# Patient Record
Sex: Male | Born: 1935 | Race: White | Hispanic: No | Marital: Married | Smoking: Former smoker
Health system: Southern US, Community
[De-identification: ages and names within clinical notes are randomized; demographics above are authoritative.]

## PROBLEM LIST (undated history)

## (undated) DIAGNOSIS — E039 Hypothyroidism, unspecified: Secondary | ICD-10-CM

## (undated) DIAGNOSIS — I509 Heart failure, unspecified: Secondary | ICD-10-CM

## (undated) DIAGNOSIS — I498 Other specified cardiac arrhythmias: Secondary | ICD-10-CM

## (undated) DIAGNOSIS — I951 Orthostatic hypotension: Secondary | ICD-10-CM

## (undated) DIAGNOSIS — J189 Pneumonia, unspecified organism: Secondary | ICD-10-CM

## (undated) DIAGNOSIS — I10 Essential (primary) hypertension: Secondary | ICD-10-CM

## (undated) DIAGNOSIS — E0590 Thyrotoxicosis, unspecified without thyrotoxic crisis or storm: Secondary | ICD-10-CM

## (undated) DIAGNOSIS — N471 Phimosis: Secondary | ICD-10-CM

## (undated) DIAGNOSIS — Z7901 Long term (current) use of anticoagulants: Secondary | ICD-10-CM

## (undated) DIAGNOSIS — H409 Unspecified glaucoma: Secondary | ICD-10-CM

## (undated) DIAGNOSIS — I4891 Unspecified atrial fibrillation: Secondary | ICD-10-CM

## (undated) DIAGNOSIS — M48061 Spinal stenosis, lumbar region without neurogenic claudication: Secondary | ICD-10-CM

## (undated) DIAGNOSIS — N3000 Acute cystitis without hematuria: Secondary | ICD-10-CM

## (undated) DIAGNOSIS — Z950 Presence of cardiac pacemaker: Secondary | ICD-10-CM

## (undated) DIAGNOSIS — N318 Other neuromuscular dysfunction of bladder: Secondary | ICD-10-CM

## (undated) DIAGNOSIS — N478 Other disorders of prepuce: Secondary | ICD-10-CM

## (undated) DIAGNOSIS — N189 Chronic kidney disease, unspecified: Secondary | ICD-10-CM

## (undated) DIAGNOSIS — J9811 Atelectasis: Secondary | ICD-10-CM

## (undated) DIAGNOSIS — G901 Familial dysautonomia [Riley-Day]: Secondary | ICD-10-CM

## (undated) DIAGNOSIS — M869 Osteomyelitis, unspecified: Secondary | ICD-10-CM

## (undated) DIAGNOSIS — G4730 Sleep apnea, unspecified: Secondary | ICD-10-CM

## (undated) HISTORY — DX: Unspecified atrial fibrillation: I48.91

## (undated) HISTORY — PX: APPENDECTOMY: SHX54

## (undated) HISTORY — DX: Acute cystitis without hematuria: N30.00

## (undated) HISTORY — DX: Hypothyroidism, unspecified: E03.9

## (undated) HISTORY — DX: Unspecified glaucoma: H40.9

## (undated) HISTORY — DX: Atelectasis: J98.11

## (undated) HISTORY — DX: Orthostatic hypotension: I95.1

## (undated) HISTORY — DX: Other neuromuscular dysfunction of bladder: N31.8

## (undated) HISTORY — DX: Phimosis: N47.1

## (undated) HISTORY — PX: TONSILLECTOMY AND ADENOIDECTOMY: SUR1326

## (undated) HISTORY — DX: Sleep apnea, unspecified: G47.30

## (undated) HISTORY — DX: Other disorders of prepuce: N47.8

## (undated) HISTORY — DX: Familial dysautonomia (riley-day): G90.1

## (undated) HISTORY — DX: Presence of cardiac pacemaker: Z95.0

## (undated) HISTORY — DX: Thyrotoxicosis, unspecified without thyrotoxic crisis or storm: E05.90

## (undated) HISTORY — PX: INGUINAL HERNIA REPAIR: SUR1180

## (undated) HISTORY — PX: TOE AMPUTATION: SHX809

## (undated) HISTORY — DX: Long term (current) use of anticoagulants: Z79.01

## (undated) HISTORY — DX: Essential (primary) hypertension: I10

---

## 1997-11-09 ENCOUNTER — Inpatient Hospital Stay (HOSPITAL_COMMUNITY): Admission: EM | Admit: 1997-11-09 | Discharge: 1997-11-12 | Payer: Self-pay | Admitting: Emergency Medicine

## 1998-01-13 ENCOUNTER — Other Ambulatory Visit: Admission: RE | Admit: 1998-01-13 | Discharge: 1998-01-13 | Payer: Self-pay | Admitting: Cardiology

## 2001-04-25 ENCOUNTER — Encounter: Payer: Self-pay | Admitting: Cardiology

## 2001-04-25 ENCOUNTER — Encounter: Admission: RE | Admit: 2001-04-25 | Discharge: 2001-04-25 | Payer: Self-pay | Admitting: Cardiology

## 2002-04-27 ENCOUNTER — Ambulatory Visit (HOSPITAL_BASED_OUTPATIENT_CLINIC_OR_DEPARTMENT_OTHER): Admission: RE | Admit: 2002-04-27 | Discharge: 2002-04-27 | Payer: Self-pay | Admitting: Internal Medicine

## 2002-07-02 ENCOUNTER — Inpatient Hospital Stay (HOSPITAL_COMMUNITY): Admission: AD | Admit: 2002-07-02 | Discharge: 2002-07-05 | Payer: Self-pay | Admitting: Internal Medicine

## 2002-07-03 ENCOUNTER — Encounter: Payer: Self-pay | Admitting: Internal Medicine

## 2002-07-03 HISTORY — PX: CARDIAC CATHETERIZATION: SHX172

## 2002-07-05 ENCOUNTER — Encounter: Payer: Self-pay | Admitting: Internal Medicine

## 2002-09-24 ENCOUNTER — Ambulatory Visit (HOSPITAL_COMMUNITY): Admission: RE | Admit: 2002-09-24 | Discharge: 2002-09-24 | Payer: Self-pay | Admitting: Internal Medicine

## 2002-12-03 ENCOUNTER — Emergency Department (HOSPITAL_COMMUNITY): Admission: EM | Admit: 2002-12-03 | Discharge: 2002-12-03 | Payer: Self-pay | Admitting: Emergency Medicine

## 2002-12-11 ENCOUNTER — Encounter: Payer: Self-pay | Admitting: Orthopaedic Surgery

## 2002-12-11 ENCOUNTER — Encounter: Admission: RE | Admit: 2002-12-11 | Discharge: 2002-12-11 | Payer: Self-pay | Admitting: Orthopaedic Surgery

## 2002-12-12 ENCOUNTER — Encounter: Admission: RE | Admit: 2002-12-12 | Discharge: 2002-12-12 | Payer: Self-pay | Admitting: Orthopaedic Surgery

## 2002-12-12 ENCOUNTER — Encounter: Payer: Self-pay | Admitting: Orthopaedic Surgery

## 2003-09-24 ENCOUNTER — Encounter (HOSPITAL_COMMUNITY): Admission: RE | Admit: 2003-09-24 | Discharge: 2003-12-23 | Payer: Self-pay | Admitting: Cardiology

## 2003-12-16 ENCOUNTER — Emergency Department (HOSPITAL_COMMUNITY): Admission: EM | Admit: 2003-12-16 | Discharge: 2003-12-17 | Payer: Self-pay | Admitting: Emergency Medicine

## 2003-12-19 ENCOUNTER — Ambulatory Visit (HOSPITAL_COMMUNITY): Admission: RE | Admit: 2003-12-19 | Discharge: 2003-12-19 | Payer: Self-pay | Admitting: Internal Medicine

## 2004-05-07 ENCOUNTER — Encounter: Admission: RE | Admit: 2004-05-07 | Discharge: 2004-05-07 | Payer: Self-pay | Admitting: Sports Medicine

## 2004-07-28 ENCOUNTER — Ambulatory Visit: Payer: Self-pay | Admitting: Internal Medicine

## 2004-10-06 ENCOUNTER — Encounter: Admission: RE | Admit: 2004-10-06 | Discharge: 2005-01-04 | Payer: Self-pay | Admitting: Otolaryngology

## 2004-11-19 ENCOUNTER — Ambulatory Visit: Payer: Self-pay | Admitting: Internal Medicine

## 2005-01-07 HISTORY — PX: CATARACT EXTRACTION W/ INTRAOCULAR LENS  IMPLANT, BILATERAL: SHX1307

## 2005-01-27 ENCOUNTER — Inpatient Hospital Stay (HOSPITAL_COMMUNITY): Admission: AD | Admit: 2005-01-27 | Discharge: 2005-01-29 | Payer: Self-pay | Admitting: Cardiology

## 2005-01-28 ENCOUNTER — Encounter (INDEPENDENT_AMBULATORY_CARE_PROVIDER_SITE_OTHER): Payer: Self-pay | Admitting: *Deleted

## 2005-05-25 ENCOUNTER — Ambulatory Visit: Payer: Self-pay | Admitting: Internal Medicine

## 2005-07-13 ENCOUNTER — Emergency Department (HOSPITAL_COMMUNITY): Admission: EM | Admit: 2005-07-13 | Discharge: 2005-07-13 | Payer: Self-pay | Admitting: Family Medicine

## 2005-08-27 ENCOUNTER — Ambulatory Visit: Payer: Self-pay | Admitting: Internal Medicine

## 2005-09-07 ENCOUNTER — Ambulatory Visit (HOSPITAL_BASED_OUTPATIENT_CLINIC_OR_DEPARTMENT_OTHER): Admission: RE | Admit: 2005-09-07 | Discharge: 2005-09-07 | Payer: Self-pay | Admitting: Internal Medicine

## 2005-09-07 ENCOUNTER — Ambulatory Visit: Payer: Self-pay | Admitting: Cardiology

## 2005-09-12 ENCOUNTER — Ambulatory Visit: Payer: Self-pay | Admitting: Internal Medicine

## 2005-09-27 ENCOUNTER — Ambulatory Visit: Payer: Self-pay | Admitting: Internal Medicine

## 2005-09-28 ENCOUNTER — Ambulatory Visit: Payer: Self-pay | Admitting: Internal Medicine

## 2005-10-26 ENCOUNTER — Ambulatory Visit: Payer: Self-pay | Admitting: Internal Medicine

## 2005-11-23 ENCOUNTER — Ambulatory Visit: Payer: Self-pay | Admitting: Internal Medicine

## 2005-12-07 ENCOUNTER — Ambulatory Visit: Payer: Self-pay | Admitting: Internal Medicine

## 2006-02-21 ENCOUNTER — Ambulatory Visit: Payer: Self-pay | Admitting: Internal Medicine

## 2006-04-07 ENCOUNTER — Ambulatory Visit (HOSPITAL_BASED_OUTPATIENT_CLINIC_OR_DEPARTMENT_OTHER): Admission: RE | Admit: 2006-04-07 | Discharge: 2006-04-07 | Payer: Self-pay | Admitting: Orthopedic Surgery

## 2006-04-07 HISTORY — PX: ELBOW BURSA SURGERY: SHX615

## 2006-06-28 ENCOUNTER — Ambulatory Visit: Payer: Self-pay | Admitting: Internal Medicine

## 2006-07-29 ENCOUNTER — Encounter: Admission: RE | Admit: 2006-07-29 | Discharge: 2006-09-12 | Payer: Self-pay | Admitting: Cardiology

## 2006-12-26 ENCOUNTER — Ambulatory Visit: Payer: Self-pay | Admitting: Internal Medicine

## 2007-04-20 ENCOUNTER — Ambulatory Visit: Payer: Self-pay | Admitting: Internal Medicine

## 2007-07-26 ENCOUNTER — Ambulatory Visit (HOSPITAL_COMMUNITY): Admission: RE | Admit: 2007-07-26 | Discharge: 2007-07-27 | Payer: Self-pay | Admitting: Orthopedic Surgery

## 2007-07-26 ENCOUNTER — Encounter (INDEPENDENT_AMBULATORY_CARE_PROVIDER_SITE_OTHER): Payer: Self-pay | Admitting: Orthopedic Surgery

## 2007-11-24 ENCOUNTER — Ambulatory Visit: Payer: Self-pay | Admitting: Internal Medicine

## 2007-11-24 DIAGNOSIS — J209 Acute bronchitis, unspecified: Secondary | ICD-10-CM

## 2007-11-24 DIAGNOSIS — J9819 Other pulmonary collapse: Secondary | ICD-10-CM

## 2007-11-24 DIAGNOSIS — G4733 Obstructive sleep apnea (adult) (pediatric): Secondary | ICD-10-CM | POA: Insufficient documentation

## 2007-11-28 ENCOUNTER — Encounter: Payer: Self-pay | Admitting: Internal Medicine

## 2007-12-07 ENCOUNTER — Ambulatory Visit: Payer: Self-pay | Admitting: Internal Medicine

## 2008-01-25 ENCOUNTER — Ambulatory Visit (HOSPITAL_COMMUNITY): Admission: RE | Admit: 2008-01-25 | Discharge: 2008-01-25 | Payer: Self-pay | Admitting: Neurological Surgery

## 2008-06-05 ENCOUNTER — Ambulatory Visit: Payer: Self-pay

## 2008-11-28 ENCOUNTER — Encounter (INDEPENDENT_AMBULATORY_CARE_PROVIDER_SITE_OTHER): Payer: Self-pay | Admitting: *Deleted

## 2008-12-03 DIAGNOSIS — I4891 Unspecified atrial fibrillation: Secondary | ICD-10-CM | POA: Insufficient documentation

## 2008-12-03 DIAGNOSIS — Q078 Other specified congenital malformations of nervous system: Secondary | ICD-10-CM

## 2008-12-03 DIAGNOSIS — I959 Hypotension, unspecified: Secondary | ICD-10-CM | POA: Insufficient documentation

## 2008-12-03 DIAGNOSIS — I495 Sick sinus syndrome: Secondary | ICD-10-CM | POA: Insufficient documentation

## 2008-12-03 DIAGNOSIS — Z950 Presence of cardiac pacemaker: Secondary | ICD-10-CM

## 2008-12-31 ENCOUNTER — Ambulatory Visit: Payer: Self-pay | Admitting: Internal Medicine

## 2009-05-30 ENCOUNTER — Telehealth: Payer: Self-pay | Admitting: Internal Medicine

## 2009-06-12 ENCOUNTER — Encounter: Admission: RE | Admit: 2009-06-12 | Discharge: 2009-06-12 | Payer: Self-pay | Admitting: Specialist

## 2009-06-17 ENCOUNTER — Ambulatory Visit: Payer: Self-pay | Admitting: Internal Medicine

## 2009-06-23 ENCOUNTER — Telehealth: Payer: Self-pay | Admitting: Internal Medicine

## 2010-02-03 ENCOUNTER — Ambulatory Visit: Payer: Self-pay | Admitting: Internal Medicine

## 2010-02-04 ENCOUNTER — Encounter: Payer: Self-pay | Admitting: Internal Medicine

## 2010-03-30 ENCOUNTER — Ambulatory Visit: Payer: Self-pay | Admitting: Cardiology

## 2010-04-14 ENCOUNTER — Ambulatory Visit (HOSPITAL_COMMUNITY): Admission: RE | Admit: 2010-04-14 | Discharge: 2010-04-14 | Payer: Self-pay | Admitting: Neurological Surgery

## 2010-05-05 ENCOUNTER — Ambulatory Visit: Payer: Self-pay | Admitting: Cardiology

## 2010-05-19 ENCOUNTER — Ambulatory Visit: Payer: Self-pay | Admitting: Cardiology

## 2010-06-03 ENCOUNTER — Ambulatory Visit: Payer: Self-pay | Admitting: Cardiology

## 2010-06-05 ENCOUNTER — Encounter (INDEPENDENT_AMBULATORY_CARE_PROVIDER_SITE_OTHER): Payer: Self-pay | Admitting: *Deleted

## 2010-06-17 ENCOUNTER — Ambulatory Visit: Payer: Self-pay | Admitting: Cardiology

## 2010-07-06 ENCOUNTER — Ambulatory Visit: Payer: Self-pay | Admitting: Cardiology

## 2010-07-13 ENCOUNTER — Ambulatory Visit: Payer: Self-pay | Admitting: Internal Medicine

## 2010-07-14 ENCOUNTER — Emergency Department (HOSPITAL_COMMUNITY)
Admission: EM | Admit: 2010-07-14 | Discharge: 2010-07-14 | Payer: Self-pay | Source: Home / Self Care | Admitting: Emergency Medicine

## 2010-09-08 ENCOUNTER — Ambulatory Visit: Payer: Self-pay | Admitting: Cardiology

## 2010-09-08 NOTE — Procedures (Signed)
Summary: 6 month pacer ck   Current Medications (verified): 1)  Coumadin .... As Directed 2)  Synthroid 175 Mcg  Tabs (Levothyroxine Sodium) .... Take 1 By Mouth Once Daily 3)  Tylenol Extra Strength 500 Mg  Tabs (Acetaminophen) .... As Needed 4)  Midodrine Hcl 10 Mg Tabs (Midodrine Hcl) .... Take One Tablet Four Times A Day 5)  Fludrocortisone Acetate 0.1 Mg Tabs (Fludrocortisone Acetate) .... Take One Tablet Two Times A Day On Mon, Wed, Fri.  Take One Tablet All Other Days 6)  Lyrica 75 Mg Caps (Pregabalin) .... Take One Capsule Once Daily 7)  Digoxin 0.125 Mg Tabs (Digoxin) .... Take 1 Tablet By Mouth Once Daily  Allergies (verified): No Known Drug Allergies   PPM Specifications Following MD:  Sherryl Manges, MD     PPM Vendor:  Pacesetter     PPM Model Number:  [id number]     PPM Serial Number:  [id number] DOI:  07/04/2002     PPM Implanting MD:  Sherryl Manges, MD  Lead 1    Location: RA     DOI: 07/04/2002     Model #: 1688TC     Serial #: [id number]     Status: active Lead 2    Location: RV     DOI: 07/04/2002     Model #: 1688TC     Serial #: [id number]     Status: active   Indications:  Tachy Brady Syndrome; PAF   PPM Follow Up Battery Voltage:  2.67 V     Battery Est. Longevity:  0.75-1.25 yrs     Pacer Dependent:  No       PPM Device Measurements Atrium  Amplitude: 2.6 mV, Impedance: 407 ohms, Threshold: 1.75 V at 0.5 msec Right Ventricle  Amplitude: 16.6 mV, Impedance: 382 ohms, Threshold: 0.625 V at 0.5 msec  Episodes MS Episodes:  1     Percent Mode Switch:  >99%     Coumadin:  Yes Ventricular High Rate:  0     Atrial Pacing:  50%     Ventricular Pacing:  100%  Parameters Mode:  DDDR     Lower Rate Limit:  70     Upper Rate Limit:  120 Paced AV Delay:  300     Sensed AV Delay:  225 Next Cardiology Appt Due:  01/08/2011 Tech Comments:  PT IN AF >99% OF TIME. +COUMADIN. NORMAL DEVICE FUNCTION.  CHANGED ER SENSITIVITY FROM 4.7 TO 2.80mV.  ROV IN 6 MTHS W/SK. Vella Kohler   July 13, 2010 3:37 PM

## 2010-09-08 NOTE — Assessment & Plan Note (Signed)
Summary: pc2 per pt call/lg   Referring Provider:  Cassell Clement  CC:  pc2.  History of Present Illness: Mr. 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  He has paroxysmal fibrillation and is holding sinus rhythm. He was previously on amiodarone but we discontinued this at his last visit because of the concerns that might be contributing to his autonomic neuropathy. in the interim he is also seeing Dr. love who is further up titrated his ProAmatine and Florinef with significant overall improvement     Current Medications (verified): 1)  Coumadin .... As Directed 2)  Synthroid 175 Mcg  Tabs (Levothyroxine Sodium) .... Take 1 By Mouth Once Daily 3)  Tylenol Extra Strength 500 Mg  Tabs (Acetaminophen) .... As Needed 4)  Midodrine Hcl 10 Mg Tabs (Midodrine Hcl) .... Take One Tablet Four Times A Day 5)  Fludrocortisone Acetate 0.1 Mg Tabs (Fludrocortisone Acetate) .... Take One Tablet Two Times A Day On Mon, Wed, Fri.  Take One Tablet All Other Days 6)  Lyrica 75 Mg Caps (Pregabalin) .... Take One Capsule Once Daily  Allergies (verified): No Known Drug Allergies  Past History:  Past Medical History: Last updated: 12/03/2008 PACEMAKER (ICD-V45.[name]01) ATRIAL FIBRILLATION (ICD-427.31) SICK SINUS/ TACHY-BRADY SYNDROME (ICD-427.81) HYPERTENSION, UNSPECIFIED (ICD-401.9) ORTHOSTATIC DIZZINESS (ICD-780.4) OBSTRUCTIVE SLEEP APNEA (ICD-327.23) ATELECTASIS (ICD-518.0) Hx of ASTHMATIC BRONCHITIS, ACUTE (ICD-466.0) * REM BEHAVIOR DISORDER DYSAUTONOMIA (ICD-742.8)    Past Surgical History: Last updated: 12/03/2008 eye surgery 01/2005 pacemaker - 07/04/02 [name] - tachy-brady syndrome with paroxysmal atrial fibrillation. Right foot second ray amputation  Family History: Last updated: 12/03/2008 Family History of Cancer:   Social History: Last updated: 12/03/2008 Patient states former smoker.  - 89 Retired  Married  Alcohol  Use - yes - very little  Vital Signs:  Patient profile:   77 year old male Height:      75 inches Weight:      227 pounds BMI:     28.48 Pulse rate:   71 / minute Pulse rhythm:   regular BP sitting:   130 / 78  (left arm) Cuff size:   regular  Vitals Entered By: Judithe Modest CMA (February 03, 2010 11:18 AM)  Physical Exam  General:  The patient was alert and oriented in no acute distress.Neck veins were flat, carotids were brisk. Lungs were clear. Heart sounds were irregular without murmurs or gallops. Abdomen was soft with active bowel sounds. There is no clubbing cyanosis or edema. He is walking today with lateral   PPM Specifications Following MD:  Sherryl Manges, MD     PPM Vendor:  Pacesetter     PPM Model Number:  [id number]     PPM Serial Number:  [id number] PPM DOI:  07/04/2002     PPM Implanting MD:  Sherryl Manges, MD  Lead 1    Location: RA     DOI: 07/04/2002     Model #: 1688TC     Serial #: [id number]     Status: active Lead 2    Location: RV     DOI: 07/04/2002     Model #: 1688TC     Serial #: [id number]     Status: active   Indications:  Tachy Brady Syndrome; PAF   PPM Follow Up Battery Voltage:  2.73 V     Battery Est. Longevity:  1-1.75 yrs     Pacer Dependent:  No       PPM Device Measurements Atrium  Amplitude: .9  mV, Impedance: 419 ohms,  Right Ventricle  Amplitude: 16.6 mV, Impedance: 363 ohms, Threshold: 0.625 V at 0.5 msec  Episodes MS Episodes:  6     Percent Mode Switch:  55%     Coumadin:  Yes Ventricular High Rate:  0     Atrial Pacing:  >99%     Ventricular Pacing:  1.0%  Parameters Mode:  DDDR     Lower Rate Limit:  70     Upper Rate Limit:  120 Paced AV Delay:  300     Sensed AV Delay:  225 Next Cardiology Appt Due:  07/09/2010 Tech Comments:  PT IN MODE SWITCH SINCE 09-28-09. + COUMADIN. NORMAL DEVICE FUNCTION.  CHANGES: A SENSITIVITY FROM 0.5 TO 0.61mV AND RV AMPLITUDE FROM 0.625 TO 0.875 V.  ROV IN 6 MTHS W/DEVICE CLINIC.  Vella Kohler  February 03, 2010 11:40  AM  Impression & Recommendations:  Problem # 1:  DYSAUTONOMIA (ICD-742.8) much improved off the amiodarone and with up titrated medications.. this is the best he has doenn in  a long time  Problem # 2:  ORTHOSTATIC DIZZINESS (ICD-780.4) as above  Problem # 3:  ATRIAL FIBRILLATION (ICD-427.31) paroxysmal and now persistent. He is tolerated well. Antiarrhythmic therapy will not be pursued  Problem # 4:  PACEMAKER (ICD-V45.[name]01) .Device parameters and data were reviewed and no changes were made

## 2010-09-08 NOTE — Miscellaneous (Signed)
Summary: dx correction  Clinical Lists Changes  Problems: Changed problem from PACEMAKER (ICD-V45.[name]01) to PACEMAKER, PERMANENT (ICD-V45.01)  changed the incorrect dx code to correct dx code Genella Mech  June 05, 2010 1:40 PM

## 2010-09-08 NOTE — Cardiovascular Report (Signed)
Summary: Office Visit   Office Visit   Imported By: Roderic Ovens 02/04/2010 12:26:49  _____________________________________________________________________  External Attachment:    Type:   Image     Comment:   External Document

## 2010-09-10 NOTE — Cardiovascular Report (Signed)
Summary: Office Visit   Office Visit   Imported By: Roderic Ovens 07/27/2010 16:20:28  _____________________________________________________________________  External Attachment:    Type:   Image     Comment:   External Document

## 2010-09-16 ENCOUNTER — Emergency Department (HOSPITAL_COMMUNITY)
Admission: EM | Admit: 2010-09-16 | Discharge: 2010-09-17 | Disposition: A | Discharge status: Home / Self Care | Payer: Medicare Other | Attending: Emergency Medicine | Admitting: Emergency Medicine

## 2010-09-16 ENCOUNTER — Emergency Department (HOSPITAL_COMMUNITY): Payer: Medicare Other

## 2010-09-16 DIAGNOSIS — R143 Flatulence: Secondary | ICD-10-CM | POA: Insufficient documentation

## 2010-09-16 DIAGNOSIS — I4891 Unspecified atrial fibrillation: Secondary | ICD-10-CM | POA: Insufficient documentation

## 2010-09-16 DIAGNOSIS — Z7901 Long term (current) use of anticoagulants: Secondary | ICD-10-CM | POA: Insufficient documentation

## 2010-09-16 DIAGNOSIS — N3943 Post-void dribbling: Secondary | ICD-10-CM | POA: Insufficient documentation

## 2010-09-16 DIAGNOSIS — H9190 Unspecified hearing loss, unspecified ear: Secondary | ICD-10-CM | POA: Insufficient documentation

## 2010-09-16 DIAGNOSIS — Z79899 Other long term (current) drug therapy: Secondary | ICD-10-CM | POA: Insufficient documentation

## 2010-09-16 DIAGNOSIS — R142 Eructation: Secondary | ICD-10-CM | POA: Insufficient documentation

## 2010-09-16 DIAGNOSIS — E039 Hypothyroidism, unspecified: Secondary | ICD-10-CM | POA: Insufficient documentation

## 2010-09-16 DIAGNOSIS — R109 Unspecified abdominal pain: Secondary | ICD-10-CM | POA: Insufficient documentation

## 2010-09-16 DIAGNOSIS — R339 Retention of urine, unspecified: Secondary | ICD-10-CM | POA: Insufficient documentation

## 2010-09-16 DIAGNOSIS — Z950 Presence of cardiac pacemaker: Secondary | ICD-10-CM | POA: Insufficient documentation

## 2010-09-16 DIAGNOSIS — R141 Gas pain: Secondary | ICD-10-CM | POA: Insufficient documentation

## 2010-09-16 DIAGNOSIS — K5900 Constipation, unspecified: Secondary | ICD-10-CM | POA: Insufficient documentation

## 2010-09-16 LAB — BASIC METABOLIC PANEL
BUN: 18 mg/dL (ref 6–23)
Creatinine, Ser: 1.32 mg/dL (ref 0.4–1.5)
GFR calc Af Amer: >60 mL/min (ref >60)

## 2010-09-16 LAB — DIFFERENTIAL
Basophils Relative: 0 % (ref 0–1)
Eosinophils Absolute: 0.1 10*3/uL (ref 0.0–0.7)
Lymphs Abs: 1.7 10*3/uL (ref 0.7–4.0)
Monocytes Absolute: 1.1 10*3/uL — ABNORMAL HIGH (ref 0.1–1.0)
Neutrophils Relative %: 73 % (ref 43–77)

## 2010-09-16 LAB — URINE MICROSCOPIC-ADD ON

## 2010-09-16 LAB — CBC
Hemoglobin: 14.7 g/dL (ref 13.0–17.0)
MCH: 30.7 pg (ref 26.0–34.0)
MCHC: 33.1 g/dL (ref 30.0–36.0)

## 2010-09-16 LAB — URINALYSIS, ROUTINE W REFLEX MICROSCOPIC
Leukocytes, UA: NEGATIVE
Specific Gravity, Urine: 1.015 (ref 1.005–1.030)

## 2010-09-16 LAB — PROTIME-INR: Prothrombin Time: 36.4 seconds — ABNORMAL HIGH (ref 11.6–15.2)

## 2010-09-18 ENCOUNTER — Telehealth (INDEPENDENT_AMBULATORY_CARE_PROVIDER_SITE_OTHER): Payer: Self-pay | Admitting: *Deleted

## 2010-09-24 NOTE — Progress Notes (Addendum)
Summary: Coumadin Question   Phone Note Call from Patient Call back at Home Phone [phone]   Reason for Call: Talk to Doctor Summary of Call: Returned call from pt's wife concerning rectal bleeding.  Pt states he had a BM today and began to have BRBPR.  It was not painful.  He has not had this before.  He is still bleeding some but says it has stopped almost completely.  Pt states he did strain during his BM.  Of note, he also had a foley catheter placed today but there is no sign of blood in his urine output.  Pts wife states he bled atleast 1/2 a cup.  Pt was not truly concerned as he says it is easing.  Pt's wife wanted to know about coumadin use.  He says his last INR was  ~2 weeks ago and was 2.  He is due for another check this week.  I have informed the wife that the pt can hold his coumadin dose tonight and restart tomorrow if bleeding has stopped.  If the bleeding has not stopped the pateitn should go to the ER for evaluation.  They voiced understanding and appreciated the call back.   Initial call taken by: Robbi Garter NP-PA,  September 18, 2010 7:51 PM

## 2010-09-30 ENCOUNTER — Other Ambulatory Visit (INDEPENDENT_AMBULATORY_CARE_PROVIDER_SITE_OTHER): Payer: Medicare Other

## 2010-09-30 DIAGNOSIS — Z7901 Long term (current) use of anticoagulants: Secondary | ICD-10-CM

## 2010-09-30 DIAGNOSIS — I4891 Unspecified atrial fibrillation: Secondary | ICD-10-CM

## 2010-10-15 ENCOUNTER — Encounter (INDEPENDENT_AMBULATORY_CARE_PROVIDER_SITE_OTHER): Payer: Medicare Other

## 2010-10-15 DIAGNOSIS — I4891 Unspecified atrial fibrillation: Secondary | ICD-10-CM

## 2010-10-15 DIAGNOSIS — Z7901 Long term (current) use of anticoagulants: Secondary | ICD-10-CM

## 2010-10-30 ENCOUNTER — Other Ambulatory Visit (INDEPENDENT_AMBULATORY_CARE_PROVIDER_SITE_OTHER): Payer: Medicare Other | Admitting: *Deleted

## 2010-10-30 DIAGNOSIS — I4891 Unspecified atrial fibrillation: Secondary | ICD-10-CM

## 2010-10-30 DIAGNOSIS — Z7901 Long term (current) use of anticoagulants: Secondary | ICD-10-CM

## 2010-10-30 LAB — POCT INR: INR: 3.2

## 2010-12-01 ENCOUNTER — Other Ambulatory Visit: Payer: Self-pay | Admitting: Cardiology

## 2010-12-01 DIAGNOSIS — R52 Pain, unspecified: Secondary | ICD-10-CM

## 2010-12-01 NOTE — Telephone Encounter (Signed)
Wanting a refill on vicodin 5/500 1 Q4hrs prn, 30 day supply sent to [facility].  Is this ok?  Any refills?

## 2010-12-01 NOTE — Telephone Encounter (Signed)
CALL PT REGARDING HIS MEDS, PLACED CHART IN BOX.

## 2010-12-02 MED ORDER — HYDROCODONE-ACETAMINOPHEN 5-500 MG PO TABS: 1.0000 | ORAL_TABLET | ORAL | Status: AC | PRN

## 2010-12-02 NOTE — Telephone Encounter (Signed)
OK to refill

## 2010-12-02 NOTE — Telephone Encounter (Signed)
Patient called requesting rx to be refilled. Called to [facility]

## 2010-12-04 ENCOUNTER — Ambulatory Visit (INDEPENDENT_AMBULATORY_CARE_PROVIDER_SITE_OTHER): Payer: Medicare Other | Admitting: *Deleted

## 2010-12-04 DIAGNOSIS — I4891 Unspecified atrial fibrillation: Secondary | ICD-10-CM

## 2010-12-04 LAB — POCT INR: INR: 2.5

## 2010-12-22 NOTE — Assessment & Plan Note (Signed)
[facility]                         ELECTROPHYSIOLOGY OFFICE NOTE   ACHILLES, NEVILLE                 MRN:          [id number]  DATE:12/07/2007                            DOB:          23-Oct-1920    HISTORY OF PRESENT ILLNESS:  Mr.  Marco Gregory is seen for tachybrady  syndrome.  He has paroxysmal atrial fibrillation for which he takes  Coumadin and is status post pacemaker implantation.  He also has  orthostatic intolerance which remains disabling at times and  unresponsive in his mind to therapeutic interventions, including  Midodrine and Florinef and attempts to use thigh-high stockings which  keep following down.   MEDICATIONS:  His medications are as noted above.  1. Amiodarone dose is 100 mg a day.  2. Coumadin.  3. Synthroid.   PHYSICAL EXAMINATION:  VITAL SIGNS:  Blood pressure 101/61 with pulse  was 74.  His weight was 233 which is down about 15 pounds over the last  year.  LUNGS:  Clear.  HEART:  Sounds were regular.  EXTREMITIES:  Without edema.   Interrogation of his St. Jude pulse generator demonstrates a P-wave of  two with impedance of 435 a threshold 0.7 and 0.5 with an R-wave of  16.6, impedance of 403, a threshold 0.625 at 0.5.  Battery voltage 2.75.  He is atrially paced 100% of the time and ventricularly paced not at  all.  There are no episodes of atrial fibrillation.   IMPRESSION:  1. Paroxysmal atrial fibrillation.  2. Bradycardia status post pacemaker implantation.  3. Amiodarone therapy for the above.  4. Dysautonomia with orthostatic intolerance.   Mr. Marco Gregory is having severe problems with back pain.  He continuous  to have problems with orthostatic intolerance.  His thigh-high stockings  are not working.  I suggest that he go back to the healthcare supply  store and see if they could replace him with waist-highs with a little  bit more pressure.  We will be glad to provide a script for that.   He is  also complaining of near syncope in the shower and I suggested  that he get a shower chair and use that.  The shower as well as well as  early morning might both be contributing to the poor tolerance.  I have  also told that he had increase his midodrine up to 20 mg up to 20 mg  three times a day if necessary for particularly bad symptoms.  It may  well also be that with his relatively low blood pressure, increasing  dose of his Florinef would be feasible.   We will see him again in his six months time.     Duke Salvia, MD, [facility]  Electronically Signed    SCK/MedQ  DD: 12/07/2007  DT: 12/07/2007  Job #: [id number]   cc:   Cassell Clement, M.D.

## 2010-12-22 NOTE — Letter (Signed)
April 20, 2007    Cassell Clement, M.D.  [address]  [place], [place] [address]   RE:  Marco, Gregory  MRN:  [id number]  /  DOB:  03/04/1921   Dear Elijah Birk:   Marco Gregory came in today.  As you know, he has his pacemaker  implanted for bradycardia.  He has the dysautonomia.  He has developed  severe limiting back pain and cervical spine pain.  He has gone to a  variety of orthopedists, chiropractors and I gather, also to the  [facility] and remains debilitated from this.   CURRENT MEDICATIONS:  1. Amiodarone 100 a day.  2. Synthroid 175 mcg.  3. Coumadin.   PHYSICAL EXAMINATION:  VITAL SIGNS:  Blood pressure was quite low at  86/60 with a pulse of 70.  LUNGS:  Clear.  HEART:  Regular.  EXTREMITIES:  1+ edema.   Interrogation of his St. Jude pacemaker demonstrates a P-wave of 1.6  with impedance of 455, threshold of 0.75 and 0.4.  R-wave of 16.6 with  impedance of 446 with threshold 0.625 and 0.5.  Battery voltage is 2.76.   IMPRESSION:  1. Tachycardia-bradycardia syndrome with paroxysmal atrial      fibrillation, holding sinus rhythm on amiodarone.  2. Status post pacemaker.  3. Orthostatic hypertension.  4. Severe back pain.   Marco Gregory has got a variety of significant symptoms in relation to  his back pain.  I told him there is data demonstrating the safety of MRI  in patient's with pacemakers, particularly limited to the TLS spine, and  so that if his orthopedic evaluation requires that to understand what  the cause of this limiting pain is, that could be pursued.   He asked me specifically about people who might be able to help him with  his back and I suggested he talk to you because I really do not have a  good sense of that.   If I can be of any assistance, please do not hesitate to contact me.    Sincerely,      Duke Salvia, MD, [facility]  Electronically Signed    SCK/MedQ  DD: 04/20/2007  DT: 04/21/2007  Job #:  [id number]   CC:    Cassell Clement, M.D.

## 2010-12-22 NOTE — Op Note (Signed)
NAME:  Marco Gregory, Marco Gregory          ACCOUNT NO.:  [id number]   MEDICAL RECORD NO.:  [id number]          PATIENT TYPE:  AMB   LOCATION:  SDS                          FACILITY:  [facility]   PHYSICIAN:  Nadara Mustard, MD     DATE OF BIRTH:  24-Aug-1920   DATE OF PROCEDURE:  07/26/2007  DATE OF DISCHARGE:                               OPERATIVE REPORT   PREOPERATIVE DIAGNOSIS:  Osteomyelitis, right foot second toe.   POSTOPERATIVE DIAGNOSIS:  Osteomyelitis, right foot second toe.   PROCEDURE:  Right foot second ray amputation.   SURGEON:  Nadara Mustard, M.D.   ANESTHESIA:  Right ankle block.   ESTIMATED BLOOD LOSS:  Minimal.   ANTIBIOTICS:  1 gram of Kefzol.   DRAINS:  None.   COMPLICATIONS:  None.   TOURNIQUET TIME:  None.   DISPOSITION:  To the PACU in stable condition.   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  No definite diagnosis of  diabetes.  He has a palpable pulse and has ulceration with a Wagner  grade 3 ulcer over the PIP joint of the right second toe with  radiographic findings of osteomyelitis of the PIP joint.  Due to failure  of conservative care, cellulitis, sausage digit, swelling of the toe,  the patient presents at this time for second ray amputation.  The risks  and benefits were discussed including infection, neurovascular injury,  nonhealing of the wound, need for higher level amputation.  The patient  states he understands and wishes to proceed at this time.   DESCRIPTION OF PROCEDURE:  The patient was brought to OR room 2 after  undergoing an ankle block.  After an adequate level of anesthesia was  obtained, the patient's right lower extremity was prepped using DuraPrep  and draped into a sterile field.  A dorsal incision was made in a racket  type incision to resect the metatarsal and toe in one block of tissue.  There were no plantar incisions made. The metatarsal was resected  at the  base of the metatarsal and the metatarsal and toe were resected in one  block of tissue.  The wound was irrigated with normal saline.  Electrocautery was used for hemostasis.  The wound was closed with a  modified vertical mattress suture.  There was  no tension on the skin. The wound was covered Adaptic, orthopedic  sponges, sterile Webril, and a Coban dressing.  The patient was then  taken to the PACU in stable condition.  Plan for 23 hour observation and  discharge to home in the morning.      Nadara Mustard, MD  Electronically Signed     MVD/MEDQ  D:  07/26/2007  T:  07/26/2007  Job:  [id number]

## 2010-12-24 ENCOUNTER — Other Ambulatory Visit: Payer: Self-pay | Admitting: Cardiology

## 2010-12-24 MED ORDER — MIDODRINE HCL 10 MG PO TABS: 10.0000 mg | ORAL_TABLET | Freq: Four times a day (QID) | ORAL | Status: DC

## 2010-12-24 NOTE — Telephone Encounter (Signed)
Called requesting refill.

## 2010-12-24 NOTE — Telephone Encounter (Signed)
Left message

## 2010-12-24 NOTE — Telephone Encounter (Signed)
CALL PT REGARDING MEDS. CHART IN BOX.

## 2010-12-25 NOTE — Assessment & Plan Note (Signed)
[facility]                             PULMONARY OFFICE NOTE   Marco Gregory, Marco Gregory                 MRN:          [id number]  DATE:12/27/2006                            DOB:          [birth year]    PROBLEM LIST:  1. Atelectasis.  2. Mild asthmatic bronchitis.  3. Obstructive sleep apnea.  4. Myasthenia.  5. 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  6. Atrial fibrillation/pacemaker.  7. Orthostasis and dizziness.   HISTORY:  A 6 month followup.  He continues with CPAP every night at 12  CWP and does not feel he has problems with that.  He talked again about  his primary symptom concern which is dizziness and also back pain for  which he was evaluated at [facility] with total improvement.  Breathing varies.  His main complaint is a persistent thick mucus in his  throat, sometimes yellow.  It has been more yellow in that last 2 weeks.  He does not really feel a significant nasal congestion but found that  aggressive use of a saline nasal spray made it better.  He took an  antibiotic in February which was of some help and also took Mucinex at  that time.  He thinks his last chest x-ray was 3 months ago with Dr.  Patty Sermons and fine.  I do not have access to that.  He denies  awareness of reflux, chest pain, fever, or wheeze.   MEDICATIONS:  1. Zetia 10 mg is listed as p.r.n.  2. Amiodarone 200 mg.  3. Coumadin.  4. Synthroid 175 mcg.  5. CPAP for 12 CWP.  6. Occasional Tylenol.  7. No medication allergy.   OBJECTIVE:  Weight 248 pounds which is steadily up from 244 pounds in  July 2007.  BP 104/68, pulse rapid and regular, listed as 150 by the  nurse, 112 by my count.  Room air saturation at rest 87%.  Work of  breathing is not labored.  He is not coughing.  Pharynx is somewhat  fleshy, a little blotchy and red, but I cannot really say it is acutely  irritated.  There is no visible drainage.  No stridor.  He is not  hoarse.   Work of breathing is not increased as he sits quietly.  I heard  a couple of little crackles but otherwise, chest was quiet without rales  or wheeze.  Heart sounds were approximately regular without murmur  heard.  There was no peripheral edema or cyanosis.   IMPRESSION:  His concern about thick, yellow phlegm found only in the  throat is nonspecific.  After some discussion of options and comparison  of postnasal drainage versus endobronchial sources, we have given a  course of doxycycline x7 days 100 mg, and I have instructed him in  active saline nasal lavage for trial.  He is scheduled to return with me  in 6 months, earlier p.r.n.  Keep appointments with Dr. Patty Sermons.     Clinton D. Maple Hudson, MD, [facility], FACP  Electronically Signed    CDY/MedQ  DD: 12/27/2006  DT: 12/27/2006  Job #: [id number]  cc:   Cassell Clement, M.D.

## 2010-12-25 NOTE — Op Note (Signed)
NAME:  Marco Gregory, Marco Gregory NO.:  [id number]   MEDICAL RECORD NO.:  [id number]                   PATIENT TYPE:  INP   LOCATION:  4740                                 FACILITY:  [facility]   PHYSICIAN:  Doylene Canning. [name], M.D. [facility]           DATE OF BIRTH:  May 06, 1921   DATE OF PROCEDURE:  07/04/2002  DATE OF DISCHARGE:                                 OPERATIVE REPORT   PROCEDURE PERFORMED:  Implantation of a dual chamber pacemaker.   INDICATIONS FOR PROCEDURE:  Symptomatic tachy-brady syndrome with paroxysmal  atrial fibrillation.   INTRODUCTION:  The patient is a very pleasant 77 year old man with a history  of paroxysmal atrial fibrillation which has been treated in the past with  quinidine.  In sinus rhythm, his heart rates were in the 40s.  In atrial  fibrillation his heart rates were rapid, typically between 100 and 150 beats  per minute.  He was subsequently admitted to the hospital and is referred  now for permanent pacemaker insertion.   DESCRIPTION OF PROCEDURE:  After informed consent was obtained, the patient  was taken to the diagnostic electrophysiology laboratory in a fasted state.  After the usual preparation and draping, intravenous fentanyl and Midazolam  were given for sedation.  A total of 30 cc of lidocaine was infiltrated into  the left infraclavicular region.  A 7 cm incision was carried out over this  region and electrocautery utilized to dissect down to the subpectoralis  fascia.  10 cc of contrast demonstrated a patent left subclavian vein.  It  was subsequently punctured times two and the St. Jude model 1688 TC 58 cm  active fixation lead was placed into the right ventricle and the St Jude  model 1688 TC 52 cm active fixation pacing lead was placed in the right  atrium.  The ventricular lead serial number was [id number] and the atrial lead  serial number was [id number].  Mapping was carried out in the right ventricle  and at the final site  near the RV apex R waves measured 14 mV and a pacing  threshold of 0.6 V at 0.5 ms once the lead was actively fixed.  The pacing  impedance was 664 ohms.  10 V pacing did not result in diaphragmatic  stimulation.  With the ventricular lead in satisfactory position, attention  was turned to the atrial lead.  Because of the patient's atrial fibrillation  mapping was carried out at a site where _______ waves were somewhat  increased in magnitude (1.7 to 3 mV), the lead was actively fixed.  AO  pacing demonstrated a pacing impedance in the right atrium of 492 ohms.  10  V pacing in the right atrium also did not result in diaphragmatic  stimulation.  With atrial and ventricular leads in satisfactory position  they were secured to the subpectoralis fascia with a figure-of-eight silk  suture.  In addition, the sewing sleeves were secured with  silk suture.  Electrocautery was utilized to connect the subcutaneous pocket.  Electrocautery was also utilized to ensure hemostasis.  Kanamycin irrigation  was utilized to irrigate the incision and the incision was closed with a  layer of 2-0 Vicryl, followed by a layer of 3-0 Vicryl followed by a layer  of 4-0 Vicryl.  Benzoin was painted on the skin and Steri-Strips were  applied and a pressure dressing placed and the patient returned to his room  in satisfactory condition.   COMPLICATIONS:  There were no immediate procedure complications.    RESULTS:  This demonstrates successful implantation of a St. Jude  Industrial/product designer) dual chamber pacemaker.  Of note, the pacemaker generator ws an  identity [id number] serial number [id number] in a patient with symptomatic tachy-  brady syndrome and paroxysmal atrial fibrillation.                                               Doylene Canning. [name], M.D. [facility]    GWT/MEDQ  D:  07/04/2002  T:  07/04/2002  Job:  [id number]   cc:   Cassell Clement, M.D.  [address], [address]  [place]  [place] [address]  Fax: [phone]    Duke Salvia, M.D. [facility]   [facility]

## 2010-12-25 NOTE — Op Note (Signed)
NAME:  Marco Gregory, Marco Gregory NO.:  [id number]   MEDICAL RECORD NO.:  [id number]                   PATIENT TYPE:  OIB   LOCATION:  2899                                 FACILITY:  [facility]   PHYSICIAN:  Duke Salvia, M.D.               DATE OF BIRTH:  1921-03-08   DATE OF PROCEDURE:  12/19/2003  DATE OF DISCHARGE:  12/19/2003                                 OPERATIVE REPORT   PREOPERATIVE DIAGNOSIS:  Atrial flutter - slow.   POSTOPERATIVE DIAGNOSIS:  Sinus rhythm.   PROCEDURE:  Direct current cardioversion and pacemaker interrogation.   SURGEON:  Duke Salvia, M.D.   DESCRIPTION OF PROCEDURE:  Following the obtainment of informed consent, the  patient was admitted to general anesthesia under Dr. Sampson Goon.  He  received a total of 175 mg of sodium pentothal.   Interrogation of his [employer]. Jude Identity ADXL pulse generator demonstrated  that he was in an atrial flutter with a cycle length of 300 msec.  Ventricular pacing via mode switch.  A 122 joule shock was delivered  synchronously and his atrial fibrillation terminated in atrial fibrillation  and restored an AV paced rhythm.   The patient tolerated the procedure without apparent complications.  The  patient's device was not further reprogrammed.                                               Duke Salvia, M.D.    SCK/MEDQ  D:  12/19/2003  T:  12/20/2003  Job:  [id number]   cc:   Cassell Clement, M.D.  [address], [address]  [place]  [place] [address]  Fax: [phone]   [facility]

## 2010-12-25 NOTE — Consult Note (Signed)
NAME:  AUDRA, BELLARD NO.:  [id number]   MEDICAL RECORD NO.:  [id number]          PATIENT TYPE:  INP   LOCATION:  3706                         FACILITY:  [facility]   PHYSICIAN:  Duke Salvia, M.D.  DATE OF BIRTH:  06-15-1921   DATE OF CONSULTATION:  01/29/2005  DATE OF DISCHARGE:                                   CONSULTATION   REASON FOR CONSULTATION:  Thank you for asking [place] to see Mr. Ameir Faria for profound hypotension.   HISTORY OF PRESENT ILLNESS:  Mr. Lizak is an 77 year old gentleman with  known history of paroxysmal atrial fibrillation on amiodarone with history  of concomitant bradycardia who is status post pacemaker implantation with a  St. Jude's device.  He recently underwent blepharoplasty for which his  Coumadin was held.   The patient presented to Dr. Yevonne Pax office the other day, having  awakened in the middle of the night, gone to the bathroom to urinate, and  having urinated, tried to get back to bed.  He became profoundly lightheaded  on return to bed and sat in the chair.  He was unable to get from the chair  and finally fell to the floor and crawled back to bed.  By the time he got  to Dr. Yevonne Pax office, he remained quite hypotensive with blood pressure  in the 80's.  Was assumed to be in atrial fibrillation with a rapid  ventricular response and was referred for hospitalization.   He has recently been referred to Dr. Rickard Patience because of his  orthostatic hypotension, both for further management of this as well as for  _________ of the spectrum in which is autonomic insufficiencies occur.  Now  he does have autonomic failure.  She put him on Mestinon 15 mg b.i.d.  He  has not felt that this helped much, and maybe has hurt a little bit.  He has  problems with constipation.  I do not have any other details of his workup.   PAST MEDICAL HISTORY:  1.  Nonobstructive disease and catheterization in November 2003 with  EF 60-      70%.  2.  Review of systems otherwise is noncontributory.   MEDICATIONS:  1.  Primatene 10 t.i.d.  2.  Synthroid 200 mcg.  3.  Coumadin 7.5 mg.  4.  IV amiodarone for re-bolus.   ALLERGIES:  No known drug allergies.   PHYSICAL EXAMINATION:  VITAL SIGNS:  Blood pressure has been quite labile  with records from 81-124 and moderately symptomatic.  HEENT:  Normocephalic, atraumatic.  NECK:  Veins were flat.  Carotids were brisk and full bilaterally without  bruits.  BACK:  Without kyphoscoliosis.  LUNGS:  Clear.  HEART:  Sounds irregular, but somewhat distant.  ABDOMEN:  Soft with active bowel sounds.  EXTREMITIES:  Without significant edema.  NEUROLOGICAL:  Grossly normal.  SKIN:  Warm and dry.   STUDIES:  Electrocardiogram with ventricular pacing at rates of 80,  intermixed with sinus rhythm.  Interrogation of his pacemaker demonstrated  atrial undersensing which was reprogrammed.  I also noticed that his atrial  fibrillation episodes  constitute only a small portion of his total time  since June 9.  I do not have data prior to that, but the occurrences since  June 9 seem to have started on June 21, i.e., night of admission with  numerous episodes associated with atrial undersensing and resumption of  counting.   IMPRESSION:  1.  Orthostatic hypotension, question context of autonomic failure.  2.  Paroxysmal atrial fibrillation, coincidental with the aggravation of his      hypotension.  3.  Bradycardia, status post St. Jude pacemaker implantation with atrial      undersensing, now corrected.  4.  Amiodarone per #2 with no evidence to suggest contributing to #1.  5.  Nonobstructive coronary artery disease.  6.  Hypothyroidism, currently on therapy with an elevated TSH.   DISCUSSION:  Mr. Asebedo has symptomatic hypotension that is orthostatic,  at least in large part.  I do not know what Dr. Odessa Fleming evaluation has  demonstrated due to the spectrum of  autonomic failure, but with his  constipation and his progressive symptoms, I am concerned that this is a  manifestation of a more systemic disorder.   In any case, his major problem now is blood pressure support, and I think  increasing his Midodrine up towards 15 then 20 mg t.i.d. makes sense.  In  addition, we will plan to resume his Mestinon now not at 15 but at 30 mg  b.i.d.  I wonder whether it is also not time to consider not withstanding  the summer, waist high support stockings.   Furthermore, his atrial fibrillation may be aggravating his hypotension, and  while I understand the TEE demonstrates some left atrial smoke, I will defer  to Dr. Reyes Ivan.  If this is a contraindication to proceed with DC  cardioversion, I think that maintenance of sinus rhythm is going to augment  his ability to be upright.   RECOMMENDATIONS:  Based on the above therefore:  1.  Increase his Midodrine to 15 mg t.i.d.  2.  Will give Mestinon 30 mg b.i.d.  3.  Strongly consider cardioversion at this point, unless contraindicated      based on Dr. Silva Bandy findings.  4.  Consider waist high support stockings.   Thank you for this consultation.       SCK/MEDQ  D:  01/29/2005  T:  01/29/2005  Job:  [id number]   cc:   Rickard Patience, M.D.  Urology, [facility]

## 2010-12-25 NOTE — Discharge Summary (Signed)
NAME:  Marco Gregory, Marco Gregory NO.:  [id number]   MEDICAL RECORD NO.:  [id number]          PATIENT TYPE:  INP   LOCATION:  3706                         FACILITY:  [facility]   PHYSICIAN:  Elmore Guise., M.D.DATE OF BIRTH:  1921-07-30   DATE OF ADMISSION:  01/27/2005  DATE OF DISCHARGE:  01/29/2005                                 DISCHARGE SUMMARY   DISCHARGE DIAGNOSES:  1.  Atrial fibrillation.  2.  Sick sinus syndrome (status post pacemaker implant).  3.  Chronic Coumadin therapy.  4.  Hypothyroidism.  5.  Orthostatic hypotension and presyncope.   HOSPITAL COURSE:  The patient is a very pleasant 77 year old white male who  was admitted on January 27, 2005, with fatigue, recurrent atrial fibrillation  with rapid ventricular response, as well as presyncope. On admission, he was  reloaded with IV amiodarone with significant improvement in his atrial  fibrillation. He underwent TEE on January 28, 2005, which showed smoke in the  left atrial left atrial appendage and descending aorta. He also had moderate  aortic regurgitation. Because of his sluggish flow, DC cardioversion was not  performed and is recommended after 3 weeks of therapeutic anticoagulation.  Due to his longstanding and moderate atrial regurgitation, chances are lower  that he would stay in sinus rhythm. However, the patient reports he is  feeling back to normal. He is ready to go home. His presyncope has  resolved and energy level has returned back to normal. He was discharged on  January 29, 2005, to continue the following medications:  1.  Midodrine 10 mg t.i.d.  2.  Mestinon once daily.  3.  Coumadin as directed.  4.  Amiodarone 200 mg b.i.d.  5.  Synthroid 0.25 mg daily.   He was also advised to wear support stockings to help with his venous return  and preload. He will return to Dr. Ronny Flurry at [facility] in  1 week for office visit as well as EKG and PT/INR. He is to notify the  office with  any problems or concerns.       TWK/MEDQ  D:  01/30/2005  T:  01/31/2005  Job:  [id number]

## 2010-12-25 NOTE — Cardiovascular Report (Signed)
   NAME:  Marco Gregory, Marco Gregory NO.:  [id number]   MEDICAL RECORD NO.:  [id number]                   PATIENT TYPE:  INP   LOCATION:  4740                                 FACILITY:  [facility]   PHYSICIAN:  Colleen Can. Deborah Chalk, M.D.            DATE OF BIRTH:  02-12-1921   DATE OF PROCEDURE:  07/03/2002  DATE OF DISCHARGE:                              CARDIAC CATHETERIZATION   HISTORY:  The patient has a history of atrial fibrillation and associated  chest pain with episode paroxysmal atrial fibrillation.  He is referred for  catheterization prior to initiation of Coumadin anticoagulation and  permanent pacing.   PROCEDURES:  Left heart catheterization with selective coronary angiography,  left ventricular angiography, and Perclose.   TYPE AND SITE OF ENTRY:  Percutaneous right femoral artery with Perclose.   CATHETERS:  A 6 French 4 curved Judkins right and left coronary catheters, 6  French pigtail ventriculographic catheter.   CONTRAST MATERIAL:  Omnipaque.   MEDICATIONS GIVEN PRIOR TO THE PROCEDURE:  Valium 10 mg p.o.   MEDICATIONS GIVEN DURING THE PROCEDURE:  Versed 1 mg IV.   COMMENTS:  The patient tolerated the procedure well.   HEMODYNAMIC DATA:  The aortic pressure was 84/60,  LV was 84/7-10.  There  was no aortic valve gradient noted on pullback.   ANGIOGRAPHIC DATA:  1. Left main coronary artery has minimal irregularities distally.  2. Left anterior descending:  Left anterior descending has 20% narrowing     proximally.  There are scattered irregularities in the left anterior     descending but no significant focal disease.  3. Left circumflex:  The left circumflex has somewhat scattered diffuse     irregularities.  There is no significant stenotic lesions.  4. Right coronary artery:  The right coronary artery is a dominant vessel.     It has minor irregularities.  It is normal.   LEFT VENTRICULAR ANGIOGRAM:  Left ventricular angiogram was  performed in the  RAO position.  Overall cardiac size and silhouette are normal.  The global  ejection fraction is estimated to be 60-70%.  Regional wall motion is  normal.  There is no mitral regurgitation noted.   OVERALL IMPRESSION:  1. Normal left ventricular function.  2. Very mild trivial three-vessel coronary atherosclerosis.    DISCUSSION:  It is felt that any chest pain the patient has is related to  his arrhythmia and not related to obstructive coronary disease. Overall his  prognosis should be excellent.                                                     Colleen Can. Deborah Chalk, M.D.    SNT/MEDQ  D:  07/03/2002  T:  07/03/2002  Job:  [id number]

## 2010-12-25 NOTE — Procedures (Signed)
NAME:  Marco Gregory, Marco Gregory NO.:  [id number]   MEDICAL RECORD NO.:  [id number]          PATIENT TYPE:  OUT   LOCATION:  SLEEP CENTER                 [facility]ACILITY[facility]  [facility]   PHYSICIAN:  Clinton D. Maple Hudson, M.D. DATE OF BIRTH:  03/31/1921   DATE OF STUDY:                              NOCTURNAL POLYSOMNOGRAM   REFERRING PHYSICIAN:  Dr. Jetty Duhamel.   DATE OF STUDY:  September 07, 2005.   INDICATION FOR STUDY:  Hypersomnia with sleep apnea.   EPWORTH SLEEPINESS SCORE:  17/24.   BMI:  32.   WEIGHT:  250 pounds.   HOME MEDICATIONS:  Amiodarone, Synthroid, Coumadin, Zetia, Tylenol.   SLEEP ARCHITECTURE:  Short total sleep time 238 minutes with fragmented  sleep. Delayed sleep onset 11:08 p.m. Sleep efficiency 60%. Stage I was 15%,  stage II 84%, stages III and IV absent, REM 1% of total sleep time. Sleep  latency 29 minutes, awake after sleep onset 104 minutes, arousal index 58.  No bedtime medication reported.   RESPIRATORY DATA:  Apnea/hypopnea index (AHI, RDI) 74.9 obstructive events  per hour indicating severe obstructive sleep apnea/hypopnea syndrome. This  included 1 central apnea, 241 obstructive apneas and 55 hypopneas. Events  were not positional. REM AHI of 30 per hour. Because he could not sustain  sleep sufficiently to accumulate enough time for protocol, C-PAP split  protocol titration could not be utilized on the study night.   OXYGEN DATA:  Variable snoring loud at times with oxygen desaturation to a  nadir of 76%. Mean oxygen saturation through the study was 90% on room air.   CARDIAC DATA:  Pacemaker in place. Rhythm mostly sinus but with occasional  paced beats, rare PACs and PVCs.   MOVEMENT/PARASOMNIA:  A total of 57 limb jerks were reported of which 7 were  associated with arousal or awakening for periodic limb movement with arousal  index of 1.8 per hour which is probably insignificant.   IMPRESSION/RECOMMENDATIONS:  1.  Severe obstructive sleep  apnea/hypopnea syndrome, AHI 74.9 per hour with      nonpositional events, variable sometimes loud snoring and oxygen      desaturation to 76%.  2.  Sleep fragmentation prevented use of C-PAP titration by protocol.      Consider return for C-PAP titration or evaluate for alternative      therapies as appropriate. Suggest sleep medication on return.      Clinton D. Maple Hudson, M.D.  Diplomate, Biomedical engineer of Sleep Medicine  Electronically Signed     CDY/MEDQ  D:  09/12/2005 12:51:06  T:  09/13/2005 00:32:14  Job:  [id number]

## 2010-12-25 NOTE — Assessment & Plan Note (Signed)
[facility]                               PULMONARY OFFICE NOTE   JAIKOB, BORGWARDT                 MRN:          [id number]  DATE:06/28/2006                            DOB:          09-19-1920    PROBLEMS:  1. Atelectasis.  2. Mild asthmatic bronchitis.  3. Obstructive sleep apnea.  4. Myasthenia.  5. 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  6. Atrial fibrillation/pacemaker.  7. Orthostasis and dizziness.   HISTORY:  He continues on CPAP at 12 CWP for his sleep apnea, and this seems  stable.  He went to the [facility] for evaluation of dizziness, neck  and back pains, which have been chronic, and he said they did a lot of  tests.  He does not have a synthesis report yet. Exertional dyspnea is worse  on stairs and when he is in pain.  Sometimes phlegm seems to hang in his  throat, and he feels a congestion at that level, bringing up some white,  rather thick mucus on Mucinex.  This complaint was more noticeable during  the fall weather, and he just finished a Z-pack.  He has some codeine cough  syrup.  He denies any awareness of reflux or of lower respiratory rattle or  congestion.  A chest x-ray was done at the [facility], but he does not  know that result either.  We had done a chest CT in January, which had shown  compressive atelectasis and emphysema.   MEDICATIONS:  1. Zetia 10 mg.  2. Amiodarone 200 mg.  3. Coumadin.  4. Synthroid 175 mcg.  5. Glaucoma eye drops.  6. Tylenol.  7. CPAP is at 12.   ALLERGIES:  No medication allergy.   OBJECTIVE:  Weight 242 pounds, BP 84/60, pulse regular 70, room air  saturation 94% at rest.  Long palate, overweight.  The pharynx is not red.  He is alert, speech quality is normal.  Quiet, clear chest.  Heart sounds regular without murmur.   IMPRESSION:  1. Chronic bronchitis.  2. Chronic amiodarone.  3. Suggest need for occasional chest x-ray to watch for  developing      fibrosis.  4. Pulmonary status is stable at this time.   PLAN:  Return in 6 months, earlier p.r.n.  He declined flu vaccine.     Clinton D. Maple Hudson, MD, Tonny Bollman, FACP  Electronically Signed    CDY/MedQ  DD: 06/29/2006  DT: 06/30/2006  Job #: [id number]   cc:   Cassell Clement, M.D.

## 2010-12-25 NOTE — Discharge Summary (Signed)
NAME:  Marco Gregory, MALAND NO.:  [id number]   MEDICAL RECORD NO.:  [id number]                   PATIENT TYPE:  INP   LOCATION:  4740                                 FACILITY:  [facility]   PHYSICIAN:  Dian Queen, P.A. [facility]               DATE OF BIRTH:  03-26-1921   DATE OF ADMISSION:  07/02/2002  DATE OF DISCHARGE:  07/05/2002                           DISCHARGE SUMMARY - REFERRING   HISTORY:  The patient is a 78 year old white married male with paroxysmal  atrial fibrillation for a number of years who lately has been more and more  symptomatic with increasingly frequent episodes.  When he is in sinus, he is  bradycardic in the 40s and when he is in atrial fibrillation, he is  tachycardic pushing in the 140-150 range.  He has been dizzy with this and  also having some low output symptoms.  He is admitted for initiation of  pindolol therapy and further evaluation.   ACCESSORY CLINICAL FINDINGS:  The TSH was 7.998 while of 0.15 mg q.d.,  __________  renal functions normal.  Hemoglobin 15.7, hematocrit 46.3, and  white count 7100.  The LFTs are normal.  Chest x-ray reveals some stable  left lower scarring an cardiomegaly with no acute processes being noted.   COURSE IN THE HOSPITAL:  The patient was admitted.  On admission, we began  him on pindolol but ultimately, it was clear that this was not going to be  sufficient for his bradytachycardia, and it was felt that permanent  pacemaker implantation was indicated.  Prior to this, he had a cardiac  catheterization by Colleen Can. Deborah Chalk, M.D. which revealed 20% proximal LAD,  and some minor irregularities in the RCA and circumflex with normal LV  function and normal LVEDP.   On 07/04/02, he underwent implantation of a dual chamber pacemaker without  complication.  He was begun on Coumadin later that night, and thereafter was  ultimately started on Toprol-XL 50 mg q.d.  His rhythm looked fine at the  time of  discharge.   IMPRESSION:  1. Paroxysmal atrial fibrillation with bradycardia/tachycardia/status post     permanent pacemaker implantation this admission.  2. Minimal coronary artery disease with normal left ventricular function and     normal left ventricular end-diastolic pressure per angiography this     admission.  3. Controlled hypertension.  4. Treated hypothyroidism.   DISPOSITION:  The patient is discharged home on Synthroid 0.15 mg q.d.,  Toprol-XL 50 mg q.d., Coumadin 5 mg q.d., Cozaar 25 mg q.d., aspirin 81 mg  q.d.  He will have a pro time in Dr. Yevonne Pax office on Monday.  He will  see Dr. Graciela Husbands back in 2-1/2 to 3 months for pacemaker interrogation.  It is  likely that we will begin him on amiodarone as an outpatient and cardiovert  him once he has been therapeutically anticoagulated for at least three  weeks.  Dian Queen, P.A. [facility]    BY/MEDQ  D:  07/05/2002  T:  07/06/2002  Job:  [id number]   cc:   Cassell Clement, M.D.   Duke Salvia, M.D. [facility]

## 2010-12-25 NOTE — Assessment & Plan Note (Signed)
[facility]                               PULMONARY OFFICE NOTE   Marco Gregory, Marco Gregory                 MRN:          [id number]  DATE:02/21/2006                            DOB:          09-27-1920    PROBLEMS:  1.  Atelectasis.  2.  Mild asthmatic bronchitis.  3.  Obstructive sleep apnea.  4.  Myasthenia.  5.  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  6.  Atrial fibrillation/pacemaker.  7.  Orthostasis and dizziness.   HISTORY:  He says variable dizziness occasionally, but not always associated  with changing positions.  Also, at times he notices shortness of breath and  backache when he is trying to climb stairs.  He complains of mucus/wet  throat, but does not notice reflux or postnasal drainage, particularly.  CPAP at 12 CWP is used every night.  Oxygen saturation with CPAP was good  enough that he did not need supplemental oxygen.  He did not notice that  Klonopin helped his leg jerks, but says that seems to be little problem now,  possibly because he is sleeping more soundly with the CPAP.  He denies need  to try Requip or other medications for leg jerks.  His primary frustration  and complaint remains his sense of dizziness which is being worked on by  others.   MEDICATIONS:  1.  Zetia 10 mg.  2.  Amiodarone 200 mg.  3.  Coumadin.  4.  Synthroid 175 mcg.  5.  Mestinon (he denies ever being told that he had myasthenia).  6.  Midodrine.  7.  CPAP 12.   No medication allergy.   OBJECTIVE:  VITAL SIGNS:  Weight 244 pounds, blood pressure 110/80, pulse  rate was 69, room air saturation 94%.  GENERAL:  He seemed alert and in no distress.  There was no nystagmus, no  tremor.  He seemed able to stand and walk down the hall comfortably.  CHEST:  Quite and clear.  HEART:  Sounds were regular to my examination without murmur.  There were no  pressure marks on his face from the mask.   IMPRESSION:  1.  Stable obstructive  sleep apnea with sleep quality improved.  2.  Chronic bronchitis previously demonstrated with mild obstructive airways      disease and some response to bronchodilator, normal lung volumes, and      mildly reduced diffusion.  Recall that his six-minute walk test showed      oxygen saturation maintained 94-95% on room air as he covered 405 m.  He      did complain of pain across his back with his sustained walking and      recognizing that is not addressed by my findings now.   PLAN:  No change to offer.  Schedule return four months, earlier p.r.n.                                   Clinton D. Maple Hudson, MD, FCCP, FACP   CDY/MedQ  DD:  02/22/2006  DT:  02/23/2006  Job #:  [id number]   cc:   Cassell Clement, MD

## 2010-12-25 NOTE — Op Note (Signed)
NAME:  Marco Gregory, Marco Gregory NO.:  [id number]   MEDICAL RECORD NO.:  [id number]          PATIENT TYPE:  AMB   LOCATION:  DSC                          FACILITY:  [facility]   PHYSICIAN:  Robert A. Thurston Hole, M.D. DATE OF BIRTH:  01-13-1921   DATE OF PROCEDURE:  04/07/2006  DATE OF DISCHARGE:                                 OPERATIVE REPORT   PREOPERATIVE DIAGNOSIS:  Right elbow septic olecranon bursitis.   POSTOPERATIVE DIAGNOSIS:  Right elbow septic olecranon bursitis/Methicillin  resistant Staphylococcus aureus infection.   PROCEDURE:  Right elbow olecranon bursectomy.   SURGEON:  Elana Alm. Thurston Hole, M.D.   ASSISTANT:  Julien Girt, P.A.   ANESTHESIA:  General.   OPERATIVE TIME:  40 minutes.   COMPLICATIONS:  None.   INDICATIONS:  The patient is an 77 year old gentleman who has had a septic  MRSA olecranon bursal infection for the past month, treated on antibiotics,  but despite this, this has not resolved and he is now to undergo olecranon  bursectomy.   PROCEDURE IN DETAIL:  The patient was brought to the operating room on  04/07/2006, placed on the operating table in the supine position.  After an  adequate level of general anesthesia was obtained, his right arm was prepped  using sterile DuraPrep and draped using sterile technique.  He received  vancomycin 1 g IV preoperatively for prophylaxis.  The arm was exsanguinated  and a tourniquet elevated to 275 mm.  Initially through a 3 cm transverse  incision based over the olecranon bursa, initial exposure was made.  The  underlying subcutaneous tissues were incised along with skin incision.  There was a small amount of bursal serous fluid found, but mostly there was  a large amount of granulation infected tissue in the bursal sac.  This was  sent for aerobic and anaerobic cultures.   A subtotal bursectomy was carried out protecting the ulnar nerve and the  radial nerve.  The underlying triceps tendon and  fascia over the olecranon  was normal.  No spur noted.  After a subtotal bursectomy was carried out,  the wound was copiously irrigated.  The wound was then closed with  interrupted mattress nylon 2-0 sutures and injected with 0.25% Marcaine.  Sterile dressing and a long arm splint applied.  Tourniquet was released and  the patient was awakened and taken to the recovery room in stable condition.  Needle and sponge counts were correct x2 at the end of the case.   FOLLOWUP CARE:  The patient will be followed as an outpatient on doxycycline  and Vicodin and Rifampin.  He will be seen in my office in one week for  wound check and followup.      Robert A. Thurston Hole, M.D.  Electronically Signed     RAW/MEDQ  D:  04/07/2006  T:  04/07/2006  Job:  [id number]

## 2010-12-25 NOTE — H&P (Signed)
NAMESKYLAR, PRIEST NO.:  [id number]   MEDICAL RECORD NO.:  [id number]          PATIENT TYPE:  INP   LOCATION:  3706                         FACILITY:  [facility]   PHYSICIAN:  Cassell Clement, M.D. DATE OF BIRTH:  [birth year]   DATE OF ADMISSION:  01/27/2005  DATE OF DISCHARGE:                                HISTORY & PHYSICAL   CHIEF COMPLAINT:  Rapid heart rate and increased dizziness.   HISTORY:  This is an 77 year old married Caucasian gentleman who has had a  long history of worsening orthostatic hypotension manifested as a severe  orthostatic dizziness. He has had symptoms for approximately three years. He  has had an extensive workup at [facility] and has also been worked up at [facility] in [place] and most recently by Dr. Renie Ora in [facility] who did a tilt table test and has given him a trial of Mestinon. The  patient has a history of paroxysmal atrial fibrillation. His recent  electrocardiograms had shown sinus rhythm although he has been on Coumadin.  His past cardiac history dates back to episodes of dizziness leading to  implantation of a pacesetter dual-chamber pacemaker in 2003 which did not  appear to improve his orthostatic intolerance. The patient had a cardiac  catheterization in 2003 showing very mild three-vessel coronary artery  disease. He has had a normal Cardiolite stress test with ejection fractions  of 57%. He has had an echocardiogram November 20, 2001 showing left ventricular  hypertrophy with hyperdynamic left ventricular systolic function and an  ejection fraction of estimated 70-75% with a left atrial enlargement at that  time of 56 mm and mild to moderate aortic insufficiency. At that time, he  had trace tricuspid regurgitation and right ventricular systolic pressure  estimated was slightly elevated between 33 and 43. The patient as noted has  had a workup at [facility] and subsequently was seen by Dr. Alphonzo Dublin in [facility] and had a carotid ultrasound study done in 2004 which showed moderate  plaque at the left carotid bifurcation and internal carotid artery and mild  plaque formation in the right common carotid artery without significant  stenosis. He has also had transcranial Doppler ultrasound studies which were  technically limited and he has had a CT angiogram which was normal in 2004.  The patient has had a normal serum protein electrophoresis and urine protein  electrophoresis in January of 2005. He was seen for neurosurgical  consultation for chronic low back pain in November of 2005 by Dr. Shirlean Kelly who noted the lumbar spine CT showing advanced spondylosis as well  as moderate canal stenosis at L3-L4. The patient has had an ENT evaluation  for possible vertigo in February of 2006 with a normal EMG but has  documented sensory neuro hearing loss bilaterally. He is referred for  vestibular rehabilitation but attended only three visits with limited  benefit. The patient has had trials of Florinef which he did not tolerate.  He has been advised to wear support stockings which he has not done on a  consistent basis. He has had trials of midodrine 10  mg up to four times a  day. He has a past history of having had Grave's disease in 1967 and  received iodine ablation therapy with residual chronic hypothyroidism and is  on Synthroid. His pacemaker was placed in 2003 because of tachy-brady  syndrome with paroxysmal atrial fibrillation. He has a history of  obstructive sleep apnea and has been seen by Dr. Fannie Knee in the past but  does not use a C-PAP machine.   Past medical history also includes a history of glaucoma. As noted, he had  recent eye surgery June 9th at [facility] by Dr. Carilyn Goodpasture for ptosis of his right  eyelid.   PRESENT MEDICATIONS:  1.  The patient has been on Coumadin 5 mg, taking a full tablet Monday,      Wednesday and Friday, Saturday and Sunday and a half tablet other  days.  2.  The patient is also on Synthroid 0.2 mg daily.  3.  Amiodarone 100 mg daily.  4.  ProAmatine 10 mg three times a day.  5.  More recently was started on Mestinon 1/4 tablet three times a day and      he thinks that the dose is a 60 mg tablet.   REVIEW OF SYSTEMS:  The patient has been experiencing some recent  constipation and has not had a bowel movement in five days. He has had no  hematochezia or melena.  He has genitourinary symptoms of urinary frequency  and he has nocturia 3-4 times a night.   The patient does have a history of some shortness of breath. He has had a  history of morning stiffness consistent with arthritis.   SOCIAL HISTORY:  The social history reveals that he smoked cigarettes for 25  years, stopped in 1967. Patient has been married for 58 years. 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 He  drinks very little alcohol.   ALLERGIES:  He has no known drug allergies but in the past did not tolerate  FLORINEF.   FAMILY HISTORY:  He has a brother who had amyloidosis and another brother  had a brain tumor. His mother had uterine cancer.   HISTORY OF PRESENT ILLNESS:  The patient was in his usual state of health  last evening and got up as is his custom to go the bathroom at about 4 a.m.  He felt fine when walking to the toilet but on the way back to bed he  suddenly felt extremely weak and had to sit down abruptly in a chair and  stayed there for three or four minutes and was so dizzy that he had to  actually physically crawl back on his hands and knees to the bedroom to  climb back in bed. He has not able to stand unassisted since then. At the  time that this happened, he was also aware that his heart rate was  irregular. He denies chest pain and he did not have any nausea, vomiting or  diaphoresis.   PHYSICAL EXAMINATION:  VITALS:  His weight today is 244 pounds, down 11 pounds since last seen.  His blood  pressure is 80/60 (supine), pulse was 115  and atrial fibrillation (supine).  SKIN:  Warm and dry.  HEENT:  Pupils equal and reactive. Conjunctiva normal. The carotids reveal  no bruits.  CHEST:  Clear.  HEART:  Reveals no murmur, gallop, rub or click. Rhythm irregular.  ABDOMEN:  Soft and nontender. There is no  palpable mass or organomegaly.  RECTAL:  Not done.  EXTREMITIES:  No phlebitis or edema. He has excellent pedal pulses.  NEUROLOGIC:  He has good muscle strength bilaterally.   IMPRESSION:  1.  Recurrent paroxysmal atrial fibrillation.  2.  Dual-chamber pacemaker with apparent sensing problems and inappropriate      pacer spikes seen on rhythm strip, falling after the QRS.  3.  Autonomic neuropathy of undetermined cause with severe orthostatic      hypotension exacerbated by loss of atrial kick and atrial fib with rapid      ventricular response.  4.  Chronic Coumadin anticoagulation.   DISPOSITION:  The patient is being admitted. We will give him IV amiodarone  reloading. We will consult Dr. Berton Mount, his EP physician. We will plan  for a transesophageal echocardiogram tomorrow by Dr. Lady Deutscher to look  for any left atrial clots since he did have a five-day interruption of his  Coumadin from June 4th through June 9th leading up to his eye surgery at  [facility].       TB/MEDQ  D:  01/27/2005  T:  01/27/2005  Job:  [id number]   cc:   Duke Salvia, M.D.   Rickard Patience  [facility]

## 2010-12-31 ENCOUNTER — Encounter: Payer: Self-pay | Admitting: *Deleted

## 2010-12-31 ENCOUNTER — Other Ambulatory Visit: Payer: Self-pay | Admitting: *Deleted

## 2010-12-31 DIAGNOSIS — I4891 Unspecified atrial fibrillation: Secondary | ICD-10-CM

## 2011-01-01 ENCOUNTER — Ambulatory Visit (INDEPENDENT_AMBULATORY_CARE_PROVIDER_SITE_OTHER): Payer: Medicare Other | Admitting: Cardiology

## 2011-01-01 ENCOUNTER — Encounter: Payer: Self-pay | Admitting: Cardiology

## 2011-01-01 ENCOUNTER — Ambulatory Visit (INDEPENDENT_AMBULATORY_CARE_PROVIDER_SITE_OTHER): Payer: Medicare Other | Admitting: *Deleted

## 2011-01-01 ENCOUNTER — Encounter: Payer: Medicare Other | Admitting: *Deleted

## 2011-01-01 VITALS — BP 100/60 | HR 70 | Wt 213.0 lb

## 2011-01-01 DIAGNOSIS — Z79899 Other long term (current) drug therapy: Secondary | ICD-10-CM

## 2011-01-01 DIAGNOSIS — I4891 Unspecified atrial fibrillation: Secondary | ICD-10-CM

## 2011-01-01 DIAGNOSIS — R42 Dizziness and giddiness: Secondary | ICD-10-CM

## 2011-01-01 LAB — CBC WITH DIFFERENTIAL/PLATELET
Basophils Relative: 0.4 % (ref 0.0–3.0)
Eosinophils Absolute: 0.1 10*3/uL (ref 0.0–0.7)
HCT: 39.9 % (ref 39.0–52.0)
Hemoglobin: 13.4 g/dL (ref 13.0–17.0)
Lymphs Abs: 1.9 10*3/uL (ref 0.7–4.0)
MCHC: 33.6 g/dL (ref 30.0–36.0)
MCV: 92.9 fl (ref 78.0–100.0)
Monocytes Absolute: 0.7 10*3/uL (ref 0.1–1.0)
Neutro Abs: 3.8 10*3/uL (ref 1.4–7.7)
Neutrophils Relative %: 58.2 % (ref 43.0–77.0)
RBC: 4.3 Mil/uL (ref 4.22–5.81)

## 2011-01-01 LAB — COMPREHENSIVE METABOLIC PANEL
AST: 20 U/L (ref 0–37)
Alkaline Phosphatase: 59 U/L (ref 39–117)
BUN: 26 mg/dL — ABNORMAL HIGH (ref 6–23)
Creatinine, Ser: 1.4 mg/dL (ref 0.4–1.5)
Total Bilirubin: 0.6 mg/dL (ref 0.3–1.2)

## 2011-01-01 LAB — T4, FREE: Free T4: 1.23 ng/dL (ref 0.60–1.60)

## 2011-01-01 NOTE — Assessment & Plan Note (Signed)
The patient has a past history of severe orthostatic hypotension secondary to autonomic neuropathy.  He has improved on high dose midodrine and Florinef.  His symptoms have been less severe recently.  He has not had any syncope.  He has been checking his blood pressure at home and has been getting high readings in the range of 160/90.

## 2011-01-01 NOTE — Assessment & Plan Note (Signed)
The patient has not been expressing a recent recurrence of palpitations or atrial fibrillation.  He remains on warfarin.  His INR today is therapeutic at 2.2 and he'll continue same Coumadin.  He has not been having any side effects from the Coumadin such as gastrointestinal bleeding or excessive bruising.

## 2011-01-01 NOTE — Progress Notes (Signed)
Inez Pilgrim Date of Birth:  1921/06/27 [facility] [address]   [address] [place], [place]  [address] [phone]           Fax   [phone]  History of Present Illness: This pleasant 77 year old gentleman is seen for a three-month followup office visit.  He has a complex past medical history.  He has a history of severe orthostatic hypotension felt to be secondary to autonomic neuropathy.  He has had good clinical improvement on high-dose midodrine and Florinef.  Patient has a lot of back pain and has spinal stenosis.  He has had trials of epidural steroids which have not helped much.  The patient also has a history of urinary frequency and has seen his urologist Dr. Aldean Ast who presently has him on Rapaflow with some improvement.The patient does not have any history of ischemic heart disease.  He had a normal adenosine Cardiolite stress test 07/07/06 he had an echocardiogram on 03/09/9 which showed normal left ventricular systolic function and impaired relaxation and mild aortic sclerosis and mild pulmonary hypertension.  Current Outpatient Prescriptions  Medication Sig Dispense Refill  . Acetaminophen (TYLENOL PO) Take 1-2 tablets by mouth as needed.        . digoxin (LANOXIN) 0.125 MG tablet Take 125 mcg by mouth daily.        . dorzolamide-timolol (COSOPT) 22.3-6.8 MG/ML ophthalmic solution Place 1 drop into both eyes daily.        . fludrocortisone (FLORINEF) 0.1mg /mL SUSP Take by mouth daily.        [name] HYDROcodone-acetaminophen (VICODIN) 5-500 MG per tablet Take 1 tablet by mouth as needed.        [name] levothyroxine (SYNTHROID, LEVOTHROID) 175 MCG tablet Take 175 mcg by mouth daily.        . midodrine (PROAMATINE) 10 MG tablet Take 1 tablet (10 mg total) by mouth 4 (four) times daily.  120 tablet  0  . PYRIDOSTIGMINE BROMIDE PO Take by mouth. Take as directed by Dr. Sandria Manly       . Solifenacin Succinate (VESICARE PO) Take 1 tablet by mouth daily.         [name] warfarin (COUMADIN) 2.5 MG tablet Take 2.5 mg by mouth. As directed by the [facility]         No Known Allergies  Patient Active Problem List  Diagnoses  . OBSTRUCTIVE SLEEP APNEA  . HYPERTENSION, UNSPECIFIED  . Atrial fibrillation  . SICK SINUS/ TACHY-BRADY SYNDROME  . ASTHMATIC BRONCHITIS, ACUTE  . ATELECTASIS  . DYSAUTONOMIA  . ORTHOSTATIC DIZZINESS  . PACEMAKER, PERMANENT    History  Smoking status  . Former Smoker  . Quit date: 08/09/1965  Smokeless tobacco  . Not on file    History  Alcohol Use No    No family history on file.  Review of Systems: Constitutional: no fever chills diaphoresis or fatigue or change in weight.  Head and neck: no hearing loss, no epistaxis, no photophobia or visual disturbance. Respiratory: No cough, shortness of breath or wheezing. Cardiovascular: No chest pain peripheral edema, palpitations. Gastrointestinal: No abdominal distention, no abdominal pain, no change in bowel habits hematochezia or melena. Genitourinary: No dysuria, , no urgency, no nocturia.Positive for urinary frequency Musculoskeletal:No arthralgias, no back pain, no gait disturbance or myalgias. Neurological: No dizziness, no headaches, no numbness, no seizures, no syncope, no weakness, no tremors. Hematologic: No lymphadenopathy, no easy bruising. Psychiatric: No confusion, no hallucinations, no sleep disturbance.  Physical Exam: Filed Vitals:   01/01/11 1511  BP: 100/60  Pulse: 70  The general appearance reveals an elderly gentleman who walks with a walker.Pupils equal and reactive.   Extraocular Movements are full.  There is no scleral icterus.  The mouth and pharynx are normal.  The neck is supple.  The carotids reveal no bruits.  The jugular venous pressure is normal.  The thyroid is not enlarged.  There is no lymphadenopathy.The chest is clear to percussion and auscultation. There are no rales or rhonchi. Expansion of the chest is symmetrical.The  precordium is quiet.  The first heart sound is normal.  The second heart sound is physiologically split.  There is no murmur gallop rub or click.  There is no abnormal lift or heave.The abdomen is soft and nontender. Bowel sounds are normal. The liver and spleen are not enlarged. There Are no abdominal masses. There are no bruits. Normal extremity without phlebitis or edema.  Pedal pulses are present.The skin is warm and dry.  There is no rash.Strength is normal and symmetrical in all extremities.  There is no lateralizing weakness.  There are no sensory deficits.   Assessment / Plan:  Continues present medication.  INR is 2.2.  Recheck in 3 months for followup office visit and protime continue to get monthly protimes

## 2011-01-05 ENCOUNTER — Telehealth: Payer: Self-pay | Admitting: Cardiology

## 2011-01-05 NOTE — Telephone Encounter (Signed)
Labs reported. No questions.

## 2011-01-05 NOTE — Telephone Encounter (Signed)
Message copied by Karle Plumber on Tue Jan 05, 2011 10:26 AM ------      Message from: Cassell Clement      Created: Sat Jan 02, 2011  3:42 PM       plese report.  Thyroid level is normal.  CBC normal.  Kidneys drier. Increase water intake. LFTs nl

## 2011-02-01 ENCOUNTER — Ambulatory Visit (INDEPENDENT_AMBULATORY_CARE_PROVIDER_SITE_OTHER): Payer: Medicare Other | Admitting: *Deleted

## 2011-02-01 DIAGNOSIS — I4891 Unspecified atrial fibrillation: Secondary | ICD-10-CM

## 2011-02-01 LAB — POCT INR: INR: 1.7

## 2011-02-15 ENCOUNTER — Ambulatory Visit (INDEPENDENT_AMBULATORY_CARE_PROVIDER_SITE_OTHER): Payer: Medicare Other | Admitting: *Deleted

## 2011-02-15 DIAGNOSIS — I4891 Unspecified atrial fibrillation: Secondary | ICD-10-CM

## 2011-02-15 LAB — POCT INR: INR: 2.6

## 2011-02-16 ENCOUNTER — Encounter: Payer: Self-pay | Admitting: Internal Medicine

## 2011-03-03 ENCOUNTER — Other Ambulatory Visit: Payer: Self-pay | Admitting: Cardiology

## 2011-03-03 MED ORDER — MIDODRINE HCL 10 MG PO TABS: 10.0000 mg | ORAL_TABLET | Freq: Four times a day (QID) | ORAL | Status: DC

## 2011-03-03 NOTE — Telephone Encounter (Signed)
Went to refill and midodrine marked out of med list.  Spoke with patient and he has been taking this all along and has not seen anyone since seeing  Dr. Patty Sermons.  Discussed with Lawson Fiscal and called in medication as requested

## 2011-03-03 NOTE — Telephone Encounter (Signed)
Called to get a refill of Midodrine 10mg  Filled at [facility] on [address] [phone]. Please call back. I have pulled the chart.

## 2011-03-15 ENCOUNTER — Encounter: Payer: Medicare Other | Admitting: *Deleted

## 2011-03-15 ENCOUNTER — Ambulatory Visit (INDEPENDENT_AMBULATORY_CARE_PROVIDER_SITE_OTHER): Payer: Medicare Other | Admitting: *Deleted

## 2011-03-15 DIAGNOSIS — I4891 Unspecified atrial fibrillation: Secondary | ICD-10-CM

## 2011-03-25 ENCOUNTER — Encounter: Payer: Medicare Other | Admitting: Internal Medicine

## 2011-03-25 ENCOUNTER — Encounter: Payer: Medicare Other | Admitting: *Deleted

## 2011-04-05 ENCOUNTER — Ambulatory Visit (INDEPENDENT_AMBULATORY_CARE_PROVIDER_SITE_OTHER): Payer: Medicare Other | Admitting: *Deleted

## 2011-04-05 ENCOUNTER — Ambulatory Visit (INDEPENDENT_AMBULATORY_CARE_PROVIDER_SITE_OTHER): Payer: Medicare Other | Admitting: Cardiology

## 2011-04-05 ENCOUNTER — Encounter: Payer: Self-pay | Admitting: Cardiology

## 2011-04-05 VITALS — BP 120/60 | HR 64 | Wt 210.0 lb

## 2011-04-05 DIAGNOSIS — R490 Dysphonia: Secondary | ICD-10-CM | POA: Insufficient documentation

## 2011-04-05 DIAGNOSIS — Q078 Other specified congenital malformations of nervous system: Secondary | ICD-10-CM

## 2011-04-05 DIAGNOSIS — G903 Multi-system degeneration of the autonomic nervous system: Secondary | ICD-10-CM

## 2011-04-05 DIAGNOSIS — N318 Other neuromuscular dysfunction of bladder: Secondary | ICD-10-CM

## 2011-04-05 DIAGNOSIS — I4891 Unspecified atrial fibrillation: Secondary | ICD-10-CM

## 2011-04-05 DIAGNOSIS — G238 Other specified degenerative diseases of basal ganglia: Secondary | ICD-10-CM

## 2011-04-05 DIAGNOSIS — M4800 Spinal stenosis, site unspecified: Secondary | ICD-10-CM

## 2011-04-05 DIAGNOSIS — N3281 Overactive bladder: Secondary | ICD-10-CM | POA: Insufficient documentation

## 2011-04-05 NOTE — Assessment & Plan Note (Signed)
The patient remains on Coumadin.  His INR today is therapeutic at 2.6.  He has not been aware of any recent palpitations or atrial fibrillation.

## 2011-04-05 NOTE — Assessment & Plan Note (Signed)
The patient has a history of severe orthostatic hypotension which has responded to high-dose midodrine and Florinef

## 2011-04-05 NOTE — Progress Notes (Signed)
Marco Gregory Date of Birth:  05/26/1921 [facility] [address]   [address] [place], [place]  [address] [phone]           Fax   [phone]  History of Present Illness: This pleasant 77 year old gentleman is seen for a scheduled followup office visit.  He has a complex past medical history.  He has a history of severe orthostatic hypotension he finally has it under control with the addition of high-dose Midrin and Florinef as directed by his neurologist Dr. Sandria Manly.The patient has a past history of paroxysmal atrial fibrillation.  He is on long-term Coumadin.  He's had a history of spinal stenosis and has seen Dr. Danielle Dess who has given him epidural injections without significant improvement.  The patient also sees a chiropractor Dr. Genene Churn.Patient has a history of hypothyroidism.  He has a functioning ventricular pacemaker for tachybradycardia syndrome.  He's had BPH with urinary frequency.  Recently he's had hoarseness of his voice.  He has a history of hypothyroidism And is on Synthroid. Thyroid functions were checked in May 2012 were normal on Synthroid  Current Outpatient Prescriptions  Medication Sig Dispense Refill  . Acetaminophen (TYLENOL PO) Take 1-2 tablets by mouth as needed.        . digoxin (LANOXIN) 0.125 MG tablet Take 125 mcg by mouth daily.        . dorzolamide-timolol (COSOPT) 22.3-6.8 MG/ML ophthalmic solution Place 1 drop into both eyes daily.        . fludrocortisone (FLORINEF) 0.1mg /mL SUSP Take by mouth daily.        [name] HYDROcodone-acetaminophen (VICODIN) 5-500 MG per tablet Take 1 tablet by mouth as needed.        [name] levothyroxine (SYNTHROID, LEVOTHROID) 175 MCG tablet Take 175 mcg by mouth daily.        . midodrine (PROAMATINE) 10 MG tablet Take 10 mg by mouth 4 (four) times daily.        [name] warfarin (COUMADIN) 2.5 MG tablet Take 2.5 mg by mouth. As directed by the [facility]       . DISCONTD: midodrine (PROAMATINE) 10 MG  tablet Take 1 tablet (10 mg total) by mouth 4 (four) times daily.  360 tablet  3    No Known Allergies  Patient Active Problem List  Diagnoses  . OBSTRUCTIVE SLEEP APNEA  . HYPERTENSION, UNSPECIFIED  . Atrial fibrillation  . SICK SINUS/ TACHY-BRADY SYNDROME  . ASTHMATIC BRONCHITIS, ACUTE  . ATELECTASIS  . DYSAUTONOMIA  . ORTHOSTATIC DIZZINESS  . PACEMAKER, PERMANENT    History  Smoking status  . Former Smoker  . Quit date: 08/09/1965  Smokeless tobacco  . Not on file    History  Alcohol Use No    Family History  Problem Relation Age of Onset  . Cancer      Review of Systems: Constitutional: no fever chills diaphoresis or fatigue or change in weight.  Head and neck: no hearing loss, no epistaxis, no photophobia or visual disturbance. Respiratory: No cough, shortness of breath or wheezing. Cardiovascular: No chest pain peripheral edema, palpitations. Gastrointestinal: No abdominal distention, no abdominal pain, no change in bowel habits hematochezia or melena. Genitourinary: No dysuria, Musculoskeletal:No arthralgias, no back pain, no gait disturbance or myalgias. Neurological: No dizziness, no headaches, no numbness, no seizures, no syncope, no weakness, no tremors. Hematologic: No lymphadenopathy, no easy bruising. Psychiatric: No confusion, no hallucinations, no sleep disturbance.    Physical Exam: Filed Vitals:  04/05/11 1440  BP: 120/60  Pulse: 64  The general appearance reveals a well-developed well-nourished gentleman in no distress.  Head and neck reveals that the voice is hoarse.  The pharynx is benign.  The carotids are normal.  Jugular venous pressure normal.  No lymphadenopathy. The chest is clear to percussion and auscultation. There are no rales or rhonchi. Expansion of the chest is symmetrical.  The precordium is quiet.  The first heart sound is normal.  The second heart sound is physiologically split.  There is no murmur gallop rub or click.   There is no abnormal lift or heave. The pacemaker pocket is well healed. The abdomen is soft and nontender. Bowel sounds are normal. The liver and spleen are not enlarged. There Are no abdominal masses. There are no bruits.  The pedal pulses are good.  There is no phlebitis or edema.  There is no cyanosis or clubbing.  Orthostatic blood pressure readings today showed that his systolic drops from 120 sitting to 112 standing    Assessment / Plan:  Continue present medication.  We will refer her to Dr. Haroldine Laws for ENT evaluation of his hoarseness.  He believes that he has seen Dr. Haroldine Laws several years ago for some other complaint.

## 2011-04-05 NOTE — Assessment & Plan Note (Signed)
The patient has had chronic hoarseness.  He is concerned as to what might be causing this.  His thyroid function is normal.  He does have a lot of phlegm which collects in his throat.  He previously has seen Dr. Fannie Knee for pulmonary issues.  He would like to get another opinion and would like to see a ear nose and throat specialist and we will refer him.

## 2011-04-06 ENCOUNTER — Ambulatory Visit (INDEPENDENT_AMBULATORY_CARE_PROVIDER_SITE_OTHER): Payer: Medicare Other | Admitting: Internal Medicine

## 2011-04-06 ENCOUNTER — Encounter: Payer: Self-pay | Admitting: Internal Medicine

## 2011-04-06 ENCOUNTER — Encounter: Payer: Self-pay | Admitting: *Deleted

## 2011-04-06 DIAGNOSIS — I959 Hypotension, unspecified: Secondary | ICD-10-CM

## 2011-04-06 DIAGNOSIS — I495 Sick sinus syndrome: Secondary | ICD-10-CM

## 2011-04-06 DIAGNOSIS — I4891 Unspecified atrial fibrillation: Secondary | ICD-10-CM

## 2011-04-06 DIAGNOSIS — Z950 Presence of cardiac pacemaker: Secondary | ICD-10-CM

## 2011-04-06 DIAGNOSIS — Q078 Other specified congenital malformations of nervous system: Secondary | ICD-10-CM

## 2011-04-06 LAB — BASIC METABOLIC PANEL
BUN: 27 mg/dL — ABNORMAL HIGH (ref 6–23)
CO2: 28 mEq/L (ref 19–32)
Chloride: 102 mEq/L (ref 96–112)
GFR: 53.49 mL/min — ABNORMAL LOW (ref >60.00)
Glucose, Bld: 103 mg/dL — ABNORMAL HIGH (ref 70–99)
Potassium: 4.7 mEq/L (ref 3.5–5.1)
Sodium: 138 mEq/L (ref 135–145)

## 2011-04-06 LAB — CBC WITH DIFFERENTIAL/PLATELET
Basophils Absolute: 0 10*3/uL (ref 0.0–0.1)
Basophils Relative: 0.4 % (ref 0.0–3.0)
HCT: 40.6 % (ref 39.0–52.0)
Hemoglobin: 13.5 g/dL (ref 13.0–17.0)
Lymphocytes Relative: 32.8 % (ref 12.0–46.0)
Lymphs Abs: 1.8 10*3/uL (ref 0.7–4.0)
Monocytes Relative: 13.4 % — ABNORMAL HIGH (ref 3.0–12.0)
Neutro Abs: 2.9 10*3/uL (ref 1.4–7.7)
RBC: 4.31 Mil/uL (ref 4.22–5.81)
RDW: 14.2 % (ref 11.5–14.6)

## 2011-04-06 NOTE — Assessment & Plan Note (Signed)
As above.

## 2011-04-06 NOTE — Progress Notes (Signed)
HPI  Marco Gregory is a 77 y.o. 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 in the interim he is also seeing Dr. love who is further up titrated his ProAmatine and Florinef with significant overall improvement We reviewed how he was taking his medications, however, his ProAmatine is being taken twice a day and 12 hour intervals as his control of his Mestinon. He continues to take Florinef.  Past Medical History  Diagnosis Date  . Sleep apnea   . Atrial fibrillation   . Long-term (current) use of anticoagulants   . Glaucoma   . Gout   . Hyperthyroidism   . Orthostatic hypotension   . Redundant prepuce and phimosis   . Retention of urine, unspecified   . Acute cystitis   . Urgency of urination   . Hypertonicity of bladder   . Bradycardia      Bradycardia status post pacemaker implantation  . HTN (hypertension)   . Atelectasis   . Asthmatic bronchitis   . Dysautonomia     Past Surgical History  Procedure Date  . Appendectomy   . Hernia repair   . Tonsillectomy   . Insert / replace / remove pacemaker 07/04/2002    dual chamber pacemaker - St. Jude model 1688 TC 58 cm active fixation lead was placed into the right ventricle and the St Jude model 1688 TC 52 cm active fixation pacing lead was placed in the right atrium.  The ventricular lead serial number was [id number] and the atrial lead serial number was [id number]  Of note, the pacemaker generator was an identity [id number] serial number [id number]   . Cardiac catheterization 07/03/2002    EF 60-70% /  Normal left ventricular function. / Very mild trivial three-vessel coronary atherosclerosis. / There  is no mitral regurgitation noted  . Cardioversion 12/19/2003    cardioversion and pacemaker interrogation  . Elbow bursa surgery 04/07/2006    Right elbow septic olecranon bursitis  . Toe amputation     Osteomyelitis, right foot second toe /  Right foot second ray amputation  . Eye surgery 01/2005  . Pacemaker insertion 07/04/2002    Current Outpatient Prescriptions  Medication Sig Dispense Refill  . Acetaminophen (TYLENOL PO) Take 1-2 tablets by mouth as needed.        . digoxin (LANOXIN) 0.125 MG tablet Take 125 mcg by mouth daily.        . dorzolamide-timolol (COSOPT) 22.3-6.8 MG/ML ophthalmic solution Place 1 drop into both eyes daily.        . fludrocortisone (FLORINEF) 0.1mg /mL SUSP Take by mouth daily.        [name] HYDROcodone-acetaminophen (VICODIN) 5-500 MG per tablet Take 1 tablet by mouth 2 (two) times daily.       [name] levothyroxine (SYNTHROID, LEVOTHROID) 175 MCG tablet Take 175 mcg by mouth daily.        . midodrine (PROAMATINE) 10 MG tablet Take 10 mg by mouth 4 (four) times daily.        [name] pyridostigmine (MESTINON) 60 MG tablet Take 60 mg by mouth daily.        [name] warfarin (COUMADIN) 2.5 MG tablet Take 2.5 mg  by mouth. As directed by the [facility]       . DISCONTD: midodrine (PROAMATINE) 10 MG tablet Take 1 tablet (10 mg total) by mouth 4 (four) times daily.  360 tablet  3    No Known Allergies  Review of Systems negative except from HPI and PMH  Physical Exam Well developed and well nourished in no acute distress HENT normal E scleral and icterus clear Neck Supple JVP flat; carotids brisk and full Clear to ausculation Regular rate and rhythm, no murmurs gallops or rub Soft with active bowel sounds No clubbing cyanosis and edema Alert and oriented, grossly normal motor and sensory function Skin Warm and Dry   Assessment and  Plan

## 2011-04-06 NOTE — Assessment & Plan Note (Signed)
Pacemaker magnet rate is 88 i.e. He has reached EOL. We have discussed risks benefits and alternatives to device generator replacement. He is agreeable to proceeding

## 2011-04-06 NOTE — Patient Instructions (Signed)
Your physician has recommended that you have a pacemaker generator change. Please see the instruction sheet given to you today for more information.  Your physician has recommended you make the following change in your medication:  1) Take midodrine (proamitine) 10mg  at 8am, 12pm, & 4pm. 2) Take mestinon 60mg  one tablet every morning and one tablet at lunch.

## 2011-04-06 NOTE — Assessment & Plan Note (Signed)
We have reviewed drug regimes for his Mestinonand  ProAmatine. Hopefully this will help his symptoms. He also has problems with urinary obstruction and nocturia. The event that he is put on flow enhancing drugs, I have told him that his orthostasis might worsen. It may be that the correct use of the former  medications will make the flow medications tolerable.

## 2011-04-06 NOTE — Assessment & Plan Note (Signed)
Persistent at this time

## 2011-04-09 ENCOUNTER — Telehealth: Payer: Self-pay | Admitting: Internal Medicine

## 2011-04-09 NOTE — Telephone Encounter (Signed)
I spoke with the patient. Questions answered. I made him aware I would be in touch with him later today about holding coumadin based on his INR of 2.9 pre-procedurely.

## 2011-04-09 NOTE — Telephone Encounter (Signed)
Has question regarding pace maker changed.

## 2011-04-09 NOTE — Telephone Encounter (Signed)
Per Dr. Graciela Husbands- orders to have the patient hold coumadin Sunday, Monday, and Tuesday in preparation for Generator change on 04/14/11. I have left a message on the patient's identified answering machine with his permission of Dr. Odessa Fleming recommendations.

## 2011-04-13 ENCOUNTER — Encounter: Payer: Medicare Other | Admitting: *Deleted

## 2011-04-13 ENCOUNTER — Telehealth: Payer: Self-pay | Admitting: Internal Medicine

## 2011-04-13 NOTE — Telephone Encounter (Signed)
I spoke with the patient's wife and informed her that the patient needed to be there at 11:30am tomorrow. She states they just received a call from the hospital asking him to be there at 10:30am. I have instructed her to follow the hospitals recommendations.

## 2011-04-13 NOTE — Telephone Encounter (Signed)
Pt calling re procedure for tommorow

## 2011-04-13 NOTE — Telephone Encounter (Signed)
Pt wife calling wanting to know what time pt needs to be at hospital tomorrow for procedure to change battery in pt device.   Pt said according to the letter pt received from [facility], pt needs to be there at 11:30am.   Pt wife remembers Herbert Seta telling pt procedure was to be a 9:30am   Please return call to pt wife to clarify

## 2011-04-14 ENCOUNTER — Ambulatory Visit (HOSPITAL_COMMUNITY): Payer: Medicare Other

## 2011-04-14 ENCOUNTER — Ambulatory Visit (HOSPITAL_COMMUNITY)
Admission: RE | Admit: 2011-04-14 | Discharge: 2011-04-14 | Disposition: A | Discharge status: Home / Self Care | Payer: Medicare Other | Source: Ambulatory Visit | Attending: Internal Medicine | Admitting: Internal Medicine

## 2011-04-14 DIAGNOSIS — I498 Other specified cardiac arrhythmias: Secondary | ICD-10-CM

## 2011-04-14 DIAGNOSIS — I495 Sick sinus syndrome: Secondary | ICD-10-CM | POA: Insufficient documentation

## 2011-04-14 DIAGNOSIS — I4891 Unspecified atrial fibrillation: Secondary | ICD-10-CM | POA: Insufficient documentation

## 2011-04-14 DIAGNOSIS — Z45018 Encounter for adjustment and management of other part of cardiac pacemaker: Secondary | ICD-10-CM | POA: Insufficient documentation

## 2011-04-14 LAB — PROTIME-INR: INR: 1.5 — ABNORMAL HIGH (ref 0.00–1.49)

## 2011-04-22 ENCOUNTER — Ambulatory Visit (INDEPENDENT_AMBULATORY_CARE_PROVIDER_SITE_OTHER): Payer: Medicare Other | Admitting: *Deleted

## 2011-04-22 ENCOUNTER — Encounter: Payer: Self-pay | Admitting: Internal Medicine

## 2011-04-22 DIAGNOSIS — I4891 Unspecified atrial fibrillation: Secondary | ICD-10-CM

## 2011-04-22 DIAGNOSIS — Z950 Presence of cardiac pacemaker: Secondary | ICD-10-CM

## 2011-04-22 LAB — PACEMAKER DEVICE OBSERVATION
ATRIAL PACING PM: 1
BATTERY VOLTAGE: 3.023 V
BRDY-0002RV: 60 {beats}/min
DEVICE MODEL PM: 7268137
RV LEAD IMPEDENCE PM: 587.5 Ohm
VENTRICULAR PACING PM: 71

## 2011-04-22 NOTE — Progress Notes (Signed)
Pacer check in clinic  

## 2011-05-03 NOTE — Op Note (Signed)
  NAME:  LUM, STILLINGER NO.:  [id number]  MEDICAL RECORD NO.:  [id number]  LOCATION:  [facility]                         FACILITY:  [facility]  PHYSICIAN:  Duke Salvia, MD, FACCDATE OF BIRTH:  04/17/1921  DATE OF PROCEDURE:  04/14/2011 DATE OF DISCHARGE:  04/14/2011                              OPERATIVE REPORT   PREOPERATIVE DIAGNOSIS:  Atrial fibrillation with a slow ventricular response, previously implanted pacemaker, now at end of life.  POSTOPERATIVE DIAGNOSIS:  Atrial fibrillation with a slow ventricular response, previously implanted pacemaker, now at end of life.  PROCEDURE:  Dual-chamber pacemaker implantation.  Following the obtained informed consent, the patient was brought to electrophysiology laboratory and placed on the fluoroscopic table in supine position.  There had been unipolar reversion of his pacemaker pulse generator.  Lidocaine was infiltrated along the line of previous incision, it was carried down to layer of the device pocket using sharp dissection, electrocautery was not used because of the concern about the underlying rhythm and similarly the pacemaker.  The pacemaker pocket was opened.  The device was explanted.  The patient lost unipolar capture and there was underlying intrinsic atrial fibrillation at about 45 beats per minute.  Evaluation of the previously implanted atrial lead demonstrated 1688 TC 52 cm length lead serial number [id number] with an amplitude of fibrillation which is 0.9 impedance of 439.  The preexisting ventricular lead St. Jude 1688 TC serial number [id number] demonstrated an amplitude of 17.1 with pace impedance of 1 volt of 0.4 milliseconds.  Current threshold was 585 mA and the current there was 1.7 mA.  With these acceptable parameters recorded, the leads were attached to an accident RF pulse generator model [id number], serial number [id number].  Ventricular pacing was identified.  The pocket was extended caudally to allow  for housing the larger pulse generator.  Hemostasis was obtained.  The leads and pulse generator were placed in the pocket and closed in three layers in the normal fashion.  The wound was washed, dried, and a benzoin Steri-Strip dressing was applied.  Needle counts, sponge counts, and instrument counts were correct at the end of the procedure according to the staff.  The patient tolerated the procedure without apparent complications.     Duke Salvia, MD, [facility]     SCK/MEDQ  D:  04/14/2011  T:  04/14/2011  Job:  [id number]  Electronically Signed by Sherryl Manges MD [facility] on 05/03/2011 04:03:32 PM

## 2011-05-05 ENCOUNTER — Ambulatory Visit (INDEPENDENT_AMBULATORY_CARE_PROVIDER_SITE_OTHER): Payer: Medicare Other | Admitting: *Deleted

## 2011-05-05 DIAGNOSIS — I4891 Unspecified atrial fibrillation: Secondary | ICD-10-CM

## 2011-05-11 ENCOUNTER — Telehealth: Payer: Self-pay | Admitting: Cardiology

## 2011-05-11 NOTE — Telephone Encounter (Signed)
Pt needs to clarify something with you

## 2011-05-11 NOTE — Telephone Encounter (Signed)
Has appointment with Dr Debby Bud in October.

## 2011-05-14 LAB — CBC
HCT: 40.1
Hemoglobin: 13.4
MCHC: 33.3
MCV: 91.9
RBC: 4.36

## 2011-05-14 LAB — BASIC METABOLIC PANEL
CO2: 29
Chloride: 103
GFR calc Af Amer: >60
Glucose, Bld: 118 — ABNORMAL HIGH
Sodium: 137

## 2011-05-25 ENCOUNTER — Telehealth: Payer: Self-pay | Admitting: Cardiology

## 2011-05-25 NOTE — Telephone Encounter (Signed)
Advised 

## 2011-05-25 NOTE — Telephone Encounter (Signed)
Schedule to see Dr Debby Bud (new patient appt) in October and with you in November.  Does he need both appointments or can he put off appointment here?   For him and Mrs. Mcwilliams

## 2011-05-25 NOTE — Telephone Encounter (Signed)
They can put off the appointments here.

## 2011-05-25 NOTE — Telephone Encounter (Signed)
Pt calling wanting to speak with nurse about appt she scheduled. Please return pt call.

## 2011-05-26 ENCOUNTER — Ambulatory Visit (INDEPENDENT_AMBULATORY_CARE_PROVIDER_SITE_OTHER): Payer: Medicare Other | Admitting: *Deleted

## 2011-05-26 DIAGNOSIS — I4891 Unspecified atrial fibrillation: Secondary | ICD-10-CM

## 2011-06-02 ENCOUNTER — Encounter: Payer: Medicare Other | Admitting: *Deleted

## 2011-06-03 ENCOUNTER — Ambulatory Visit: Payer: Medicare Other | Admitting: Internal Medicine

## 2011-06-23 ENCOUNTER — Ambulatory Visit (INDEPENDENT_AMBULATORY_CARE_PROVIDER_SITE_OTHER): Payer: Medicare Other | Admitting: *Deleted

## 2011-06-23 DIAGNOSIS — I4891 Unspecified atrial fibrillation: Secondary | ICD-10-CM

## 2011-06-23 LAB — POCT INR: INR: 2

## 2011-07-07 ENCOUNTER — Ambulatory Visit: Payer: Medicare Other | Admitting: Cardiology

## 2011-07-21 ENCOUNTER — Encounter: Payer: Medicare Other | Admitting: *Deleted

## 2011-07-26 ENCOUNTER — Ambulatory Visit (INDEPENDENT_AMBULATORY_CARE_PROVIDER_SITE_OTHER): Payer: Medicare Other | Admitting: *Deleted

## 2011-07-26 DIAGNOSIS — I4891 Unspecified atrial fibrillation: Secondary | ICD-10-CM

## 2011-07-26 LAB — POCT INR: INR: 2

## 2011-08-10 DIAGNOSIS — J189 Pneumonia, unspecified organism: Secondary | ICD-10-CM

## 2011-08-10 HISTORY — DX: Pneumonia, unspecified organism: J18.9

## 2011-08-13 ENCOUNTER — Encounter (INDEPENDENT_AMBULATORY_CARE_PROVIDER_SITE_OTHER): Payer: Self-pay | Admitting: Surgery

## 2011-08-13 ENCOUNTER — Encounter: Payer: Self-pay | Admitting: Internal Medicine

## 2011-08-13 ENCOUNTER — Ambulatory Visit (INDEPENDENT_AMBULATORY_CARE_PROVIDER_SITE_OTHER): Payer: Medicare Other | Admitting: Surgery

## 2011-08-13 ENCOUNTER — Ambulatory Visit (INDEPENDENT_AMBULATORY_CARE_PROVIDER_SITE_OTHER): Payer: Medicare Other | Admitting: Internal Medicine

## 2011-08-13 DIAGNOSIS — K4090 Unilateral inguinal hernia, without obstruction or gangrene, not specified as recurrent: Secondary | ICD-10-CM | POA: Insufficient documentation

## 2011-08-13 DIAGNOSIS — I4891 Unspecified atrial fibrillation: Secondary | ICD-10-CM

## 2011-08-13 DIAGNOSIS — Q078 Other specified congenital malformations of nervous system: Secondary | ICD-10-CM

## 2011-08-13 DIAGNOSIS — I10 Essential (primary) hypertension: Secondary | ICD-10-CM | POA: Insufficient documentation

## 2011-08-13 DIAGNOSIS — I495 Sick sinus syndrome: Secondary | ICD-10-CM

## 2011-08-13 DIAGNOSIS — Z950 Presence of cardiac pacemaker: Secondary | ICD-10-CM

## 2011-08-13 DIAGNOSIS — I959 Hypotension, unspecified: Secondary | ICD-10-CM

## 2011-08-13 LAB — PACEMAKER DEVICE OBSERVATION
AL AMPLITUDE: 0.8 mv
BATTERY VOLTAGE: 2.9629 V
BRDY-0002RV: 60 {beats}/min
DEVICE MODEL PM: 7268137
RV LEAD AMPLITUDE: 12 mv
RV LEAD THRESHOLD: 0.75 V

## 2011-08-13 NOTE — Assessment & Plan Note (Signed)
Relatively stable at this point. He has significant systolic hypertension and we will decrease his Mestinon from 10-5  3 times a day. We'll need to follow his dizziness alerted to let us know.

## 2011-08-13 NOTE — Patient Instructions (Signed)
Your physician wants you to follow-up in: September 2013 with Dr. Graciela Husbands. You will receive a reminder letter in the mail two months in advance. If you don't receive a letter, please call our office to schedule the follow-up appointment.  Your physician has recommended you make the following change in your medication:  1) Stop digoxin (lanoxin). 2) Decrease midodrine (proamitine) to 10mg  1/2 tablet by mouth three times a day.

## 2011-08-13 NOTE — Assessment & Plan Note (Signed)
Permanent will continue on warfarin. We will discontinue his digoxin.

## 2011-08-13 NOTE — Progress Notes (Signed)
Re:   Marco Gregory DOB:   August 30, 1920 MRN:   [id number]  ASSESSMENT AND PLAN: 1.  Left inguinal hernia, large.  I discussed the indications and complications of hernia surgery with the patient.  I discussed both the laparoscopic and open approach to hernia repair..  The potential risks of hernia surgery include, but are not limited to, bleeding, infection, open surgery, nerve injury, and recurrence of the hernia.  I provided the patient literature about hernia surgery.  I think because of his anti coagulation, he is not a good candidate for lap repair.  Will schedule after seen by Dr. Wylene Simmer.  2.  Sleep apnea.  Has CPAP, but does not wear it. 3.  Anticoagulated.  Discussed findings with Dr. Patty Sermons.  Dr. Yevonne Pax office will contact him to do an EKG.  If in A.Fib., may require transition with lovenox.  If not, can probably stop coumadin 5-7 days ahead. 4.  Bladder problems. Was followed by Dr. Aldean Ast.  Now followed by Dr. Fernande Boyden. 5.  Pacemaker.  Generator replaced Sept 2012 by Dr. Odessa Fleming. 6.  Hypertension. 7.  Paroxysmal atrial fibrillation. 8.  Spinal stenosis.  Followed by Drs. Ellsner and Love.  Injections have not helped symptoms. 9.  Dizziness.  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   REFERRING PHYSICIAN: Cassell Clement, MD, MD  HISTORY OF PRESENT ILLNESS: Marco Gregory is a 78 y.o. (DOB: November 06, 1920)  white male whose primary care physician is Cassell Clement, MD, MD and comes to me today for left inguinal hernia.  The patient had a right inguinal hernia repaired by Dr. Francina Ames in 2000. About 2 weeks ago he noticed a bulge in his left groin. He's had pain that he describes as "burns likely poker". When he lies down the bulge in his left groin goes away.  He had an appendectomy in his 37s. He has no history of peptic ulcer disease, liver disease, gallbladder  disease, pancreas disease, or colon disease. He denies having a prior colonoscopy.    Past Medical History  Diagnosis Date  . Sleep apnea   . Campath-induced atrial fibrillation   . Long-term (current) use of anticoagulants   . Glaucoma   . Gout   . Hyperthyroidism   . Orthostatic hypotension   . Redundant prepuce and phimosis   . Retention of urine, unspecified   . Acute cystitis   . Urgency of urination   . Hypertonicity of bladder   . Bradycardia        . HTN (hypertension)   . Atelectasis   . Asthmatic bronchitis   . Dysautonomia   . Pacemaker     St. Jude; device generator replacement 2012      Past Surgical History  Procedure Date  . Appendectomy   . Hernia repair   . Tonsillectomy   . Insert / replace / remove pacemaker 07/04/2002    dual chamber pacemaker - St. Jude model 1688 TC 58 cm active fixation lead was placed into the right ventricle and the St Jude model 1688 TC 52 cm active fixation pacing lead was placed in the right atrium.  The ventricular lead serial number was [id number] and the atrial lead serial number was [id number]/  Of note, the pacemaker generator was an identity [id number] serial number [id number]   . Cardiac catheterization 07/03/2002    EF 60-70% /  Normal left ventricular function. / Very mild trivial  three-vessel coronary atherosclerosis. / There is no mitral regurgitation noted  . Cardioversion 12/19/2003    cardioversion and pacemaker interrogation  . Elbow bursa surgery 04/07/2006    Right elbow septic olecranon bursitis  . Toe amputation     Osteomyelitis, right foot second toe /  Right foot second ray amputation  . Eye surgery 01/2005  . Pacemaker insertion 07/04/2002      Current Outpatient Prescriptions  Medication Sig Dispense Refill  . Acetaminophen (TYLENOL PO) Take 1-2 tablets by mouth as needed.        . dorzolamide-timolol (COSOPT) 22.3-6.8 MG/ML ophthalmic solution Place 1 drop into both eyes daily.        . finasteride (PROSCAR) 5  MG tablet Take 5 mg by mouth daily.        . fludrocortisone (FLORINEF) 0.1mg /mL SUSP Take by mouth daily.        [name] levothyroxine (SYNTHROID, LEVOTHROID) 175 MCG tablet Take 175 mcg by mouth daily.        . midodrine (PROAMATINE) 10 MG tablet Take 1/2 tablet by mouth at 8am, 12pm, and 4pm      . pyridostigmine (MESTINON) 60 MG tablet Take one tablet every morning & one tablet at lunch.      . warfarin (COUMADIN) 2.5 MG tablet Take 2.5 mg by mouth. As directed by the [facility]          No Known Allergies  REVIEW OF SYSTEMS: Skin:  No history of rash.  No history of abnormal moles. Infection:  No history of hepatitis or HIV.  No history of MRSA. Neurologic:  No history of stroke.  No history of seizure.  No history of headaches. Cardiac:  Pacemaker followed by Dr. Odessa Fleming.  Dr. Wylene Simmer follows him for his heart also.  Has paroxysmal A. Fib.  On coumadin for this. Pulmonary:  Quit smoking 1967.  No asthma or bronchitis.  No OSA/CPAP.  Endocrine:  No diabetes. Had radioactive iodine in 1970 at [facility], now on thyroid replacement. Gastrointestinal:  See HPI. Urologic:  Has bladder issues.  Symptoms improved with Rapiflow. Musculoskeletal:  Spinal stenosis.  Followed by Drs. Elsner/Love.  Uses a cane for stability. Hematologic:  Anticoagulated, on coumadin. Psycho-social:  The patient is oriented.   The patient has no obvious psychologic or social impairment to understanding our conversation and plan.  SOCIAL and FAMILY HISTORY: Wife, grace, with patient.  PHYSICAL EXAM: BP 138/84  Pulse 72  Temp 97.9 F (36.6 C)  Ht 6\' 3"  (1.905 m)  Wt 216 lb 12.8 oz (98.34 kg)  BMI 27.10 kg/m2  General: WN older WM who is alert and generally healthy appearing. He does use a cane to walk because of spinal stenosis. HEENT: Normal. Pupils equal. Good dentition. Neck: Supple. No mass.  No thyroid mass.  Carotid pulse okay with no bruit. Lymph Nodes:  No supraclavicular or cervical  nodes. Lungs: Clear to auscultation and symmetric breath sounds. Heart:  RRR. No murmur or rub.  Has left upper chest pacemaker.  Abdomen: Soft. No mass. No tenderness. No hernia. Normal bowel sounds.  RLQ and right inguinal scar.  Has large left inguinal hernia, partially reducible.  The patient says it reduces when he lays down. Rectal: Not done. Extremities:  Good strength and ROM  in upper and lower extremities.  Though uses a cane to walk. Neurologic:  Grossly intact to motor and sensory function. Psychiatric: Has normal mood and affect. Behavior is normal.   DATA REVIEWED:  Notes from Dr. Graciela Husbands.  Ovidio Kin, MD,  [facility] [address]  [address]   [place], [place]    [address] Phone:  [phone] FAX:  [phone]

## 2011-08-13 NOTE — Patient Instructions (Addendum)
1. Contact Dr. Yevonne Pax office about EKG.  He will determine how to stop Coumadin (do you need a transition medication).  2.  After he has decided about the coumadin, we can schedule the hernia surgery.

## 2011-08-13 NOTE — Assessment & Plan Note (Signed)
The patient's device was interrogated.  The information was reviewed. No changes were made in the programming.    

## 2011-08-13 NOTE — Assessment & Plan Note (Signed)
The patient has significant systolic hypertension. We will decrease his ProAmatine as noted above. He will follow at home. We'll shoot for target systolic of about 160.

## 2011-08-13 NOTE — Progress Notes (Signed)
HPI  Marco Gregory is a 78 y.o. 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 At his last visit we really configured his dosing intervals for his ProAmatine and Mestinon with significant decrease in his dizziness.  His major issue now is that he has a hernia for which he is seeking surgical opinions this afternoon.  Past Medical History  Diagnosis Date  . Sleep apnea   . Campath-induced atrial fibrillation   . Long-term (current) use of anticoagulants   . Glaucoma   . Gout   . Hyperthyroidism   . Orthostatic hypotension   . Redundant prepuce and phimosis   . Retention of urine, unspecified   . Acute cystitis   . Urgency of urination   . Hypertonicity of bladder   . Bradycardia      Bradycardia status post pacemaker implantation  . HTN (hypertension)   . Atelectasis   . Asthmatic bronchitis   . Dysautonomia     Past Surgical History  Procedure Date  . Appendectomy   . Hernia repair   . Tonsillectomy   . Insert / replace / remove pacemaker 07/04/2002    dual chamber pacemaker - St. Jude model 1688 TC 58 cm active fixation lead was placed into the right ventricle and the St Jude model 1688 TC 52 cm active fixation pacing lead was placed in the right atrium.  The ventricular lead serial number was [id number] and the atrial lead serial number was [id number]/  Of note, the pacemaker generator was an identity [id number] serial number [id number]   . Cardiac catheterization 07/03/2002    EF 60-70% /  Normal left ventricular function. / Very mild trivial three-vessel coronary atherosclerosis. / There is no mitral regurgitation noted    . Cardioversion 12/19/2003    cardioversion and pacemaker interrogation  . Elbow bursa surgery 04/07/2006    Right elbow septic olecranon bursitis  . Toe amputation     Osteomyelitis, right foot second toe /  Right foot second ray amputation  . Eye surgery 01/2005  . Pacemaker insertion 07/04/2002    Current Outpatient Prescriptions  Medication Sig Dispense Refill  . Acetaminophen (TYLENOL PO) Take 1-2 tablets by mouth as needed.        . digoxin (LANOXIN) 0.125 MG tablet Take 125 mcg by mouth daily.        . dorzolamide-timolol (COSOPT) 22.3-6.8 MG/ML ophthalmic solution Place 1 drop into both eyes daily.        . finasteride (PROSCAR) 5 MG tablet Take 5 mg by mouth daily.        . fludrocortisone (FLORINEF) 0.1mg /mL SUSP Take by mouth daily.        [name] HYDROcodone-acetaminophen (VICODIN) 5-500 MG per tablet Take 1 tablet by mouth 2 (two) times daily.       [name] levothyroxine (SYNTHROID, LEVOTHROID) 175 MCG tablet Take 175 mcg by mouth daily.        . midodrine (PROAMATINE) 10 MG tablet Take one tablet by mouth at 8am, 12pm, & 4pm      . pyridostigmine (MESTINON) 60 MG tablet Take one tablet every morning & one tablet at lunch.      . warfarin (  COUMADIN) 2.5 MG tablet Take 2.5 mg by mouth. As directed by the [facility]         No Known Allergies  Review of Systems negative except from HPI and PMH  Physical Exam Well developed and well nourished in no acute distress HENT normal E scleral and icterus clear Neck Supple JVP flat; carotids brisk and full Clear to ausculation Regular rate and rhythm, no murmurs gallops or rub Soft with active bowel sounds No clubbing cyanosis none Edema Alert and oriented, grossly normal motor and sensory function Skin Warm and Dry   Assessment and  Plan

## 2011-08-16 ENCOUNTER — Encounter: Payer: Self-pay | Admitting: Cardiology

## 2011-08-16 ENCOUNTER — Ambulatory Visit (INDEPENDENT_AMBULATORY_CARE_PROVIDER_SITE_OTHER): Payer: Medicare Other | Admitting: Cardiology

## 2011-08-16 DIAGNOSIS — I4891 Unspecified atrial fibrillation: Secondary | ICD-10-CM

## 2011-08-16 DIAGNOSIS — I959 Hypotension, unspecified: Secondary | ICD-10-CM

## 2011-08-16 NOTE — Patient Instructions (Signed)
Your physician recommends that you continue on your current medications as directed. Please refer to the Current Medication list given to you today.  Your physician wants you to follow-up in: 3 months. You will receive a reminder letter in the mail two months in advance. If you don't receive a letter, please call our office to schedule the follow-up appointment.  

## 2011-08-16 NOTE — Assessment & Plan Note (Signed)
The patient has a past history of low blood pressure but more recently his blood pressure has been high.  It has come down since his midodrine dose was cut in half.  The patient has not been experiencing any recent dizzy spells

## 2011-08-16 NOTE — Progress Notes (Signed)
Marco Gregory Date of Birth:  [birth year] [facility] [address] [place], [place]  [address] [phone]  Fax   [phone]  HPI: This pleasant 78 year old gentleman is seen for a walk-in office visit.  He was recently found to have a left inguinal hernia.  He will require surgery by Dr. Ezzard Standing.  The patient is on long-term Coumadin because of atrial fibrillation.  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  Current Outpatient Prescriptions  Medication Sig Dispense Refill  . Acetaminophen (TYLENOL PO) Take 1-2 tablets by mouth as needed.        . dorzolamide-timolol (COSOPT) 22.3-6.8 MG/ML ophthalmic solution Place 1 drop into both eyes daily.        . finasteride (PROSCAR) 5 MG tablet Take 5 mg by mouth daily.        . fludrocortisone (FLORINEF) 0.1 MG tablet Take 0.1 mg by mouth daily.        [name] levothyroxine (SYNTHROID, LEVOTHROID) 175 MCG tablet Take 175 mcg by mouth daily.        . midodrine (PROAMATINE) 10 MG tablet Take 1/2 tablet by mouth at 8am, 12pm, and 4pm      . pyridostigmine (MESTINON) 60 MG tablet Take one tablet every morning & one tablet at lunch.      . warfarin (COUMADIN) 2.5 MG tablet Take 2.5 mg by mouth. As directed by the [facility]         No Known Allergies  Patient Active Problem List  Diagnoses  . OBSTRUCTIVE SLEEP APNEA  . Hypotension-orthostatic  . Atrial fibrillation  . SICK SINUS/ TACHY-BRADY SYNDROME  . ASTHMATIC BRONCHITIS, ACUTE  . ATELECTASIS  . DYSAUTONOMIA  . PACEMAKER, PERMANENT  . Overactive bladder  . Hoarseness of voice  . Hypertension  . Inguinal hernia, left    History  Smoking status  . Former Smoker  . Quit date: 08/09/1965  Smokeless tobacco  . Not on file    History  Alcohol Use No    Family History  Problem Relation Age of Onset  . Cancer      Review of Systems: The patient denies any heat or cold intolerance.  No weight gain or weight  loss.  The patient denies headaches or blurry vision.  There is no cough or sputum production.  The patient denies dizziness.  There is no hematuria or hematochezia.  The patient denies any muscle aches or arthritis.  The patient denies any rash.  The patient denies frequent falling or instability.  There is no history of depression or anxiety.  All other systems were reviewed and are negative.   Physical Exam: Filed Vitals:   08/16/11 1408  BP: 130/80  Pulse: 65   on physical examination patient is in no acute distress.  Pupils equal and reactive.   Extraocular Movements are full.  There is no scleral icterus.  The mouth and pharynx are normal.  The neck is supple.  The carotids reveal no bruits.  The jugular venous pressure is normal.  The thyroid is not enlarged.  There is no lymphadenopathy.  The chest is clear to percussion and auscultation. There are no rales or rhonchi. Expansion of the chest is symmetrical.  The precordium is quiet.  The first heart sound is normal.  The second heart sound is physiologically split.  There is no murmur gallop rub or click.  There is no abnormal lift or heave.  The abdomen is  soft and nontender. Bowel sounds are normal. The liver and spleen are not enlarged. There Are no abdominal masses. There are no bruits.  He does have a left inguinal hernia about the size of a lemon.  It is not tender today. The pedal pulses are good.  There is no phlebitis or edema.  There is no cyanosis or clubbing. Strength is normal and symmetrical in all extremities.  There is no lateralizing weakness.  There are no sensory deficits.  EKG today shows atrial fibrillation with a paced ventricular rhythm.      Assessment / Plan: The patient has a satisfactory risk for hernia surgery.  We will plan to stop his Coumadin 5 days before surgery.  He does not require Lovenox bridging.  He will restart his Coumadin as soon as possible following surgery.

## 2011-08-17 ENCOUNTER — Telehealth: Payer: Self-pay | Admitting: Cardiology

## 2011-08-17 ENCOUNTER — Ambulatory Visit: Payer: Self-pay | Admitting: Internal Medicine

## 2011-08-17 ENCOUNTER — Telehealth (INDEPENDENT_AMBULATORY_CARE_PROVIDER_SITE_OTHER): Payer: Self-pay

## 2011-08-17 NOTE — Telephone Encounter (Signed)
Marco Gregory called to let us know he went to Dr Cyndia Skeeters for cardiac clearance. He was given the ok for surgery, to stop coumadin 5 days before surgery and no lovenox bridge needed. His office visit is in epic. You can write orders for surgery and put in epic when you get a chance.

## 2011-08-17 NOTE — Telephone Encounter (Signed)
Message sent to Coumadin clinic in error.  Pt saw Dr Patty Sermons yesterday 08/16/11 and was cleared for upcoming surgery with Dr Ovidio Kin, instructed ok to hold Coumadin x 5 days prior, no Lovenox bridge necessary, resume Coumadin as soon as possible after surgery. Pt states Dr Jenness Corner not needs to be sent to Dr Onalee Hua Newman/Surgical center so surgery can be scheduled.  Will forward message to Dr Yevonne Pax nurse with request for office note to be faxed.

## 2011-08-17 NOTE — Telephone Encounter (Signed)
Pt yesterday and had coumadin ck and report was to be sent to the surgical center and they have  not heard

## 2011-08-18 ENCOUNTER — Other Ambulatory Visit (INDEPENDENT_AMBULATORY_CARE_PROVIDER_SITE_OTHER): Payer: Self-pay | Admitting: Surgery

## 2011-08-18 NOTE — Telephone Encounter (Signed)
Sent office note on 1/7 and again today

## 2011-08-19 ENCOUNTER — Other Ambulatory Visit (INDEPENDENT_AMBULATORY_CARE_PROVIDER_SITE_OTHER): Payer: Self-pay | Admitting: Surgery

## 2011-08-20 ENCOUNTER — Encounter (HOSPITAL_COMMUNITY): Payer: Self-pay | Admitting: Pharmacy Technician

## 2011-08-23 ENCOUNTER — Telehealth: Payer: Self-pay | Admitting: Cardiology

## 2011-08-23 NOTE — Telephone Encounter (Signed)
The Florinef makes his blood pressure go up.  Since his blood pressure is high he should stop the Florinef.  Also leave off the midodrine if his systolic pressure is more than 140

## 2011-08-23 NOTE — Telephone Encounter (Signed)
blood pressure has been running up to 180-185/100-105  HR around 75 and is concerned with upcoming surgery.  Please advise

## 2011-08-23 NOTE — Telephone Encounter (Signed)
New msg Pt said he is having surgery this week and wanted to talk to you

## 2011-08-23 NOTE — Telephone Encounter (Signed)
Advised of changes, verbalized understanding.  Wife will bring blood pressure cuff to her appointment here 1/16 to make sure working properly

## 2011-08-24 ENCOUNTER — Other Ambulatory Visit (HOSPITAL_COMMUNITY): Payer: Medicare Other

## 2011-08-25 ENCOUNTER — Encounter (HOSPITAL_COMMUNITY)
Admission: RE | Admit: 2011-08-25 | Discharge: 2011-08-25 | Disposition: A | Discharge status: Home / Self Care | Payer: Medicare Other | Source: Ambulatory Visit | Attending: Surgery | Admitting: Surgery

## 2011-08-25 ENCOUNTER — Telehealth: Payer: Self-pay | Admitting: *Deleted

## 2011-08-25 ENCOUNTER — Encounter (HOSPITAL_COMMUNITY): Payer: Self-pay

## 2011-08-25 ENCOUNTER — Ambulatory Visit (INDEPENDENT_AMBULATORY_CARE_PROVIDER_SITE_OTHER): Payer: Medicare Other | Admitting: *Deleted

## 2011-08-25 ENCOUNTER — Other Ambulatory Visit: Payer: Self-pay

## 2011-08-25 DIAGNOSIS — I4891 Unspecified atrial fibrillation: Secondary | ICD-10-CM

## 2011-08-25 HISTORY — DX: Heart failure, unspecified: I50.9

## 2011-08-25 HISTORY — DX: Chronic kidney disease, unspecified: N18.9

## 2011-08-25 LAB — PROTIME-INR
INR: 1.1 (ref 0.00–1.49)
Prothrombin Time: 14.4 seconds (ref 11.6–15.2)

## 2011-08-25 LAB — SURGICAL PCR SCREEN: Staphylococcus aureus: POSITIVE — AB

## 2011-08-25 LAB — BASIC METABOLIC PANEL
Calcium: 9.7 mg/dL (ref 8.4–10.5)
GFR calc non Af Amer: 72 mL/min — ABNORMAL LOW (ref >90)
Sodium: 138 mEq/L (ref 135–145)

## 2011-08-25 LAB — CBC
Platelets: 146 10*3/uL — ABNORMAL LOW (ref 150–400)
RBC: 4.4 MIL/uL (ref 4.22–5.81)
WBC: 5.8 10*3/uL (ref 4.0–10.5)

## 2011-08-25 LAB — APTT: aPTT: 39 seconds — ABNORMAL HIGH (ref 24–37)

## 2011-08-25 MED ORDER — CHLORHEXIDINE GLUCONATE 4 % EX LIQD: 1.0000 "application " | Freq: Once | CUTANEOUS | Status: DC

## 2011-08-25 MED ORDER — CEFAZOLIN SODIUM-DEXTROSE 2-3 GM-% IV SOLR
2.0000 g | INTRAVENOUS | Status: DC
  Filled 2011-08-25: qty 50

## 2011-08-25 NOTE — Consult Note (Signed)
Anesthesia:  Patient is an 78 year old male scheduled for a left IHR on 08/26/11.  His history includes afib, glaucoma, hyperthyroidism, orthostatic hypotension (improved with medication adjustment), CHF, OSA without current use of CPAP in 3 years, and tachybradycardia syndrome s/p [employer] dual chamber PPM.  He denies any previous Anesthesia complications.  His primary Cardiologist is Dr. Patty Sermons who cleared him for this procedure.  His EP Cardiologist is Dr. Graciela Husbands.  According to Dr. Yevonne Pax 08/15/09 office note his EKG then showed a-fib with a paced ventricular rhythm.  The office could not locate the EKG today, however.  We did another EKG today that again showed afib with a rate of 85, LAD, right BBB.  He denies any changes in his CV status since seeing Dr. Patty Sermons.  He does have DOE which he feels is at his baseline.  He has not had any recent chest pain or dizziness.  His last echo was from 01/28/05 and showed: - Overall left ventricular systolic function was normal. Left ventricular ejection fraction was estimated , range being 50 % to 55 %.. There was no diagnostic evidence of left ventricular regional wall motion abnormalities. - Mild Aortic Valve thickening. There was moderate aortic valvular regurgitation. - Smoke seen in Descending Aorta. Mild atherosclerosis noted in descending and thoracic aorta. - There was a mild mitral valve prolapse. There was mild to moderate mitral valvular regurgitation. - Left atrial size was at the upper limits of normal. There was no left atrial appendage thrombus identified. - Normal tricuspid valve. - There was the appearance of a catheter or pacing wire in the right atrium. - Pt in and out of atrial fibrillation, aflutter throughout study.  His last cath was from 07/03/02 and showed normal LV function and very mild trivial 3V CAD.  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 Pacemaker leads are unchanged in position.  He had somewhat  coarse breath sounds in the posterior RUL, but otherwise sounds were clear.  Labs reviewed.  Plan to proceed.

## 2011-08-25 NOTE — Pre-Procedure Instructions (Signed)
20 Marco Gregory  08/25/2011   Your procedure is scheduled on: Thursday 08/26/11   Report to [facility] at 530 AM.  Call this number if you have problems the morning of surgery: [phone]   Remember:   Do not eat food:After Midnight.  May have clear liquids: up to 4 Hours before arrival.  Clear liquids include soda, tea, black coffee, apple or grape juice, broth.  Take these medicines the morning of surgery with A SIP OF WATER: TYLENOL, EYE DROPS, PROSCAR, SYNTHROID, FLOMAX,      STOP ASPIRIN, COUMADIN ,PLAVIX, EFFIENT, HERBAL MEDICINES   Do not wear jewelry, make-up or nail polish.  Do not wear lotions, powders, or perfumes. You may wear deodorant.  Do not shave 48 hours prior to surgery.  Do not bring valuables to the hospital.  Contacts, dentures or bridgework may not be worn into surgery.  Leave suitcase in the car. After surgery it may be brought to your room.  For patients admitted to the hospital, checkout time is 11:00 AM the day of discharge.   Patients discharged the day of surgery will not be allowed to drive home.  Name and phone number of your driver:   Special Instructions: CHG Shower Use Special Wash: 1/2 bottle night before surgery and 1/2 bottle morning of surgery.   Please read over the following fact sheets that you were given: Pain Booklet, Coughing and Deep Breathing, MRSA Information and Surgical Site Infection Prevention

## 2011-08-25 NOTE — Telephone Encounter (Signed)
blood pressure today 140/80 when he was here with wife for her office visit.  States he has been off his florinef and midodrine.  Per  Dr. Patty Sermons he is to continue off of the medications.  Advised to get a new blood pressure cuff since his did not check out with ours today and monitor at home

## 2011-08-25 NOTE — Telephone Encounter (Signed)
Agree with advice given

## 2011-08-26 ENCOUNTER — Encounter (HOSPITAL_COMMUNITY)
Admission: RE | Disposition: A | Discharge status: Home / Self Care | Payer: Self-pay | Source: Ambulatory Visit | Attending: Surgery

## 2011-08-26 ENCOUNTER — Encounter (HOSPITAL_COMMUNITY): Payer: Self-pay | Admitting: Vascular Surgery

## 2011-08-26 ENCOUNTER — Ambulatory Visit (HOSPITAL_COMMUNITY): Payer: Medicare Other | Admitting: Vascular Surgery

## 2011-08-26 ENCOUNTER — Ambulatory Visit (HOSPITAL_COMMUNITY)
Admission: RE | Admit: 2011-08-26 | Discharge: 2011-08-27 | Disposition: A | Discharge status: Home / Self Care | Payer: Medicare Other | Source: Ambulatory Visit | Attending: Surgery | Admitting: Surgery

## 2011-08-26 ENCOUNTER — Other Ambulatory Visit (INDEPENDENT_AMBULATORY_CARE_PROVIDER_SITE_OTHER): Payer: Self-pay | Admitting: Surgery

## 2011-08-26 ENCOUNTER — Encounter (HOSPITAL_COMMUNITY): Payer: Self-pay | Admitting: General Practice

## 2011-08-26 ENCOUNTER — Encounter (HOSPITAL_COMMUNITY): Payer: Self-pay | Admitting: *Deleted

## 2011-08-26 DIAGNOSIS — K4090 Unilateral inguinal hernia, without obstruction or gangrene, not specified as recurrent: Secondary | ICD-10-CM | POA: Diagnosis present

## 2011-08-26 DIAGNOSIS — I5031 Acute diastolic (congestive) heart failure: Principal | ICD-10-CM | POA: Diagnosis present

## 2011-08-26 DIAGNOSIS — I129 Hypertensive chronic kidney disease with stage 1 through stage 4 chronic kidney disease, or unspecified chronic kidney disease: Secondary | ICD-10-CM | POA: Diagnosis present

## 2011-08-26 DIAGNOSIS — I4891 Unspecified atrial fibrillation: Secondary | ICD-10-CM | POA: Diagnosis present

## 2011-08-26 DIAGNOSIS — M4800 Spinal stenosis, site unspecified: Secondary | ICD-10-CM | POA: Insufficient documentation

## 2011-08-26 DIAGNOSIS — K219 Gastro-esophageal reflux disease without esophagitis: Secondary | ICD-10-CM | POA: Diagnosis present

## 2011-08-26 DIAGNOSIS — I959 Hypotension, unspecified: Secondary | ICD-10-CM | POA: Diagnosis present

## 2011-08-26 DIAGNOSIS — I495 Sick sinus syndrome: Secondary | ICD-10-CM

## 2011-08-26 DIAGNOSIS — J45909 Unspecified asthma, uncomplicated: Secondary | ICD-10-CM | POA: Diagnosis present

## 2011-08-26 DIAGNOSIS — Z950 Presence of cardiac pacemaker: Secondary | ICD-10-CM

## 2011-08-26 DIAGNOSIS — I10 Essential (primary) hypertension: Secondary | ICD-10-CM | POA: Insufficient documentation

## 2011-08-26 DIAGNOSIS — Z87891 Personal history of nicotine dependence: Secondary | ICD-10-CM

## 2011-08-26 DIAGNOSIS — Q078 Other specified congenital malformations of nervous system: Secondary | ICD-10-CM

## 2011-08-26 DIAGNOSIS — Z7901 Long term (current) use of anticoagulants: Secondary | ICD-10-CM

## 2011-08-26 DIAGNOSIS — Z01812 Encounter for preprocedural laboratory examination: Secondary | ICD-10-CM | POA: Insufficient documentation

## 2011-08-26 DIAGNOSIS — N189 Chronic kidney disease, unspecified: Secondary | ICD-10-CM | POA: Diagnosis present

## 2011-08-26 DIAGNOSIS — I509 Heart failure, unspecified: Secondary | ICD-10-CM | POA: Diagnosis present

## 2011-08-26 DIAGNOSIS — Z01810 Encounter for preprocedural cardiovascular examination: Secondary | ICD-10-CM | POA: Insufficient documentation

## 2011-08-26 DIAGNOSIS — G4730 Sleep apnea, unspecified: Secondary | ICD-10-CM | POA: Insufficient documentation

## 2011-08-26 DIAGNOSIS — E0590 Thyrotoxicosis, unspecified without thyrotoxic crisis or storm: Secondary | ICD-10-CM | POA: Diagnosis present

## 2011-08-26 DIAGNOSIS — R42 Dizziness and giddiness: Secondary | ICD-10-CM | POA: Insufficient documentation

## 2011-08-26 LAB — CREATININE, SERUM
Creatinine, Ser: 0.87 mg/dL (ref 0.50–1.35)
GFR calc Af Amer: 87 mL/min — ABNORMAL LOW (ref >90)
GFR calc non Af Amer: 75 mL/min — ABNORMAL LOW (ref >90)

## 2011-08-26 SURGERY — REPAIR, HERNIA, INGUINAL, ADULT
Anesthesia: General | Site: Groin | Laterality: Left | Wound class: Clean

## 2011-08-26 MED ORDER — HYDROCODONE-ACETAMINOPHEN 5-325 MG PO TABS
1.0000 | ORAL_TABLET | ORAL | Status: DC | PRN
  Administered 2011-08-26: 1 via ORAL
  Administered 2011-08-27: 2 via ORAL
  Filled 2011-08-26: qty 2
  Filled 2011-08-26: qty 1

## 2011-08-26 MED ORDER — PHENYLEPHRINE HCL 10 MG/ML IJ SOLN
INTRAMUSCULAR | Status: DC | PRN
  Administered 2011-08-26 (×6): 80 ug via INTRAVENOUS

## 2011-08-26 MED ORDER — MUPIROCIN 2 % EX OINT
TOPICAL_OINTMENT | CUTANEOUS | Status: AC
  Administered 2011-08-26: 07:00:00
  Filled 2011-08-26: qty 22

## 2011-08-26 MED ORDER — ONDANSETRON HCL 4 MG/2ML IJ SOLN
INTRAMUSCULAR | Status: DC | PRN
  Administered 2011-08-26: 4 mg via INTRAVENOUS

## 2011-08-26 MED ORDER — VANCOMYCIN HCL IN DEXTROSE 1-5 GM/200ML-% IV SOLN
INTRAVENOUS | Status: AC
  Filled 2011-08-26: qty 200

## 2011-08-26 MED ORDER — HYDROMORPHONE HCL PF 1 MG/ML IJ SOLN: 0.2500 mg | INTRAMUSCULAR | Status: DC | PRN

## 2011-08-26 MED ORDER — ONDANSETRON HCL 4 MG PO TABS: 4.0000 mg | ORAL_TABLET | Freq: Four times a day (QID) | ORAL | Status: DC | PRN

## 2011-08-26 MED ORDER — ONDANSETRON HCL 4 MG/2ML IJ SOLN: 4.0000 mg | Freq: Four times a day (QID) | INTRAMUSCULAR | Status: DC | PRN

## 2011-08-26 MED ORDER — IBUPROFEN 600 MG PO TABS
600.0000 mg | ORAL_TABLET | Freq: Four times a day (QID) | ORAL | Status: DC | PRN
  Filled 2011-08-26: qty 1

## 2011-08-26 MED ORDER — 0.9 % SODIUM CHLORIDE (POUR BTL) OPTIME
TOPICAL | Status: DC | PRN
  Administered 2011-08-26: 1000 mL

## 2011-08-26 MED ORDER — ACETAMINOPHEN 10 MG/ML IV SOLN
INTRAVENOUS | Status: DC | PRN
  Administered 2011-08-26: 1000 mg via INTRAVENOUS

## 2011-08-26 MED ORDER — KCL IN DEXTROSE-NACL 20-5-0.45 MEQ/L-%-% IV SOLN
INTRAVENOUS | Status: DC
  Administered 2011-08-26 – 2011-08-27 (×2): via INTRAVENOUS
  Filled 2011-08-26 (×3): qty 1000

## 2011-08-26 MED ORDER — ONDANSETRON HCL 4 MG/2ML IJ SOLN
4.0000 mg | Freq: Once | INTRAMUSCULAR | Status: AC | PRN
  Administered 2011-08-26: 4 mg via INTRAVENOUS

## 2011-08-26 MED ORDER — HEPARIN SODIUM (PORCINE) 5000 UNIT/ML IJ SOLN
5000.0000 [IU] | Freq: Three times a day (TID) | INTRAMUSCULAR | Status: DC
  Administered 2011-08-26 – 2011-08-27 (×2): 5000 [IU] via SUBCUTANEOUS
  Filled 2011-08-26 (×7): qty 1

## 2011-08-26 MED ORDER — PROPOFOL 10 MG/ML IV BOLUS
INTRAVENOUS | Status: DC | PRN
  Administered 2011-08-26: 170 mg via INTRAVENOUS

## 2011-08-26 MED ORDER — MORPHINE SULFATE 2 MG/ML IJ SOLN: 2.0000 mg | INTRAMUSCULAR | Status: DC | PRN

## 2011-08-26 MED ORDER — LACTATED RINGERS IV SOLN
INTRAVENOUS | Status: DC | PRN
  Administered 2011-08-26: 07:00:00 via INTRAVENOUS

## 2011-08-26 MED ORDER — MUPIROCIN 2 % EX OINT
TOPICAL_OINTMENT | Freq: Two times a day (BID) | CUTANEOUS | Status: DC
  Administered 2011-08-26: 1 via NASAL
  Filled 2011-08-26: qty 22

## 2011-08-26 MED ORDER — LIDOCAINE HCL (CARDIAC) 20 MG/ML IV SOLN
INTRAVENOUS | Status: DC | PRN
  Administered 2011-08-26: 40 mg via INTRAVENOUS

## 2011-08-26 MED ORDER — FENTANYL CITRATE 0.05 MG/ML IJ SOLN
INTRAMUSCULAR | Status: DC | PRN
  Administered 2011-08-26: 100 ug via INTRAVENOUS

## 2011-08-26 MED ORDER — VANCOMYCIN HCL 1000 MG IV SOLR
1000.0000 mg | INTRAVENOUS | Status: DC | PRN
  Administered 2011-08-26: 1000 mg via INTRAVENOUS

## 2011-08-26 MED ORDER — BUPIVACAINE LIPOSOME 1.3 % IJ SUSP
20.0000 mL | INTRAMUSCULAR | Status: AC
  Administered 2011-08-26: 266 mg
  Filled 2011-08-26: qty 20

## 2011-08-26 MED ORDER — ACETAMINOPHEN 10 MG/ML IV SOLN
INTRAVENOUS | Status: AC
  Filled 2011-08-26: qty 100

## 2011-08-26 SURGICAL SUPPLY — 53 items
ADH SKN CLS APL DERMABOND .7 (GAUZE/BANDAGES/DRESSINGS) ×2
APL SKNCLS STERI-STRIP NONHPOA (GAUZE/BANDAGES/DRESSINGS) ×1
BENZOIN TINCTURE PRP APPL 2/3 (GAUZE/BANDAGES/DRESSINGS) ×2 IMPLANT
BLADE SURG 10 STRL SS (BLADE) ×2 IMPLANT
BLADE SURG 15 STRL LF DISP TIS (BLADE) ×1 IMPLANT
BLADE SURG 15 STRL SS (BLADE) ×2
BLADE SURG ROTATE 9660 (MISCELLANEOUS) ×1 IMPLANT
CANISTER SUCTION 2500CC (MISCELLANEOUS) ×1 IMPLANT
CHLORAPREP W/TINT 26ML (MISCELLANEOUS) ×2 IMPLANT
CLOTH BEACON ORANGE TIMEOUT ST (SAFETY) ×2 IMPLANT
COVER SURGICAL LIGHT HANDLE (MISCELLANEOUS) ×2 IMPLANT
DECANTER SPIKE VIAL GLASS SM (MISCELLANEOUS) IMPLANT
DERMABOND ADVANCED (GAUZE/BANDAGES/DRESSINGS) ×2
DERMABOND ADVANCED .7 DNX12 (GAUZE/BANDAGES/DRESSINGS) IMPLANT
DRAIN PENROSE 1/2X12 LTX STRL (WOUND CARE) ×1 IMPLANT
DRAPE LAPAROTOMY TRNSV 102X78 (DRAPE) ×2 IMPLANT
ELECT CAUTERY BLADE 6.4 (BLADE) ×2 IMPLANT
ELECT REM PT RETURN 9FT ADLT (ELECTROSURGICAL) ×2
ELECTRODE REM PT RTRN 9FT ADLT (ELECTROSURGICAL) ×1 IMPLANT
GLOVE BIOGEL PI IND STRL 7.0 (GLOVE) IMPLANT
GLOVE BIOGEL PI INDICATOR 7.0 (GLOVE) ×1
GLOVE SURG SIGNA 7.5 PF LTX (GLOVE) ×2 IMPLANT
GLOVE SURG SS PI 6.5 STRL IVOR (GLOVE) ×1 IMPLANT
GOWN STRL NON-REIN LRG LVL3 (GOWN DISPOSABLE) ×2 IMPLANT
GOWN STRL REIN XL XLG (GOWN DISPOSABLE) ×2 IMPLANT
KIT BASIN OR (CUSTOM PROCEDURE TRAY) ×2 IMPLANT
KIT ROOM TURNOVER OR (KITS) ×2 IMPLANT
MESH ULTRAPRO 3X6 7.6X15CM (Mesh General) ×1 IMPLANT
NDL HYPO 25GX1X1/2 BEV (NEEDLE) ×1 IMPLANT
NEEDLE HYPO 25GX1X1/2 BEV (NEEDLE) ×2 IMPLANT
NS IRRIG 1000ML POUR BTL (IV SOLUTION) ×2 IMPLANT
PACK SURGICAL SETUP 50X90 (CUSTOM PROCEDURE TRAY) ×2 IMPLANT
PAD ARMBOARD 7.5X6 YLW CONV (MISCELLANEOUS) ×4 IMPLANT
PENCIL BUTTON HOLSTER BLD 10FT (ELECTRODE) ×2 IMPLANT
SPECIMEN JAR SMALL (MISCELLANEOUS) IMPLANT
SPONGE GAUZE 4X4 12PLY (GAUZE/BANDAGES/DRESSINGS) ×2 IMPLANT
SPONGE INTESTINAL PEANUT (DISPOSABLE) ×2 IMPLANT
SPONGE LAP 18X18 X RAY DECT (DISPOSABLE) ×2 IMPLANT
STRIP CLOSURE SKIN 1/2X4 (GAUZE/BANDAGES/DRESSINGS) ×2 IMPLANT
SUT NOVA 0 T19/GS 22DT (SUTURE) ×3 IMPLANT
SUT NOVA NAB GS-21 0 18 T12 DT (SUTURE) ×1 IMPLANT
SUT VIC AB 2-0 SH 27 (SUTURE)
SUT VIC AB 2-0 SH 27X BRD (SUTURE) IMPLANT
SUT VIC AB 3-0 SH 18 (SUTURE) ×2 IMPLANT
SUT VIC AB 5-0 PS2 18 (SUTURE) ×2 IMPLANT
SUT VICRYL AB 2 0 TIES (SUTURE) ×2 IMPLANT
SYR BULB 3OZ (MISCELLANEOUS) ×2 IMPLANT
SYR CONTROL 10ML LL (SYRINGE) ×2 IMPLANT
TOWEL OR 17X24 6PK STRL BLUE (TOWEL DISPOSABLE) ×2 IMPLANT
TOWEL OR 17X26 10 PK STRL BLUE (TOWEL DISPOSABLE) ×2 IMPLANT
TUBE CONNECTING 12X1/4 (SUCTIONS) ×1 IMPLANT
WATER STERILE IRR 1000ML POUR (IV SOLUTION) IMPLANT
YANKAUER SUCT BULB TIP NO VENT (SUCTIONS) ×1 IMPLANT

## 2011-08-26 NOTE — Brief Op Note (Signed)
08/26/2011  9:15 AM  PATIENT:  Marco Gregory, 78 y.o., male, MRN: [id number]  PREOP DIAGNOSIS:  left inguinal hernia  POSTOP DIAGNOSIS:   Left inguinal hernia, large indirect, small direct  PROCEDURE:   Procedure(s): Left open HERNIA REPAIR INGUINAL ADULT  SURGEON:   Ovidio Kin, M.D.  ASSISTANT:   none  ANESTHESIA:   general  Melonie Florida, MD - Anesthesiologist Caryn Bee, CRNA - CRNA  General  EBL:  minimal  ml  BLOOD ADMINISTERED: none  DRAINS: none   LOCAL MEDICATIONS USED:   20 cc Experel  SPECIMEN:   Hernia sac  COUNTS CORRECT:  YES  INDICATIONS FOR PROCEDURE:  Marco Gregory is a 78 y.o. (DOB: [birth year]) white male whose primary care physician is Cassell Clement, MD, MD and comes for left inguinal hernia repair   The indications and risks of the surgery were explained to the patient.  The risks include, but are not limited to, infection, bleeding, and nerve injury.  Note dictated to:   #[id number]  08/26/2011  DN

## 2011-08-26 NOTE — Progress Notes (Signed)
Admitted to room 5126 from PACU. Ambulated to BR and voided without difficulty. A/Ox4. Family at bedside. Oriented to room and surroundings. Denies nausea/pain at this time

## 2011-08-26 NOTE — Anesthesia Preprocedure Evaluation (Signed)
Anesthesia Evaluation  Patient identified by MRN, date of birth, ID band Patient awake    Reviewed: Allergy & Precautions, H&P , NPO status   Airway Mallampati: II      Dental  (+) Teeth Intact   Pulmonary  clear to auscultation        Cardiovascular Regular Normal    Neuro/Psych    GI/Hepatic   Endo/Other    Renal/GU      Musculoskeletal   Abdominal   Peds  Hematology   Anesthesia Other Findings   Reproductive/Obstetrics                           Anesthesia Physical Anesthesia Plan  ASA: III  Anesthesia Plan: General   Post-op Pain Management:    Induction: Intravenous  Airway Management Planned: LMA  Additional Equipment:   Intra-op Plan:   Post-operative Plan:   Informed Consent: I have reviewed the patients History and Physical, chart, labs and discussed the procedure including the risks, benefits and alternatives for the proposed anesthesia with the patient or authorized representative who has indicated his/her understanding and acceptance.   Dental advisory given  Plan Discussed with:   Anesthesia Plan Comments:         Anesthesia Quick Evaluation

## 2011-08-26 NOTE — Anesthesia Postprocedure Evaluation (Signed)
  Anesthesia Post-op Note  Patient: Marco Gregory  Procedure(s) Performed:  HERNIA REPAIR INGUINAL ADULT - open left inguinal hernia  Patient Location: PACU  Anesthesia Type: General  Level of Consciousness: awake, alert  and oriented  Airway and Oxygen Therapy: Patient Spontanous Breathing  Post-op Pain: mild  Post-op Assessment: Post-op Vital signs reviewed  Post-op Vital Signs: stable  Complications: No apparent anesthesia complications

## 2011-08-26 NOTE — Preoperative (Signed)
Beta Blockers   Reason not to administer Beta Blockers:Not Applicable 

## 2011-08-26 NOTE — Anesthesia Procedure Notes (Signed)
Procedure Name: LMA Insertion Date/Time: 08/26/2011 7:45 AM Performed by: Caryn Bee Pre-anesthesia Checklist: Patient identified, Emergency Drugs available, Suction available, Patient being monitored and Timeout performed Patient Re-evaluated:Patient Re-evaluated prior to inductionOxygen Delivery Method: Circle System Utilized Preoxygenation: Pre-oxygenation with 100% oxygen Intubation Type: IV induction LMA: LMA inserted LMA Size: 4.0 Number of attempts: 1 Placement Confirmation: positive ETCO2 Tube secured with: Tape Dental Injury: Teeth and Oropharynx as per pre-operative assessment

## 2011-08-26 NOTE — Op Note (Signed)
NAMERAUDEL, BAZEN NO.:  [id number]  MEDICAL RECORD NO.:  [id number]  LOCATION:  [facility]                         FACILITY:  [facility]  PHYSICIAN:  Sandria Bales. Ezzard Standing, M.D.       DATE OF BIRTH:  DATE OF PROCEDURE: 26 Aug 2011                              OPERATIVE REPORT   PREOPERATIVE DIAGNOSIS:  Left inguinal hernia.  POSTOP DIAGNOSIS:  Large indirect and small direct left inguinal hernia.  PROCEDURE:  Open left inguinal hernia repair with mesh.  SURGEON:  Sandria Bales. Ezzard Standing, M.D.  FIRST ASSISTANT:  None.  ANESTHESIA:  General LMA.  COMPLICATIONS:  None.  INDICATION FOR PROCEDURE:  Mr. Langlois is an 78 year old white male who is a patient of Dr. Cassell Clement, who presents with a symptomatic left inguinal hernia.  He has been on Coumadin for atrial fibrillation, this has been stopped.  His INR is normalized.  He also tested positive for MRSA.  So, he is taking or using mupirocin nasally.  The patient now comes for attempted left inguinal repair.  The indications of potential complications of hernia repair were explained to the patient.  Potential complications include, but are not limited to bleeding, infection, nerve injury, and recurrence of the hernia.  OPERATIVE NOTE:  The patient was placed in a supine position in room #17, underwent a general LMA anesthesia supervised by Dr. Kipp Brood.  A time-out was held and surgical checklist run.  Because he tested positive for MRSA, he was given 1 g of vancomycin as his preop antibiotic.  His abdomen was shaved, prepped with ChloraPrep and sterilely draped.  I made a left inguinal incision made with sharp dissection and carried down to the external oblique fascia, which was opened.  The cord structures were encircled with a Penrose drain.  He has inguinal floor of bulge and he seemed to have a small direct inguinal hernia.  However, his main hernia component was that of a fairly large indirect  inguinal hernia that exceeded through the internal ring, went down the cord structures.  This was teased away from the cord structures.  The sac was opened, a finger was entered into the peritoneal cavity. There was no mass or nodularity.  The hernia sac itself was probably 14- 15 cm along, twisted the sac, ligated with a 2-0 Vicryl suture and amputated it.  He does have a fair amount of fat around his cord, which was left intact.  I then carried out an inguinal floor repair using a piece of UltraPro mesh.  I used a 3 x 6-inch mesh cut to the shape of the inguinal floor.  I sewed the mesh medially to the pubic tubercle inferiorly to the shelving edge of the inguinal ligament, superiorly to the transversalis fascia, and I cut a keyhole for the internal Ring.  I used 0 Novafil to sew the mesh in with.  The mesh lay flat.  I tried to make the keyhole around the cord structures snug, but not too tight.  I then irrigated the wound.  I closed the external oblique fascia recreating an external ring.  I then used Exparel mixed 20 mL with 10 mL of saline for total of 30 mL  of Exparel solution.  I infiltrated the deep fascial layers, the subcutaneous layers using entire 30 mL.  I closed the subcutaneous tissues with 3-0 Vicryl suture.  I closed the skin with a 5-0 running subcuticular suture and painted the wound with Dermabond.  The patient tolerated the procedure well, was transported to the recovery room in good condition.    I think because of his age, history of urinary retention and BPH, and his cardiac history, I will plan to keep him overnight for observation.  Sandria Bales. Ezzard Standing, M.D., FACS   DHN/MEDQ  D:  08/26/2011  T:  08/26/2011  Job:  [id number]  cc:   Cassell Clement, M.D. Duke Salvia, MD, [facility] Genene Churn. Love, M.D. Jerilee Field, MD

## 2011-08-26 NOTE — Transfer of Care (Signed)
Immediate Anesthesia Transfer of Care Note  Patient: Marco Gregory  Procedure(s) Performed:  HERNIA REPAIR INGUINAL ADULT - open left inguinal hernia  Patient Location: PACU  Anesthesia Type: General  Level of Consciousness: awake, alert  and oriented  Airway & Oxygen Therapy: Patient Spontanous Breathing and Patient connected to nasal cannula oxygen  Post-op Assessment: Report given to PACU RN  Post vital signs: Reviewed and stable Filed Vitals:   08/26/11 0616  BP: 147/92  Pulse: 79  Temp: 36.4 C  Resp: 18    Complications: No apparent anesthesia complications

## 2011-08-26 NOTE — H&P (View-Only) (Signed)
 Re:   Marco Gregory DOB:   11/26/1920 MRN:   [id number]  ASSESSMENT AND PLAN: 1.  Left inguinal hernia, large.  I discussed the indications and complications of hernia surgery with the patient.  I discussed both the laparoscopic and open approach to hernia repair..  The potential risks of hernia surgery include, but are not limited to, bleeding, infection, open surgery, nerve injury, and recurrence of the hernia.  I provided the patient literature about hernia surgery.  I think because of his anti coagulation, he is not a good candidate for lap repair.  Will schedule after seen by Dr. T. Brackbill.  2.  Sleep apnea.  Has CPAP, but does not wear it. 3.  Anticoagulated.  Discussed findings with Dr. Brackbill.  Dr. Brackbill's office will contact him to do an EKG.  If in A.Fib., may require transition with lovenox.  If not, can probably stop coumadin 5-7 days ahead. 4.  Bladder problems. Was followed by Dr. Kimbrough.  Now followed by Dr. Eskrigde. 5.  Pacemaker.  Generator replaced Sept 2012 by Dr. S. Klein. 6.  Hypertension. 7.  Paroxysmal atrial fibrillation. 8.  Spinal stenosis.  Followed by Drs. Ellsner and Love.  Injections have not helped symptoms. 9.  Dizziness.  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   REFERRING PHYSICIAN: Thomas Brackbill, MD, MD  HISTORY OF PRESENT ILLNESS: Marco Gregory is a 78 y.o. (DOB: 08/16/1920)  white male whose primary care physician is Thomas Brackbill, MD, MD and comes to me today for left inguinal hernia.  The patient had a right inguinal hernia repaired by Dr. Peter Young in 2000. About 2 weeks ago he noticed a bulge in his left groin. He's had pain that he describes as "burns likely poker". When he lies down the bulge in his left groin goes away.  He had an appendectomy in his 20s. He has no history of peptic ulcer disease, liver disease, gallbladder  disease, pancreas disease, or colon disease. He denies having a prior colonoscopy.    Past Medical History  Diagnosis Date  . Sleep apnea   . Campath-induced atrial fibrillation   . Long-term (current) use of anticoagulants   . Glaucoma   . Gout   . Hyperthyroidism   . Orthostatic hypotension   . Redundant prepuce and phimosis   . Retention of urine, unspecified   . Acute cystitis   . Urgency of urination   . Hypertonicity of bladder   . Bradycardia        . HTN (hypertension)   . Atelectasis   . Asthmatic bronchitis   . Dysautonomia   . Pacemaker     St. Jude; device generator replacement 2012      Past Surgical History  Procedure Date  . Appendectomy   . Hernia repair   . Tonsillectomy   . Insert / replace / remove pacemaker 07/04/2002    dual chamber pacemaker - St. Jude model 1688 TC 58 cm active fixation lead was placed into the right ventricle and the St Jude model 1688 TC 52 cm active fixation pacing lead was placed in the right atrium.  The ventricular lead serial number was [id number] and the atrial lead serial number was [id number]/  Of note, the pacemaker generator was an identity [id number] serial number [id number]   . Cardiac catheterization 07/03/2002    EF 60-70% /  Normal left ventricular function. / Very mild trivial   three-vessel coronary atherosclerosis. / There is no mitral regurgitation noted  . Cardioversion 12/19/2003    cardioversion and pacemaker interrogation  . Elbow bursa surgery 04/07/2006    Right elbow septic olecranon bursitis  . Toe amputation     Osteomyelitis, right foot second toe /  Right foot second ray amputation  . Eye surgery 01/2005  . Pacemaker insertion 07/04/2002      Current Outpatient Prescriptions  Medication Sig Dispense Refill  . Acetaminophen (TYLENOL PO) Take 1-2 tablets by mouth as needed.        . dorzolamide-timolol (COSOPT) 22.3-6.8 MG/ML ophthalmic solution Place 1 drop into both eyes daily.        . finasteride (PROSCAR) 5  MG tablet Take 5 mg by mouth daily.        . fludrocortisone (FLORINEF) 0.1mg/mL SUSP Take by mouth daily.        . levothyroxine (SYNTHROID, LEVOTHROID) 175 MCG tablet Take 175 mcg by mouth daily.        . midodrine (PROAMATINE) 10 MG tablet Take 1/2 tablet by mouth at 8am, 12pm, and 4pm      . pyridostigmine (MESTINON) 60 MG tablet Take one tablet every morning & one tablet at lunch.      . warfarin (COUMADIN) 2.5 MG tablet Take 2.5 mg by mouth. As directed by the [facility]          No Known Allergies  REVIEW OF SYSTEMS: Skin:  No history of rash.  No history of abnormal moles. Infection:  No history of hepatitis or HIV.  No history of MRSA. Neurologic:  No history of stroke.  No history of seizure.  No history of headaches. Cardiac:  Pacemaker followed by Dr. S. Klein.  Dr. T. Brackbill follows him for his heart also.  Has paroxysmal A. Fib.  On coumadin for this. Pulmonary:  Quit smoking 1967.  No asthma or bronchitis.  No OSA/CPAP.  Endocrine:  No diabetes. Had radioactive iodine in 1970 at [facility], now on thyroid replacement. Gastrointestinal:  See HPI. Urologic:  Has bladder issues.  Symptoms improved with Rapiflow. Musculoskeletal:  Spinal stenosis.  Followed by Drs. Elsner/Love.  Uses a cane for stability. Hematologic:  Anticoagulated, on coumadin. Psycho-social:  The patient is oriented.   The patient has no obvious psychologic or social impairment to understanding our conversation and plan.  SOCIAL and FAMILY HISTORY: Wife, grace, with patient.  PHYSICAL EXAM: BP 138/84  Pulse 72  Temp 97.9 F (36.6 C)  Ht 6' 3" (1.905 m)  Wt 216 lb 12.8 oz (98.34 kg)  BMI 27.10 kg/m2  General: WN older WM who is alert and generally healthy appearing. He does use a cane to walk because of spinal stenosis. HEENT: Normal. Pupils equal. Good dentition. Neck: Supple. No mass.  No thyroid mass.  Carotid pulse okay with no bruit. Lymph Nodes:  No supraclavicular or cervical  nodes. Lungs: Clear to auscultation and symmetric breath sounds. Heart:  RRR. No murmur or rub.  Has left upper chest pacemaker.  Abdomen: Soft. No mass. No tenderness. No hernia. Normal bowel sounds.  RLQ and right inguinal scar.  Has large left inguinal hernia, partially reducible.  The patient says it reduces when he lays down. Rectal: Not done. Extremities:  Good strength and ROM  in upper and lower extremities.  Though uses a cane to walk. Neurologic:  Grossly intact to motor and sensory function. Psychiatric: Has normal mood and affect. Behavior is normal.   DATA REVIEWED:   Notes from Dr. Klein.  Toshie Demelo, MD,  FACS [facility] [address]  [address]   [place],     [address] Phone:  [phone] FAX:  [phone]  

## 2011-08-26 NOTE — Interval H&P Note (Signed)
History and Physical Interval Note:  08/26/2011 7:24 AM  Marco Gregory  has presented today for surgery, with the diagnosis of left inguinal hernia.  His INR is normalized.  He did test positive for MRSA.  The various methods of treatment have been discussed with the patient and family. After consideration of risks, benefits and other options for treatment, the patient has consented to  Procedure(s): HERNIA REPAIR INGUINAL ADULT as a surgical intervention .    The patients' history has been reviewed, patient examined, no change in status, stable for surgery.  I have reviewed the patients' chart and labs.  Questions were answered to the patient's satisfaction.     Tawanda Schall H

## 2011-08-27 MED ORDER — WARFARIN SODIUM 2.5 MG PO TABS: 2.5000 mg | ORAL_TABLET | Freq: Every day | ORAL | Status: DC

## 2011-08-27 MED ORDER — HYDROCODONE-ACETAMINOPHEN 5-325 MG PO TABS: 1.0000 | ORAL_TABLET | Freq: Four times a day (QID) | ORAL | Status: DC | PRN

## 2011-08-27 NOTE — Progress Notes (Signed)
Patient has done well. Ready for d/c. Instructions reviewed. D/C dictated - [id number].

## 2011-08-27 NOTE — Progress Notes (Signed)
Patient discharged, home discharge instruction given, no questions verbalized. Alert and oriented, not in any distress,denies pain, ambulatory prior to discharge.

## 2011-08-27 NOTE — Discharge Summary (Signed)
Marco Gregory, Marco Gregory NO.:  [id number]  MEDICAL RECORD NO.:  [id number]  LOCATION:  5126                         FACILITY:  [facility]  PHYSICIAN:  Sandria Bales. Ezzard Standing, M.D.  DATE OF BIRTH:  12-26-1920  DATE OF ADMISSION:  08/26/2011 DATE OF DISCHARGE:  27 Aug 2011                              DISCHARGE SUMMARY  DISCHARGE DIAGNOSES: 1. Indirect and direct left inguinal hernia. 2. Sleep apnea. 3. History of atrial fibrillation, on anticoagulation. 4. History of benign prostatic hypertrophy and bladder problems. 5. Pacemaker. 6. Hypertension. 7. History of spinal stenosis. 8. History of dizziness, attributed to autoimmune orthostatic     hypotension.  OPERATIONS PERFORMED:  The patient had an open left inguinal hernia repair with mesh repair by Dr. Ovidio Kin on August 26, 2011.  HISTORY OF ILLNESS:  [name] is an 78 year old white male who sees Dr. Cassell Clement from a medical standpoint.  He has had an increasingly symptomatic left inguinal hernia, and comes to the hospital for repair of this hernia.  SIGNIFICANT MEDICAL HISTORY: 1. History is sleep apnea.  He has CPAP, but he does not wear. 2. He is anticoagulated for atrial fibrillation.  Anticoagulation has     stopped 5 days prior to admission. 3. He has a prostate/bladder problems followed by Dr. Mena Goes. 4. He has a pacemaker, replaced the battery generator in 2012 by Dr.     Graciela Husbands. 5. He has hypertension. 6. He has history of paroxysmal atrial fibrillation. 7. He has spinal stenosis.  He had been by Dr. Barnett Abu and Dr.     Avie Echevaria. 8. He has a history of dizziness, which he says is due to autoimmune     orthostatic hypotension.  HOSPITAL COURSE:  The patient was taken to the operating room on the day of admission where he went underwent an open left inguinal hernia repair.  Postoperatively, I kept him overnight mainly because he has a significant cardiac history, he has history of  bladder/urinary problems, and impending inclement weather.  He is now 1 day postop, doing well and ready for discharge.  DISCHARGE INSTRUCTIONS: 1. He can resume all home medicines except for his Coumadin.  He will start that on Sunday, January 20.  He knows to contact the [facility] for his next blood check. 2. I will give him Vicodin for pain. 3. He is to do no driving for 3-5 days or until his discomfort is  resolved. 4. He is to do no heavy lifting for 4 weeks. 5. He is to see me back in 2-3 weeks for followup.  He knows to call     for interval problem.  Sandria Bales. Ezzard Standing, M.D., FACS  DHN/MEDQ  D:  08/27/2011  T:  08/27/2011  Job:  [id number]  cc:   Cassell Clement, M.D. Genene Churn. Love, M.D. Jerilee Field, MD Duke Salvia, MD, [facility]

## 2011-08-28 ENCOUNTER — Inpatient Hospital Stay (HOSPITAL_COMMUNITY)
Admission: EM | Admit: 2011-08-28 | Discharge: 2011-09-01 | DRG: 989 | Disposition: A | Discharge status: Home Health | Payer: Medicare Other | Attending: Internal Medicine | Admitting: Internal Medicine

## 2011-08-28 ENCOUNTER — Encounter (HOSPITAL_COMMUNITY): Payer: Self-pay

## 2011-08-28 ENCOUNTER — Other Ambulatory Visit: Payer: Self-pay

## 2011-08-28 ENCOUNTER — Emergency Department (HOSPITAL_COMMUNITY): Payer: Medicare Other

## 2011-08-28 ENCOUNTER — Telehealth (INDEPENDENT_AMBULATORY_CARE_PROVIDER_SITE_OTHER): Payer: Self-pay | Admitting: General Surgery

## 2011-08-28 DIAGNOSIS — Z9889 Other specified postprocedural states: Secondary | ICD-10-CM

## 2011-08-28 DIAGNOSIS — I10 Essential (primary) hypertension: Secondary | ICD-10-CM

## 2011-08-28 DIAGNOSIS — I495 Sick sinus syndrome: Secondary | ICD-10-CM

## 2011-08-28 DIAGNOSIS — J209 Acute bronchitis, unspecified: Secondary | ICD-10-CM | POA: Diagnosis present

## 2011-08-28 DIAGNOSIS — I4891 Unspecified atrial fibrillation: Secondary | ICD-10-CM | POA: Diagnosis present

## 2011-08-28 DIAGNOSIS — R079 Chest pain, unspecified: Secondary | ICD-10-CM | POA: Diagnosis present

## 2011-08-28 DIAGNOSIS — J811 Chronic pulmonary edema: Secondary | ICD-10-CM

## 2011-08-28 DIAGNOSIS — R0602 Shortness of breath: Secondary | ICD-10-CM | POA: Diagnosis present

## 2011-08-28 DIAGNOSIS — R0600 Dyspnea, unspecified: Secondary | ICD-10-CM

## 2011-08-28 DIAGNOSIS — Z8719 Personal history of other diseases of the digestive system: Secondary | ICD-10-CM

## 2011-08-28 DIAGNOSIS — I959 Hypotension, unspecified: Secondary | ICD-10-CM | POA: Diagnosis present

## 2011-08-28 DIAGNOSIS — Q078 Other specified congenital malformations of nervous system: Secondary | ICD-10-CM

## 2011-08-28 DIAGNOSIS — Z950 Presence of cardiac pacemaker: Secondary | ICD-10-CM

## 2011-08-28 LAB — DIFFERENTIAL
Basophils Absolute: 0 10*3/uL (ref 0.0–0.1)
Basophils Relative: 0 % (ref 0–1)
Lymphocytes Relative: 10 % — ABNORMAL LOW (ref 12–46)
Monocytes Relative: 11 % (ref 3–12)
Neutro Abs: 7.9 10*3/uL — ABNORMAL HIGH (ref 1.7–7.7)
Neutrophils Relative %: 78 % — ABNORMAL HIGH (ref 43–77)

## 2011-08-28 LAB — CBC
HCT: 41.9 % (ref 39.0–52.0)
Hemoglobin: 13.5 g/dL (ref 13.0–17.0)
Hemoglobin: 13.7 g/dL (ref 13.0–17.0)
MCH: 31.7 pg (ref 26.0–34.0)
MCHC: 33.3 g/dL (ref 30.0–36.0)
MCV: 97 fL (ref 78.0–100.0)
Platelets: 148 10*3/uL — ABNORMAL LOW (ref 150–400)
RBC: 4.32 MIL/uL (ref 4.22–5.81)
WBC: 10.2 10*3/uL (ref 4.0–10.5)
WBC: 8 10*3/uL (ref 4.0–10.5)

## 2011-08-28 LAB — CREATININE, SERUM: GFR calc Af Amer: 88 mL/min — ABNORMAL LOW (ref >90)

## 2011-08-28 LAB — CARDIAC PANEL(CRET KIN+CKTOT+MB+TROPI)
CK, MB: 3 ng/mL (ref 0.3–4.0)
Total CK: 41 U/L (ref 7–232)
Troponin I: <0.3 ng/mL (ref <0.30)

## 2011-08-28 LAB — URINALYSIS, ROUTINE W REFLEX MICROSCOPIC
Nitrite: NEGATIVE
Specific Gravity, Urine: 1.019 (ref 1.005–1.030)
pH: 7 (ref 5.0–8.0)

## 2011-08-28 LAB — BASIC METABOLIC PANEL
Chloride: 100 mEq/L (ref 96–112)
GFR calc Af Amer: >90 mL/min (ref >90)
Potassium: 4.2 mEq/L (ref 3.5–5.1)

## 2011-08-28 LAB — URINE MICROSCOPIC-ADD ON

## 2011-08-28 MED ORDER — IPRATROPIUM BROMIDE 0.02 % IN SOLN
0.5000 mg | Freq: Four times a day (QID) | RESPIRATORY_TRACT | Status: DC
  Administered 2011-08-28 – 2011-08-29 (×5): 0.5 mg via RESPIRATORY_TRACT
  Filled 2011-08-28 (×4): qty 2.5

## 2011-08-28 MED ORDER — FUROSEMIDE 10 MG/ML IJ SOLN
40.0000 mg | Freq: Once | INTRAMUSCULAR | Status: AC
  Administered 2011-08-28: 40 mg via INTRAVENOUS
  Filled 2011-08-28: qty 4

## 2011-08-28 MED ORDER — DORZOLAMIDE HCL-TIMOLOL MAL 2-0.5 % OP SOLN
1.0000 [drp] | Freq: Every day | OPHTHALMIC | Status: DC
  Filled 2011-08-28: qty 10

## 2011-08-28 MED ORDER — FINASTERIDE 5 MG PO TABS
5.0000 mg | ORAL_TABLET | Freq: Every day | ORAL | Status: DC
  Administered 2011-08-28 – 2011-09-01 (×5): 5 mg via ORAL
  Filled 2011-08-28 (×5): qty 1

## 2011-08-28 MED ORDER — MIDODRINE HCL 5 MG PO TABS
5.0000 mg | ORAL_TABLET | Freq: Three times a day (TID) | ORAL | Status: DC
  Administered 2011-08-28 – 2011-09-01 (×12): 5 mg via ORAL
  Filled 2011-08-28 (×14): qty 1

## 2011-08-28 MED ORDER — DORZOLAMIDE HCL 2 % OP SOLN
1.0000 [drp] | Freq: Three times a day (TID) | OPHTHALMIC | Status: DC
  Administered 2011-08-28 – 2011-09-01 (×8): 1 [drp] via OPHTHALMIC
  Filled 2011-08-28 (×3): qty 10

## 2011-08-28 MED ORDER — MOXIFLOXACIN HCL IN NACL 400 MG/250ML IV SOLN
400.0000 mg | Freq: Once | INTRAVENOUS | Status: AC
  Administered 2011-08-28: 400 mg via INTRAVENOUS
  Filled 2011-08-28: qty 250

## 2011-08-28 MED ORDER — CHLORHEXIDINE GLUCONATE CLOTH 2 % EX PADS
6.0000 | MEDICATED_PAD | Freq: Every day | CUTANEOUS | Status: DC
  Administered 2011-08-30 – 2011-09-01 (×3): 6 via TOPICAL

## 2011-08-28 MED ORDER — TIMOLOL MALEATE 0.5 % OP SOLN
1.0000 [drp] | Freq: Every day | OPHTHALMIC | Status: DC
  Administered 2011-08-28 – 2011-09-01 (×4): 1 [drp] via OPHTHALMIC
  Filled 2011-08-28 (×2): qty 5

## 2011-08-28 MED ORDER — LEVOTHYROXINE SODIUM 175 MCG PO TABS
175.0000 ug | ORAL_TABLET | Freq: Every day | ORAL | Status: DC
  Administered 2011-08-29 – 2011-09-01 (×4): 175 ug via ORAL
  Filled 2011-08-28 (×5): qty 1

## 2011-08-28 MED ORDER — ONDANSETRON HCL 4 MG/2ML IJ SOLN: 4.0000 mg | Freq: Four times a day (QID) | INTRAMUSCULAR | Status: DC | PRN

## 2011-08-28 MED ORDER — SODIUM CHLORIDE 0.9 % IJ SOLN: 3.0000 mL | INTRAMUSCULAR | Status: DC | PRN

## 2011-08-28 MED ORDER — ACETAMINOPHEN 500 MG PO TABS
500.0000 mg | ORAL_TABLET | Freq: Four times a day (QID) | ORAL | Status: DC | PRN
  Filled 2011-08-28: qty 2

## 2011-08-28 MED ORDER — PANTOPRAZOLE SODIUM 40 MG PO TBEC
40.0000 mg | DELAYED_RELEASE_TABLET | Freq: Every day | ORAL | Status: DC
  Administered 2011-08-29 – 2011-08-31 (×3): 40 mg via ORAL
  Filled 2011-08-28 (×3): qty 1

## 2011-08-28 MED ORDER — ALBUTEROL SULFATE (5 MG/ML) 0.5% IN NEBU
2.5000 mg | INHALATION_SOLUTION | Freq: Once | RESPIRATORY_TRACT | Status: AC
  Administered 2011-08-28: 2.5 mg via RESPIRATORY_TRACT
  Filled 2011-08-28: qty 1

## 2011-08-28 MED ORDER — ENOXAPARIN SODIUM 40 MG/0.4ML ~~LOC~~ SOLN
40.0000 mg | SUBCUTANEOUS | Status: DC
  Administered 2011-08-28 – 2011-08-31 (×4): 40 mg via SUBCUTANEOUS
  Filled 2011-08-28 (×5): qty 0.4

## 2011-08-28 MED ORDER — SILODOSIN 8 MG PO CAPS
8.0000 mg | ORAL_CAPSULE | Freq: Every day | ORAL | Status: DC
  Administered 2011-08-29 – 2011-09-01 (×4): 8 mg via ORAL
  Filled 2011-08-28 (×5): qty 1

## 2011-08-28 MED ORDER — SODIUM CHLORIDE 0.9 % IV SOLN: 250.0000 mL | INTRAVENOUS | Status: DC | PRN

## 2011-08-28 MED ORDER — IPRATROPIUM BROMIDE 0.02 % IN SOLN
0.5000 mg | Freq: Once | RESPIRATORY_TRACT | Status: AC
  Administered 2011-08-28: 0.5 mg via RESPIRATORY_TRACT
  Filled 2011-08-28: qty 2.5

## 2011-08-28 MED ORDER — LEVALBUTEROL HCL 0.63 MG/3ML IN NEBU
0.6300 mg | INHALATION_SOLUTION | Freq: Four times a day (QID) | RESPIRATORY_TRACT | Status: DC
  Administered 2011-08-28 – 2011-08-29 (×5): 0.63 mg via RESPIRATORY_TRACT
  Filled 2011-08-28 (×7): qty 3

## 2011-08-28 MED ORDER — MOXIFLOXACIN HCL IN NACL 400 MG/250ML IV SOLN
400.0000 mg | INTRAVENOUS | Status: DC
  Administered 2011-08-28 – 2011-08-30 (×3): 400 mg via INTRAVENOUS
  Filled 2011-08-28 (×4): qty 250

## 2011-08-28 MED ORDER — MUPIROCIN 2 % EX OINT
1.0000 "application " | TOPICAL_OINTMENT | Freq: Two times a day (BID) | CUTANEOUS | Status: DC
  Administered 2011-08-28 – 2011-09-01 (×8): 1 via TOPICAL
  Filled 2011-08-28 (×2): qty 22

## 2011-08-28 MED ORDER — SODIUM CHLORIDE 0.9 % IJ SOLN
3.0000 mL | Freq: Two times a day (BID) | INTRAMUSCULAR | Status: DC
  Administered 2011-08-28 – 2011-08-29 (×2): 3 mL via INTRAVENOUS

## 2011-08-28 MED ORDER — PYRIDOSTIGMINE BROMIDE 60 MG PO TABS
60.0000 mg | ORAL_TABLET | Freq: Two times a day (BID) | ORAL | Status: DC
  Administered 2011-08-28 – 2011-09-01 (×8): 60 mg via ORAL
  Filled 2011-08-28 (×9): qty 1

## 2011-08-28 MED ORDER — FLUDROCORTISONE ACETATE 0.1 MG PO TABS
0.1000 mg | ORAL_TABLET | Freq: Every day | ORAL | Status: DC
  Administered 2011-08-28 – 2011-09-01 (×5): 0.1 mg via ORAL
  Filled 2011-08-28 (×5): qty 1

## 2011-08-28 MED ORDER — ALUM & MAG HYDROXIDE-SIMETH 200-200-20 MG/5ML PO SUSP: 30.0000 mL | Freq: Four times a day (QID) | ORAL | Status: DC | PRN

## 2011-08-28 MED ORDER — FUROSEMIDE 10 MG/ML IJ SOLN
20.0000 mg | Freq: Every day | INTRAMUSCULAR | Status: DC
  Administered 2011-08-28 – 2011-08-30 (×3): 20 mg via INTRAVENOUS
  Filled 2011-08-28 (×3): qty 2

## 2011-08-28 MED ORDER — HYDROCODONE-ACETAMINOPHEN 5-325 MG PO TABS
1.0000 | ORAL_TABLET | Freq: Four times a day (QID) | ORAL | Status: DC | PRN
  Administered 2011-08-29: 1 via ORAL
  Filled 2011-08-28: qty 1

## 2011-08-28 NOTE — H&P (Signed)
PATIENT DETAILS Name: Marco Gregory Age: 78 y.o. Sex: male Date of Birth: March 21, 1921 Admit Date: 08/28/2011 ZOX:WRUEAV Brackbill, MD, MD   CHIEF COMPLAINT:  Heartburn and shortness of breath- since this  morning  HPI: Patient is a 78 year old Caucasian gentleman, with a long-standing history of atrial fibrillation on chronic Coumadin therapy which is currently on hold as the patient was just discharged from this hospital yesterday following a inguinal hernia repair, patient has a long-standing history of orthostatic hypotension who came back to the hospital today for the above-noted complaints. The patient after the being discharged he was relatively okay, however around 3 AM this morning patient woke up with heartburn and had a very bitter taste in his mouth. He some times short of breath, "I was wheezing". His family members and noticed him to have audible wheezing when due having breakfast this morning. Patient was also complaining of more tiredness and having shortness of breath on exertion-urine walking to the bathroom. He then presented to the hospital for further evaluation and treatment.The hospitalist service has not been asked to admit this patient for further evaluation and treatment. -Patient denies any active chest pain. He denies any leg edema. He has never known to have a history of congestive heart failure. He denies any fever, he does have a cough with some yellowish phlegm. -He denies any abdominal pain nausea vomiting or diarrhea. -He has a long-standing history of orthostatic hypotension, he has no known history of myasthenia gravis. He is taking Mestinon, midrodine and Florinef for orthostatic hypotension -He does have a history of sleep apnea, but has not been using his CPAP for the past 3 years.   ALLERGIES:  No Known Allergies  PAST MEDICAL HISTORY: Past Medical History  Diagnosis Date  . Atrial fibrillation   . Long-term (current) use of anticoagulants   .  Glaucoma   . Gout   . Hyperthyroidism   . Orthostatic hypotension   . Redundant prepuce and phimosis   . Retention of urine, unspecified   . Acute cystitis   . Urgency of urination   . Hypertonicity of bladder   . Bradycardia        . HTN (hypertension)   . Atelectasis   . Dysautonomia   . Pacemaker     St. Jude; device generator replacement 2012  . Asthmatic bronchitis   . CHF (congestive heart failure)   . Sleep apnea     does not use cpap not in 4 yrs  . Chronic kidney disease [facility]     UTI  1 MO AGO TX MEDICALLY     PAST SURGICAL HISTORY: Past Surgical History  Procedure Date  . Appendectomy   . Hernia repair   . Tonsillectomy   . Cardioversion 12/19/2003    cardioversion and pacemaker interrogation  . Elbow bursa surgery 04/07/2006    Right elbow septic olecranon bursitis  . Toe amputation     Osteomyelitis, right foot second toe /  Right foot second ray amputation  . Pacemaker insertion 07/04/2002  . Insert / replace / remove pacemaker 07/04/2002 dr Graciela Husbands     dual chamber pacemaker - St. Jude model 1688 TC 58 cm active fixation lead was placed into the right ventricle and the St Jude model 1688 TC 52 cm active fixation pacing lead was placed in the right atrium.  The ventricular lead serial number was [id number] and the atrial lead serial number was [id number]/  Of note, the pacemaker generator was an identity [id number]  serial number [id number]   . Eye surgery 01/2005  . Cardiac catheterization 07/03/2002    EF 60-70% /  Normal left ventricular function. / Very mild trivial three-vessel coronary atherosclerosis. / There is no mitral regurgitation noted    MEDICATIONS AT HOME: Prior to Admission medications   Medication Sig Start Date End Date Taking? Authorizing Provider  acetaminophen (TYLENOL) 500 MG tablet Take 500-1,000 mg by mouth every 6 (six) hours as needed. For pain   Yes Historical Provider, MD  Chlorhexidine Gluconate Cloth 2 % PADS Apply 6 each  topically daily. To cleanse arms, legs, abdomen and back (except incision site) for 4 days starting yesterday 08/27/11   Yes Historical Provider, MD  dorzolamide-timolol (COSOPT) 22.3-6.8 MG/ML ophthalmic solution Place 1 drop into both eyes daily.    Yes Historical Provider, MD  finasteride (PROSCAR) 5 MG tablet Take 5 mg by mouth daily.    Yes Historical Provider, MD  fludrocortisone (FLORINEF) 0.1 MG tablet Take 0.1 mg by mouth daily.    Yes Historical Provider, MD  levothyroxine (SYNTHROID, LEVOTHROID) 175 MCG tablet Take 175 mcg by mouth daily.    Yes Historical Provider, MD  midodrine (PROAMATINE) 10 MG tablet Take 5 mg by mouth 3 (three) times daily. Take 1/2 tablet by mouth at 8am, 12pm, and 4pm 08/13/11  Yes Duke Salvia, MD  mupirocin ointment (BACTROBAN) 2 % Apply 1 application topically 2 (two) times daily. Apply to both nostrils for 4 days - starting yesterday 08/27/11   Yes Historical Provider, MD  pyridostigmine (MESTINON) 60 MG tablet Take 60 mg by mouth 2 (two) times daily. Take one tablet every morning & one tablet at lunch. 04/06/11  Yes Duke Salvia, MD  silodosin (RAPAFLO) 8 MG CAPS capsule Take 8 mg by mouth daily with lunch.   Yes Historical Provider, MD  warfarin (COUMADIN) 5 MG tablet Take 2.5 mg by mouth daily. Start back on Sunday 1/20 - 1/2 tablet daily - contact coumadin clinic for blood level check in 1-2 weeks   Yes Historical Provider, MD  HYDROcodone-acetaminophen (NORCO) 5-325 MG per tablet Take 1-2 tablets by mouth every 6 (six) hours as needed. Not yet picked up 08/27/11 09/06/11  Kandis Cocking, MD    FAMILY HISTORY: Family History  Problem Relation Age of Onset  . Cancer      SOCIAL HISTORY:  reports that he quit smoking about 46 years ago. He has never used smokeless tobacco. He reports that he does not drink alcohol or use illicit drugs.  REVIEW OF SYSTEMS:  Constitutional:   No  weight loss, night sweats,  Fevers, chills, fatigue.  HEENT:    No  headaches, Difficulty swallowing,Tooth/dental problems,Sore throat,  No sneezing, itching, ear ache, nasal congestion, post nasal drip,   Cardio-vascular: No chest pain,  swelling in lower extremities, anasarca,  dizziness, palpitations  GI:  indigestion, abdominal pain, nausea, vomiting, diarrhea, change in  bowel habits, loss of appetite  Resp:   No excess mucus, no productive cough, No non-productive cough,  No coughing up of blood.No change in color of mucus.No wheezing.No chest wall deformity  Skin:  no rash or lesions.  GU:  no dysuria, change in color of urine, no urgency or frequency.  No flank pain.  Musculoskeletal: No joint pain or swelling.  No decreased range of motion.  No back pain.  Psych: No change in mood or affect. No depression or anxiety.  No memory loss.   PHYSICAL EXAM: Blood pressure 119/75, pulse  88, temperature 97.7 F (36.5 C), temperature source Oral, resp. rate 20, SpO2 98.00%.  General appearance :Awake, alert, not in any distress. Speech Clear. Not toxic Looking HEENT: Atraumatic and Normocephalic, pupils equally reactive to light and accomodation Neck: supple, no JVD. No cervical lymphadenopathy.  Chest:Good air entry bilaterally, prolonged expiratory phase with rhonchi. CVS: S1 S2 regular, no murmurs.  Abdomen: Bowel sounds present, Non tender and not distended with no gaurding, rigidity or rebound. Extremities: B/L Lower Ext shows no edema, both legs are warm to touch, with  dorsalis pedis pulses palpable. Neurology: Awake alert, and oriented X 3, CN II-XII intact, Non focal, Deep Tendon Reflex-2+ all over, plantar's downgoing B/L, sensory exam is grossly intact.  Skin:No Rash Wounds:N/A  LABS ON ADMISSION:   Basename 08/28/11 1205 08/26/11 1236 08/25/11 1540  NA 136 -- 138  K 4.2 -- 4.3  CL 100 -- 101  CO2 28 -- 31  GLUCOSE 121* -- 105*  BUN 17 -- 20  CREATININE 0.79 0.87 --  CALCIUM 9.8 -- 9.7  MG -- -- --  PHOS -- -- --   No  results found for this basename: AST:2,ALT:2,ALKPHOS:2,BILITOT:2,PROT:2,ALBUMIN:2 in the last 72 hours No results found for this basename: LIPASE:2,AMYLASE:2 in the last 72 hours  Basename 08/28/11 1205 08/25/11 1540  WBC 10.2 5.8  NEUTROABS 7.9* --  HGB 13.5 14.1  HCT 40.6 42.2  MCV 97.1 95.9  PLT 131* 146*    Basename 08/28/11 1211  CKTOTAL 41  CKMB 3.0  CKMBINDEX --  TROPONINI <0.30   No results found for this basename: DDIMER:2 in the last 72 hours No components found with this basename: POCBNP:3   RADIOLOGIC STUDIES ON ADMISSION: Dg Chest Portable 1 View  08/28/2011  *RADIOLOGY REPORT*  Clinical Data: Shortness of breath and wheezing.  PORTABLE CHEST - 1 VIEW  Comparison: PA and lateral chest 04/14/2011.  Findings: There is cardiomegaly.  Bilateral interstitial opacities most prominent bases are likely due to pulmonary edema.  No pneumothorax is identified.  IMPRESSION: Cardiomegaly and interstitial opacities most consistent with pulmonary edema.  Original Report Authenticated By: Bernadene Bell. D'ALESSIO, M.D.    ASSESSMENT AND PLAN: Present on Admission:  .SOB (shortness of breath) -Likely this is secondary to new onset of CHF decompensation given x-ray findings and elevated BNP. -However review of his prior  pulmonary notes does indicate that he does have known mild obstruction from prior PFTs, this could very well have some element of bronchospasm given wheezing.  -Patient has already received 40 mg of intravenous Lasix here in the emergency room and has received nebulized bronchodilators with some symptomatic relief.  -For now I will place him on nebulized bronchodilators, we will need to be careful with his Lasix dosing as he does have a known history of dysautonomia with orthostatic hypotension.  -I. will repeat a chest x-ray in the morning tomorrow, if it shows significant improvement, then likely this was all CHF -Avelox will be continued in the meantime as well, however  if the chest x-ray shows significant clearing tomorrow, this can also be discontinued. -Check 2-D echo  .Chest pain -Per patient's description, this sounds mostly like GERD, we'll start him on Protonix. -However I will cycle cardiac enzymes as well.   .Atrial fibrillation -this is rate controlled without the use of any rate control medications, per surgical discharge note, Coumadin to be resumed from tomorrow.  .Hypotension-orthostatic -We'll continue with Florinef, Midodrine and Mestinon   . inguinal hernia repair  -Incision looks to  be free of any discharge. -Followup with [facility] as an outpatient.  Further plan will depend as patient's clinical course evolves and further radiologic and laboratory data become available. Patient will be monitored closely.   DVT Prophylaxis: Prophylactic Lovenox, this can be discontinued when INR is more than 2  Code Status: Full code  Total time spent for admission equals 45 minutes.  Jeoffrey Massed 08/28/2011, 3:14 PM

## 2011-08-28 NOTE — ED Notes (Signed)
Dr. Manus Gunning was informed of pt O2 and heart rate while walking

## 2011-08-28 NOTE — ED Notes (Signed)
sts had hernia surgery and released yesterday and now has congestion and wheezing. Not feeling well. sts cough, phlegm per visitor "thick and orange"

## 2011-08-28 NOTE — ED Notes (Signed)
Pt knows that urine is needed 

## 2011-08-28 NOTE — Progress Notes (Signed)
ANTICOAGULATION CONSULT NOTE - Initial Consult  Pharmacy Consult for warfarin  Indication: atrial fibrillation  No Known Allergies  Vital Signs: Temp: 97.7 F (36.5 C) (01/19 1133) Temp src: Oral (01/19 1133) BP: 119/75 mmHg (01/19 1450) Pulse Rate: 88  (01/19 1423)  Labs:  Basename 08/28/11 1211 08/28/11 1205 08/26/11 1236  HGB -- 13.5 --  HCT -- 40.6 --  PLT -- 131* --  APTT -- -- --  LABPROT -- -- --  INR -- -- --  HEPARINUNFRC -- -- --  CREATININE -- 0.79 0.87  CKTOTAL 41 -- --  CKMB 3.0 -- --  TROPONINI <0.30 -- --   The CrCl is unknown because both a height and weight (above a minimum accepted value) are required for this calculation.  Medical History: Past Medical History  Diagnosis Date  . Atrial fibrillation   . Long-term (current) use of anticoagulants   . Glaucoma   . Gout   . Hyperthyroidism   . Orthostatic hypotension   . Redundant prepuce and phimosis   . Retention of urine, unspecified   . Acute cystitis   . Urgency of urination   . Hypertonicity of bladder   . Bradycardia        . HTN (hypertension)   . Atelectasis   . Dysautonomia   . Pacemaker     St. Jude; device generator replacement 2012  . Asthmatic bronchitis   . CHF (congestive heart failure)   . Sleep apnea     does not use cpap not in 4 yrs  . Chronic kidney disease [facility]     UTI  1 MO AGO [place] MEDICALLY     Medications:  Prescriptions prior to admission  Medication Sig Dispense Refill  . acetaminophen (TYLENOL) 500 MG tablet Take 500-1,000 mg by mouth every 6 (six) hours as needed. For pain      . Chlorhexidine Gluconate Cloth 2 % PADS Apply 6 each topically daily. To cleanse arms, legs, abdomen and back (except incision site) for 4 days starting yesterday 08/27/11      . dorzolamide-timolol (COSOPT) 22.3-6.8 MG/ML ophthalmic solution Place 1 drop into both eyes daily.       . finasteride (PROSCAR) 5 MG tablet Take 5 mg by mouth daily.       . fludrocortisone  (FLORINEF) 0.1 MG tablet Take 0.1 mg by mouth daily.       [name] levothyroxine (SYNTHROID, LEVOTHROID) 175 MCG tablet Take 175 mcg by mouth daily.       . midodrine (PROAMATINE) 10 MG tablet Take 5 mg by mouth 3 (three) times daily. Take 1/2 tablet by mouth at 8am, 12pm, and 4pm      . mupirocin ointment (BACTROBAN) 2 % Apply 1 application topically 2 (two) times daily. Apply to both nostrils for 4 days - starting yesterday 08/27/11      . pyridostigmine (MESTINON) 60 MG tablet Take 60 mg by mouth 2 (two) times daily. Take one tablet every morning & one tablet at lunch.      . silodosin (RAPAFLO) 8 MG CAPS capsule Take 8 mg by mouth daily with lunch.      . warfarin (COUMADIN) 5 MG tablet Take 2.5 mg by mouth daily. Start back on Sunday 1/20 - 1/2 tablet daily - contact coumadin clinic for blood level check in 1-2 weeks      . HYDROcodone-acetaminophen (NORCO) 5-325 MG per tablet Take 1-2 tablets by mouth every 6 (six) hours as needed. Not yet picked  up        Assessment: 60 yom admitted with heartburn and shortness of breath since this morning. He was discharged yesterday from the hospital s/p inguinal hernia repair. Per post-op plan, coumadin was supposed to be resumed tomorrow.   Goal of Therapy:  INR 2-3   Plan:  1. Check AM INR on 1/20 and dose coumadin from there 2. Daily INR  Glenna Brunkow, Drake Leach 08/28/2011,4:53 PM

## 2011-08-28 NOTE — ED Provider Notes (Signed)
History     CSN: [id number]  Arrival date & time 08/28/11  1125   First MD Initiated Contact with Patient 08/28/11 1141      Chief Complaint  Patient presents with  . Shortness of Breath    (Consider location/radiation/quality/duration/timing/severity/associated sxs/prior treatment) HPI Comments: 78 year old male with history of atrial fibrillation, tachybradycardia syndrome status post pacemaker recent inguinal hernia repair presenting with shortness of breath that developed this morning. He was discharged yesterday after having hernia repair done by Dr. Ezzard Standing on the 17th. This morning he woke up with a moist productive cough, tachypnea, difficulty breathing. No fevers, chest pain, abdominal pain nausea or vomiting. No pain or swelling in his legs. Breathing is better with sitting up and worse with lying down.  The history is provided by the patient.    Past Medical History  Diagnosis Date  . Atrial fibrillation   . Long-term (current) use of anticoagulants   . Glaucoma   . Gout   . Hyperthyroidism   . Orthostatic hypotension   . Redundant prepuce and phimosis   . Retention of urine, unspecified   . Acute cystitis   . Urgency of urination   . Hypertonicity of bladder   . Bradycardia        . HTN (hypertension)   . Atelectasis   . Dysautonomia   . Pacemaker     St. Jude; device generator replacement 2012  . Asthmatic bronchitis   . CHF (congestive heart failure)   . Sleep apnea     does not use cpap not in 4 yrs  . Chronic kidney disease [facility]     UTI  1 MO AGO [place] MEDICALLY     Past Surgical History  Procedure Date  . Appendectomy   . Hernia repair   . Tonsillectomy   . Cardioversion 12/19/2003    cardioversion and pacemaker interrogation  . Elbow bursa surgery 04/07/2006    Right elbow septic olecranon bursitis  . Toe amputation     Osteomyelitis, right foot second toe /  Right foot second ray amputation  . Pacemaker insertion 07/04/2002  .  Insert / replace / remove pacemaker 07/04/2002 dr Graciela Husbands     dual chamber pacemaker - St. Jude model 1688 TC 58 cm active fixation lead was placed into the right ventricle and the St Jude model 1688 TC 52 cm active fixation pacing lead was placed in the right atrium.  The ventricular lead serial number was [id number] and the atrial lead serial number was [id number]  Of note, the pacemaker generator was an identity [id number] serial number [id number]   . Eye surgery 01/2005  . Cardiac catheterization 07/03/2002    EF 60-70% /  Normal left ventricular function. / Very mild trivial three-vessel coronary atherosclerosis. / There is no mitral regurgitation noted    Family History  Problem Relation Age of Onset  . Cancer      History  Substance Use Topics  . Smoking status: Former Smoker    Quit date: 08/09/1965  . Smokeless tobacco: Never Used  . Alcohol Use: No      Review of Systems  Constitutional: Positive for activity change and appetite change. Negative for fever and fatigue.  HENT: Positive for congestion and rhinorrhea.   Respiratory: Positive for cough, chest tightness and shortness of breath.   Cardiovascular: Negative for chest pain.  Gastrointestinal: Negative for nausea, vomiting and abdominal pain.  Genitourinary: Negative for dysuria and hematuria.  Musculoskeletal: Negative for back pain.  Skin: Negative for rash.  Neurological: Negative for headaches.    Allergies  Review of patient's allergies indicates no known allergies.  Home Medications   No current outpatient prescriptions on file.  BP 126/74  Pulse 78  Temp(Src) 98 F (36.7 C) (Oral)  Resp 20  Ht 6\' 3"  (1.905 m)  Wt 210 lb (95.255 kg)  BMI 26.25 kg/m2  SpO2 97%  Physical Exam  Constitutional: He is oriented to person, place, and time. He appears well-developed and well-nourished. No distress.       Upper respiratory congestion  HENT:  Head: Normocephalic and atraumatic.  Mouth/Throat: Oropharynx is clear  and moist. No oropharyngeal exudate.  Eyes: Conjunctivae are normal. Pupils are equal, round, and reactive to light.  Neck: Normal range of motion. Neck supple.  Cardiovascular: Normal rate and normal heart sounds.   Pulmonary/Chest: Effort normal. He has wheezes.       Crackles throughout with scattered wheezes  Abdominal: Soft. There is no tenderness. There is no rebound and no guarding.       Left inguinal area with healing surgical incision, mild surrounding erythema  Musculoskeletal: Normal range of motion. He exhibits no edema and no tenderness.       No leg pain or swelling  Neurological: He is alert and oriented to person, place, and time. No cranial nerve deficit.  Skin: Skin is warm.    ED Course  Procedures (including critical care time)  Labs Reviewed  CBC - Abnormal; Notable for the following:    RBC 4.18 (*)    Platelets 131 (*)    All other components within normal limits  DIFFERENTIAL - Abnormal; Notable for the following:    Neutrophils Relative 78 (*)    Neutro Abs 7.9 (*)    Lymphocytes Relative 10 (*)    Monocytes Absolute 1.2 (*)    All other components within normal limits  BASIC METABOLIC PANEL - Abnormal; Notable for the following:    Glucose, Bld 121 (*)    GFR calc non Af Amer 78 (*)    All other components within normal limits  URINALYSIS, ROUTINE W REFLEX MICROSCOPIC - Abnormal; Notable for the following:    APPearance CLOUDY (*)    Hgb urine dipstick SMALL (*)    Leukocytes, UA MODERATE (*)    All other components within normal limits  PRO B NATRIURETIC PEPTIDE - Abnormal; Notable for the following:    Pro B Natriuretic peptide (BNP) 1283.0 (*)    All other components within normal limits  CBC - Abnormal; Notable for the following:    Platelets 148 (*)    All other components within normal limits  CARDIAC PANEL(CRET KIN+CKTOT+MB+TROPI)  URINE MICROSCOPIC-ADD ON  CARDIAC PANEL(CRET KIN+CKTOT+MB+TROPI)  CREATININE, SERUM  BASIC METABOLIC PANEL   CBC  PROTIME-INR   Dg Chest Portable 1 View  08/28/2011  *RADIOLOGY REPORT*  Clinical Data: Shortness of breath and wheezing.  PORTABLE CHEST - 1 VIEW  Comparison: PA and lateral chest 04/14/2011.  Findings: There is cardiomegaly.  Bilateral interstitial opacities most prominent bases are likely due to pulmonary edema.  No pneumothorax is identified.  IMPRESSION: Cardiomegaly and interstitial opacities most consistent with pulmonary edema.  Original Report Authenticated By: Bernadene Bell. D'ALESSIO, M.D.     1. Dyspnea   2. Pulmonary edema   3. Atrial fibrillation   4. Cardiac pacemaker in situ   5. Sinoatrial node dysfunction   6. PACEMAKER, PERMANENT   7. Hypotension-orthostatic   8. DYSAUTONOMIA  MDM  Congestion, shortness of breath, cough after hernia repair surgery. Consider pneumonia, PE, pulmonary edema  Bilateral infiltrates on XRay.  Cough, congestion.  No JVD, LE edema. CHF with likely COPD component and possible pneumonia.  Abx, lasix, admit   Date: 08/28/2011  Rate: 91  Rhythm: paced  QRS Axis: left  Intervals: QT prolonged  ST/T Wave abnormalities: nonspecific ST/T changes  Conduction Disutrbances:right bundle branch block and left anterior fascicular block  Narrative Interpretation:   Old EKG Reviewed: none available  CRITICAL CARE Performed by: Glynn Octave   Total critical care time: 30  Critical care time was exclusive of separately billable procedures and treating other patients.  Critical care was necessary to treat or prevent imminent or life-threatening deterioration.  Critical care was time spent personally by me on the following activities: development of treatment plan with patient and/or surrogate as well as nursing, discussions with consultants, evaluation of patient's response to treatment, examination of patient, obtaining history from patient or surrogate, ordering and performing treatments and interventions, ordering and review of  laboratory studies, ordering and review of radiographic studies, pulse oximetry and re-evaluation of patient's condition.   Glynn Octave, MD 08/28/11 [phone]

## 2011-08-28 NOTE — ED Notes (Signed)
Pt O2 walking dropped down to 82% and heart rate was 126 walking to the Dr. [name] and back to the room. Pt in now back in the bed and O2 is 99% on room air heart rate is back to 75

## 2011-08-28 NOTE — Telephone Encounter (Signed)
He underwent an inguinal hernia repair by Dr. Ezzard Standing 2 days ago. He woke up this morning at 4 AM with shortness of breath and wheezing which is progressively worsening. I told him he needed to go to the emergency department and have this checked out right away. I talked with his daughter about this as well.

## 2011-08-28 NOTE — ED Notes (Signed)
Pt reports he woke up around 3am with acid reflux, described burning in chest. Reports he felt really hot and even had to go stand outside to cool off. Pt states he started having sob and wheezing this am. Pt had inguinal hernia surgery and was discharged on the 17th. Pt denies chest pain. Pt is warm and dry. States he feels like there is phlegm in his throat.

## 2011-08-28 NOTE — Progress Notes (Signed)
08/28/11 1600 Nursing Note: Pt arrived to unit stable with no acute issues. Pt reported a pain level of 0 and no other complaints. Pt oriented to room given urinal, call bell with in reach. Pt on cardiac monitor. Vital signs stable. Will continue to monitor pt. Gianne Shugars Scientist, clinical (histocompatibility and immunogenetics).

## 2011-08-29 ENCOUNTER — Encounter (HOSPITAL_COMMUNITY): Payer: Self-pay | Admitting: Cardiology

## 2011-08-29 ENCOUNTER — Inpatient Hospital Stay (HOSPITAL_COMMUNITY): Payer: Medicare Other

## 2011-08-29 DIAGNOSIS — R0989 Other specified symptoms and signs involving the circulatory and respiratory systems: Secondary | ICD-10-CM

## 2011-08-29 DIAGNOSIS — I359 Nonrheumatic aortic valve disorder, unspecified: Secondary | ICD-10-CM

## 2011-08-29 DIAGNOSIS — I4891 Unspecified atrial fibrillation: Secondary | ICD-10-CM

## 2011-08-29 LAB — BASIC METABOLIC PANEL
BUN: 18 mg/dL (ref 6–23)
CO2: 30 mEq/L (ref 19–32)
Chloride: 98 mEq/L (ref 96–112)
Creatinine, Ser: 0.9 mg/dL (ref 0.50–1.35)
GFR calc Af Amer: 86 mL/min — ABNORMAL LOW (ref >90)
GFR calc non Af Amer: 74 mL/min — ABNORMAL LOW (ref >90)
Potassium: 4.3 mEq/L (ref 3.5–5.1)
Sodium: 136 mEq/L (ref 135–145)

## 2011-08-29 LAB — PROTIME-INR
INR: 1.17 (ref 0.00–1.49)
Prothrombin Time: 15.1 seconds (ref 11.6–15.2)

## 2011-08-29 LAB — CBC
MCHC: 32.2 g/dL (ref 30.0–36.0)
RDW: 12.8 % (ref 11.5–15.5)

## 2011-08-29 LAB — CARDIAC PANEL(CRET KIN+CKTOT+MB+TROPI)
CK, MB: 2.5 ng/mL (ref 0.3–4.0)
Relative Index: INVALID (ref 0.0–2.5)
Relative Index: INVALID (ref 0.0–2.5)
Troponin I: <0.3 ng/mL (ref <0.30)

## 2011-08-29 MED ORDER — WARFARIN SODIUM 5 MG PO TABS
5.0000 mg | ORAL_TABLET | Freq: Once | ORAL | Status: AC
  Administered 2011-08-29: 5 mg via ORAL
  Filled 2011-08-29: qty 1

## 2011-08-29 NOTE — Progress Notes (Signed)
ANTICOAGULATION CONSULT NOTE - Initial Consult  Pharmacy Consult for warfarin  Indication: atrial fibrillation  No Known Allergies  Vital Signs: Temp: 97.3 F (36.3 C) (01/20 0547) Temp src: Oral (01/20 0547) BP: 147/77 mmHg (01/20 0547) Pulse Rate: 64  (01/20 0547)  Labs:  Basename 08/28/11 2351 08/28/11 1700 08/28/11 1211 08/28/11 1205  HGB 13.4 13.7 -- --  HCT 41.6 41.9 -- 40.6  PLT 153 148* -- 131*  APTT -- -- -- --  LABPROT 15.1 -- -- --  INR 1.17 -- -- --  HEPARINUNFRC -- -- -- --  CREATININE 0.90 0.85 -- 0.79  CKTOTAL 37 -- 41 --  CKMB 2.5 -- 3.0 --  TROPONINI <0.30 -- <0.30 --   Estimated Creatinine Clearance: 67.8 ml/min (by C-G formula based on Cr of 0.9).  Medical History: Past Medical History  Diagnosis Date  . Atrial fibrillation   . Long-term (current) use of anticoagulants   . Glaucoma   . Gout   . Hyperthyroidism   . Orthostatic hypotension   . Redundant prepuce and phimosis   . Retention of urine, unspecified   . Acute cystitis   . Urgency of urination   . Hypertonicity of bladder   . Bradycardia        . HTN (hypertension)   . Atelectasis   . Dysautonomia   . Pacemaker     St. Jude; device generator replacement 2012  . Asthmatic bronchitis   . CHF (congestive heart failure)   . Sleep apnea     does not use cpap not in 4 yrs  . Chronic kidney disease [facility]     UTI  1 MO AGO [place] MEDICALLY     Medications:  Prescriptions prior to admission  Medication Sig Dispense Refill  . acetaminophen (TYLENOL) 500 MG tablet Take 500-1,000 mg by mouth every 6 (six) hours as needed. For pain      . Chlorhexidine Gluconate Cloth 2 % PADS Apply 6 each topically daily. To cleanse arms, legs, abdomen and back (except incision site) for 4 days starting yesterday 08/27/11      . dorzolamide-timolol (COSOPT) 22.3-6.8 MG/ML ophthalmic solution Place 1 drop into both eyes daily.       . finasteride (PROSCAR) 5 MG tablet Take 5 mg by mouth daily.         . fludrocortisone (FLORINEF) 0.1 MG tablet Take 0.1 mg by mouth daily.       [name] levothyroxine (SYNTHROID, LEVOTHROID) 175 MCG tablet Take 175 mcg by mouth daily.       . midodrine (PROAMATINE) 10 MG tablet Take 5 mg by mouth 3 (three) times daily. Take 1/2 tablet by mouth at 8am, 12pm, and 4pm      . mupirocin ointment (BACTROBAN) 2 % Apply 1 application topically 2 (two) times daily. Apply to both nostrils for 4 days - starting yesterday 08/27/11      . pyridostigmine (MESTINON) 60 MG tablet Take 60 mg by mouth 2 (two) times daily. Take one tablet every morning & one tablet at lunch.      . silodosin (RAPAFLO) 8 MG CAPS capsule Take 8 mg by mouth daily with lunch.      . warfarin (COUMADIN) 5 MG tablet Take 2.5 mg by mouth daily. Start back on Sunday 1/20 - 1/2 tablet daily - contact coumadin clinic for blood level check in 1-2 weeks      . HYDROcodone-acetaminophen (NORCO) 5-325 MG per tablet Take 1-2 tablets by mouth every 6 (  six) hours as needed. Not yet picked up        Assessment: 23 yom admitted with heartburn and shortness of breath.  He was discharged yesterday from the hospital s/p inguinal hernia repair.  Now restarting coumadin, INR is subtherapeutic.  Home dose of coumadin is 2.5mg  daily.    Goal of Therapy:  INR 2-3   Plan:  1.Coumadin 5mg  today x 1 dose. 2. Continue lovenox 40mg  SQ qday til INR>2 3. Daily PT/INR   Marylouise Stacks 08/29/2011,8:41 AM

## 2011-08-29 NOTE — Progress Notes (Signed)
08/29/11 1730 Nursing Note: Pt ambulating in hallway and complained of feeling dizzy. Nurse encouraged pt to ambulate back to room to be put back in bed. Pt in bed vitals 88/52, oxygen level 82% on room air. Pt states" I just felt a little dizzy". Pt is now in bed with 2 liters of oxygen applied.  Pt educated not to get out of bed without assistance After pt rested for about five minutes blood pressure rechecked 156/76, pulse 82, oxygen 98% on 2 liters of oxygen. Pt stable, will continue to monitor pt. Joyia Riehle Scientist, clinical (histocompatibility and immunogenetics).

## 2011-08-29 NOTE — Progress Notes (Signed)
PATIENT DETAILS Name: Marco Gregory Age: 78 y.o. Sex: male Date of Birth: 1921/07/23 Admit Date: 08/28/2011 WUJ:[id number] Brackbill, MD, MD  Subjective: Better, but not yet back to baseline  Objective: Vital signs in last 24 hours: Filed Vitals:   08/29/11 0817 08/29/11 1016 08/29/11 1429 08/29/11 1443  BP:  100/61  101/64  Pulse:  85  86  Temp:    97.7 F (36.5 C)  TempSrc:    Oral  Resp:    18  Height:      Weight:      SpO2: 98%  100% 100%    Weight change:   Body mass index is 26.12 kg/(m^2).  Intake/Output from previous day:  Intake/Output Summary (Last 24 hours) at 08/29/11 1619 Last data filed at 08/29/11 1322  Gross per 24 hour  Intake    827 ml  Output   2326 ml  Net  -1499 ml    PHYSICAL EXAM: Gen Exam: Awake and alert with clear speech.   Neck: Supple, No JVD.   Chest: B/L Clear.   CVS: S1 S2 Regular, no murmurs.  Abdomen: soft, BS +, non tender, non distended.  Extremities: no edema, lower extremities warm to touch. Neurologic: Non Focal.   Skin: No Rash.  Wounds: N/A.    CONSULTS:  cardiology  LAB RESULTS: CBC  Lab 08/28/11 2351 08/28/11 1700 08/28/11 1205 08/25/11 1540  WBC 8.2 8.0 10.2 5.8  HGB 13.4 13.7 13.5 14.1  HCT 41.6 41.9 40.6 42.2  PLT 153 148* 131* 146*  MCV 97.2 97.0 97.1 95.9  MCH 31.3 31.7 32.3 32.0  MCHC 32.2 32.7 33.3 33.4  RDW 12.8 12.7 12.7 12.8  LYMPHSABS -- -- 1.0 --  MONOABS -- -- 1.2* --  EOSABS -- -- 0.0 --  BASOSABS -- -- 0.0 --  BANDABS -- -- -- --    Chemistries   Lab 08/28/11 2351 08/28/11 1700 08/28/11 1205 08/26/11 1236 08/25/11 1540  NA 136 -- 136 -- 138  K 4.3 -- 4.2 -- 4.3  CL 98 -- 100 -- 101  CO2 30 -- 28 -- 31  GLUCOSE 112* -- 121* -- 105*  BUN 18 -- 17 -- 20  CREATININE 0.90 0.85 0.79 0.87 0.95  CALCIUM 9.6 -- 9.8 -- 9.7  MG -- -- -- -- --    GFR Estimated Creatinine Clearance: 67.8 ml/min (by C-G formula based on Cr of 0.9).  Coagulation profile  Lab 08/28/11 2351 08/25/11  1540 08/25/11 1044  INR 1.17 1.10 1.2  PROTIME -- -- --    Cardiac Enzymes  Lab 08/29/11 0815 08/28/11 2351 08/28/11 1211  CKMB 2.6 2.5 3.0  TROPONINI <0.30 <0.30 <0.30  MYOGLOBIN -- -- --    No components found with this basename: POCBNP:3 No results found for this basename: DDIMER:2 in the last 72 hours No results found for this basename: HGBA1C:2 in the last 72 hours No results found for this basename: CHOL:2,HDL:2,LDLCALC:2,TRIG:2,CHOLHDL:2,LDLDIRECT:2 in the last 72 hours No results found for this basename: TSH,T4TOTAL,FREET3,T3FREE,THYROIDAB in the last 72 hours No results found for this basename: VITAMINB12:2,FOLATE:2,FERRITIN:2,TIBC:2,IRON:2,RETICCTPCT:2 in the last 72 hours No results found for this basename: LIPASE:2,AMYLASE:2 in the last 72 hours  Urine Studies No results found for this basename: UACOL:2,UAPR:2,USPG:2,UPH:2,UTP:2,UGL:2,UKET:2,UBIL:2,UHGB:2,UNIT:2,UROB:2,ULEU:2,UEPI:2,UWBC:2,URBC:2,UBAC:2,CAST:2,CRYS:2,UCOM:2,BILUA:2 in the last 72 hours  MICROBIOLOGY: Recent Results (from the past 240 hour(s))  SURGICAL PCR SCREEN     Status: Abnormal   Collection Time   08/25/11  3:38 PM      Component Value Range Status Comment  MRSA, PCR POSITIVE (*) NEGATIVE  Final    Staphylococcus aureus POSITIVE (*) NEGATIVE  Final     RADIOLOGY STUDIES/RESULTS: Dg Chest Port 1 View  08/29/2011  *RADIOLOGY REPORT*  Clinical Data: Shortness of breath  PORTABLE CHEST - 1 VIEW  Comparison: 08/28/2011  Findings: Dual lead left-sided pacer noted.  There has been minimal interval improvement in probable interstitial edema since previously.  Minimally improved aeration is noted with persistent bibasilar atelectasis.  The hila remain prominent bilaterally, likely reflecting central venous congestion.  IMPRESSION: Minimally improved aeration and interstitial edema.  Original Report Authenticated By: Harrel Lemon, M.D.   Dg Chest Portable 1 View  08/28/2011  *RADIOLOGY REPORT*   Clinical Data: Shortness of breath and wheezing.  PORTABLE CHEST - 1 VIEW  Comparison: PA and lateral chest 04/14/2011.  Findings: There is cardiomegaly.  Bilateral interstitial opacities most prominent bases are likely due to pulmonary edema.  No pneumothorax is identified.  IMPRESSION: Cardiomegaly and interstitial opacities most consistent with pulmonary edema.  Original Report Authenticated By: Bernadene Bell. D'ALESSIO, M.D.    MEDICATIONS: Scheduled Meds:   . Chlorhexidine Gluconate Cloth  6 each Topical Daily  . dorzolamide  1 drop Both Eyes TID  . enoxaparin  40 mg Subcutaneous Q24H  . finasteride  5 mg Oral Daily  . fludrocortisone  0.1 mg Oral Daily  . furosemide  20 mg Intravenous Daily  . ipratropium  0.5 mg Nebulization Q6H  . levalbuterol  0.63 mg Nebulization Q6H  . levothyroxine  175 mcg Oral QAC breakfast  . midodrine  5 mg Oral TID WC  . moxifloxacin  400 mg Intravenous Q24H  . mupirocin ointment  1 application Topical BID  . pantoprazole  40 mg Oral Q1200  . pyridostigmine  60 mg Oral BID  . silodosin  8 mg Oral Q lunch  . timolol  1 drop Both Eyes Daily  . warfarin  5 mg Oral ONCE-1800  . DISCONTD: dorzolamide-timolol  1 drop Both Eyes Daily  . DISCONTD: sodium chloride  3 mL Intravenous Q12H   Continuous Infusions:  PRN Meds:.acetaminophen, alum & mag hydroxide-simeth, HYDROcodone-acetaminophen, ondansetron (ZOFRAN) IV, DISCONTD: sodium chloride, DISCONTD: sodium chloride  Antibiotics: Anti-infectives     Start     Dose/Rate Route Frequency Ordered Stop   08/28/11 1800   moxifloxacin (AVELOX) IVPB 400 mg        400 mg 250 mL/hr over 60 Minutes Intravenous Every 24 hours 08/28/11 1646     08/28/11 1245   moxifloxacin (AVELOX) IVPB 400 mg        400 mg 250 mL/hr over 60 Minutes Intravenous  Once 08/28/11 1234 08/28/11 1358          Assessment/Plan: Patient Active Hospital Problem List:  Shortness of breath Likely secondary to CHF decompensation-?Diastolic  dysfunction Better today with lasix Will need to be carefull with lasix and anti-hypertensives as has long standing h/o orthostatic hypotension  Hypotension-orthostatic  Stable, tolerating lasix well Continue with midodrine, florinef and mestinon  Atrial fibrillation  Rate controlled Coumadin resumed today    ASTHMATIC BRONCHITIS,  Better, less wheezing today Continue with avelox and bronchodilators  Chest pain -cardiac enzymes negtive -likely GI origin-continue with PPI  Status post inguinal hernia repair Outpatient follow up with [facility]  Disposition: Remain inpatient  DVT Prophylaxis: Prophylactic Lovenox till INR >2  Code Status: Full Code  Maretta Bees,  MD. 08/29/2011, 4:19 PM

## 2011-08-29 NOTE — Consult Note (Signed)
CARDIOLOGY CONSULT NOTE  Patient ID: BLAZE NYLUND MRN: [id number] DOB/AGE: Feb 15, 1921 78 y.o.  Admit date: 08/28/2011 Primary Cardiologist Dr. Patty Sermons Chief Complaint  Dyspnea  HPI:   The patient was discharged on 08/27/11 after an inguinal hernia repair.  He has a cardiac history as listed below.  On admission he was noted to have some edema on CXR and the proBNP was elevated.  He has been treated for CHF as well as possible acute exacerbation of chronic lung disease.  He has been treated with Lasix and bronchodilators.  He is breathing better but not quite at baseline.  The patient has a history apparently of diastolic dysfunction but does not report frequent exacerbations. In fact he has had more problems with orthostasis and has been on fludrocortisone and high salt intake with improvement in this. He does have a chronic cough productive of an occasional thick sputum. He doesn't report any official diagnosis of lung disease however. He doesn't describe shortness of breath typically. He doesn't describe PND or orthopnea. He is somewhat limited by spinal stenosis. He can get up stairs slowly. He can walk sometimes with a cane or walker and sometimes unassisted. Walking through the grocery store he denies any chest pressure, neck or arm discomfort. He doesn't notice any palpitations, presyncope or syncope.  He had inguinal hernia repair on the left side. He was discharged the next day and actually did well. He woke up acutely with heartburn at 3 in the morning. He then developed wheezing and shortness of breath. He was instructed to come to the emergency room which she did by private car.  History coughing with sputium  chf has been compensated    Takes high salt by instruction   Weights stable   Limited by spinal stenosis      Walker cane  Grocery   Stairs at hime    No sob    No pnd      Past Medical History  Diagnosis Date  . Atrial fibrillation   . Long-term (current) use of  anticoagulants   . Glaucoma   . Gout   . Hyperthyroidism   . Orthostatic hypotension   . Redundant prepuce and phimosis   . Acute cystitis   . Hypertonicity of bladder   . Bradycardia        . HTN (hypertension)   . Atelectasis   . Dysautonomia   . Pacemaker     St. Jude; device generator replacement 2012  . Asthmatic bronchitis   . CHF (congestive heart failure)     Previous preserved EF.  [name] Sleep apnea     does not use cpap not in 4 yrs  . Chronic kidney disease [facility]     UTI  1 MO AGO TX MEDICALLY     Past Surgical History  Procedure Date  . Appendectomy   . Hernia repair   . Tonsillectomy   . Cardioversion 12/19/2003    cardioversion and pacemaker interrogation  . Elbow bursa surgery 04/07/2006    Right elbow septic olecranon bursitis  . Toe amputation     Osteomyelitis, right foot second toe /  Right foot second ray amputation  . Pacemaker insertion 07/04/2002  . Insert / replace / remove pacemaker 07/04/2002 dr Graciela Husbands     dual chamber pacemaker - St. Jude model 1688 TC 58 cm active fixation lead was placed into the right ventricle and the St Jude model 1688 TC 52 cm active fixation pacing lead was placed  in the right atrium.  The ventricular lead serial number was [id number] and the atrial lead serial number was [id number]  Of note, the pacemaker generator was an identity [id number] serial number [id number]   . Eye surgery 01/2005  . Cardiac catheterization 07/03/2002    EF 60-70% /  Normal left ventricular function. / Very mild trivial three-vessel coronary atherosclerosis. / There is no mitral regurgitation noted    No Known Allergies Prescriptions prior to admission  Medication Sig Dispense Refill  . acetaminophen (TYLENOL) 500 MG tablet Take 500-1,000 mg by mouth every 6 (six) hours as needed. For pain      . Chlorhexidine Gluconate Cloth 2 % PADS Apply 6 each topically daily. To cleanse arms, legs, abdomen and back (except incision site) for 4 days starting  yesterday 08/27/11      . dorzolamide-timolol (COSOPT) 22.3-6.8 MG/ML ophthalmic solution Place 1 drop into both eyes daily.       . finasteride (PROSCAR) 5 MG tablet Take 5 mg by mouth daily.       . fludrocortisone (FLORINEF) 0.1 MG tablet Take 0.1 mg by mouth daily.       [name] levothyroxine (SYNTHROID, LEVOTHROID) 175 MCG tablet Take 175 mcg by mouth daily.       . midodrine (PROAMATINE) 10 MG tablet Take 5 mg by mouth 3 (three) times daily. Take 1/2 tablet by mouth at 8am, 12pm, and 4pm      . mupirocin ointment (BACTROBAN) 2 % Apply 1 application topically 2 (two) times daily. Apply to both nostrils for 4 days - starting yesterday 08/27/11      . pyridostigmine (MESTINON) 60 MG tablet Take 60 mg by mouth 2 (two) times daily. Take one tablet every morning & one tablet at lunch.      . silodosin (RAPAFLO) 8 MG CAPS capsule Take 8 mg by mouth daily with lunch.      . warfarin (COUMADIN) 5 MG tablet Take 2.5 mg by mouth daily. Start back on Sunday 1/20 - 1/2 tablet daily - contact coumadin clinic for blood level check in 1-2 weeks      . HYDROcodone-acetaminophen (NORCO) 5-325 MG per tablet Take 1-2 tablets by mouth every 6 (six) hours as needed. Not yet picked up       Family History  Problem Relation Age of Onset  . Cancer      History   Social History  . Marital Status: Married    Spouse Name: N/A    Number of Children: N/A  . Years of Education: N/A   Occupational History  . Retired    Social History Main Topics  . Smoking status: Former Smoker    Quit date: 08/09/1965  . Smokeless tobacco: Never Used  . Alcohol Use: No  . Drug Use: No  . Sexually Active: Not Currently   Other Topics Concern  . Not on file   Social History Narrative  . No narrative on file     ROS:  Urinary frequency.  Otherwise as stated in the HPI and negative for all other systems.  Physical Exam: Blood pressure 100/61, pulse 85, temperature 97.3 F (36.3 C), temperature source Oral, resp. rate 20,  height 6\' 3"  (1.905 m), weight 94.8 kg (208 lb 15.9 oz), SpO2 98.00%.  GENERAL:  Well appearing HEENT:  Pupils equal round and reactive, fundi not visualized, oral mucosa unremarkable NECK:  No jugular venous distention, waveform within normal limits, carotid upstroke brisk and symmetric, no bruits, no thyromegaly LYMPHATICS:  No cervical, inguinal adenopathy LUNGS:  Few coarse crackles, no fine crackles. BACK:  No CVA tenderness CHEST:  Healed pacer pocket HEART:  PMI not displaced or sustained,S1 and S2 within normal limits, no S3, no clicks, no rubs, no murmurs, irregular ABD:  Flat, positive bowel sounds normal in frequency in pitch, no bruits, no rebound, no guarding, no midline pulsatile mass, no hepatomegaly, no splenomegaly, staples in his left inguinal hernia scar EXT:  2 plus pulses throughout, no edema, no cyanosis no clubbing SKIN:  No rashes no nodules NEURO:  Cranial nerves II through XII grossly intact, motor grossly intact throughout PSYCH:  Cognitively intact, oriented to person place and time  Labs: Lab Results  Component Value Date   BUN 18 08/28/2011   Lab Results  Component Value Date   CREATININE 0.90 08/28/2011   Lab Results  Component Value Date   NA 136 08/28/2011   K 4.3 08/28/2011   CL 98 08/28/2011   CO2 30 08/28/2011   Lab Results  Component Value Date   CKTOTAL 36 08/29/2011   CKMB 2.6 08/29/2011   TROPONINI <0.30 08/29/2011   Lab Results  Component Value Date   WBC 8.2 08/28/2011   HGB 13.4 08/28/2011   HCT 41.6 08/28/2011   MCV 97.2 08/28/2011   PLT 153 08/28/2011   No results found for this basename: CHOL,  HDL,  LDLCALC,  LDLDIRECT,  TRIG,  CHOLHDL   Lab Results  Component Value Date   ALT 16 01/01/2011   AST 20 01/01/2011   ALKPHOS 59 01/01/2011   BILITOT 0.6 01/01/2011      Radiology:   CXR:  Minimally improved aeration and interstitial edema.  EKG: Atrial fibrillation with demand ventricular pacing RBBB, LAFB. 08/28/11  ASSESSMENT AND  PLAN:   1)  Atrial fibrillation:  Rate OK.  Coumadin restarted.  2)  CHF:  Echo ordered.  I don't see a recent one. Previous diastolic dysfunction.  Some evidence of volume overload.   He had good urine output with the initial dose of diuretic.  It looks like he got 60 mg total yesterday and 20 mg this am.  I don't suspect that he will need much more than this or that he will need home diuretic.  He has not been on home diuretic and actually has been instructed to take I. salt in the past. He's been very troubled by orthostasis and I think this would be the bigger problem. I agree with IV Lasix low dose again tomorrow and then probably discontinue. We can follow him closely as an outpatient in our clinic to assess his volume after discharge. I do agree with continued management of a possible primary pulmonary component as well.  SignedRollene Rotunda 08/29/2011, 11:42 AM

## 2011-08-30 ENCOUNTER — Inpatient Hospital Stay (HOSPITAL_COMMUNITY): Payer: Medicare Other

## 2011-08-30 LAB — BASIC METABOLIC PANEL
Chloride: 100 mEq/L (ref 96–112)
Creatinine, Ser: 0.85 mg/dL (ref 0.50–1.35)
GFR calc Af Amer: 88 mL/min — ABNORMAL LOW (ref >90)
Potassium: 4 mEq/L (ref 3.5–5.1)

## 2011-08-30 LAB — PROTIME-INR: Prothrombin Time: 15.3 seconds — ABNORMAL HIGH (ref 11.6–15.2)

## 2011-08-30 MED ORDER — WARFARIN SODIUM 5 MG PO TABS
5.0000 mg | ORAL_TABLET | Freq: Once | ORAL | Status: AC
  Administered 2011-08-30: 5 mg via ORAL
  Filled 2011-08-30: qty 1

## 2011-08-30 MED ORDER — IPRATROPIUM BROMIDE 0.02 % IN SOLN
0.5000 mg | Freq: Three times a day (TID) | RESPIRATORY_TRACT | Status: DC
  Administered 2011-08-30 – 2011-09-01 (×8): 0.5 mg via RESPIRATORY_TRACT
  Filled 2011-08-30 (×7): qty 2.5

## 2011-08-30 MED ORDER — LEVALBUTEROL HCL 0.63 MG/3ML IN NEBU
0.6300 mg | INHALATION_SOLUTION | Freq: Three times a day (TID) | RESPIRATORY_TRACT | Status: DC
  Administered 2011-08-30 – 2011-09-01 (×8): 0.63 mg via RESPIRATORY_TRACT
  Filled 2011-08-30 (×10): qty 3

## 2011-08-30 NOTE — Progress Notes (Signed)
ANTICOAGULATION CONSULT NOTE - Initial Consult  Pharmacy Consult for warfarin  Indication: atrial fibrillation  No Known Allergies  Vital Signs: Temp: 97.4 F (36.3 C) (01/21 0529) Temp src: Oral (01/21 0529) BP: 116/67 mmHg (01/21 0529) Pulse Rate: 79  (01/21 0529)  Labs:  Alvira Philips 08/30/11 0515 08/29/11 1611 08/29/11 0815 08/28/11 2351 08/28/11 1700 08/28/11 1205  HGB -- -- -- 13.4 13.7 --  HCT -- -- -- 41.6 41.9 40.6  PLT -- -- -- 153 148* 131*  APTT -- -- -- -- -- --  LABPROT 15.3* -- -- 15.1 -- --  INR 1.18 -- -- 1.17 -- --  HEPARINUNFRC -- -- -- -- -- --  CREATININE 0.85 -- -- 0.90 0.85 --  CKTOTAL -- 32 36 37 -- --  CKMB -- 2.4 2.6 2.5 -- --  TROPONINI -- <0.30 <0.30 <0.30 -- --   Estimated Creatinine Clearance: 71.8 ml/min (by C-G formula based on Cr of 0.85).  Medical History: Past Medical History  Diagnosis Date  . Atrial fibrillation   . Long-term (current) use of anticoagulants   . Glaucoma   . Gout   . Hyperthyroidism   . Orthostatic hypotension   . Redundant prepuce and phimosis   . Acute cystitis   . Hypertonicity of bladder   . Bradycardia        . HTN (hypertension)   . Atelectasis   . Dysautonomia   . Pacemaker     St. Jude; device generator replacement 2012  . CHF (congestive heart failure)     Previous preserved EF.  [name] Sleep apnea     does not use cpap not in 4 yrs  . Chronic kidney disease [facility]     UTI  1 MO AGO [place] MEDICALLY     Medications:  Prescriptions prior to admission  Medication Sig Dispense Refill  . acetaminophen (TYLENOL) 500 MG tablet Take 500-1,000 mg by mouth every 6 (six) hours as needed. For pain      . Chlorhexidine Gluconate Cloth 2 % PADS Apply 6 each topically daily. To cleanse arms, legs, abdomen and back (except incision site) for 4 days starting yesterday 08/27/11      . dorzolamide-timolol (COSOPT) 22.3-6.8 MG/ML ophthalmic solution Place 1 drop into both eyes daily.       . finasteride (PROSCAR) 5  MG tablet Take 5 mg by mouth daily.       . fludrocortisone (FLORINEF) 0.1 MG tablet Take 0.1 mg by mouth daily.       [name] levothyroxine (SYNTHROID, LEVOTHROID) 175 MCG tablet Take 175 mcg by mouth daily.       . midodrine (PROAMATINE) 10 MG tablet Take 5 mg by mouth 3 (three) times daily. Take 1/2 tablet by mouth at 8am, 12pm, and 4pm      . mupirocin ointment (BACTROBAN) 2 % Apply 1 application topically 2 (two) times daily. Apply to both nostrils for 4 days - starting yesterday 08/27/11      . pyridostigmine (MESTINON) 60 MG tablet Take 60 mg by mouth 2 (two) times daily. Take one tablet every morning & one tablet at lunch.      . silodosin (RAPAFLO) 8 MG CAPS capsule Take 8 mg by mouth daily with lunch.      . warfarin (COUMADIN) 5 MG tablet Take 2.5 mg by mouth daily. Start back on Sunday 1/20 - 1/2 tablet daily - contact coumadin clinic for blood level check in 1-2 weeks      .  HYDROcodone-acetaminophen (NORCO) 5-325 MG per tablet Take 1-2 tablets by mouth every 6 (six) hours as needed. Not yet picked up        Assessment: 47 yom admitted with heartburn and shortness of breath.  He was discharged 1/19 from the hospital s/p inguinal hernia repair.  INR is subtherapeutic.  Home dose of coumadin is 2.5mg  daily.    Goal of Therapy:  INR 2-3   Plan:  1.Coumadin 5mg  today x 1 dose. 2. Continue lovenox 40mg  SQ qday til INR>2 3. Daily PT/INR   Bobbye Petti Poteet 08/30/2011,8:38 AM

## 2011-08-30 NOTE — Progress Notes (Signed)
PATIENT DETAILS Name: Marco Gregory Age: 78 y.o. Sex: male Date of Birth: [birth year] Admit Date: 08/28/2011 ZOX:[id number] Brackbill, MD, MD  Subjective: Slow improvement  Objective: Vital signs in last 24 hours: Filed Vitals:   08/30/11 0529 08/30/11 0852 08/30/11 1323 08/30/11 1446  BP: 116/67  98/62 125/75  Pulse: 79  121   Temp: 97.4 F (36.3 C)  97.9 F (36.6 C)   TempSrc: Oral  Oral   Resp: 20  18   Height:      Weight: 94.3 kg (207 lb 14.3 oz)     SpO2: 95% 98%      Weight change: -0.955 kg (-2 lb 1.7 oz)  Body mass index is 25.98 kg/(m^2).  Intake/Output from previous day:  Intake/Output Summary (Last 24 hours) at 08/30/11 1501 Last data filed at 08/30/11 1257  Gross per 24 hour  Intake    970 ml  Output    875 ml  Net     95 ml    PHYSICAL EXAM: Gen Exam: Awake and alert with clear speech.   Neck: Supple, No JVD.   Chest: B/L Clear.  Rhonchi heard today CVS: S1 S2 Regular, no murmurs.  Abdomen: soft, BS +, non tender, non distended.  Extremities: no edema, lower extremities warm to touch. Neurologic: Non Focal.   Skin: No Rash.  Wounds: N/A.    CONSULTS:  cardiology  LAB RESULTS: CBC  Lab 08/28/11 2351 08/28/11 1700 08/28/11 1205 08/25/11 1540  WBC 8.2 8.0 10.2 5.8  HGB 13.4 13.7 13.5 14.1  HCT 41.6 41.9 40.6 42.2  PLT 153 148* 131* 146*  MCV 97.2 97.0 97.1 95.9  MCH 31.3 31.7 32.3 32.0  MCHC 32.2 32.7 33.3 33.4  RDW 12.8 12.7 12.7 12.8  LYMPHSABS -- -- 1.0 --  MONOABS -- -- 1.2* --  EOSABS -- -- 0.0 --  BASOSABS -- -- 0.0 --  BANDABS -- -- -- --    Chemistries   Lab 08/30/11 0515 08/28/11 2351 08/28/11 1700 08/28/11 1205 08/26/11 1236 08/25/11 1540  NA 136 136 -- 136 -- 138  K 4.0 4.3 -- 4.2 -- 4.3  CL 100 98 -- 100 -- 101  CO2 28 30 -- 28 -- 31  GLUCOSE 102* 112* -- 121* -- 105*  BUN 18 18 -- 17 -- 20  CREATININE 0.85 0.90 0.85 0.79 0.87 --  CALCIUM 9.0 9.6 -- 9.8 -- 9.7  MG -- -- -- -- -- --    GFR Estimated  Creatinine Clearance: 71.8 ml/min (by C-G formula based on Cr of 0.85).  Coagulation profile  Lab 08/30/11 0515 08/28/11 2351 08/25/11 1540 08/25/11 1044  INR 1.18 1.17 1.10 1.2  PROTIME -- -- -- --    Cardiac Enzymes  Lab 08/29/11 1611 08/29/11 0815 08/28/11 2351  CKMB 2.4 2.6 2.5  TROPONINI <0.30 <0.30 <0.30  MYOGLOBIN -- -- --    No components found with this basename: POCBNP:3 No results found for this basename: DDIMER:2 in the last 72 hours No results found for this basename: HGBA1C:2 in the last 72 hours No results found for this basename: CHOL:2,HDL:2,LDLCALC:2,TRIG:2,CHOLHDL:2,LDLDIRECT:2 in the last 72 hours No results found for this basename: TSH,T4TOTAL,FREET3,T3FREE,THYROIDAB in the last 72 hours No results found for this basename: VITAMINB12:2,FOLATE:2,FERRITIN:2,TIBC:2,IRON:2,RETICCTPCT:2 in the last 72 hours No results found for this basename: LIPASE:2,AMYLASE:2 in the last 72 hours  Urine Studies No results found for this basename: UACOL:2,UAPR:2,USPG:2,UPH:2,UTP:2,UGL:2,UKET:2,UBIL:2,UHGB:2,UNIT:2,UROB:2,ULEU:2,UEPI:2,UWBC:2,URBC:2,UBAC:2,CAST:2,CRYS:2,UCOM:2,BILUA:2 in the last 72 hours  MICROBIOLOGY: Recent Results (from the past 240 hour(s))  SURGICAL PCR SCREEN     Status: Abnormal   Collection Time   08/25/11  3:38 PM      Component Value Range Status Comment   MRSA, PCR POSITIVE (*) NEGATIVE  Final    Staphylococcus aureus POSITIVE (*) NEGATIVE  Final     RADIOLOGY STUDIES/RESULTS: Dg Chest Port 1 View  08/29/2011  *RADIOLOGY REPORT*  Clinical Data: Shortness of breath  PORTABLE CHEST - 1 VIEW  Comparison: 08/28/2011  Findings: Dual lead left-sided pacer noted.  There has been minimal interval improvement in probable interstitial edema since previously.  Minimally improved aeration is noted with persistent bibasilar atelectasis.  The hila remain prominent bilaterally, likely reflecting central venous congestion.  IMPRESSION: Minimally improved aeration  and interstitial edema.  Original Report Authenticated By: Harrel Lemon, M.D.   Dg Chest Portable 1 View  08/28/2011  *RADIOLOGY REPORT*  Clinical Data: Shortness of breath and wheezing.  PORTABLE CHEST - 1 VIEW  Comparison: PA and lateral chest 04/14/2011.  Findings: There is cardiomegaly.  Bilateral interstitial opacities most prominent bases are likely due to pulmonary edema.  No pneumothorax is identified.  IMPRESSION: Cardiomegaly and interstitial opacities most consistent with pulmonary edema.  Original Report Authenticated By: Bernadene Bell. D'ALESSIO, M.D.    MEDICATIONS: Scheduled Meds:    . Chlorhexidine Gluconate Cloth  6 each Topical Daily  . dorzolamide  1 drop Both Eyes TID  . enoxaparin  40 mg Subcutaneous Q24H  . finasteride  5 mg Oral Daily  . fludrocortisone  0.1 mg Oral Daily  . ipratropium  0.5 mg Nebulization TID  . levalbuterol  0.63 mg Nebulization TID  . levothyroxine  175 mcg Oral QAC breakfast  . midodrine  5 mg Oral TID WC  . moxifloxacin  400 mg Intravenous Q24H  . mupirocin ointment  1 application Topical BID  . pantoprazole  40 mg Oral Q1200  . pyridostigmine  60 mg Oral BID  . silodosin  8 mg Oral Q lunch  . timolol  1 drop Both Eyes Daily  . warfarin  5 mg Oral ONCE-1800  . warfarin  5 mg Oral ONCE-1800  . DISCONTD: furosemide  20 mg Intravenous Daily  . DISCONTD: ipratropium  0.5 mg Nebulization Q6H  . DISCONTD: levalbuterol  0.63 mg Nebulization Q6H   Continuous Infusions:  PRN Meds:.acetaminophen, alum & mag hydroxide-simeth, HYDROcodone-acetaminophen, ondansetron (ZOFRAN) IV  Antibiotics: Anti-infectives     Start     Dose/Rate Route Frequency Ordered Stop   08/28/11 1800   moxifloxacin (AVELOX) IVPB 400 mg        400 mg 250 mL/hr over 60 Minutes Intravenous Every 24 hours 08/28/11 1646     08/28/11 1245   moxifloxacin (AVELOX) IVPB 400 mg        400 mg 250 mL/hr over 60 Minutes Intravenous  Once 08/28/11 1234 08/28/11 1358           Assessment/Plan: Patient Active Hospital Problem List:  Shortness of breath Likely secondary to CHF decompensation-?Diastolic dysfunction Awaiting 2-D echo Will need to be carefull with lasix and anti-hypertensives as has long standing h/o orthostatic hypotension Appreciate cardiology input, doubt infectious issue as patient was now afebrile and never had a elevated  white count Chest x-ray essentially unchanged, if continues to have symptoms may need a CT scan of the chest.  Hypotension-orthostatic  Episode of orthostasis today likely secondary to furosemide-no further Lasix per cardiology  Continue with midodrine, florinef and mestinon  Atrial fibrillation  controlled Coumadin now  resumed-dosing per pharmacy    ASTHMATIC BRONCHITIS,  Better, less wheezing today Continue with empiric avelox and bronchodilators  Chest pain -cardiac enzymes negtive -likely GI origin-continue with PPI  Status post inguinal hernia repair Outpatient follow up with [facility]  Disposition: Remain inpatient  DVT Prophylaxis: Prophylactic Lovenox till INR >2  Code Status: Full Code  Maretta Bees,  MD. 08/30/2011, 3:01 PM

## 2011-08-30 NOTE — Plan of Care (Signed)
Problem: Food- and Nutrition-Related Knowledge Deficit (NB-1.1) Goal: Nutrition education Formal process to instruct or train a patient/client in a skill or to impart knowledge to help patients/clients voluntarily manage or modify food choices and eating behavior to maintain or improve health.  Outcome: Completed/Met Date Met:  08/30/11 RD spoke with patient about his diet, states his MD told him to follow a high sodium diet to increase BP. RD and patient discussed current diet and ways to balance sodium intake. Patient likely has new onset CHF per notes, and will need to follow a low sodium diet in the future. Patient stated  He understood and had no questions. Patient will address sodium intake with MD. Chart reviewed, no additional nutrition intervention needed.   Clarene Duke MARIE

## 2011-08-30 NOTE — Progress Notes (Signed)
Utilization review completed.  

## 2011-08-30 NOTE — Progress Notes (Signed)
Patient Name: Marco Gregory      SUBJECTIVE: feeling better but not back to baseline Still with coughing up some sputum  Past Medical History  Diagnosis Date  . Atrial fibrillation   . Long-term (current) use of anticoagulants   . Glaucoma   . Gout   . Hyperthyroidism   . Orthostatic hypotension   . Redundant prepuce and phimosis   . Acute cystitis   . Hypertonicity of bladder   . Bradycardia        . HTN (hypertension)   . Atelectasis   . Dysautonomia   . Pacemaker     St. Jude; device generator replacement 2012  . CHF (congestive heart failure)     Previous preserved EF.  [name] Sleep apnea     does not use cpap not in 4 yrs  . Chronic kidney disease [facility]     UTI  1 MO AGO TX MEDICALLY     PHYSICAL EXAM Filed Vitals:   08/29/11 1958 08/29/11 2130 08/30/11 0529 08/30/11 0852  BP:  109/97 116/67   Pulse: 83 91 79   Temp:  98.1 F (36.7 C) 97.4 F (36.3 C)   TempSrc:  Oral Oral   Resp: 18 20 20    Height:      Weight:   207 lb 14.3 oz (94.3 kg)   SpO2:  97% 95% 98%    Well developed and nourished in no acute distress HENT normal Neck supple with JVP-flat Carotids brisk and full without bruits Clear x occas wheeze Regular rate and rhythm, no murmurs or gallops Abd-soft with active BS without hepatomegaly No Clubbing cyanosis tr edema Skin-warm and dry A & Oriented  Grossly normal sensory and motor function       Intake/Output Summary (Last 24 hours) at 08/30/11 0955 Last data filed at 08/30/11 0800  Gross per 24 hour  Intake   1097 ml  Output   1651 ml  Net   -554 ml    LABS: Basic Metabolic Panel:  Lab 08/30/11 9604 08/28/11 2351 08/28/11 1700 08/28/11 1205 08/26/11 1236 08/25/11 1540  NA 136 136 -- 136 -- 138  K 4.0 4.3 -- 4.2 -- 4.3  CL 100 98 -- 100 -- 101  CO2 28 30 -- 28 -- 31  GLUCOSE 102* 112* -- 121* -- 105*  BUN 18 18 -- 17 -- 20  CREATININE 0.85 0.90 0.85 0.79 0.87 0.95  CALCIUM 9.0 9.6 -- -- -- --  MG -- -- --  -- -- --  PHOS -- -- -- -- -- --   Cardiac Enzymes:  Basename 08/29/11 1611 08/29/11 0815 08/28/11 2351  CKTOTAL 32 36 37  CKMB 2.4 2.6 2.5  CKMBINDEX -- -- --  TROPONINI <0.30 <0.30 <0.30   CBC:  Lab 08/28/11 2351 08/28/11 1700 08/28/11 1205 08/25/11 1540  WBC 8.2 8.0 10.2 5.8  NEUTROABS -- -- 7.9* --  HGB 13.4 13.7 13.5 14.1  HCT 41.6 41.9 40.6 42.2  MCV 97.2 97.0 97.1 95.9  PLT 153 148* 131* 146*   PROTIME:  Basename 08/30/11 0515 08/28/11 2351  LABPROT 15.3* 15.1  INR 1.18 1.17        ASSESSMENT AND PLAN:  Pt has complicated issues with HFPEF and orthostatic hypotension which has been lifestyle limiting and for which he has been on multiple medications including florinef and mestinon and mitodrine, obviously the first the most problematic in this regard.  I suspect that there is an infectious component here.  The other issues may be better handled very gently as his orthostasis today was aggravated by his diuresis yesterday  Hold lasix, OOB>>chair all day Continue other OI meds Continue warfarin  Patient Active Hospital Problem List: SOB (shortness of breath) (08/28/2011)  Hypotension-orthostatic (12/03/2008)  Atrial fibrillation (12/03/2008)  ASTHMATIC BRONCHITIS, ACUTE (11/24/2007) PACEMAKER, PERMANENT (12/03/2008)   Status post inguinal hernia repair (08/28/2011)    Signed, Sherryl Manges MD  08/30/2011

## 2011-08-31 ENCOUNTER — Inpatient Hospital Stay (HOSPITAL_COMMUNITY): Payer: Medicare Other

## 2011-08-31 ENCOUNTER — Encounter (HOSPITAL_COMMUNITY): Payer: Self-pay | Admitting: Surgery

## 2011-08-31 LAB — BASIC METABOLIC PANEL
BUN: 19 mg/dL (ref 6–23)
Calcium: 9.3 mg/dL (ref 8.4–10.5)
Creatinine, Ser: 0.93 mg/dL (ref 0.50–1.35)
GFR calc non Af Amer: 73 mL/min — ABNORMAL LOW (ref >90)
Glucose, Bld: 111 mg/dL — ABNORMAL HIGH (ref 70–99)
Sodium: 138 mEq/L (ref 135–145)

## 2011-08-31 LAB — CBC
Hemoglobin: 13.6 g/dL (ref 13.0–17.0)
MCH: 32 pg (ref 26.0–34.0)
MCHC: 33 g/dL (ref 30.0–36.0)
MCV: 96.9 fL (ref 78.0–100.0)

## 2011-08-31 LAB — PROTIME-INR: Prothrombin Time: 15 seconds (ref 11.6–15.2)

## 2011-08-31 MED ORDER — WARFARIN SODIUM 5 MG PO TABS
5.0000 mg | ORAL_TABLET | Freq: Once | ORAL | Status: AC
  Administered 2011-08-31: 5 mg via ORAL
  Filled 2011-08-31 (×2): qty 1

## 2011-08-31 MED ORDER — MOXIFLOXACIN HCL 400 MG PO TABS
400.0000 mg | ORAL_TABLET | Freq: Every day | ORAL | Status: DC
  Administered 2011-08-31: 400 mg via ORAL
  Filled 2011-08-31 (×2): qty 1

## 2011-08-31 NOTE — Progress Notes (Signed)
PATIENT DETAILS Name: Marco Gregory Age: 78 y.o. Sex: male Date of Birth: Jan 20, 1921 Admit Date: 08/28/2011 BJY:[id number] Brackbill, MD, MD  Brief summary Patient is an 78 year old with a long-standing history of severe orthostatic hypotension on Florinef, Midrin been and Mestinon, who was just discharged one day prior to this hospitalization after a left inguinal hernia repair, came back for sudden onset of shortness of breath. His chest x-ray was suggestive of pulmonary edema. He was subsequently admitted to the hospitalist service. Unfortunately we have to be very cautious with diuresis given his lifestyle limiting orthostatic hypotension. Cardiology continues to follow. The patient is definitely better however still not back to his baseline, in fact on ambulation his oxygen saturation dropped to 76% today. His x-ray of his chest done today shows some improvement compared to the one on admission, however does not much change from the past few days. Physical therapy evaluation is still pending.  Subjective: Slow improvement continues-not quite back to baseline 02 saturation with ambulation decreased to 76%!  Objective: Vital signs in last 24 hours: Filed Vitals:   08/30/11 2103 08/31/11 0504 08/31/11 0736 08/31/11 1441  BP: 130/79 100/65  125/69  Pulse: 101 71  69  Temp: 97.9 F (36.6 C) 97.6 F (36.4 C)  97.7 F (36.5 C)  TempSrc: Oral     Resp: 18 18  18   Height:      Weight:  94.9 kg (209 lb 3.5 oz)    SpO2: 98% 95% 91% 90%    Weight change: 0.6 kg (1 lb 5.2 oz)  Body mass index is 26.15 kg/(m^2).  Intake/Output from previous day:  Intake/Output Summary (Last 24 hours) at 08/31/11 1451 Last data filed at 08/31/11 1015  Gross per 24 hour  Intake    640 ml  Output   1675 ml  Net  -1035 ml    PHYSICAL EXAM: Gen Exam: Awake and alert with clear speech.   Neck: Supple, No JVD.   Chest: B/L Clear.  Rhonchi heard today as well CVS: S1 S2 Regular, no murmurs.    Abdomen: soft, BS +, non tender, non distended.  Extremities: no edema, lower extremities warm to touch. Neurologic: Non Focal.   Skin: No Rash.  Wounds: N/A.    CONSULTS:  cardiology  LAB RESULTS: CBC  Lab 08/31/11 0507 08/28/11 2351 08/28/11 1700 08/28/11 1205 08/25/11 1540  WBC 6.4 8.2 8.0 10.2 5.8  HGB 13.6 13.4 13.7 13.5 14.1  HCT 41.2 41.6 41.9 40.6 42.2  PLT 166 153 148* 131* 146*  MCV 96.9 97.2 97.0 97.1 95.9  MCH 32.0 31.3 31.7 32.3 32.0  MCHC 33.0 32.2 32.7 33.3 33.4  RDW 12.7 12.8 12.7 12.7 12.8  LYMPHSABS -- -- -- 1.0 --  MONOABS -- -- -- 1.2* --  EOSABS -- -- -- 0.0 --  BASOSABS -- -- -- 0.0 --  BANDABS -- -- -- -- --    Chemistries   Lab 08/31/11 0507 08/30/11 0515 08/28/11 2351 08/28/11 1700 08/28/11 1205 08/25/11 1540  NA 138 136 136 -- 136 138  K 4.0 4.0 4.3 -- 4.2 4.3  CL 99 100 98 -- 100 101  CO2 30 28 30  -- 28 31  GLUCOSE 111* 102* 112* -- 121* 105*  BUN 19 18 18  -- 17 20  CREATININE 0.93 0.85 0.90 0.85 0.79 --  CALCIUM 9.3 9.0 9.6 -- 9.8 9.7  MG -- -- -- -- -- --    GFR Estimated Creatinine Clearance: 65.6 ml/min (by C-G formula  based on Cr of 0.93).  Coagulation profile  Lab 08/31/11 0507 08/30/11 0515 08/28/11 2351 08/25/11 1540 08/25/11 1044  INR 1.16 1.18 1.17 1.10 1.2  PROTIME -- -- -- -- --    Cardiac Enzymes  Lab 08/29/11 1611 08/29/11 0815 08/28/11 2351  CKMB 2.4 2.6 2.5  TROPONINI <0.30 <0.30 <0.30  MYOGLOBIN -- -- --    No components found with this basename: POCBNP:3 No results found for this basename: DDIMER:2 in the last 72 hours No results found for this basename: HGBA1C:2 in the last 72 hours No results found for this basename: CHOL:2,HDL:2,LDLCALC:2,TRIG:2,CHOLHDL:2,LDLDIRECT:2 in the last 72 hours No results found for this basename: TSH,T4TOTAL,FREET3,T3FREE,THYROIDAB in the last 72 hours No results found for this basename: VITAMINB12:2,FOLATE:2,FERRITIN:2,TIBC:2,IRON:2,RETICCTPCT:2 in the last 72 hours No  results found for this basename: LIPASE:2,AMYLASE:2 in the last 72 hours  Urine Studies No results found for this basename: UACOL:2,UAPR:2,USPG:2,UPH:2,UTP:2,UGL:2,UKET:2,UBIL:2,UHGB:2,UNIT:2,UROB:2,ULEU:2,UEPI:2,UWBC:2,URBC:2,UBAC:2,CAST:2,CRYS:2,UCOM:2,BILUA:2 in the last 72 hours  MICROBIOLOGY: Recent Results (from the past 240 hour(s))  SURGICAL PCR SCREEN     Status: Abnormal   Collection Time   08/25/11  3:38 PM      Component Value Range Status Comment   MRSA, PCR POSITIVE (*) NEGATIVE  Final    Staphylococcus aureus POSITIVE (*) NEGATIVE  Final     RADIOLOGY STUDIES/RESULTS: Dg Chest Port 1 View  08/29/2011  *RADIOLOGY REPORT*  Clinical Data: Shortness of breath  PORTABLE CHEST - 1 VIEW  Comparison: 08/28/2011  Findings: Dual lead left-sided pacer noted.  There has been minimal interval improvement in probable interstitial edema since previously.  Minimally improved aeration is noted with persistent bibasilar atelectasis.  The hila remain prominent bilaterally, likely reflecting central venous congestion.  IMPRESSION: Minimally improved aeration and interstitial edema.  Original Report Authenticated By: Harrel Lemon, M.D.   Dg Chest Portable 1 View  08/28/2011  *RADIOLOGY REPORT*  Clinical Data: Shortness of breath and wheezing.  PORTABLE CHEST - 1 VIEW  Comparison: PA and lateral chest 04/14/2011.  Findings: There is cardiomegaly.  Bilateral interstitial opacities most prominent bases are likely due to pulmonary edema.  No pneumothorax is identified.  IMPRESSION: Cardiomegaly and interstitial opacities most consistent with pulmonary edema.  Original Report Authenticated By: Bernadene Bell. D'ALESSIO, M.D.    MEDICATIONS: Scheduled Meds:    . Chlorhexidine Gluconate Cloth  6 each Topical Daily  . dorzolamide  1 drop Both Eyes TID  . enoxaparin  40 mg Subcutaneous Q24H  . finasteride  5 mg Oral Daily  . fludrocortisone  0.1 mg Oral Daily  . ipratropium  0.5 mg Nebulization TID    . levalbuterol  0.63 mg Nebulization TID  . levothyroxine  175 mcg Oral QAC breakfast  . midodrine  5 mg Oral TID WC  . moxifloxacin  400 mg Oral q1800  . mupirocin ointment  1 application Topical BID  . pantoprazole  40 mg Oral Q1200  . pyridostigmine  60 mg Oral BID  . silodosin  8 mg Oral Q lunch  . timolol  1 drop Both Eyes Daily  . warfarin  5 mg Oral ONCE-1800  . warfarin  5 mg Oral ONCE-1800  . DISCONTD: moxifloxacin  400 mg Intravenous Q24H   Continuous Infusions:  PRN Meds:.acetaminophen, alum & mag hydroxide-simeth, HYDROcodone-acetaminophen, ondansetron (ZOFRAN) IV  Antibiotics: Anti-infectives     Start     Dose/Rate Route Frequency Ordered Stop   08/31/11 1800   moxifloxacin (AVELOX) tablet 400 mg        400 mg Oral Daily-1800  08/31/11 0925     08/28/11 1800   moxifloxacin (AVELOX) IVPB 400 mg  Status:  Discontinued        400 mg 250 mL/hr over 60 Minutes Intravenous Every 24 hours 08/28/11 1646 08/31/11 0924   08/28/11 1245   moxifloxacin (AVELOX) IVPB 400 mg        400 mg 250 mL/hr over 60 Minutes Intravenous  Once 08/28/11 1234 08/28/11 1358          Assessment/Plan: Patient Active Hospital Problem List:  Shortness of breath Likely secondary to CHF decompensation-?Diastolic dysfunction  2-D echo shows EF around 55-60% Still has rhonchi on exam, would likely need oxygen at discharge. Lasix held since yesterday, as has severe orthostatic hypotension and developed hypotension the day before. Appreciate cardiology input, doubt infectious issue as patient was now afebrile and never had a elevated  white count  Hypotension-orthostatic  Episode of orthostasis yesterday likely secondary to furosemide-no further Lasix per cardiology  Continue with midodrine, florinef and mestinon  Atrial fibrillation  controlled Coumadin now resumed-dosing per pharmacy    ASTHMATIC BRONCHITIS,  Better but still wheezing today Continue with empiric avelox and  bronchodilators  Chest pain -cardiac enzymes negtive -likely GI origin-continue with PPI  Status post inguinal hernia repair Outpatient follow up with [facility]  Disposition: Remain inpatient-will need Oxygen on discharge, and   DVT Prophylaxis: Prophylactic Lovenox till INR >2  Code Status: Full Code  Maretta Bees,  MD. 08/31/2011, 2:51 PM

## 2011-08-31 NOTE — Progress Notes (Signed)
Social Visit  I repaired left inguinal hernia on patient last week. Patient readmitted right after discharge with CHF. On isolation for MRSA colonization. Left inguinal wound looks good. Anticipates being discharged later today or tomorrow. I'll see him in the office in 3 to 4 weeks.  Ovidio Kin, MD, [facility] Pager: [phone] Office phone:  [phone]

## 2011-08-31 NOTE — Progress Notes (Signed)
Pt o2 saturation at rest on 2L o2 was 90-97%.  Pt ambulated towards hallway on RA and o2 saturation dropped to 76% RA. Pt was placed on back on 2L of o2. Dr. Jerral Ralph notified.

## 2011-08-31 NOTE — Progress Notes (Signed)
ANTICOAGULATION CONSULT NOTE - Initial Consult  Pharmacy Consult for warfarin  Indication: atrial fibrillation  No Known Allergies  Vital Signs: Temp: 97.6 F (36.4 C) (01/22 0504) BP: 100/65 mmHg (01/22 0504) Pulse Rate: 71  (01/22 0504)  Labs:  Basename 08/31/11 0507 08/30/11 0515 08/29/11 1611 08/29/11 0815 08/28/11 2351 08/28/11 1700  HGB 13.6 -- -- -- 13.4 --  HCT 41.2 -- -- -- 41.6 41.9  PLT 166 -- -- -- 153 148*  APTT -- -- -- -- -- --  LABPROT 15.0 15.3* -- -- 15.1 --  INR 1.16 1.18 -- -- 1.17 --  HEPARINUNFRC -- -- -- -- -- --  CREATININE 0.93 0.85 -- -- 0.90 --  CKTOTAL -- -- 32 36 37 --  CKMB -- -- 2.4 2.6 2.5 --  TROPONINI -- -- <0.30 <0.30 <0.30 --   Estimated Creatinine Clearance: 65.6 ml/min (by C-G formula based on Cr of 0.93).  Medical History: Past Medical History  Diagnosis Date  . Atrial fibrillation   . Long-term (current) use of anticoagulants   . Glaucoma   . Gout   . Hyperthyroidism   . Orthostatic hypotension   . Redundant prepuce and phimosis   . Acute cystitis   . Hypertonicity of bladder   . Bradycardia        . HTN (hypertension)   . Atelectasis   . Dysautonomia   . Pacemaker     St. Jude; device generator replacement 2012  . CHF (congestive heart failure)     Previous preserved EF.  [name] Sleep apnea     does not use cpap not in 4 yrs  . Chronic kidney disease [facility]     UTI  1 MO AGO TX MEDICALLY     Medications:  Prescriptions prior to admission  Medication Sig Dispense Refill  . acetaminophen (TYLENOL) 500 MG tablet Take 500-1,000 mg by mouth every 6 (six) hours as needed. For pain      . Chlorhexidine Gluconate Cloth 2 % PADS Apply 6 each topically daily. To cleanse arms, legs, abdomen and back (except incision site) for 4 days starting yesterday 08/27/11      . dorzolamide-timolol (COSOPT) 22.3-6.8 MG/ML ophthalmic solution Place 1 drop into both eyes daily.       . finasteride (PROSCAR) 5 MG tablet Take 5 mg by  mouth daily.       . fludrocortisone (FLORINEF) 0.1 MG tablet Take 0.1 mg by mouth daily.       [name] levothyroxine (SYNTHROID, LEVOTHROID) 175 MCG tablet Take 175 mcg by mouth daily.       . midodrine (PROAMATINE) 10 MG tablet Take 5 mg by mouth 3 (three) times daily. Take 1/2 tablet by mouth at 8am, 12pm, and 4pm      . mupirocin ointment (BACTROBAN) 2 % Apply 1 application topically 2 (two) times daily. Apply to both nostrils for 4 days - starting yesterday 08/27/11      . pyridostigmine (MESTINON) 60 MG tablet Take 60 mg by mouth 2 (two) times daily. Take one tablet every morning & one tablet at lunch.      . silodosin (RAPAFLO) 8 MG CAPS capsule Take 8 mg by mouth daily with lunch.      . warfarin (COUMADIN) 5 MG tablet Take 2.5 mg by mouth daily. Start back on Sunday 1/20 - 1/2 tablet daily - contact coumadin clinic for blood level check in 1-2 weeks      . HYDROcodone-acetaminophen (NORCO) 5-325 MG per  tablet Take 1-2 tablets by mouth every 6 (six) hours as needed. Not yet picked up        Assessment: 10 yom admitted with heartburn and shortness of breath.  He was discharged 1/19 from the hospital s/p inguinal hernia repair.  INR is subtherapeutic.  Home dose of coumadin is 2.5mg  daily.    Goal of Therapy:  INR 2-3   Plan:  1.Coumadin 5mg  today x 1 dose. 2. Continue lovenox 40mg  SQ qday til INR>2 3. Daily PT/INR   Talbert Cage Poteet 08/31/2011,9:23 AM

## 2011-08-31 NOTE — Progress Notes (Signed)
Subjective:  The patient states he feels better this am.  He is anxious to go home.  Afebrile.  BP is back to baseline for him, on midodrine and florinef.  Objective:  Vital Signs in the last 24 hours: Temp:  [97.6 F (36.4 C)-97.9 F (36.6 C)] 97.6 F (36.4 C) (01/22 0504) Pulse Rate:  [71-121] 71  (01/22 0504) Resp:  [18] 18  (01/22 0504) BP: (98-130)/(62-79) 100/65 mmHg (01/22 0504) SpO2:  [91 %-98 %] 91 % (01/22 0736) Weight:  [209 lb 3.5 oz (94.9 kg)] 209 lb 3.5 oz (94.9 kg) (01/22 0504)  Intake/Output from previous day: 01/21 0701 - 01/22 0700 In: 480 [P.O.:480] Out: 1500 [Urine:1500] Intake/Output from this shift: Total I/O In: -  Out: 250 [Urine:250]     . Chlorhexidine Gluconate Cloth  6 each Topical Daily  . dorzolamide  1 drop Both Eyes TID  . enoxaparin  40 mg Subcutaneous Q24H  . finasteride  5 mg Oral Daily  . fludrocortisone  0.1 mg Oral Daily  . ipratropium  0.5 mg Nebulization TID  . levalbuterol  0.63 mg Nebulization TID  . levothyroxine  175 mcg Oral QAC breakfast  . midodrine  5 mg Oral TID WC  . moxifloxacin  400 mg Intravenous Q24H  . mupirocin ointment  1 application Topical BID  . pantoprazole  40 mg Oral Q1200  . pyridostigmine  60 mg Oral BID  . silodosin  8 mg Oral Q lunch  . timolol  1 drop Both Eyes Daily  . warfarin  5 mg Oral ONCE-1800  . DISCONTD: furosemide  20 mg Intravenous Daily      Physical Exam: The patient appears to be in no distress.  Head and neck exam reveals that the pupils are equal and reactive.  The extraocular movements are full.  There is no scleral icterus.  Mouth and pharynx are benign.  No lymphadenopathy.  No carotid bruits.  The jugular venous pressure is normal.  Thyroid is not enlarged or tender.  Chest:  Bilateral rhonchi but no respiratory distress.  Heart reveals no abnormal lift or heave.  First and second heart sounds are normal.  There is no murmur gallop rub or click.  The abdomen is soft and  nontender.  Bowel sounds are normoactive.  There is no hepatosplenomegaly or mass.  There are no abdominal bruits.  Extremities reveal no phlebitis or edema.  Pedal pulses are good.  There is no cyanosis or clubbing.  Neurologic exam is normal strength and no lateralizing weakness.  No sensory deficits.  Integument reveals no rash  Lab Results:  Basename 08/31/11 0507 08/28/11 2351  WBC 6.4 8.2  HGB 13.6 13.4  PLT 166 153    Basename 08/31/11 0507 08/30/11 0515  NA 138 136  K 4.0 4.0  CL 99 100  CO2 30 28  GLUCOSE 111* 102*  BUN 19 18  CREATININE 0.93 0.85    Basename 08/29/11 1611 08/29/11 0815  TROPONINI <0.30 <0.30   Hepatic Function Panel No results found for this basename: PROT,ALBUMIN,AST,ALT,ALKPHOS,BILITOT,BILIDIR,IBILI in the last 72 hours No results found for this basename: CHOL in the last 72 hours No results found for this basename: PROTIME in the last 72 hours  Imaging: Imaging results have been reviewed  Cardiac Studies: Atrial fib with V-Pacing Assessment/Plan:   He appears to be back to baseline at this point. Will add Mucinex to help with expectoration.  Can consider discharge today or tomorrow with followup in my office.  Continue re-coumadinization with followup in [place] coumadin clinic.   LOS: 3 days    Cassell Clement 08/31/2011, 8:11 AM

## 2011-09-01 LAB — PROTIME-INR: INR: 1.23 (ref 0.00–1.49)

## 2011-09-01 MED ORDER — GUAIFENESIN ER 1200 MG PO TB12: 1.0000 | ORAL_TABLET | Freq: Two times a day (BID) | ORAL | Status: DC

## 2011-09-01 MED ORDER — WARFARIN SODIUM 5 MG PO TABS
5.0000 mg | ORAL_TABLET | Freq: Once | ORAL | Status: DC
  Filled 2011-09-01: qty 1

## 2011-09-01 MED ORDER — GUAIFENESIN ER 600 MG PO TB12
600.0000 mg | ORAL_TABLET | Freq: Two times a day (BID) | ORAL | Status: DC | PRN
  Filled 2011-09-01: qty 1

## 2011-09-01 MED ORDER — MOXIFLOXACIN HCL 400 MG PO TABS: 400.0000 mg | ORAL_TABLET | Freq: Every day | ORAL | Status: AC

## 2011-09-01 NOTE — Progress Notes (Signed)
09/01/2011 Fransico Michael SPARKS Case Management Note [phone]  HOME HEALTH AGENCIES SERVING [facility]   Agencies that are Medicare-Certified and are affiliated with [facility]  Telephone Number Address  [facility]   The [facility] has ownership interest in this company; however, you are under no obligation to use this agency. [phone] or  [phone] [address] [place], [place] [address]   Agencies that are Medicare-Certified and are not affiliated with [facility] Agency Telephone Number Address  [facility] [phone] Fax [phone] [address], [address] [place], [place]  [address]  [facility] [phone] or [phone] Fax [phone] [address] [place], [place] [address]  [facility] [phone] Fax [phone] [address] [place], [place] [address]  [facility] [phone] Fax [phone] [address] [place], [place]  [address]  [facility] [phone] Fax [phone] [address], Suite [place], [place] [address]  [facility] [phone] [address] [place], [place] [address]  [facility] [phone]  [address] [place], [place] [address]  [facility] [phone] or [phone] Fax [phone] [address] [place], [place]  [address]  [facility] [phone] Fax [phone] [address] [place], [place]  [address]  [facility]  [phone] Fax [phone] [address] [place], [place] [address]               Agencies that are not Medicare-Certified and are not affiliated with [facility] Agency Telephone Number Address  [facility] [place] [phone] or [phone] Fax [phone] [address] [place]  [address]  [facility] [phone] Fax [phone] [address] [place], [place]  [address]  [facility]  [phone] Fax [phone] [address] [place], [place] [address]  [facility] [phone] Fax [phone] [address] [place], [place] [address]  [facility] [phone] or [phone] Fax [phone] [address] [place], [place]  [address]  [facility] [phone] or [phone] Fax [phone] [address], Suite [place], [place]  [address]  [facility] [phone] Fax [phone] [address] [place], [place]  [address]  [facility] [phone] [address] [place], [place]  [address]  [facility] [phone] Fax [phone] [address] [place], [place]  [address]  [facility] [phone] Fax [phone] [address] [place], [place]  [address]  [facility] [phone] Fax [phone] [address] [place], [place] [address]  [facility] [phone] Fax [phone] [address] [place], [place]  [address]   Choice of home health agencies presented to patient. [facility] chosen. Marie,RN and Derrian with [facility] notified of referral and oxygen needs.

## 2011-09-01 NOTE — Progress Notes (Signed)
Physical Therapy Evaluation Patient Details Name: Marco Gregory MRN: [id number] DOB: 1920-10-04 Today's Date: 09/01/2011  Problem List:  Patient Active Problem List  Diagnoses  . OBSTRUCTIVE SLEEP APNEA  . Hypotension-orthostatic  . Atrial fibrillation  . SICK SINUS/ TACHY-BRADY SYNDROME  . ASTHMATIC BRONCHITIS, ACUTE  . ATELECTASIS  . DYSAUTONOMIA  . PACEMAKER, PERMANENT  . Overactive bladder  . Hoarseness of voice  . Hypertension  . SOB (shortness of breath)  . Chest pain  . Status post inguinal hernia repair    Past Medical History:  Past Medical History  Diagnosis Date  . Atrial fibrillation   . Long-term (current) use of anticoagulants   . Glaucoma   . Gout   . Hyperthyroidism   . Orthostatic hypotension   . Redundant prepuce and phimosis   . Acute cystitis   . Hypertonicity of bladder   . Bradycardia        . HTN (hypertension)   . Atelectasis   . Dysautonomia   . Pacemaker     St. Jude; device generator replacement 2012  . CHF (congestive heart failure)     Previous preserved EF.  [name] Sleep apnea     does not use cpap not in 4 yrs  . Chronic kidney disease [facility]     UTI  1 MO AGO [place] MEDICALLY    Past Surgical History:  Past Surgical History  Procedure Date  . Appendectomy   . Hernia repair   . Tonsillectomy   . Cardioversion 12/19/2003    cardioversion and pacemaker interrogation  . Elbow bursa surgery 04/07/2006    Right elbow septic olecranon bursitis  . Toe amputation     Osteomyelitis, right foot second toe /  Right foot second ray amputation  . Pacemaker insertion 07/04/2002  . Insert / replace / remove pacemaker 07/04/2002 dr Graciela Husbands     dual chamber pacemaker - St. Jude model 1688 TC 58 cm active fixation lead was placed into the right ventricle and the St Jude model 1688 TC 52 cm active fixation pacing lead was placed in the right atrium.  The ventricular lead serial number was [id number] and the atrial lead serial number was  [id number]  Of note, the pacemaker generator was an identity [id number] serial number [id number]   . Eye surgery 01/2005  . Cardiac catheterization 07/03/2002    EF 60-70% /  Normal left ventricular function. / Very mild trivial three-vessel coronary atherosclerosis. / There is no mitral regurgitation noted  . Inguinal hernia repair 08/26/2011    Procedure: HERNIA REPAIR INGUINAL ADULT;  Surgeon: Kandis Cocking, MD;  Location: [facility] OR;  Service: General;  Laterality: Left;  open left inguinal hernia    PT Assessment/Plan/Recommendation PT Assessment Clinical Impression Statement: Pt is 78yo with recent hernia repair and return to hospital for SOB.  Pt's breathing has improved but still tends to desat with exertion and is experiencing generalized weakness.  Recommend acute PT as well as f/u with HHPT.   PT Recommendation/Assessment: Patient will need skilled PT in the acute care venue PT Problem List: Decreased strength;Decreased mobility;Decreased balance;Cardiopulmonary status limiting activity;Pain Barriers to Discharge: Inaccessible home environment Barriers to Discharge Comments: pt's bedroom requires ascending a flight of stairs but he could possibly stay on the main level short term PT Therapy Diagnosis : Difficulty walking;Generalized weakness;Acute pain PT Plan PT Frequency: Min 3X/week PT Treatment/Interventions: DME instruction;Stair training;Gait training;Functional mobility training;Therapeutic activities;Therapeutic exercise;Balance training;Patient/family education PT Recommendation Recommendations for Other Services: OT consult Follow  Up Recommendations: Home health PT Equipment Recommended: None recommended by PT PT Goals  Acute Rehab PT Goals PT Goal Formulation: With patient Time For Goal Achievement: 7 days Pt will go Sit to Stand: with modified independence PT Goal: Sit to Stand - Progress: Goal set today Pt will go Stand to Sit: with modified independence PT Goal: Stand to Sit -  Progress: Goal set today Pt will Ambulate: >150 feet;with modified independence;with least restrictive assistive device;with gait velocity >(comment) ft/second (>2.64 ft/sec) PT Goal: Ambulate - Progress: Goal set today Pt will Go Up / Down Stairs: Flight;with modified independence;with rail(s) PT Goal: Up/Down Stairs - Progress: Goal set today Pt will Perform Home Exercise Program: Independently PT Goal: Perform Home Exercise Program - Progress: Goal set today  PT Evaluation Precautions/Restrictions  Precautions Precautions: Fall Required Braces or Orthoses: No Restrictions Weight Bearing Restrictions: No Prior Functioning  Home Living Lives With: Spouse Receives Help From: Family (daughter in [place] helps out) Type of Home: House Home Layout: Multi-level Alternate Level Stairs-Rails: Left Alternate Level Stairs-Number of Steps: 12 Bathroom Shower/Tub: Walk-in shower;Tub/shower unit Bathroom Accessibility: Yes How Accessible: Accessible via walker Home Adaptive Equipment: Dan Humphreys - four wheeled Additional Comments: Pt's daughter in [place] is w/c bound but often helps with education and cognitive tasks Prior Function Level of Independence: Independent with basic ADLs;Independent with gait;Independent with transfers;Needs assistance with homemaking (uses RW only for out of house mobility) Vacuuming: Total (has someone who comes to clean house) Light Housekeeping: Total (has someone that comes to clean house) Able to Take Stairs?: Yes Driving: Yes Vocation: Retired Financial risk analyst Arousal/Alertness: Awake/alert Overall Cognitive Status: Appears within functional limits for tasks assessed Orientation Level: Oriented X4 Cognition - Other Comments: pt reports mild STM loss and slower processing of complex instructions (for example, pt encouraged to have daughter present when oxygen is brought to house and set up explained, as he has not had it  before) Sensation/Coordination Sensation Light Touch: Appears Intact Stereognosis: Not tested Hot/Cold: Not tested Proprioception: Appears Intact Coordination Gross Motor Movements are Fluid and Coordinated: Yes Fine Motor Movements are Fluid and Coordinated: Yes Extremity Assessment RUE Assessment RUE Assessment: Within Functional Limits LUE Assessment LUE Assessment: Within Functional Limits RLE Assessment RLE Assessment: Exceptions to [facility] RLE Strength RLE Overall Strength Comments: generalized wekaness noted with increased fatigue of LE's with increased ambulation distance LLE Assessment LLE Assessment: Exceptions to [facility] LLE Strength LLE Overall Strength Comments: generalized weakness noted, see note for RLE Mobility (including Balance) Bed Mobility Bed Mobility: Yes Supine to Sit: 6: Modified independent (Device/Increase time);HOB elevated (Comment degrees) (40) Sit to Supine: 6: Modified independent (Device/Increase time);HOB elevated (comment degrees) (40) Transfers Transfers: Yes Sit to Stand: 5: Supervision;With upper extremity assist;From bed Sit to Stand Details (indicate cue type and reason): pt demonstrates safe use of RW with sit to stand and stand to sit Stand to Sit: 6: Modified independent (Device/Increase time) Ambulation/Gait Ambulation/Gait: Yes Ambulation/Gait Assistance: 5: Supervision Ambulation/Gait Assistance Details (indicate cue type and reason): vc's for upright posture and self-monitoring of breathing.  Pt ambulated on RA and O2 sats decreased to low 90's with exertion, improved to 95% with rest.    Ambulation Distance (Feet): 150 Feet Assistive device: 4-wheeled walker Gait Pattern: Step-through pattern;Trunk flexed;Shuffle Gait velocity: decreased Stairs: Yes Stairs Assistance: 4: Min assist (min-guard) Stairs Assistance Details (indicate cue type and reason): pt required seated rest break before and after stairs.  Discussed possibility of pt  staying on ground floor bedroom to decrease use  of stairs upon initial d/c, for energy conservation Stair Management Technique: Step to pattern;Two rails;Forwards Number of Stairs: 12  Height of Stairs: 6  Wheelchair Mobility Wheelchair Mobility: No  Posture/Postural Control Posture/Postural Control: Postural limitations Postural Limitations: fwd flexed posture due to spinal stenosis Balance Balance Assessed: Yes Dynamic Standing Balance Dynamic Standing - Balance Support: Bilateral upper extremity supported Dynamic Standing - Level of Assistance: 5: Stand by assistance    End of Session PT - End of Session Equipment Utilized During Treatment: Gait belt Activity Tolerance: Patient limited by fatigue;Patient limited by pain Patient left: in bed;with call bell in reach Nurse Communication: Mobility status for ambulation General Behavior During Session: [facility] for tasks performed Cognition: [facility] for tasks performed  Marabelle Cushman, [place]  [phone] 09/01/2011, 1:38 PM

## 2011-09-01 NOTE — Progress Notes (Signed)
09/01/2011 [facility], Bosie Clos SPARKS Case Management Note [phone]    CARE MANAGEMENT NOTE 09/01/2011  Patient:  Marco Gregory, Marco Gregory   Account Number:  [id number]  Date Initiated:  09/01/2011  Documentation initiated by:  Fransico Michael  Subjective/Objective Assessment:   admitted on 08/28/11 with heartburn and shortness of breath     Action/Plan:   prior to admission, patient lived at home with spouse and was independent with ADLs.   Anticipated DC Date:  09/01/2011   Anticipated DC Plan:  HOME W HOME HEALTH SERVICES      DC Planning Services  CM consult      PAC Choice  DURABLE MEDICAL EQUIPMENT  HOME HEALTH   Choice offered to / List presented to:  C-1 Patient   DME arranged  OXYGEN      DME agency  [facility]     [facility] arranged  HH-1 RN  HH-4 NURSE'S AIDE      HH agency  [facility]   Status of service:  Completed, signed off Medicare Important Message given?   (If response is "NO", the following Medicare IM given date fields will be blank) Date Medicare IM given:   Date Additional Medicare IM given:    Discharge Disposition:  HOME W HOME HEALTH SERVICES  Per UR Regulation:  Reviewed for med. necessity/level of care/duration of stay  Comments:  09/01/11-1326-Marco Alaniz,RN,BSN  [phone]      78yo male patient admitted with heartburn and shortness of breath. Newly diagnosed with CHF. Prior to admission, patient lived at home with spouse and was independent with ADLS. In to speak with patient regarding recommendations for home health services. Choice of facilities given to patient. [facility]. Yvone Neu and Derrian with [facility] notified of referral. Patient being discharged home today.

## 2011-09-01 NOTE — Discharge Summary (Signed)
HOSPITAL DISCHARGE SUMMARY  Marco Gregory  MRN: [id number]  DOB:Jul 19, 1921  Date of Admission: 08/28/2011 Date of Discharge: 09/01/2011         LOS: 4 days   Attending Physician:Josanna Hefel A  Patient's QIO:NGEXBM Patty Sermons, MD, MD  Consults: Treatment Team:  Rounding [facility], MD Kandis Cocking, MD   Discharge Diagnoses: Present on Admission:  .ASTHMATIC BRONCHITIS, ACUTE .Atrial fibrillation .Hypotension-orthostatic .PACEMAKER, PERMANENT .SOB (shortness of breath) .Chest pain   Medication List  As of 09/01/2011 10:41 AM   TAKE these medications         acetaminophen 500 MG tablet   Commonly known as: TYLENOL   Take 500-1,000 mg by mouth every 6 (six) hours as needed. For pain      Chlorhexidine Gluconate Cloth 2 % Pads   Apply 6 each topically daily. To cleanse arms, legs, abdomen and back (except incision site) for 4 days starting yesterday 08/27/11      dorzolamide-timolol 22.3-6.8 MG/ML ophthalmic solution   Commonly known as: COSOPT   Place 1 drop into both eyes daily.      finasteride 5 MG tablet   Commonly known as: PROSCAR   Take 5 mg by mouth daily.      fludrocortisone 0.1 MG tablet   Commonly known as: FLORINEF   Take 0.1 mg by mouth daily.      Guaifenesin 1200 MG Tb12   Take 1 tablet (1,200 mg total) by mouth 2 (two) times daily.      HYDROcodone-acetaminophen 5-325 MG per tablet   Commonly known as: NORCO   Take 1-2 tablets by mouth every 6 (six) hours as needed. Not yet picked up      levothyroxine 175 MCG tablet   Commonly known as: SYNTHROID, LEVOTHROID   Take 175 mcg by mouth daily.      midodrine 10 MG tablet   Commonly known as: PROAMATINE   Take 5 mg by mouth 3 (three) times daily. Take 1/2 tablet by mouth at 8am, 12pm, and 4pm      moxifloxacin 400 MG tablet   Commonly known as: AVELOX   Take 1 tablet (400 mg total) by mouth daily at 6 PM.      mupirocin ointment 2 %   Commonly known as: BACTROBAN   Apply 1  application topically 2 (two) times daily. Apply to both nostrils for 4 days - starting yesterday 08/27/11      pyridostigmine 60 MG tablet   Commonly known as: MESTINON   Take 60 mg by mouth 2 (two) times daily. Take one tablet every morning & one tablet at lunch.      silodosin 8 MG Caps capsule   Commonly known as: RAPAFLO   Take 8 mg by mouth daily with lunch.      warfarin 5 MG tablet   Commonly known as: COUMADIN   Take 2.5 mg by mouth daily. Start back on Sunday 1/20 - 1/2 tablet daily - contact coumadin clinic for blood level check in 1-2 weeks             Brief Admission History: Patient is a 78 year old Caucasian gentleman, with a long-standing history of atrial fibrillation on chronic Coumadin therapy which is currently on hold as the patient was just discharged from this hospital yesterday following a inguinal hernia repair, patient has a long-standing history of orthostatic hypotension who came back to the hospital today for the above-noted complaints. The patient after the being discharged he was relatively okay, however around  3 AM this morning patient woke up with heartburn and had a very bitter taste in his mouth. He some times short of breath, "I was wheezing". His family members and noticed him to have audible wheezing when due having breakfast this morning. Patient was also complaining of more tiredness and having shortness of breath on exertion-urine walking to the bathroom. He then presented to the hospital for further evaluation and treatment.The hospitalist service has not been asked to admit this patient for further evaluation and treatment.  -He does have a history of sleep apnea, but has not been using his CPAP for the past 3 years.  Hospital Course: Present on Admission:  .ASTHMATIC BRONCHITIS, ACUTE .Atrial fibrillation .Hypotension-orthostatic .PACEMAKER, PERMANENT .SOB (shortness of breath) .Chest pain  1. Acute bronchitis: Patient came in with shortness  of breath or wheezing. No chest x-ray findings suggesting pneumonia. Admitted to the hospital started on antibiotics, mucolytics and supplemental oxygen as needed. Patient improved and his wheezing resolved. Patient to continue on his antibiotics for total of 7 days of antibiotics. Continue also the mucolytics. Patient to followup with Dr. Patty Sermons. Patient had saturation went down to 70s with ambulation. Patient is going to need oxygen at home.  2. Hypotension, orthostatic: This is been chronic problem the patient is on Florinef and midodrine for it. No changes were done to his medications.  3. Acute CHF, Diastolic: This is a questionable mild diastolic CHF. Patient echocardiogram showed LVEF of 58% and no mention about the diastolic function. Patient did not tolerate Lasix because of orthostatic hypotension..  4. Atrial fibrillation: This is being rate controlled, patient is on Coumadin. We'll discharge his INR is 1.23. He is to followup with Dr. Patty Sermons anti-coagulation clinic.  5. Chest pain: Negative cardiac enzymes and EKG. Likely GI in origin the patient PPI was continued.  6. Status post inguinal hernia repair recently: [facility] evaluated the patient. No complications.   Day of Discharge BP 101/55  Pulse 86  Temp(Src) 97.3 F (36.3 C) (Oral)  Resp 24  Ht 6\' 3"  (1.905 m)  Wt 93.7 kg (206 lb 9.1 oz)  BMI 25.82 kg/m2  SpO2 97% Physical Exam: GEN: No acute distress, cooperative with exam PSYCH: He is alert and oriented x4; does not appear anxious does not appear depressed; affect is normal  HEENT: Mucous membranes pink and anicteric;  Mouth: without oral thrush or lesions Eyes: PERRLA; EOM intact;  Neck: no cervical lymphadenopathy nor thyromegaly or carotid bruit; no JVD;  CHEST WALL: No tenderness, symmetrical to breathing bilaterally CHEST: Normal respiration, clear to auscultation bilaterally  HEART: Regular rate and rhythm; no murmurs, rubs or gallops, S1  and S2 heard  BACK: No kyphosis or scoliosis; no CVA tenderness  ABDOMEN:  soft non-tender; no masses, no organomegaly, normal abdominal bowel sounds; no pannus; no intertriginous candida.  EXTREMITIES: No bone or joint deformity; no edema; no ulcerations.  PULSES: 2+ and symmetric, neurovascularity is intact SKIN: Normal hydration no rash or ulceration, no flushing or suspicious lesions  CNS: Cranial nerves 2-12 grossly intact no focal neurologic deficit, coordination is intact gait not tested    Results for orders placed during the hospital encounter of 08/28/11 (from the past 24 hour(s))  PROTIME-INR     Status: Abnormal   Collection Time   09/01/11  6:20 AM      Component Value Range   Prothrombin Time 15.8 (*) 11.6 - 15.2 (seconds)   INR 1.23  0.00 - 1.49  Disposition: Home with home health services   Follow-up Appts: Discharge Orders    Future Appointments: Provider: Department: Dept Phone: Center:   09/22/2011 1:45 PM Tiffany Daneil Dan, RN [facility] [phone] None     Future Orders Please Complete By Expires   Diet - low sodium heart healthy      Increase activity slowly         Follow-up Information    Follow up with Cassell Clement, MD in 1 week.         I spent 40 minutes completing paperwork and coordinating discharge efforts.  SignedClydia Llano A 09/01/2011, 10:41 AM

## 2011-09-01 NOTE — Progress Notes (Signed)
Pt o2 saturation on RA at rest is 90.  Ambulated in the hallway o2 sats dropped to 76%. Applied o2 back to pt while walking and o2 sats went up to 92% with 2L of o2

## 2011-09-01 NOTE — Progress Notes (Signed)
ANTICOAGULATION CONSULT NOTE - Initial Consult  Pharmacy Consult for warfarin  Indication: atrial fibrillation  No Known Allergies  Vital Signs: Temp: 97.3 F (36.3 C) (01/23 0437) BP: 101/55 mmHg (01/23 0437) Pulse Rate: 86  (01/23 0437)  Labs:  Alvira Philips 09/01/11 0620 08/31/11 0507 08/30/11 0515 08/29/11 1611  HGB -- 13.6 -- --  HCT -- 41.2 -- --  PLT -- 166 -- --  APTT -- -- -- --  LABPROT 15.8* 15.0 15.3* --  INR 1.23 1.16 1.18 --  HEPARINUNFRC -- -- -- --  CREATININE -- 0.93 0.85 --  CKTOTAL -- -- -- 32  CKMB -- -- -- 2.4  TROPONINI -- -- -- <0.30   Estimated Creatinine Clearance: 65.6 ml/min (by C-G formula based on Cr of 0.93).  Medical History: Past Medical History  Diagnosis Date  . Atrial fibrillation   . Long-term (current) use of anticoagulants   . Glaucoma   . Gout   . Hyperthyroidism   . Orthostatic hypotension   . Redundant prepuce and phimosis   . Acute cystitis   . Hypertonicity of bladder   . Bradycardia        . HTN (hypertension)   . Atelectasis   . Dysautonomia   . Pacemaker     St. Jude; device generator replacement 2012  . CHF (congestive heart failure)     Previous preserved EF.  [name] Sleep apnea     does not use cpap not in 4 yrs  . Chronic kidney disease [facility]     UTI  1 MO AGO [place] MEDICALLY     Medications:  Prescriptions prior to admission  Medication Sig Dispense Refill  . acetaminophen (TYLENOL) 500 MG tablet Take 500-1,000 mg by mouth every 6 (six) hours as needed. For pain      . Chlorhexidine Gluconate Cloth 2 % PADS Apply 6 each topically daily. To cleanse arms, legs, abdomen and back (except incision site) for 4 days starting yesterday 08/27/11      . dorzolamide-timolol (COSOPT) 22.3-6.8 MG/ML ophthalmic solution Place 1 drop into both eyes daily.       . finasteride (PROSCAR) 5 MG tablet Take 5 mg by mouth daily.       . fludrocortisone (FLORINEF) 0.1 MG tablet Take 0.1 mg by mouth daily.       [name] levothyroxine  (SYNTHROID, LEVOTHROID) 175 MCG tablet Take 175 mcg by mouth daily.       . midodrine (PROAMATINE) 10 MG tablet Take 5 mg by mouth 3 (three) times daily. Take 1/2 tablet by mouth at 8am, 12pm, and 4pm      . mupirocin ointment (BACTROBAN) 2 % Apply 1 application topically 2 (two) times daily. Apply to both nostrils for 4 days - starting yesterday 08/27/11      . pyridostigmine (MESTINON) 60 MG tablet Take 60 mg by mouth 2 (two) times daily. Take one tablet every morning & one tablet at lunch.      . silodosin (RAPAFLO) 8 MG CAPS capsule Take 8 mg by mouth daily with lunch.      . warfarin (COUMADIN) 5 MG tablet Take 2.5 mg by mouth daily. Start back on Sunday 1/20 - 1/2 tablet daily - contact coumadin clinic for blood level check in 1-2 weeks      . HYDROcodone-acetaminophen (NORCO) 5-325 MG per tablet Take 1-2 tablets by mouth every 6 (six) hours as needed. Not yet picked up        Assessment: 89 yom  admitted with heartburn and shortness of breath.  He was discharged 1/19 from the hospital s/p inguinal hernia repair.  INR is subtherapeutic.  Home dose of coumadin is 2.5mg  daily.    Goal of Therapy:  INR 2-3   Plan:  1.Coumadin 5mg  today x 1 dose. 2. Continue lovenox 40mg  SQ qday til INR>2 3. Daily PT/INR   Elwin Sleight 09/01/2011,9:31 AM

## 2011-09-01 NOTE — Progress Notes (Signed)
Subjective:  The patient states he feels better this am.  He is anxious to go home.  Afebrile.  BP is back to baseline for him, on midodrine and florinef.  Chest CT scan yesterday negative.  Objective:  Vital Signs in the last 24 hours: Temp:  [97.3 F (36.3 C)-97.8 F (36.6 C)] 97.3 F (36.3 C) (01/23 0437) Pulse Rate:  [69-87] 86  (01/23 0437) Resp:  [18-24] 24  (01/23 0437) BP: (101-125)/(55-69) 101/55 mmHg (01/23 0437) SpO2:  [90 %-97 %] 97 % (01/23 0750) Weight:  [206 lb 9.1 oz (93.7 kg)] 206 lb 9.1 oz (93.7 kg) (01/23 0437)  Intake/Output from previous day: 01/22 0701 - 01/23 0700 In: 920 [P.O.:920] Out: 1775 [Urine:1775] Intake/Output from this shift:       . Chlorhexidine Gluconate Cloth  6 each Topical Daily  . dorzolamide  1 drop Both Eyes TID  . enoxaparin  40 mg Subcutaneous Q24H  . finasteride  5 mg Oral Daily  . fludrocortisone  0.1 mg Oral Daily  . ipratropium  0.5 mg Nebulization TID  . levalbuterol  0.63 mg Nebulization TID  . levothyroxine  175 mcg Oral QAC breakfast  . midodrine  5 mg Oral TID WC  . moxifloxacin  400 mg Oral q1800  . mupirocin ointment  1 application Topical BID  . pantoprazole  40 mg Oral Q1200  . pyridostigmine  60 mg Oral BID  . silodosin  8 mg Oral Q lunch  . timolol  1 drop Both Eyes Daily  . warfarin  5 mg Oral ONCE-1800  . DISCONTD: moxifloxacin  400 mg Intravenous Q24H      Physical Exam: The patient appears to be in no distress.  Head and neck exam reveals that the pupils are equal and reactive.  The extraocular movements are full.  There is no scleral icterus.  Mouth and pharynx are benign.  No lymphadenopathy.  No carotid bruits.  The jugular venous pressure is normal.  Thyroid is not enlarged or tender.  Chest:  Bilateral rhonchi but no respiratory distress.  Heart reveals no abnormal lift or heave.  First and second heart sounds are normal.  There is no murmur gallop rub or click.  The abdomen is soft and  nontender.  Bowel sounds are normoactive.  There is no hepatosplenomegaly or mass.  There are no abdominal bruits.  Extremities reveal no phlebitis or edema.  Pedal pulses are good.  There is no cyanosis or clubbing.  Neurologic exam is normal strength and no lateralizing weakness.  No sensory deficits.  Integument reveals no rash  Lab Results:  Basename 08/31/11 0507  WBC 6.4  HGB 13.6  PLT 166    Basename 08/31/11 0507 08/30/11 0515  NA 138 136  K 4.0 4.0  CL 99 100  CO2 30 28  GLUCOSE 111* 102*  BUN 19 18  CREATININE 0.93 0.85    Basename 08/29/11 1611  TROPONINI <0.30   Hepatic Function Panel No results found for this basename: PROT,ALBUMIN,AST,ALT,ALKPHOS,BILITOT,BILIDIR,IBILI in the last 72 hours No results found for this basename: CHOL in the last 72 hours No results found for this basename: PROTIME in the last 72 hours  Imaging: Imaging results have been reviewed  Cardiac Studies: Atrial fib with V-Pacing Assessment/Plan:   He appears to be back to baseline at this point. Will add Mucinex to help with expectoration. Oxygen sats drop into the 70s with walking so he may benefit from home O2. Can consider discharge today  with followup in my office.  Continue re-coumadinization with followup in  coumadin clinic.   LOS: 4 days    Cassell Clement 09/01/2011, 8:30 AM

## 2011-09-01 NOTE — Progress Notes (Signed)
Pt and family given dc instructions. Pt and family verbalized understanding. Pt dc home via wc.

## 2011-09-03 ENCOUNTER — Telehealth (INDEPENDENT_AMBULATORY_CARE_PROVIDER_SITE_OTHER): Payer: Self-pay | Admitting: Surgery

## 2011-09-03 ENCOUNTER — Encounter: Payer: Self-pay | Admitting: Cardiology

## 2011-09-03 NOTE — Telephone Encounter (Signed)
error 

## 2011-09-06 NOTE — Telephone Encounter (Signed)
I called the patient and gave him an appointment for 2/6 at 12pm.

## 2011-09-13 ENCOUNTER — Encounter: Payer: Self-pay | Admitting: Cardiology

## 2011-09-13 ENCOUNTER — Ambulatory Visit (INDEPENDENT_AMBULATORY_CARE_PROVIDER_SITE_OTHER): Payer: Medicare Other | Admitting: Cardiology

## 2011-09-13 VITALS — BP 110/80 | HR 70 | Ht 75.0 in | Wt 216.0 lb

## 2011-09-13 DIAGNOSIS — Q078 Other specified congenital malformations of nervous system: Secondary | ICD-10-CM

## 2011-09-13 DIAGNOSIS — I4891 Unspecified atrial fibrillation: Secondary | ICD-10-CM

## 2011-09-13 DIAGNOSIS — I951 Orthostatic hypotension: Secondary | ICD-10-CM

## 2011-09-13 DIAGNOSIS — J449 Chronic obstructive pulmonary disease, unspecified: Secondary | ICD-10-CM

## 2011-09-13 DIAGNOSIS — J4489 Other specified chronic obstructive pulmonary disease: Secondary | ICD-10-CM

## 2011-09-13 DIAGNOSIS — R079 Chest pain, unspecified: Secondary | ICD-10-CM

## 2011-09-13 NOTE — Assessment & Plan Note (Signed)
The patient remains in atrial fibrillation.  He does have an underlying ventricular pacemaker.  He is back on his Coumadin.  He is scheduled for another protime next week.  He has not been having any TIA symptoms

## 2011-09-13 NOTE — Assessment & Plan Note (Signed)
The patient has not been having any severe symptoms of orthostatic hypotension recently.  He remains on midodrine 5 mg 3 times a day and Mestinon 60 mg twice a day.

## 2011-09-13 NOTE — Patient Instructions (Signed)
Your physician recommends that you continue on your current medications as directed. Please refer to the Current Medication list given to you today.  Your physician recommends that you schedule a follow-up appointment in: 2 months 

## 2011-09-13 NOTE — Assessment & Plan Note (Signed)
The patient has not been expressing any chest pain 

## 2011-09-13 NOTE — Progress Notes (Signed)
Inez Pilgrim Date of Birth:  01/04/1921 [facility] [address] [place], [place]  [address] [phone]         Fax   [phone]  History of Present Illness: This pleasant 78 year old gentleman is seen for a postoperative office visit.  He was recently hospitalized after left inguinal hernia surgery.  He had successful surgery went home and then was readmitted in respiratory distress.  He was hospitalized at [facility] on the hospitalist service.  He had evidence of COPD with possible pneumonia as well as evidence of fluid overload by chest x-ray.  He responded nicely to treatment with diuretics and with antibiotics and mucolytics.  He was sent home on nasal oxygen which she is using some of the time.  He has felt well since getting home.  His breathing has improved.  He said no chest pain.  He has some residual chest congestion and mucus and he plans to refill his  Mucinex.  Current Outpatient Prescriptions  Medication Sig Dispense Refill  . acetaminophen (TYLENOL) 500 MG tablet Take 500-1,000 mg by mouth every 6 (six) hours as needed. For pain      . dorzolamide-timolol (COSOPT) 22.3-6.8 MG/ML ophthalmic solution Place 1 drop into both eyes daily.       . finasteride (PROSCAR) 5 MG tablet Take 5 mg by mouth daily.       . fludrocortisone (FLORINEF) 0.1 MG tablet Take 0.1 mg by mouth daily.       . Guaifenesin (MUCINEX MAXIMUM STRENGTH) 1200 MG TB12 Take 1 tablet (1,200 mg total) by mouth 2 (two) times daily.  14 each  0  . levothyroxine (SYNTHROID, LEVOTHROID) 175 MCG tablet Take 175 mcg by mouth daily.       . midodrine (PROAMATINE) 10 MG tablet Take 5 mg by mouth 3 (three) times daily. Take 1/2 tablet by mouth at 8am, 12pm, and 4pm      . pyridostigmine (MESTINON) 60 MG tablet Take 60 mg by mouth 2 (two) times daily. Take one tablet every morning & one tablet at lunch.      . silodosin (RAPAFLO) 8 MG CAPS capsule Take 8 mg by mouth daily with  lunch.      . warfarin (COUMADIN) 5 MG tablet Take 2.5 mg by mouth daily. Start back on Sunday 1/20 - 1/2 tablet daily - contact coumadin clinic for blood level check in 1-2 weeks        No Known Allergies  Patient Active Problem List  Diagnoses  . OBSTRUCTIVE SLEEP APNEA  . Hypotension-orthostatic  . Atrial fibrillation  . SICK SINUS/ TACHY-BRADY SYNDROME  . ASTHMATIC BRONCHITIS, ACUTE  . ATELECTASIS  . DYSAUTONOMIA  . PACEMAKER, PERMANENT  . Overactive bladder  . Hoarseness of voice  . Hypertension  . SOB (shortness of breath)  . Chest pain  . Status post inguinal hernia repair    History  Smoking status  . Former Smoker  . Quit date: 08/09/1965  Smokeless tobacco  . Never Used    History  Alcohol Use No    Family History  Problem Relation Age of Onset  . Cancer Mother 46    GYN    Review of Systems: Constitutional: no fever chills diaphoresis or fatigue or change in weight.  Head and neck: no hearing loss, no epistaxis, no photophobia or visual disturbance. Respiratory: No cough, shortness of breath or wheezing. Cardiovascular: No chest pain peripheral edema, palpitations. Gastrointestinal: No abdominal distention, no abdominal pain,  no change in bowel habits hematochezia or melena. Genitourinary: No dysuria, no frequency, no urgency, no nocturia. Musculoskeletal:No arthralgias, no back pain, no gait disturbance or myalgias. Neurological: No dizziness, no headaches, no numbness, no seizures, no syncope, no weakness, no tremors. Hematologic: No lymphadenopathy, no easy bruising. Psychiatric: No confusion, no hallucinations, no sleep disturbance.    Physical Exam: Filed Vitals:   09/13/11 1416  BP: 110/80  Pulse: 70   The general appearance reveals an elderly gentleman in no acute distress.Pupils equal and reactive.   Extraocular Movements are full.  There is no scleral icterus.  The mouth and pharynx are normal.  The neck is supple.  The carotids reveal  no bruits.  The jugular venous pressure is normal.  The thyroid is not enlarged.  There is no lymphadenopathy.  Chest revealed scattered mild rhonchi.The precordium is quiet.  The first heart sound is normal.  The second heart sound is physiologically split.  There is no murmur gallop rub or click.  There is no abnormal lift or heave.  Rhythm is irregularThe abdomen is soft and nontender. Bowel sounds are normal. The liver and spleen are not enlarged. There Are no abdominal masses. There are no bruits.  The inguinal hernia incision appears to be healing well.Strength is normal and symmetrical in all extremities.  There is no lateralizing weakness.  There are no sensory deficits.  The skin is warm and dry.  There is no rash.   Assessment / Plan: The patient is to continue same medication.  He will use his home oxygen as necessary.  Recheck in 2 months for a followup office visit and EKG

## 2011-09-15 ENCOUNTER — Encounter (INDEPENDENT_AMBULATORY_CARE_PROVIDER_SITE_OTHER): Payer: Self-pay | Admitting: Surgery

## 2011-09-15 ENCOUNTER — Ambulatory Visit (INDEPENDENT_AMBULATORY_CARE_PROVIDER_SITE_OTHER): Payer: Medicare Other | Admitting: Surgery

## 2011-09-15 VITALS — BP 152/94 | HR 88 | Temp 97.2°F | Resp 18 | Ht 75.0 in | Wt 217.6 lb

## 2011-09-15 DIAGNOSIS — Z9889 Other specified postprocedural states: Secondary | ICD-10-CM

## 2011-09-15 DIAGNOSIS — Z8719 Personal history of other diseases of the digestive system: Secondary | ICD-10-CM

## 2011-09-15 NOTE — Progress Notes (Signed)
Re:   Marco Gregory DOB:   1973-03-31 MRN:   [id number]  ASSESSMENT AND PLAN: 1.  Left inguinal hernia, large.  Repaired 08/26/2011.  Had to be readmitted for acute bronchitis.  Doing bette now, though on O2 at home at night.  Has seroma at incision, but this should resolve.  Return to office PRN.  I told him if the seroma does not resolve in 6 months, I would be happy to check it.  2.  Sleep apnea.  Has CPAP, but does not wear it. 3.  Anticoagulated.  On coumadin. 4.  Bladder problems. Was followed by Dr. Aldean Gregory.  Now followed by Dr. Fernande Gregory. 5.  Pacemaker.  Generator replaced Sept 2012 by Dr. Odessa Gregory. 6.  Hypertension. 7.  Paroxysmal atrial fibrillation. 8.  Spinal stenosis.  Followed by Marco Gregory.  Injections have not helped symptoms. 9.  Dizziness.  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  REFERRING PHYSICIAN: Cassell Clement, MD, MD  HISTORY OF PRESENT ILLNESS: Marco Gregory is a 78 y.o. (DOB: April 20, 1921)  white male whose primary care physician is Marco Clement, MD, MD and comes to me today for follow up of left inguinal hernia repair.  He did well with the surgery.  But had to be readmitted to [facility] from 1/19 - 09/01/2011 for acute bronchitis.  He is doing much better now.  The patient had a right inguinal hernia repaired by Dr. Francina Gregory in 2000.   Past Medical History  Diagnosis Date  . Atrial fibrillation   . Long-term (current) use of anticoagulants   . Glaucoma   . Gout   . Hyperthyroidism   . Orthostatic hypotension   . Redundant prepuce and phimosis   . Acute cystitis   . Hypertonicity of bladder   . Bradycardia        . HTN (hypertension)   . Atelectasis   . Dysautonomia   . Pacemaker     [employer]; device generator replacement 2012  . CHF (congestive heart failure)     Previous preserved EF.  [name] Sleep apnea     does not use cpap not  in 4 yrs  . Chronic kidney disease [facility]     UTI  1 MO AGO TX MEDICALLY      Current Outpatient Prescriptions  Medication Sig Dispense Refill  . acetaminophen (TYLENOL) 500 MG tablet Take 500-1,000 mg by mouth every 6 (six) hours as needed. For pain      . dorzolamide-timolol (COSOPT) 22.3-6.8 MG/ML ophthalmic solution Place 1 drop into both eyes daily.       . finasteride (PROSCAR) 5 MG tablet Take 5 mg by mouth daily.       . fludrocortisone (FLORINEF) 0.1 MG tablet Take 0.1 mg by mouth daily.       . Guaifenesin (MUCINEX MAXIMUM STRENGTH) 1200 MG TB12 Take 1 tablet (1,200 mg total) by mouth 2 (two) times daily.  14 each  0  . levothyroxine (SYNTHROID, LEVOTHROID) 175 MCG tablet Take 175 mcg by mouth daily.       . midodrine (PROAMATINE) 10 MG tablet Take 5 mg by mouth 3 (three) times daily. Take 1/2 tablet by mouth at 8am, 12pm, and 4pm      . pyridostigmine (MESTINON) 60 MG tablet Take 60 mg by mouth 2 (two) times daily. Take one tablet every morning & one tablet at lunch.      [name]  silodosin (RAPAFLO) 8 MG CAPS capsule Take 8 mg by mouth daily with lunch.      . warfarin (COUMADIN) 5 MG tablet Take 2.5 mg by mouth daily. Start back on Sunday 1/20 - 1/2 tablet daily - contact coumadin clinic for blood level check in 1-2 weeks         No Known Allergies  REVIEW OF SYSTEMS: Cardiac:  Pacemaker followed by Dr. Odessa Gregory.  Dr. Wylene Gregory follows him for his heart also.  Has paroxysmal A. Fib.  On coumadin for this. Pulmonary:  Quit smoking 1967.  No asthma or bronchitis.  No OSA/CPAP.  Endocrine:  No diabetes. Had radioactive iodine in 1970 at [facility], now on thyroid replacement. Gastrointestinal:  See HPI. Urologic:  Has bladder issues.  Symptoms improved with Rapiflow. Musculoskeletal:  Spinal stenosis.  Followed by Drs. Marco Gregory.  Uses a cane for stability. Hematologic:  Anticoagulated, on coumadin.  SOCIAL and FAMILY HISTORY: Wife, Marco Gregory, with patient.  PHYSICAL  EXAM: BP 152/94  Pulse 88  Temp(Src) 97.2 F (36.2 C) (Temporal)  Resp 18  Ht 6\' 3"  (1.905 m)  Wt 217 lb 9.6 oz (98.703 kg)  BMI 27.20 kg/m2  General: WN older WM who is alert and generally healthy appearing. Lungs: Clear to auscultation and symmetric breath sounds. Heart:  RRR. No murmur or rub.  Has left upper chest pacemaker.  Abdomen: Soft. No mass. Left groin incision doing okay.  He has a 3 x 5 cm seroma at the incision.    DATA REVIEWED: Path report given to patient.  Ovidio Kin, MD,  [facility] [address] [place]  [address]   [place], [place]    [address] Phone:  [phone] FAX:  [phone]

## 2011-09-17 ENCOUNTER — Telehealth: Payer: Self-pay | Admitting: Cardiology

## 2011-09-17 NOTE — Telephone Encounter (Signed)
New Problem   [facility] home care  [phone]  Called to report patient info:   BP 158/98, a systematic  Joyce Gross will see patient again next when she will call with another BP reading.

## 2011-09-17 NOTE — Telephone Encounter (Signed)
Advised patient

## 2011-09-17 NOTE — Telephone Encounter (Signed)
Decrease florinef to QOD

## 2011-09-17 NOTE — Telephone Encounter (Signed)
Patient still taking Midodrine and Florinef Will forward to  Dr. Patty Sermons for review

## 2011-09-22 ENCOUNTER — Ambulatory Visit (INDEPENDENT_AMBULATORY_CARE_PROVIDER_SITE_OTHER): Payer: Medicare Other | Admitting: *Deleted

## 2011-09-22 DIAGNOSIS — I4891 Unspecified atrial fibrillation: Secondary | ICD-10-CM

## 2011-09-22 LAB — POCT INR: INR: 2.4

## 2011-10-06 ENCOUNTER — Ambulatory Visit (INDEPENDENT_AMBULATORY_CARE_PROVIDER_SITE_OTHER): Payer: Medicare Other | Admitting: Pharmacist

## 2011-10-06 DIAGNOSIS — I4891 Unspecified atrial fibrillation: Secondary | ICD-10-CM

## 2011-10-06 LAB — POCT INR: INR: 2.4

## 2011-10-13 ENCOUNTER — Telehealth: Payer: Self-pay | Admitting: Cardiology

## 2011-10-13 MED ORDER — MOXIFLOXACIN HCL 400 MG PO TABS: 400.0000 mg | ORAL_TABLET | Freq: Every day | ORAL | Status: AC

## 2011-10-13 NOTE — Telephone Encounter (Signed)
1)  Patient was not discharged home on the digoxin however the nurse went over medications today and he has been taking.  This was her next to last visit and had gone over medications previously and he didn't say he was taking.  After discussing with him, he actually has been taking his digoxin daily since coming home.   2)  Also patient has been getting up thick colored mucus and he thinks sinus problems.  Did clear up after he took the Avelox after coming home from the hospital and wanted to know if he could get something else (d/c from hospital 1/23)  Will forward to  Dr. Patty Sermons for review

## 2011-10-13 NOTE — Telephone Encounter (Signed)
Since he is doing well now on digoxin, continue it. For his thick sputum give another course of Avelox 400 mg daily for 5 days

## 2011-10-13 NOTE — Telephone Encounter (Signed)
[facility]- Home health nurse [phone]   Please return call to home health nurse to discuss RX Digoxin and patient sinus drainage.  She can be reached at the cell # above.

## 2011-10-13 NOTE — Telephone Encounter (Signed)
Left message at patients home  Will forward to  Dr. Patty Sermons about follow up INR.  Last INR 2/27 2.4, not scheduled for recheck until 3/28

## 2011-10-13 NOTE — Telephone Encounter (Signed)
No need to check INR for this short course of Avelox

## 2011-10-14 NOTE — Telephone Encounter (Signed)
Pt rtn call to melinda, pt aware she's not in today, requesting a call from her tomorrow

## 2011-10-15 NOTE — Telephone Encounter (Signed)
Spoke with patient, he actually already picked up Avelox.

## 2011-11-04 ENCOUNTER — Ambulatory Visit (INDEPENDENT_AMBULATORY_CARE_PROVIDER_SITE_OTHER): Payer: Medicare Other | Admitting: *Deleted

## 2011-11-04 DIAGNOSIS — I4891 Unspecified atrial fibrillation: Secondary | ICD-10-CM

## 2011-11-04 LAB — POCT INR: INR: 2.5

## 2011-11-11 ENCOUNTER — Ambulatory Visit (INDEPENDENT_AMBULATORY_CARE_PROVIDER_SITE_OTHER): Payer: Medicare Other | Admitting: Cardiology

## 2011-11-11 ENCOUNTER — Encounter: Payer: Self-pay | Admitting: Cardiology

## 2011-11-11 VITALS — BP 130/84 | HR 69 | Ht 75.0 in | Wt 221.0 lb

## 2011-11-11 DIAGNOSIS — I4891 Unspecified atrial fibrillation: Secondary | ICD-10-CM

## 2011-11-11 DIAGNOSIS — I951 Orthostatic hypotension: Secondary | ICD-10-CM

## 2011-11-11 DIAGNOSIS — G4733 Obstructive sleep apnea (adult) (pediatric): Secondary | ICD-10-CM

## 2011-11-11 DIAGNOSIS — Z950 Presence of cardiac pacemaker: Secondary | ICD-10-CM

## 2011-11-11 DIAGNOSIS — I959 Hypotension, unspecified: Secondary | ICD-10-CM

## 2011-11-11 DIAGNOSIS — M4800 Spinal stenosis, site unspecified: Secondary | ICD-10-CM

## 2011-11-11 MED ORDER — DIGOXIN 125 MCG PO TABS: 125.0000 ug | ORAL_TABLET | Freq: Every day | ORAL | Status: DC

## 2011-11-11 NOTE — Assessment & Plan Note (Signed)
The patient has had a good clinical response to Midrin and Mestinon and has not been having any severe orthostatic symptoms recently

## 2011-11-11 NOTE — Assessment & Plan Note (Signed)
The patient denies any difficulty sleeping.  He does wake up 2 or 3 times during the night to urinate.  He is able to fall right back to sleep

## 2011-11-11 NOTE — Assessment & Plan Note (Signed)
EKG today shows the patient is in atrial fibrillation with occasional ventricular paced beats seen.  The patient has not been experiencing any TIA symptoms.  He is on long-term warfarin.

## 2011-11-11 NOTE — Progress Notes (Signed)
Marco Gregory Date of Birth:  25-Jun-1921 [facility] [address] [place], [place]  [address] [phone]         Fax   [phone]  History of Present Illness: This pleasant 78 year old gentleman is seen for a four-month followup office visit.  He has a past history of dysautonomia with symptomatic orthostatic hypotension.  He also has a past history of sick sinus syndrome with tachybradycardia syndrome and has a functioning pacemaker.  He has had paroxysmal atrial fibrillation.  He has a history of COPD and was hospitalized earlier in the year with possible pneumonia following an uneventful left inguinal herniorrhaphy.  The patient has had a chronic cough to he has a past history of hypoxemia but recently his oxygen saturations at home have been in the range of 95-98% he has a history of chronic neck and back pain secondary to spinal stenosis.  Current Outpatient Prescriptions  Medication Sig Dispense Refill  . acetaminophen (TYLENOL) 500 MG tablet Take 500-1,000 mg by mouth every 6 (six) hours as needed. For pain      . digoxin (LANOXIN) 0.125 MG tablet Take 1 tablet (125 mcg total) by mouth daily.  90 tablet  3  . dorzolamide-timolol (COSOPT) 22.3-6.8 MG/ML ophthalmic solution Place 1 drop into both eyes daily.       . finasteride (PROSCAR) 5 MG tablet Take 5 mg by mouth daily.       . fludrocortisone (FLORINEF) 0.1 MG tablet Take 0.1 mg by mouth every other day.       . Guaifenesin (MUCINEX MAXIMUM STRENGTH) 1200 MG TB12 Take 1 tablet (1,200 mg total) by mouth 2 (two) times daily.  14 each  0  . levothyroxine (SYNTHROID, LEVOTHROID) 175 MCG tablet Take 175 mcg by mouth daily.       . midodrine (PROAMATINE) 10 MG tablet Take 5 mg by mouth 3 (three) times daily. Take 1/2 tablet by mouth at 8am, 12pm, and 4pm      . pyridostigmine (MESTINON) 60 MG tablet Take 60 mg by mouth 2 (two) times daily. Take one tablet every morning & one tablet at lunch.      .  silodosin (RAPAFLO) 8 MG CAPS capsule Take 8 mg by mouth daily with lunch.      . warfarin (COUMADIN) 5 MG tablet Take 2.5 mg by mouth daily. Start back on Sunday 1/20 - 1/2 tablet daily - contact coumadin clinic for blood level check in 1-2 weeks        No Known Allergies  Patient Active Problem List  Diagnoses  . OBSTRUCTIVE SLEEP APNEA  . Hypotension-orthostatic  . Atrial fibrillation  . SICK SINUS/ TACHY-BRADY SYNDROME  . ASTHMATIC BRONCHITIS, ACUTE  . ATELECTASIS  . DYSAUTONOMIA  . PACEMAKER, PERMANENT  . Overactive bladder  . Hoarseness of voice  . Hypertension  . SOB (shortness of breath)  . Chest pain  . Status post inguinal hernia repair, 08/26/2011.    History  Smoking status  . Former Smoker  . Quit date: 08/09/1965  Smokeless tobacco  . Never Used    History  Alcohol Use No    Family History  Problem Relation Age of Onset  . Cancer Mother 94    GYN    Review of Systems: Constitutional: no fever chills diaphoresis or fatigue or change in weight.  Head and neck: no hearing loss, no epistaxis, no photophobia or visual disturbance. Respiratory: No cough, shortness of breath or wheezing. Cardiovascular: No chest pain  peripheral edema, palpitations. Gastrointestinal: No abdominal distention, no abdominal pain, no change in bowel habits hematochezia or melena. Genitourinary: No dysuria, no frequency, no urgency, no nocturia. Musculoskeletal:No arthralgias, no back pain, no gait disturbance or myalgias. Neurological: No dizziness, no headaches, no numbness, no seizures, no syncope, no weakness, no tremors. Hematologic: No lymphadenopathy, no easy bruising. Psychiatric: No confusion, no hallucinations, no sleep disturbance.    Physical Exam: Filed Vitals:   11/11/11 1355  BP: 130/84  Pulse: 69   the general appearance reveals a well-developed well-nourished elderly gentleman in no distress.Pupils equal and reactive.   Extraocular Movements are full.   There is no scleral icterus.  The mouth and pharynx are normal.  The neck is supple.  The carotids reveal no bruits.  The jugular venous pressure is normal.  The thyroid is not enlarged.  There is no lymphadenopathy.  The chest is clear to percussion and auscultation. There are no rales or rhonchi. Expansion of the chest is symmetrical.  The heart is irregular without murmur gallop or rub.The abdomen is soft and nontender. Bowel sounds are normal. The liver and spleen are not enlarged. There Are no abdominal masses. There are no bruits.  The pedal pulses are good.  There is no phlebitis or edema.  There is no cyanosis or clubbing. Strength is normal and symmetrical in all extremities.  There is no lateralizing weakness.  There are no sensory deficits.  The skin is warm and dry.  There is no rash.  EKG today shows atrial fibrillation with frequent ventricular paced beats and he has a pattern of right bundle branch block and left anterior hemiblock   Assessment / Plan:  Continue same medication.  We refilled his digoxin today.  Recheck in 3 months

## 2011-11-11 NOTE — Patient Instructions (Signed)
Your physician recommends that you schedule a follow-up appointment in: 3 months with Dr. Brackbill.    

## 2011-11-22 ENCOUNTER — Ambulatory Visit: Payer: Medicare Other | Admitting: Cardiology

## 2011-11-23 ENCOUNTER — Other Ambulatory Visit: Payer: Self-pay | Admitting: Cardiology

## 2011-12-02 ENCOUNTER — Telehealth: Payer: Self-pay | Admitting: Physician Assistant

## 2011-12-02 NOTE — Telephone Encounter (Signed)
Marco Gregory called to report elevated blood pressure this evening, was 200/95 earlier. It has since come down to 175/95 without intervention. He feels fine, just "uneasy" about the uncertainty of what this blood pressure means. No CP, SOB, headache, visual changes or dizziness or anything else out of the ordinary. He usually has trouble with low blood pressure and is thus on midodrine and florinef. I advised him to relax, take it easy, recheck his BP later this evening and if it still coming down, to recheck his pressure in the morning and call our office if it is still high. He asked if he could take one of his wife's Toprol and I told him unfortunately we would not advise anyone to take another patient's medication - especially since per his report his pulse is usually 60. I told him if his BP continues to rise and he does not feel quite right he is welcome to proceed to the ER. He expressed gratitude and understanding. He has an appointment with Dr. Patty Sermons on Monday (4/29).  Imberly Troxler PA-C

## 2011-12-06 ENCOUNTER — Ambulatory Visit (INDEPENDENT_AMBULATORY_CARE_PROVIDER_SITE_OTHER): Payer: Medicare Other | Admitting: Pharmacist

## 2011-12-06 DIAGNOSIS — I4891 Unspecified atrial fibrillation: Secondary | ICD-10-CM

## 2011-12-06 LAB — POCT INR: INR: 2.2

## 2011-12-13 ENCOUNTER — Telehealth: Payer: Self-pay | Admitting: Cardiology

## 2011-12-13 NOTE — Telephone Encounter (Signed)
New msg Pt wants to talk to you. He wouldn't tell my why. Please call

## 2011-12-13 NOTE — Telephone Encounter (Signed)
Patient phoned requesting his oxygen be picked up by [facility].  States his O2 levels at home are around 97% and he does not use it.  Will fax order to [facility] for pick up as requested

## 2011-12-28 ENCOUNTER — Other Ambulatory Visit: Payer: Self-pay | Admitting: Cardiology

## 2011-12-29 NOTE — Telephone Encounter (Signed)
Refilled levothyroxine.

## 2012-01-17 ENCOUNTER — Ambulatory Visit (INDEPENDENT_AMBULATORY_CARE_PROVIDER_SITE_OTHER): Payer: Medicare Other | Admitting: *Deleted

## 2012-01-17 ENCOUNTER — Telehealth: Payer: Self-pay | Admitting: *Deleted

## 2012-01-17 DIAGNOSIS — I4891 Unspecified atrial fibrillation: Secondary | ICD-10-CM

## 2012-01-17 NOTE — Telephone Encounter (Signed)
Spoke with patient and he was concerned that his blood pressure was high and uses Midodrine to bring it up.  Will hold Midodrine for now (states he only takes once a day)

## 2012-01-31 ENCOUNTER — Telehealth: Payer: Self-pay | Admitting: *Deleted

## 2012-01-31 NOTE — Telephone Encounter (Signed)
Yes okay to hold coumadin 5 days.

## 2012-01-31 NOTE — Telephone Encounter (Signed)
Patient is scheduled February 21, 2012 for epidural and wants to know if ok to hold Coumadin 5 day prior.  Will forward to  Dr. Patty Sermons for review

## 2012-02-02 NOTE — Telephone Encounter (Signed)
Advised ok  

## 2012-02-03 NOTE — Telephone Encounter (Signed)
Per  Dr. Patty Sermons

## 2012-02-14 ENCOUNTER — Ambulatory Visit (INDEPENDENT_AMBULATORY_CARE_PROVIDER_SITE_OTHER): Payer: Medicare Other | Admitting: *Deleted

## 2012-02-14 DIAGNOSIS — I4891 Unspecified atrial fibrillation: Secondary | ICD-10-CM

## 2012-03-02 ENCOUNTER — Ambulatory Visit (INDEPENDENT_AMBULATORY_CARE_PROVIDER_SITE_OTHER): Payer: Medicare Other | Admitting: Cardiology

## 2012-03-02 ENCOUNTER — Encounter: Payer: Self-pay | Admitting: Cardiology

## 2012-03-02 ENCOUNTER — Ambulatory Visit (INDEPENDENT_AMBULATORY_CARE_PROVIDER_SITE_OTHER): Payer: Medicare Other | Admitting: *Deleted

## 2012-03-02 VITALS — BP 110/80 | HR 78 | Ht 75.0 in | Wt 220.0 lb

## 2012-03-02 DIAGNOSIS — R490 Dysphonia: Secondary | ICD-10-CM

## 2012-03-02 DIAGNOSIS — I4891 Unspecified atrial fibrillation: Secondary | ICD-10-CM

## 2012-03-02 NOTE — Assessment & Plan Note (Signed)
The patient has had chronic excessive phlegm in his throat bothers him.  He has seen Dr. Haroldine Laws about this in the past but no cause was determined.  It still bothers him and he would like to have further evaluation if possible.  We will refer him to Dr. Jenne Pane who will see him on August 5.

## 2012-03-02 NOTE — Progress Notes (Signed)
Marco Gregory Date of Birth:  [birth year] [facility] [address] [place], [place]  [address] [phone]         Fax   [phone]  History of Present Illness: This pleasant 78 year old gentleman is seen for a four-month followup office visit.  He has a past history of dysautonomia with symptomatic orthostatic hypotension.  He has sick sinus syndrome with tachybradycardia syndrome and intermittent atrial fibrillation.  He has a functioning pacemaker.  He is on long-term Coumadin.  He has a history of COPD and was hospitalized in early 2013 with pneumonia following a uneventful left inguinal herniorrhaphy.  He's had a chronic cough but his oxygen saturations at home in the past have been in the range of 95-98%.  As a history of spinal stenosis which has become worse and he states that he will need another epidural injection soon.  He has had a lot of chronic phlegm in his throat which bothers him.  Current Outpatient Prescriptions  Medication Sig Dispense Refill  . acetaminophen (TYLENOL) 500 MG tablet Take 500-1,000 mg by mouth every 6 (six) hours as needed. For pain      . digoxin (LANOXIN) 0.125 MG tablet Take 1 tablet (125 mcg total) by mouth daily.  90 tablet  3  . dorzolamide-timolol (COSOPT) 22.3-6.8 MG/ML ophthalmic solution Place 1 drop into both eyes daily.       . finasteride (PROSCAR) 5 MG tablet Take 5 mg by mouth daily.       . fludrocortisone (FLORINEF) 0.1 MG tablet Take 0.1 mg by mouth every other day.       . Guaifenesin (MUCINEX MAXIMUM STRENGTH) 1200 MG TB12 Take 1 tablet (1,200 mg total) by mouth 2 (two) times daily.  14 each  0  . levothyroxine (SYNTHROID, LEVOTHROID) 175 MCG tablet TAKE 1 TABLET BY MOUTH ONCE DAILY  90 tablet  3  . pyridostigmine (MESTINON) 60 MG tablet Take 60 mg by mouth 2 (two) times daily. Take one tablet every morning & one tablet at lunch.      . silodosin (RAPAFLO) 8 MG CAPS capsule Take 8 mg by mouth daily with  lunch.      . warfarin (COUMADIN) 5 MG tablet Take 1 tablet (5 mg total) by mouth as directed. Take as directed by coumadin clinic. This is a 90 day supply.  90 tablet  1    No Known Allergies  Patient Active Problem List  Diagnosis  . OBSTRUCTIVE SLEEP APNEA  . Hypotension-orthostatic  . Atrial fibrillation  . SICK SINUS/ TACHY-BRADY SYNDROME  . ASTHMATIC BRONCHITIS, ACUTE  . ATELECTASIS  . DYSAUTONOMIA  . PACEMAKER, PERMANENT  . Overactive bladder  . Hoarseness of voice  . Hypertension  . SOB (shortness of breath)  . Chest pain  . Status post inguinal hernia repair, 08/26/2011.    History  Smoking status  . Former Smoker  . Quit date: 08/09/1965  Smokeless tobacco  . Never Used    History  Alcohol Use No    Family History  Problem Relation Age of Onset  . Cancer Mother 17    GYN    Review of Systems: Constitutional: no fever chills diaphoresis or fatigue or change in weight.  Head and neck: no hearing loss, no epistaxis, no photophobia or visual disturbance. Respiratory: No cough, shortness of breath or wheezing. Cardiovascular: No chest pain peripheral edema, palpitations. Gastrointestinal: No abdominal distention, no abdominal pain, no change in bowel habits hematochezia or melena. Genitourinary: No  dysuria, no frequency, no urgency, no nocturia. Musculoskeletal:No arthralgias, no back pain, no gait disturbance or myalgias. Neurological: No dizziness, no headaches, no numbness, no seizures, no syncope, no weakness, no tremors. Hematologic: No lymphadenopathy, no easy bruising. Psychiatric: No confusion, no hallucinations, no sleep disturbance.    Physical Exam: Filed Vitals:   03/02/12 1146  BP: 110/80  Pulse: 78   the general appearance reveals a tall elderly gentleman in no acute distress.  His voice is hoarse.Pupils equal and reactive.   Extraocular Movements are full.  There is no scleral icterus.  The mouth and pharynx are normal.  The neck is  supple.  The carotids reveal no bruits.  The jugular venous pressure is normal.  The thyroid is not enlarged.  There is no lymphadenopathy.  The chest is clear to percussion and auscultation. There are no rales or rhonchi. Expansion of the chest is symmetrical.  The heart reveals a regular rhythm and no gallop. The abdomen is soft and nontender. Bowel sounds are normal. The liver and spleen are not enlarged. There Are no abdominal masses. There are no bruits.  The pedal pulses are good.  There is no phlebitis or edema.  There is no cyanosis or clubbing. Strength is normal and symmetrical in all extremities.  There is no lateralizing weakness.  There are no sensory deficits.     Assessment / Plan: Continue same medication.  Recheck in 3 months for followup office visit EKG CBC and basal metabolic panel. He is referred to Dr. Jenne Pane for ENT evaluation of his chronic excessive phlegm in his throat.

## 2012-03-02 NOTE — Patient Instructions (Addendum)
Have scheduled an appointment with Dr Jenne Pane for March 13, 2012 at 3:20.  You need to arrive 15 minutes early with a list of your medication, insurance cards, and copay at check in.  Your physician recommends that you continue on your current medications as directed. Please refer to the Current Medication list given to you today.  Your physician recommends that you schedule a follow-up appointment in: 3 months ov/ekg/bmet/cbc

## 2012-03-02 NOTE — Assessment & Plan Note (Signed)
His orthostatic hypotension has improved and he has been able to cut back on his midodrine dose

## 2012-03-02 NOTE — Assessment & Plan Note (Signed)
Patient is on chronic Coumadin.  He has not had any TIA symptoms.  Presently he is in normal sinus rhythm.  If he needs to come off Coumadin for his epidural steroid injection he can do so without Lovenox bridging as long as his rhythm remained sinus.

## 2012-03-06 ENCOUNTER — Telehealth: Payer: Self-pay | Admitting: Cardiology

## 2012-03-10 ENCOUNTER — Telehealth: Payer: Self-pay | Admitting: Cardiology

## 2012-03-10 ENCOUNTER — Inpatient Hospital Stay (HOSPITAL_COMMUNITY)
Admission: EM | Admit: 2012-03-10 | Discharge: 2012-03-12 | DRG: 190 | Disposition: A | Discharge status: Home Health | Payer: Medicare Other | Attending: Internal Medicine | Admitting: Internal Medicine

## 2012-03-10 ENCOUNTER — Emergency Department (HOSPITAL_COMMUNITY): Payer: Medicare Other

## 2012-03-10 ENCOUNTER — Telehealth: Payer: Self-pay | Admitting: *Deleted

## 2012-03-10 ENCOUNTER — Encounter (HOSPITAL_COMMUNITY): Payer: Self-pay | Admitting: *Deleted

## 2012-03-10 DIAGNOSIS — I4891 Unspecified atrial fibrillation: Secondary | ICD-10-CM | POA: Diagnosis present

## 2012-03-10 DIAGNOSIS — M109 Gout, unspecified: Secondary | ICD-10-CM | POA: Diagnosis present

## 2012-03-10 DIAGNOSIS — J209 Acute bronchitis, unspecified: Secondary | ICD-10-CM

## 2012-03-10 DIAGNOSIS — Z7901 Long term (current) use of anticoagulants: Secondary | ICD-10-CM

## 2012-03-10 DIAGNOSIS — S98139A Complete traumatic amputation of one unspecified lesser toe, initial encounter: Secondary | ICD-10-CM

## 2012-03-10 DIAGNOSIS — I495 Sick sinus syndrome: Secondary | ICD-10-CM

## 2012-03-10 DIAGNOSIS — I509 Heart failure, unspecified: Secondary | ICD-10-CM | POA: Diagnosis present

## 2012-03-10 DIAGNOSIS — Q078 Other specified congenital malformations of nervous system: Secondary | ICD-10-CM

## 2012-03-10 DIAGNOSIS — Z950 Presence of cardiac pacemaker: Secondary | ICD-10-CM

## 2012-03-10 DIAGNOSIS — J9819 Other pulmonary collapse: Secondary | ICD-10-CM

## 2012-03-10 DIAGNOSIS — I10 Essential (primary) hypertension: Secondary | ICD-10-CM | POA: Diagnosis present

## 2012-03-10 DIAGNOSIS — J441 Chronic obstructive pulmonary disease with (acute) exacerbation: Principal | ICD-10-CM | POA: Diagnosis present

## 2012-03-10 DIAGNOSIS — G4733 Obstructive sleep apnea (adult) (pediatric): Secondary | ICD-10-CM | POA: Diagnosis present

## 2012-03-10 DIAGNOSIS — E8770 Fluid overload, unspecified: Secondary | ICD-10-CM

## 2012-03-10 DIAGNOSIS — I5033 Acute on chronic diastolic (congestive) heart failure: Secondary | ICD-10-CM | POA: Diagnosis present

## 2012-03-10 DIAGNOSIS — R490 Dysphonia: Secondary | ICD-10-CM

## 2012-03-10 DIAGNOSIS — R0602 Shortness of breath: Secondary | ICD-10-CM | POA: Diagnosis present

## 2012-03-10 DIAGNOSIS — N400 Enlarged prostate without lower urinary tract symptoms: Secondary | ICD-10-CM | POA: Diagnosis present

## 2012-03-10 DIAGNOSIS — J40 Bronchitis, not specified as acute or chronic: Secondary | ICD-10-CM

## 2012-03-10 DIAGNOSIS — N3281 Overactive bladder: Secondary | ICD-10-CM

## 2012-03-10 DIAGNOSIS — Z8719 Personal history of other diseases of the digestive system: Secondary | ICD-10-CM

## 2012-03-10 HISTORY — DX: Hypothyroidism, unspecified: E03.9

## 2012-03-10 HISTORY — DX: Pneumonia, unspecified organism: J18.9

## 2012-03-10 HISTORY — DX: Other specified cardiac arrhythmias: I49.8

## 2012-03-10 HISTORY — DX: Osteomyelitis, unspecified: M86.9

## 2012-03-10 HISTORY — DX: Spinal stenosis, lumbar region without neurogenic claudication: M48.061

## 2012-03-10 LAB — URINALYSIS, ROUTINE W REFLEX MICROSCOPIC
Bilirubin Urine: NEGATIVE
Ketones, ur: NEGATIVE mg/dL
Nitrite: NEGATIVE
Specific Gravity, Urine: 1.007 (ref 1.005–1.030)
Urobilinogen, UA: 0.2 mg/dL (ref 0.0–1.0)

## 2012-03-10 LAB — CBC WITH DIFFERENTIAL/PLATELET
Basophils Relative: 0 % (ref 0–1)
Hemoglobin: 14.7 g/dL (ref 13.0–17.0)
Lymphs Abs: 1.4 10*3/uL (ref 0.7–4.0)
Monocytes Relative: 11 % (ref 3–12)
Neutro Abs: 5.3 10*3/uL (ref 1.7–7.7)
Neutrophils Relative %: 71 % (ref 43–77)
Platelets: 153 10*3/uL (ref 150–400)
RBC: 4.5 MIL/uL (ref 4.22–5.81)

## 2012-03-10 LAB — COMPREHENSIVE METABOLIC PANEL
BUN: 17 mg/dL (ref 6–23)
CO2: 32 mEq/L (ref 19–32)
Calcium: 9.9 mg/dL (ref 8.4–10.5)
Creatinine, Ser: 0.82 mg/dL (ref 0.50–1.35)
GFR calc Af Amer: 89 mL/min — ABNORMAL LOW (ref >90)
GFR calc non Af Amer: 77 mL/min — ABNORMAL LOW (ref >90)
Glucose, Bld: 93 mg/dL (ref 70–99)
Total Bilirubin: 0.9 mg/dL (ref 0.3–1.2)

## 2012-03-10 LAB — CARDIAC PANEL(CRET KIN+CKTOT+MB+TROPI)
Relative Index: INVALID (ref 0.0–2.5)
Troponin I: <0.3 ng/mL (ref <0.30)

## 2012-03-10 LAB — PROTIME-INR
INR: 1.92 — ABNORMAL HIGH (ref 0.00–1.49)
Prothrombin Time: 22.3 seconds — ABNORMAL HIGH (ref 11.6–15.2)

## 2012-03-10 LAB — POCT I-STAT TROPONIN I

## 2012-03-10 LAB — PRO B NATRIURETIC PEPTIDE: Pro B Natriuretic peptide (BNP): 1840 pg/mL — ABNORMAL HIGH (ref 0–450)

## 2012-03-10 LAB — DIGOXIN LEVEL: Digoxin Level: 0.3 ng/mL — ABNORMAL LOW (ref 0.8–2.0)

## 2012-03-10 LAB — D-DIMER, QUANTITATIVE: D-Dimer, Quant: 0.45 ug/mL-FEU (ref 0.00–0.48)

## 2012-03-10 MED ORDER — METHYLPREDNISOLONE SODIUM SUCC 125 MG IJ SOLR
125.0000 mg | Freq: Once | INTRAMUSCULAR | Status: AC
  Administered 2012-03-10: 125 mg via INTRAVENOUS
  Filled 2012-03-10: qty 2

## 2012-03-10 MED ORDER — METHYLPREDNISOLONE SODIUM SUCC 125 MG IJ SOLR
60.0000 mg | Freq: Four times a day (QID) | INTRAMUSCULAR | Status: DC
  Administered 2012-03-10 – 2012-03-11 (×2): 60 mg via INTRAVENOUS
  Filled 2012-03-10 (×2): qty 0.96
  Filled 2012-03-10: qty 2
  Filled 2012-03-10 (×3): qty 0.96

## 2012-03-10 MED ORDER — TAMSULOSIN HCL 0.4 MG PO CAPS
0.4000 mg | ORAL_CAPSULE | Freq: Every day | ORAL | Status: DC
  Administered 2012-03-11: 0.4 mg via ORAL
  Filled 2012-03-10 (×2): qty 1

## 2012-03-10 MED ORDER — FINASTERIDE 5 MG PO TABS
5.0000 mg | ORAL_TABLET | Freq: Every day | ORAL | Status: DC
  Administered 2012-03-11 – 2012-03-12 (×2): 5 mg via ORAL
  Filled 2012-03-10 (×2): qty 1

## 2012-03-10 MED ORDER — IPRATROPIUM BROMIDE 0.02 % IN SOLN
0.5000 mg | Freq: Once | RESPIRATORY_TRACT | Status: AC
  Administered 2012-03-10: 0.5 mg via RESPIRATORY_TRACT
  Filled 2012-03-10: qty 2.5

## 2012-03-10 MED ORDER — DORZOLAMIDE HCL-TIMOLOL MAL 2-0.5 % OP SOLN
1.0000 [drp] | Freq: Every day | OPHTHALMIC | Status: DC
  Administered 2012-03-10 – 2012-03-12 (×3): 1 [drp] via OPHTHALMIC
  Filled 2012-03-10: qty 10

## 2012-03-10 MED ORDER — ADULT MULTIVITAMIN W/MINERALS CH
1.0000 | ORAL_TABLET | Freq: Every day | ORAL | Status: DC
  Administered 2012-03-11 – 2012-03-12 (×2): 1 via ORAL
  Filled 2012-03-10 (×2): qty 1

## 2012-03-10 MED ORDER — LEVOTHYROXINE SODIUM 175 MCG PO TABS
175.0000 ug | ORAL_TABLET | Freq: Every day | ORAL | Status: DC
  Administered 2012-03-11: 175 ug via ORAL
  Filled 2012-03-10 (×2): qty 1

## 2012-03-10 MED ORDER — ALBUTEROL SULFATE (5 MG/ML) 0.5% IN NEBU
2.5000 mg | INHALATION_SOLUTION | Freq: Four times a day (QID) | RESPIRATORY_TRACT | Status: DC
  Administered 2012-03-11 (×3): 2.5 mg via RESPIRATORY_TRACT
  Filled 2012-03-10 (×3): qty 0.5

## 2012-03-10 MED ORDER — ALBUTEROL SULFATE (5 MG/ML) 0.5% IN NEBU
5.0000 mg | INHALATION_SOLUTION | Freq: Once | RESPIRATORY_TRACT | Status: AC
  Administered 2012-03-10: 5 mg via RESPIRATORY_TRACT
  Filled 2012-03-10: qty 1

## 2012-03-10 MED ORDER — LEVOFLOXACIN IN D5W 750 MG/150ML IV SOLN
750.0000 mg | INTRAVENOUS | Status: DC
  Administered 2012-03-10 – 2012-03-11 (×2): 750 mg via INTRAVENOUS
  Filled 2012-03-10 (×3): qty 150

## 2012-03-10 MED ORDER — PYRIDOSTIGMINE BROMIDE 60 MG PO TABS
60.0000 mg | ORAL_TABLET | Freq: Two times a day (BID) | ORAL | Status: DC
  Administered 2012-03-11 – 2012-03-12 (×3): 60 mg via ORAL
  Filled 2012-03-10 (×5): qty 1

## 2012-03-10 MED ORDER — SODIUM CHLORIDE 0.9 % IJ SOLN: 3.0000 mL | INTRAMUSCULAR | Status: DC | PRN

## 2012-03-10 MED ORDER — SODIUM CHLORIDE 0.9 % IJ SOLN
3.0000 mL | Freq: Two times a day (BID) | INTRAMUSCULAR | Status: DC
  Administered 2012-03-10 – 2012-03-11 (×3): 3 mL via INTRAVENOUS

## 2012-03-10 MED ORDER — ONDANSETRON HCL 4 MG/2ML IJ SOLN
4.0000 mg | Freq: Four times a day (QID) | INTRAMUSCULAR | Status: DC | PRN
  Filled 2012-03-10: qty 2

## 2012-03-10 MED ORDER — DIGOXIN 125 MCG PO TABS
125.0000 ug | ORAL_TABLET | Freq: Every day | ORAL | Status: DC
  Administered 2012-03-11 – 2012-03-12 (×2): 125 ug via ORAL
  Filled 2012-03-10 (×2): qty 1

## 2012-03-10 MED ORDER — FUROSEMIDE 10 MG/ML IJ SOLN
60.0000 mg | Freq: Once | INTRAMUSCULAR | Status: AC
  Administered 2012-03-10: 60 mg via INTRAVENOUS
  Filled 2012-03-10: qty 6

## 2012-03-10 MED ORDER — ACETAMINOPHEN 325 MG PO TABS: 650.0000 mg | ORAL_TABLET | ORAL | Status: DC | PRN

## 2012-03-10 MED ORDER — GUAIFENESIN ER 600 MG PO TB12
1200.0000 mg | ORAL_TABLET | Freq: Two times a day (BID) | ORAL | Status: DC
  Administered 2012-03-10 – 2012-03-12 (×4): 1200 mg via ORAL
  Filled 2012-03-10 (×5): qty 2

## 2012-03-10 MED ORDER — ALBUTEROL SULFATE (5 MG/ML) 0.5% IN NEBU: 2.5000 mg | INHALATION_SOLUTION | RESPIRATORY_TRACT | Status: DC | PRN

## 2012-03-10 MED ORDER — SODIUM CHLORIDE 0.9 % IV SOLN: 250.0000 mL | INTRAVENOUS | Status: DC | PRN

## 2012-03-10 MED ORDER — WARFARIN - PHARMACIST DOSING INPATIENT: Freq: Every day | Status: DC

## 2012-03-10 MED ORDER — FUROSEMIDE 10 MG/ML IJ SOLN
40.0000 mg | Freq: Two times a day (BID) | INTRAMUSCULAR | Status: DC
  Administered 2012-03-10 – 2012-03-12 (×4): 40 mg via INTRAVENOUS
  Filled 2012-03-10 (×6): qty 4

## 2012-03-10 MED ORDER — WARFARIN SODIUM 3 MG PO TABS
3.0000 mg | ORAL_TABLET | Freq: Once | ORAL | Status: AC
  Administered 2012-03-10: 3 mg via ORAL
  Filled 2012-03-10: qty 1

## 2012-03-10 MED ORDER — IPRATROPIUM BROMIDE 0.02 % IN SOLN
0.5000 mg | Freq: Four times a day (QID) | RESPIRATORY_TRACT | Status: DC
  Administered 2012-03-11 (×3): 0.5 mg via RESPIRATORY_TRACT
  Filled 2012-03-10 (×3): qty 2.5

## 2012-03-10 NOTE — Progress Notes (Signed)
ANTICOAGULATION CONSULT NOTE - Initial Consult  Pharmacy Consult for Coumadin Indication: atrial fibrillation  No Known Allergies  Patient Measurements: Height: 6\' 3"  (190.5 cm) Weight: 213 lb (96.616 kg) (scale B) IBW/kg (Calculated) : 84.5   Vital Signs: Temp: 97.5 F (36.4 C) (08/02 1945) Temp src: Oral (08/02 1945) BP: 139/72 mmHg (08/02 1945) Pulse Rate: 110  (08/02 1945)  Labs:  Basename 03/10/12 2014 03/10/12 1312 03/10/12 1259  HGB -- -- 14.7  HCT -- -- 43.9  PLT -- -- 153  APTT -- -- --  LABPROT -- 22.3* --  INR -- 1.92* --  HEPARINUNFRC -- -- --  CREATININE -- -- 0.82  CKTOTAL 53 -- --  CKMB 3.9 -- --  TROPONINI <0.30 -- --    Estimated Creatinine Clearance: 74.4 ml/min (by C-G formula based on Cr of 0.82).   Medical History: Past Medical History  Diagnosis Date  . Atrial fibrillation   . Long-term (current) use of anticoagulants   . Glaucoma   . Gout   . Hyperthyroidism   . Orthostatic hypotension   . Redundant prepuce and phimosis   . Acute cystitis   . Hypertonicity of bladder   . Bradycardia        . HTN (hypertension)   . Atelectasis   . Dysautonomia   . Pacemaker     St. Jude; device generator replacement 2012  . CHF (congestive heart failure)     Previous preserved EF.  [name] Sleep apnea     does not use cpap not in 4 yrs  . Chronic kidney disease [facility]     UTI  1 MO AGO [place] MEDICALLY     Assessment: 55 YOM with hx of afib on chronic coumadin admitted for worsening sob. Pharmacy is consulted to manage coumadin during hospitalization. Patient home dose is 2.5mg  daily per med history. INR 1.92, last dose was taken on 8/1 H/H/Plts wnl. Also noted levaquin was started today.  Goal of Therapy:  INR 2-3 Monitor platelets by anticoagulation protocol: Yes   Plan:  - Coumadin 3mg  po x 1 now - daily PT/INR with am labs.  Bayard Hugger, PharmD, BCPS  Clinical Pharmacist  Pager: [phone]  03/10/2012,9:02 PM

## 2012-03-10 NOTE — Telephone Encounter (Signed)
Spoke with patient and he is unable to sleep in his bed has to sleep in recliner sitting up. Will additional information to  Dr. Patty Sermons

## 2012-03-10 NOTE — Telephone Encounter (Signed)
Pt's dtr calling re pt in the ER  At [facility] extra fluid, SOB , giving him lasix and  if that  work he will admited [address]

## 2012-03-10 NOTE — Consult Note (Signed)
CARDIOLOGY CONSULT NOTE    Patient ID: Marco Gregory MRN: [id number] DOB/AGE: 01/06/1921 78 y.o.  Admit date: 03/10/2012 Referring Physician:  RAI Primary Physician: Cassell Clement, MD Primary Cardiologist:  Patty Sermons Reason for Consultation: Dyspnea CHF  Principal Problem:  *SOB (shortness of breath) Active Problems:  OBSTRUCTIVE SLEEP APNEA  Atrial fibrillation  PACEMAKER, PERMANENT  Hypertension  CHF, acute on chronic  COPD exacerbation   HPI:  78 yo with history of PAF, SSS and pacer.  History of orthostatic hypotension previously on midodrine and still on florinef.  Has chronic post nasal drip and phlegm in throat.  Sees [facility]NT [facility].  . Pt has had 1 week of progressive SOB especially when lying flat. States he has choking sensation. + cough but not productive. No fever chills, CP, lower ext swelling.  Pt has mild lightheadedness when changing positions quickly that is long term has not changed recently. Pt states he has been drinking a lot of water recently. BNP elevated at 1890 and CXR suggesting mild cephalization.  Echo in January of this year showed normal EF  Study Conclusions  - Left ventricle: The cavity size was mildly dilated. Wall thickness was increased in a pattern of mild LVH. Systolic function was normal. The estimated ejection fraction was in the range of 55% to 60%. Wall motion was normal; there were no regional wall motion abnormalities. - Aortic valve: Valve mobility was mildly restricted. There was mild stenosis. Mild regurgitation. Valve area: 1.94cm^2(VTI). Valve area: 1.88cm^2 (Vmax). - Mitral valve: Calcified annulus. - Left atrium: The atrium was moderately dilated. - Right atrium: The atrium was moderately dilated. - Pulmonary arteries: Systolic pressure was mildly increased. PA peak pressure: 42mm Hg (S).    @ROS @ All other systems reviewed and negative except as noted above  Past Medical History  Diagnosis Date  . Atrial  fibrillation   . Long-term (current) use of anticoagulants   . Glaucoma   . Gout   . Hyperthyroidism   . Orthostatic hypotension   . Redundant prepuce and phimosis   . Acute cystitis   . Hypertonicity of bladder   . Bradycardia        . HTN (hypertension)   . Atelectasis   . Dysautonomia   . Pacemaker     St. Jude; device generator replacement 2012  . CHF (congestive heart failure)     Previous preserved EF.  [name] Sleep apnea     does not use cpap not in 4 yrs  . Chronic kidney disease [facility]     UTI  1 MO AGO [place] MEDICALLY     Family History  Problem Relation Age of Onset  . Cancer Mother 45    GYN    History   Social History  . Marital Status: Married    Spouse Name: N/A    Number of Children: 2  . Years of Education: N/A   Occupational History  . Retired    Social History Main Topics  . Smoking status: Former Smoker    Quit date: 08/09/1965  . Smokeless tobacco: Never Used  . Alcohol Use: No  . Drug Use: No  . Sexually Active: Not Currently   Other Topics Concern  . Not on file   Social History Narrative  . No narrative on file    Past Surgical History  Procedure Date  . Appendectomy   . Hernia repair   . Tonsillectomy   . Cardioversion 12/19/2003    cardioversion and pacemaker interrogation  .  Elbow bursa surgery 04/07/2006    Right elbow septic olecranon bursitis  . Toe amputation     Osteomyelitis, right foot second toe /  Right foot second ray amputation  . Pacemaker insertion 07/04/2002  . Insert / replace / remove pacemaker 07/04/2002 dr Graciela Husbands     dual chamber pacemaker - St. Jude model 1688 TC 58 cm active fixation lead was placed into the right ventricle and the St Jude model 1688 TC 52 cm active fixation pacing lead was placed in the right atrium.  The ventricular lead serial number was [id number] and the atrial lead serial number was [id number]/  Of note, the pacemaker generator was an identity [id number] serial number [id number]   . Eye  surgery 01/2005  . Cardiac catheterization 07/03/2002    EF 60-70% /  Normal left ventricular function. / Very mild trivial three-vessel coronary atherosclerosis. / There is no mitral regurgitation noted  . Inguinal hernia repair 08/26/2011    Procedure: HERNIA REPAIR INGUINAL ADULT;  Surgeon: Kandis Cocking, MD;  Location[facility];  Service: General;  Laterality: Left;  open left inguinal hernia        . albuterol  5 mg Nebulization Once  . furosemide  60 mg Intravenous Once  . ipratropium  0.5 mg Nebulization Once  . levofloxacin (LEVAQUIN) IV  750 mg Intravenous Q24H  . methylPREDNISolone (SOLU-MEDROL) injection  125 mg Intravenous Once      Physical Exam: Blood pressure 152/94, pulse 56, temperature 97.6 F (36.4 C), temperature source Oral, resp. rate 18, height 6\' 3"  (1.905 m), weight 95.255 kg (210 lb), SpO2 92.00%.   Affect appropriate Healthy:  appears stated age HEENT: injected sclera Neck supple with no adenopathy JVP normal no bruits no thyromegaly Lungs diffuse rhonchi and  wheezing and good diaphragmatic motion Heart:  S1/S2 mild AS  murmur, no rub, gallop or click PMI normal Abdomen: benighn, BS positve, no tenderness, no AAA no bruit.  No HSM or HJR Distal pulses intact with no bruits No edema Neuro non-focal Skin warm and dry No muscular weakness Pacer under left clavicle   Labs:   Lab Results  Component Value Date   WBC 7.6 03/10/2012   HGB 14.7 03/10/2012   HCT 43.9 03/10/2012   MCV 97.6 03/10/2012   PLT 153 03/10/2012    Lab 03/10/12 1259  NA 140  K 4.3  CL 100  CO2 32  BUN 17  CREATININE 0.82  CALCIUM 9.9  PROT 8.0  BILITOT 0.9  ALKPHOS 66  ALT 14  AST 22  GLUCOSE 93   Lab Results  Component Value Date   CKTOTAL 32 08/29/2011   CKMB 2.4 08/29/2011   TROPONINI <0.30 08/29/2011       Radiology: Dg Chest 2 View  03/10/2012  *RADIOLOGY REPORT*  Clinical Data: Shortness of breath and dizziness.  Cough and congestion  CHEST - 2 VIEW  Comparison:  Chest CT 08/31/2011, 08/31/2011, and 04/14/2011  Findings: Left chest wall battery pack with pacer leads terminating in the right atrium, right ventricle.  Cardiomegaly is stable. Pulmonary vascularity mildly congested.  Lung volumes slightly low, with chronic slight elevation of the left hemidiaphragm.  Mild subpleural reticulation/interstitial prominence in the lung bases is unchanged.  No focal airspace disease, pulmonary edema or pleural effusion.  The bones are osteopenic.  No acute bony abnormality identified.  IMPRESSION: Stable examination.  Cardiomegaly with mild pulmonary vascular congestion.  Slight stable interstitial prominence at the lung bases.  Original Report Authenticated  By: Britta Mccreedy, M.D.    EKG: Afib with V pacing 03/10/2012    ASSESSMENT AND PLAN:  Dyspnea:  Clinically and on exam this is a lung process with congestion and bronchitis.  However with elevated BNP and orthopnea he likely has component of diastolic dysfunction.  Would stop florinef as this will exacerbate  Fluid retension and give low dose iv lasix for two days and reassess.  Rx with nebulizers and routine pulmonary care as well as steroids Orthostatic Hypotension:  Dizzyness has been much better lately and currently not orthostatic with systolic standing 150 mmHg.  Stop florinef, monitor postural vital signs and only restart midodrine not florinef if it recurs Afib:  Continue with anticoagulation follow INR Pacer:  Normal function on telemetry  Signed: Charlton Haws 03/10/2012, 6:59 PM

## 2012-03-10 NOTE — ED Notes (Signed)
Pt placed on cardiac monitor and continuous pulse ox.

## 2012-03-10 NOTE — ED Provider Notes (Signed)
History     CSN: [id number]  Arrival date & time 03/10/12  1247   First MD Initiated Contact with Patient 03/10/12 1502      Chief Complaint  Patient presents with  . Shortness of Breath  . Dizziness    (Consider location/radiation/quality/duration/timing/severity/associated sxs/prior treatment) HPI Pt has had 1 week of progressive SOB especially when lying flat. States he has choking sensation. + cough but not productive. No fever chills, CP, lower ext swelling. Pt has previous hx of CHF with similar symptoms. Pt has mild lightheadedness when changing positions quickly that is long term has not changed recently. Pt states he has been drinking a lot of water recently.  Past Medical History  Diagnosis Date  . Atrial fibrillation   . Long-term (current) use of anticoagulants   . Glaucoma   . Gout   . Hyperthyroidism   . Orthostatic hypotension   . Redundant prepuce and phimosis   . Acute cystitis   . Hypertonicity of bladder   . Bradycardia        . HTN (hypertension)   . Atelectasis   . Dysautonomia   . Pacemaker     St. Jude; device generator replacement 2012  . CHF (congestive heart failure)     Previous preserved EF.  [name] Sleep apnea     does not use cpap not in 4 yrs  . Chronic kidney disease [facility]     UTI  1 MO AGO [place] MEDICALLY     Past Surgical History  Procedure Date  . Appendectomy   . Hernia repair   . Tonsillectomy   . Cardioversion 12/19/2003    cardioversion and pacemaker interrogation  . Elbow bursa surgery 04/07/2006    Right elbow septic olecranon bursitis  . Toe amputation     Osteomyelitis, right foot second toe /  Right foot second ray amputation  . Pacemaker insertion 07/04/2002  . Insert / replace / remove pacemaker 07/04/2002 dr Graciela Husbands     dual chamber pacemaker - St. Jude model 1688 TC 58 cm active fixation lead was placed into the right ventricle and the St Jude model 1688 TC 52 cm active fixation pacing lead was placed in the  right atrium.  The ventricular lead serial number was [id number] and the atrial lead serial number was [id number]/  Of note, the pacemaker generator was an identity [id number] serial number [id number]   . Eye surgery 01/2005  . Cardiac catheterization 07/03/2002    EF 60-70% /  Normal left ventricular function. / Very mild trivial three-vessel coronary atherosclerosis. / There is no mitral regurgitation noted  . Inguinal hernia repair 08/26/2011    Procedure: HERNIA REPAIR INGUINAL ADULT;  Surgeon: Kandis Cocking, MD;  Location: [facility] OR;  Service: General;  Laterality: Left;  open left inguinal hernia    Family History  Problem Relation Age of Onset  . Cancer Mother 40    GYN    History  Substance Use Topics  . Smoking status: Former Smoker    Quit date: 08/09/1965  . Smokeless tobacco: Never Used  . Alcohol Use: No      Review of Systems  Constitutional: Negative for fever and chills.  Respiratory: Positive for shortness of breath. Negative for cough and wheezing.   Cardiovascular: Negative for chest pain, palpitations and leg swelling.  Gastrointestinal: Positive for nausea. Negative for vomiting and abdominal pain.  Genitourinary: Negative for dysuria and frequency.  Musculoskeletal: Negative for myalgias and arthralgias.  Skin: Negative for  rash and wound.  Neurological: Positive for dizziness and light-headedness. Negative for weakness, numbness and headaches.    Allergies  Review of patient's allergies indicates no known allergies.  Home Medications   Current Outpatient Rx  Name Route Sig Dispense Refill  . ACETAMINOPHEN 500 MG PO TABS Oral Take 500-1,000 mg by mouth every 6 (six) hours as needed. For pain    . DIGOXIN 0.125 MG PO TABS Oral Take 125 mcg by mouth daily.    . DORZOLAMIDE HCL-TIMOLOL MAL 22.3-6.8 MG/ML OP SOLN Both Eyes Place 1 drop into both eyes daily.     [name] FINASTERIDE 5 MG PO TABS Oral Take 5 mg by mouth daily.     [name] FLUDROCORTISONE ACETATE 0.1 MG PO TABS Oral  Take 0.1 mg by mouth every other day.     . GUAIFENESIN ER 1200 MG PO TB12 Oral Take 1 tablet by mouth 2 (two) times daily.    [name] LEVOTHYROXINE SODIUM 175 MCG PO TABS Oral Take 175 mcg by mouth daily.    . ADULT MULTIVITAMIN W/MINERALS CH Oral Take 1 tablet by mouth daily.    [name] PYRIDOSTIGMINE BROMIDE 60 MG PO TABS Oral Take 60 mg by mouth 2 (two) times daily. Take one tablet every morning & one tablet at lunch.    [name] SILODOSIN 8 MG PO CAPS Oral Take 8 mg by mouth daily with lunch.    . WARFARIN SODIUM 5 MG PO TABS Oral Take 2.5 mg by mouth daily. Take as directed by coumadin clinic. This is a 90 day supply.      BP 152/94  Pulse 56  Temp 97.6 F (36.4 C) (Oral)  Resp 18  Ht 6\' 3"  (1.905 m)  Wt 210 lb (95.255 kg)  BMI 26.25 kg/m2  SpO2 92%  Physical Exam  Nursing note and vitals reviewed. Constitutional: He is oriented to person, place, and time. He appears well-developed and well-nourished. No distress.  HENT:  Head: Normocephalic and atraumatic.  Mouth/Throat: Oropharynx is clear and moist.  Eyes: EOM are normal. Pupils are equal, round, and reactive to light.  Neck: Normal range of motion. Neck supple.  Cardiovascular: Normal rate and regular rhythm.   Pulmonary/Chest: Effort normal. No respiratory distress. He has no wheezes. He has rales.       Bl bases up to mid back  Abdominal: Soft. Bowel sounds are normal. There is no tenderness. There is no rebound and no guarding.  Musculoskeletal: Normal range of motion. He exhibits no edema and no tenderness.  Neurological: He is alert and oriented to person, place, and time.       5/5 motor, sensation intact  Skin: Skin is warm and dry. No rash noted. No erythema.  Psychiatric: He has a normal mood and affect. His behavior is normal.    ED Course  Procedures (including critical care time)  Labs Reviewed  COMPREHENSIVE METABOLIC PANEL - Abnormal; Notable for the following:    GFR calc non Af Amer 77 (*)     GFR calc Af Amer 89  (*)     All other components within normal limits  PRO B NATRIURETIC PEPTIDE - Abnormal; Notable for the following:    Pro B Natriuretic peptide (BNP) 1840.0 (*)     All other components within normal limits  PROTIME-INR - Abnormal; Notable for the following:    Prothrombin Time 22.3 (*)     INR 1.92 (*)     All other components within normal limits  URINALYSIS, ROUTINE  W REFLEX MICROSCOPIC - Abnormal; Notable for the following:    Hgb urine dipstick SMALL (*)     All other components within normal limits  CBC WITH DIFFERENTIAL  POCT I-STAT TROPONIN I  URINE MICROSCOPIC-ADD ON  DIGOXIN LEVEL  D-DIMER, QUANTITATIVE   Dg Chest 2 View  03/10/2012  *RADIOLOGY REPORT*  Clinical Data: Shortness of breath and dizziness.  Cough and congestion  CHEST - 2 VIEW  Comparison: Chest CT 08/31/2011, 08/31/2011, and 04/14/2011  Findings: Left chest wall battery pack with pacer leads terminating in the right atrium, right ventricle.  Cardiomegaly is stable. Pulmonary vascularity mildly congested.  Lung volumes slightly low, with chronic slight elevation of the left hemidiaphragm.  Mild subpleural reticulation/interstitial prominence in the lung bases is unchanged.  No focal airspace disease, pulmonary edema or pleural effusion.  The bones are osteopenic.  No acute bony abnormality identified.  IMPRESSION: Stable examination.  Cardiomegaly with mild pulmonary vascular congestion.  Slight stable interstitial prominence at the lung bases.  Original Report Authenticated By: Britta Mccreedy, M.D.     1. Fluid overload   2. Bronchitis      Date: 03/10/2012  Rate: 68  Rhythm: ventricularly paced  QRS Axis: indeterminate  Intervals: QRS prolonged  ST/T Wave abnormalities: nonspecific T wave changes  Conduction Disutrbances:none  Narrative Interpretation:   Old EKG Reviewed: none available Ventricularly paced with occasional PVC's    MDM  Pulmonary edema. Will attempt to diurese and reassess.   Pt  diuresed 1500 ml and states no longer having SOB when lying flat but cotinue to have chest congestion and wheezing. Will give steroids, nebs and abx and admit to [facility]      Loren Racer, MD 03/10/12 [phone]

## 2012-03-10 NOTE — Telephone Encounter (Signed)
Spoke with patient and he has been having a lot of phlegm that just will not seem to come up.  Using Mucinex and has appointment with Dr Jenne Pane on Monday.  This am blood pressure up to 164/92 and had some cold sweats. Patient does have dry cough and wife states he just does not feel well.  Will forward to  Dr. Patty Sermons for review

## 2012-03-10 NOTE — Telephone Encounter (Signed)
F/u   Patient returning call back to nurse.  

## 2012-03-10 NOTE — ED Notes (Signed)
Patient reports he has felt short of breath for several days but notes worse last night.  Patient denies chest pain.  He reports dizziness when standing up.  He denies heart disease

## 2012-03-10 NOTE — ED Notes (Signed)
Admitting MD at bedside.

## 2012-03-10 NOTE — ED Notes (Signed)
Pt states that he has noticed inc SOB x1wk or more. SOB exacerbated with movement. Denies chest pain. States he has had a productive cough with "golden phlegm."

## 2012-03-10 NOTE — Telephone Encounter (Signed)
F/u    Patient daughter Loren Racer [phone], states patient is at the ER please return her call .

## 2012-03-10 NOTE — Telephone Encounter (Signed)
Spoke with daughter and patient to call Monday to schedule follow up appointment

## 2012-03-10 NOTE — ED Notes (Signed)
MD at bedside. 

## 2012-03-10 NOTE — Telephone Encounter (Signed)
Pt has another question foe melinda, pls call

## 2012-03-10 NOTE — H&P (Signed)
History and Physical       Hospital Admission Note Date: 03/10/2012  Patient name: Marco Gregory Medical record number: [id number] Date of birth: July 09, 1921 Age: 78 y.o. Gender: male PCP: Cassell Clement, MD   Chief Complaint:  Worsening Shortness of breath with wheezing for last one week  HPI: Patient is 78 year old male with history of atrial fibrillation on Coumadin, hypertension with orthostatic hypotension, tachycardia bradycardia syndrome with permanent pacemaker, BPH, history of CHF with preserved EF, obstructive sleep apnea (does not use CPAP) presented to [facility] ED with progressively worsening shortness of breath especially on lying flat. Per patient he saw Dr. Patty Sermons in office a week ago and was doing well, however the next day he started having some shortness of breath and coughing. Patient told that it progressively continued to worsen and he was unable to lie flat, has been trying to sleep on recliner. He also is having yellowish productive phlegm but no fevers or chills. He did admit to drinking a lot of water recently as he was feeling some dizziness. At the time of the encounter he denies any dizziness or lightheadedness, chest pain. He also has been having significant wheezing but denied any formal diagnosis of COPD, he's not on any inhalers or nebulizers, he's not on oxygen at home. Patient received Lasix in the emergency room, 60 mg x1, and patient had about a liter of urine output, and states that his symptoms seemed better after Lasix. He denies any recent travels or any sick contacts.  Review of Systems:  Constitutional: Denies fever, chills, diaphoresis, appetite change and fatigue.  HEENT: Denies photophobia, eye pain, redness, hearing loss, ear pain, congestion, sore throat, rhinorrhea, sneezing, mouth sores, trouble swallowing, neck pain, neck stiffness and tinnitus.   Respiratory: Please see history of  present illness Cardiovascular: please see history of present illness Gastrointestinal: Denies nausea, vomiting, abdominal pain, diarrhea, constipation, blood in stool and abdominal distention.  Genitourinary: Denies dysuria, urgency, frequency, hematuria, flank pain and difficulty urinating.  Musculoskeletal: Denies myalgias, back pain, joint swelling, arthralgias and gait problem.  Skin: Denies pallor, rash and wound.  Neurological: Denies seizures, syncope,  numbness and headaches.  patient denies any dizziness or lightheadedness currently Hematological: Denies adenopathy. Easy bruising, personal or family bleeding history  Psychiatric/Behavioral: Denies suicidal ideation, mood changes, confusion, nervousness, sleep disturbance and agitation  Past Medical History: Past Medical History  Diagnosis Date  . Atrial fibrillation   . Long-term (current) use of anticoagulants   . Glaucoma   . Gout   . Hyperthyroidism   . Orthostatic hypotension   . Redundant prepuce and phimosis   . Acute cystitis   . Hypertonicity of bladder   . Bradycardia        . HTN (hypertension)   . Atelectasis   . Dysautonomia   . Pacemaker     St. Jude; device generator replacement 2012  . CHF (congestive heart failure)     Previous preserved EF.  [name] Sleep apnea     does not use cpap not in 4 yrs  . Chronic kidney disease [facility]     UTI  1 MO AGO [place] MEDICALLY    Past Surgical History  Procedure Date  . Appendectomy   . Hernia repair   . Tonsillectomy   . Cardioversion 12/19/2003    cardioversion and pacemaker interrogation  . Elbow bursa surgery 04/07/2006    Right elbow septic olecranon bursitis  . Toe amputation     Osteomyelitis, right foot  second toe /  Right foot second ray amputation  . Pacemaker insertion 07/04/2002  . Insert / replace / remove pacemaker 07/04/2002 dr Graciela Husbands     dual chamber pacemaker - St. Jude model 1688 TC 58 cm active fixation lead was placed into the right  ventricle and the St Jude model 1688 TC 52 cm active fixation pacing lead was placed in the right atrium.  The ventricular lead serial number was [id number] and the atrial lead serial number was [id number]/  Of note, the pacemaker generator was an identity [id number] serial number [id number]   . Eye surgery 01/2005  . Cardiac catheterization 07/03/2002    EF 60-70% /  Normal left ventricular function. / Very mild trivial three-vessel coronary atherosclerosis. / There is no mitral regurgitation noted  . Inguinal hernia repair 08/26/2011    Procedure: HERNIA REPAIR INGUINAL ADULT;  Surgeon: Kandis Cocking, MD;  Location: [facility] OR;  Service: General;  Laterality: Left;  open left inguinal hernia    Medications: Prior to Admission medications   Medication Sig Start Date End Date Taking? Authorizing Provider  acetaminophen (TYLENOL) 500 MG tablet Take 500-1,000 mg by mouth every 6 (six) hours as needed. For pain   Yes Historical Provider, MD  digoxin (LANOXIN) 0.125 MG tablet Take 125 mcg by mouth daily. 11/11/11  Yes Cassell Clement, MD  dorzolamide-timolol (COSOPT) 22.3-6.8 MG/ML ophthalmic solution Place 1 drop into both eyes daily.    Yes Historical Provider, MD  finasteride (PROSCAR) 5 MG tablet Take 5 mg by mouth daily.    Yes Historical Provider, MD  fludrocortisone (FLORINEF) 0.1 MG tablet Take 0.1 mg by mouth every other day.    Yes Historical Provider, MD  Guaifenesin 1200 MG TB12 Take 1 tablet by mouth 2 (two) times daily. 09/01/11  Yes Clydia Llano, MD  levothyroxine (SYNTHROID, LEVOTHROID) 175 MCG tablet Take 175 mcg by mouth daily.   Yes Historical Provider, MD  Multiple Vitamin (MULTIVITAMIN WITH MINERALS) TABS Take 1 tablet by mouth daily.   Yes Historical Provider, MD  pyridostigmine (MESTINON) 60 MG tablet Take 60 mg by mouth 2 (two) times daily. Take one tablet every morning & one tablet at lunch. 04/06/11  Yes Duke Salvia, MD  silodosin (RAPAFLO) 8 MG CAPS capsule Take 8 mg by mouth daily with  lunch.   Yes Historical Provider, MD  warfarin (COUMADIN) 5 MG tablet Take 2.5 mg by mouth daily. Take as directed by coumadin clinic. This is a 90 day supply. 11/23/11  Yes Cassell Clement, MD    Allergies:  No Known Allergies  Social History:  reports that he quit smoking about 46 years ago. He has never used smokeless tobacco. He reports that he does not drink alcohol or use illicit drugs.he lives at home with his wife and uses walker/cane for ambulation   Family History: Family History  Problem Relation Age of Onset  . Cancer Mother 42    GYN    Physical Exam: Blood pressure 152/94, pulse 56, temperature 97.6 F (36.4 C), temperature source Oral, resp. rate 18, height 6\' 3"  (1.905 m), weight 95.255 kg (210 lb), SpO2 92.00%. General: Alert, awake, oriented x3, in in mild respiratory distress due to wheezing and tightness in the chest HEENT: normocephalic, atraumatic, anicteric sclera, pink conjunctiva, pupils equal and reactive to light and accomodation, oropharynx clear Neck: supple, no masses or lymphadenopathy, no goiter, no bruits  Heart: Irregular rhythm, no rubs or gallops. Lungs: Diffuse wheezing bilaterally  Abdomen: Soft, nontender, nondistended,  positive bowel sounds, no masses. Extremities: No clubbing, cyanosis or edema with positive pedal pulses. Neuro: Grossly intact, no focal neurological deficits, strength 5/5 upper and lower extremities bilaterally Psych: alert and oriented x 3, normal mood and affect Skin: no rashes or lesions, warm and dry   LABS on Admission:  Basic Metabolic Panel:  Lab 03/10/12 4098  NA 140  K 4.3  CL 100  CO2 32  GLUCOSE 93  BUN 17  CREATININE 0.82  CALCIUM 9.9  MG --  PHOS --   Liver Function Tests:  Lab 03/10/12 1259  AST 22  ALT 14  ALKPHOS 66  BILITOT 0.9  PROT 8.0  ALBUMIN 4.4   CBC:  Lab 03/10/12 1259  WBC 7.6  NEUTROABS 5.3  HGB 14.7  HCT 43.9  MCV 97.6  PLT 153     Radiological Exams on  Admission: Dg Chest 2 View  03/10/2012  *RADIOLOGY REPORT*  Clinical Data: Shortness of breath and dizziness.  Cough and congestion  CHEST - 2 VIEW  Comparison: Chest CT 08/31/2011, 08/31/2011, and 04/14/2011  Findings: Left chest wall battery pack with pacer leads terminating in the right atrium, right ventricle.  Cardiomegaly is stable. Pulmonary vascularity mildly congested.  Lung volumes slightly low, with chronic slight elevation of the left hemidiaphragm.  Mild subpleural reticulation/interstitial prominence in the lung bases is unchanged.  No focal airspace disease, pulmonary edema or pleural effusion.  The bones are osteopenic.  No acute bony abnormality identified.  IMPRESSION: Stable examination.  Cardiomegaly with mild pulmonary vascular congestion.  Slight stable interstitial prominence at the lung bases.  Original Report Authenticated By: Britta Mccreedy, M.D.    Assessment/Plan   Principal problem:   .SOB (shortness of breath) likely secondary to  acute on chronic CHF with preserved EF on a prior echo in 1/ 2013 CHF, and COPD exacerbation. Patient also has history of obstructive sleep apnea but does not use CPAP. BNP elevated at 1840 - Admit to telemetry, placed on CHF pathway, obtain serial cardiac enzymes, d-dimer. Chest x-ray shows cardiomegaly with mild pulmonary vascular congestion but no focal airspace disease. - Patient did have improvement in his symptoms after Lasix however he is not on Lasix outpatient. Will place him on IV diuresis with Lasix 40mg  q12. He has bradycardia with heart rate in the 50s to 60s, hence will hold off on beta blocker.  - Will recheck 2-D echocardiogram, previous echo in 1/13 showed EF of 55-60% mid normal wall motion, cardiology consult has been obtained (discussed with Dr. Eden Emms) and will follow recommendations. Obtain digitoxin level. - My suspicion for PE is low as patient is on chronic Coumadin and symptoms are more related to CHF and COPD exacerbation,  stat d-dimer is within normal range. - Patient is also on fludrocortisone outpatient for orthostatic hypotension, however given acute CHF exacerbation I will hold off on Florinef. - Will place him on IV steroids, scheduled breathing treatments with albuterol and Atrovent, O2 supplementation, Levaquin, Mucinex.  Active problems :  .OBSTRUCTIVE SLEEP APNEA: Patient does not use the CPAP   .Hypertension: -Hold off on Florinef, continue Lasix.     Marland KitchenPACEMAKER, PERMANENT  .Atrial fibrillation: Currently rate controlled - Continue digoxin, Coumadin per pharmacy  DVT prophylaxis: On therapeutic Coumadin  CODE STATUS: Discussed in detail with the patient and he opts to have full CODE STATUS  Further plan will depend as patient's clinical course evolves and further radiologic and laboratory data become available.   Time Spent on Admission: 1  hour  RAI,RIPUDEEP M.D. [facility] 03/10/2012, 6:48 PM Pager: [phone]  If 7PM-7AM, please contact night-coverage [email or web address] Password [email or web address]

## 2012-03-10 NOTE — Telephone Encounter (Signed)
Spoke with daughter and patient started feeling worse and is now in ED

## 2012-03-11 DIAGNOSIS — E8779 Other fluid overload: Secondary | ICD-10-CM

## 2012-03-11 DIAGNOSIS — J40 Bronchitis, not specified as acute or chronic: Secondary | ICD-10-CM

## 2012-03-11 DIAGNOSIS — I359 Nonrheumatic aortic valve disorder, unspecified: Secondary | ICD-10-CM

## 2012-03-11 DIAGNOSIS — J209 Acute bronchitis, unspecified: Secondary | ICD-10-CM

## 2012-03-11 LAB — BASIC METABOLIC PANEL
BUN: 20 mg/dL (ref 6–23)
CO2: 31 mEq/L (ref 19–32)
Chloride: 95 mEq/L — ABNORMAL LOW (ref 96–112)
Creatinine, Ser: 0.93 mg/dL (ref 0.50–1.35)
Glucose, Bld: 173 mg/dL — ABNORMAL HIGH (ref 70–99)
Potassium: 3.9 mEq/L (ref 3.5–5.1)

## 2012-03-11 LAB — CARDIAC PANEL(CRET KIN+CKTOT+MB+TROPI)
CK, MB: 4.1 ng/mL — ABNORMAL HIGH (ref 0.3–4.0)
Relative Index: INVALID (ref 0.0–2.5)
Troponin I: <0.3 ng/mL (ref <0.30)

## 2012-03-11 LAB — MRSA PCR SCREENING: MRSA by PCR: POSITIVE — AB

## 2012-03-11 LAB — PROTIME-INR
INR: 2 — ABNORMAL HIGH (ref 0.00–1.49)
Prothrombin Time: 23 seconds — ABNORMAL HIGH (ref 11.6–15.2)

## 2012-03-11 MED ORDER — MUPIROCIN 2 % EX OINT
TOPICAL_OINTMENT | Freq: Two times a day (BID) | CUTANEOUS | Status: DC
  Administered 2012-03-11 – 2012-03-12 (×4): via NASAL
  Filled 2012-03-11: qty 22

## 2012-03-11 MED ORDER — WARFARIN SODIUM 3 MG PO TABS
3.0000 mg | ORAL_TABLET | Freq: Once | ORAL | Status: AC
  Administered 2012-03-11: 3 mg via ORAL
  Filled 2012-03-11: qty 1

## 2012-03-11 MED ORDER — LEVOTHYROXINE SODIUM 150 MCG PO TABS
150.0000 ug | ORAL_TABLET | Freq: Every day | ORAL | Status: DC
  Administered 2012-03-11 – 2012-03-12 (×2): 150 ug via ORAL
  Filled 2012-03-11 (×3): qty 1

## 2012-03-11 MED ORDER — LORAZEPAM 0.5 MG PO TABS
1.0000 mg | ORAL_TABLET | Freq: Once | ORAL | Status: AC
  Administered 2012-03-11: 1 mg via ORAL
  Filled 2012-03-11 (×2): qty 1

## 2012-03-11 MED ORDER — GUAIFENESIN 100 MG/5ML PO SYRP
200.0000 mg | ORAL_SOLUTION | ORAL | Status: DC | PRN
  Administered 2012-03-11: 200 mg via ORAL
  Filled 2012-03-11 (×3): qty 118

## 2012-03-11 MED ORDER — CHLORHEXIDINE GLUCONATE CLOTH 2 % EX PADS
6.0000 | MEDICATED_PAD | Freq: Every day | CUTANEOUS | Status: DC
  Administered 2012-03-11: 6 via TOPICAL

## 2012-03-11 MED ORDER — ALBUTEROL SULFATE (5 MG/ML) 0.5% IN NEBU: 2.5000 mg | INHALATION_SOLUTION | Freq: Two times a day (BID) | RESPIRATORY_TRACT | Status: DC

## 2012-03-11 MED ORDER — METHYLPREDNISOLONE SODIUM SUCC 40 MG IJ SOLR
40.0000 mg | Freq: Three times a day (TID) | INTRAMUSCULAR | Status: DC
  Administered 2012-03-11 – 2012-03-12 (×2): 40 mg via INTRAVENOUS
  Filled 2012-03-11 (×6): qty 1

## 2012-03-11 MED ORDER — IPRATROPIUM BROMIDE 0.02 % IN SOLN: 0.5000 mg | Freq: Two times a day (BID) | RESPIRATORY_TRACT | Status: DC

## 2012-03-11 NOTE — Progress Notes (Signed)
Primary cardiologist: Dr. Patty Sermons  Subjective: Feels much better today. No chest pain or breathlessness.   Objective: Blood pressure 128/73, pulse 94, temperature 97.9 F (36.6 C), temperature source Oral, resp. rate 18, height 6\' 3"  (1.905 m), weight 206 lb 11.2 oz (93.759 kg), SpO2 91.00%.  Intake/Output Summary (Last 24 hours) at 03/11/12 1039 Last data filed at 03/11/12 0644  Gross per 24 hour  Intake      0 ml  Output   3175 ml  Net  -3175 ml   Telemetry - AF.  Exam -   General - NAD.  Lungs - Coarse BS, no wheezes.  Cardiac - Irregular, no gallop.  Extremities - No pitting.  Lab Results -  Basic Metabolic Panel:  Lab 03/11/12 8119 03/10/12 2014 03/10/12 1259  NA 137 -- 140  K 3.9 -- 4.3  CL 95* -- 100  CO2 31 -- 32  GLUCOSE 173* -- 93  BUN 20 -- 17  CREATININE 0.93 -- 0.82  CALCIUM 10.1 -- 9.9  MG -- 2.0 --    Liver Function Tests:  Lab 03/10/12 1259  AST 22  ALT 14  ALKPHOS 66  BILITOT 0.9  PROT 8.0  ALBUMIN 4.4    CBC:  Lab 03/10/12 1259  WBC 7.6  HGB 14.7  HCT 43.9  MCV 97.6  PLT 153    Cardiac Enzymes:  Lab 03/11/12 0804 03/11/12 0207 03/10/12 2014  CKTOTAL 75 60 53  CKMB 4.2* 4.1* 3.9  CKMBINDEX -- -- --  TROPONINI <0.30 <0.30 <0.30    BNP:  Basename 03/10/12 1259 08/30/11 0920 08/28/11 1206  PROBNP 1840.0* 615.2* 1283.0*    Coagulation:  Lab 03/11/12 0207 03/10/12 1312  INR 2.00* 1.92*    Current Medications    . albuterol  2.5 mg Nebulization Q6H  . albuterol  5 mg Nebulization Once  . Chlorhexidine Gluconate Cloth  6 each Topical Q0600  . digoxin  125 mcg Oral Daily  . dorzolamide-timolol  1 drop Both Eyes Daily  . finasteride  5 mg Oral Daily  . furosemide  40 mg Intravenous BID  . furosemide  60 mg Intravenous Once  . guaiFENesin  1,200 mg Oral BID  . ipratropium  0.5 mg Nebulization Once  . ipratropium  0.5 mg Nebulization Q6H  . levofloxacin (LEVAQUIN) IV  750 mg Intravenous Q24H  .  levothyroxine  150 mcg Oral QAC breakfast  . LORazepam  1 mg Oral Once  . methylPREDNISolone (SOLU-MEDROL) injection  125 mg Intravenous Once  . methylPREDNISolone (SOLU-MEDROL) injection  40 mg Intravenous Q8H  . multivitamin with minerals  1 tablet Oral Daily  . mupirocin ointment   Nasal BID  . pyridostigmine  60 mg Oral BID WC  . sodium chloride  3 mL Intravenous Q12H  . Tamsulosin HCl  0.4 mg Oral Q lunch  . warfarin  3 mg Oral Once  . Warfarin - Pharmacist Dosing Inpatient   Does not apply q1800  . DISCONTD: levothyroxine  175 mcg Oral QAC breakfast  . DISCONTD: methylPREDNISolone (SOLU-MEDROL) injection  60 mg Intravenous Q6H    Assessment:  1. A/C diastolic CHF, LVEF 55-60% in 1/13. Good diuresis.  2. Bronchitis, per primary team.  3. St. Jude PPM in place.  4. AF, on Coumadin.  5. History of orthostatic hypotension. Florinef stopped by Dr. Eden Emms with concern about volume overload. BP is stable so far. Not orthostatic now.  Plan:  Continue IV Lasix today. May be able to discharge tomorrow  if continues to do well. Would worry about putting him on standing Lasix at home however with history of orthostatic hypotension. Perhaps it could be used at low dose and PRN based on weight.  Jonelle Sidle, M.D., F.A.C.C.

## 2012-03-11 NOTE — Evaluation (Addendum)
Physical Therapy Evaluation Patient Details Name: Marco Gregory MRN: [id number] DOB: [birth year] Today's Date: 03/11/2012 Time: 6213-0865 PT Time Calculation (min): 21 min  PT Assessment / Plan / Recommendation Clinical Impression  Pt. was admitted with SOB, likely acute on chronic CHF, congestion and bronchitis.  He has decreased independence in and tolerance of activity and mobility and will benefit from acute PT in preparation for DC home.  RECOMMEND NURSING STAFF AMBULATE AS MUCH AS POSSIBLE WITH PT. OVER WEEKEND.    PT Assessment  Patient needs continued PT services    Follow Up Recommendations  Home health PT    Barriers to Discharge None pt's bedroom is upstairs, not able to sleep downstaris    Equipment Recommendations  None recommended by PT    Recommendations for Other Services     Frequency Min 3X/week    Precautions / Restrictions Precautions Precautions: Fall Restrictions Weight Bearing Restrictions: No   Pertinent Vitals/Pain No pain, no distress; O2 sars 92% on RA and difficult to obtain due to cold fingers, HR 74     Mobility  Bed Mobility Bed Mobility: Supine to Sit;Sit to Supine Supine to Sit: 6: Modified independent (Device/Increase time);HOB flat;With rails Sit to Supine: Not Tested (comment) Details for Bed Mobility Assistance: Pt. managed bed mobility well and safely Transfers Transfers: Sit to Stand;Stand to Sit Sit to Stand: 6: Modified independent (Device/Increase time);From bed Stand to Sit: 6: Modified independent (Device/Increase time);To bed Details for Transfer Assistance: Use of hands on bed to stabilize, vc's for safety Ambulation/Gait Ambulation/Gait Assistance: 4: Min assist;5: Supervision Ambulation Distance (Feet): 150 Feet Assistive device: Rolling walker;Straight cane Ambulation/Gait Assistance Details: Pt. initially tried use of straight cane with notable unsteadiness and steppage type gait.  With use of RW, pt. with notable  improvement in steadiness, though needed cues to slow down, as gait speed was increased.  Educated pt. to use RW when up.  He phoned his daughter to bring his RW to hospital.  He is advised not to be up with cane due to increased fall risk.[name] He understands and agrees.   Gait Pattern: Step-through pattern;Wide base of support;Trunk flexed;Right steppage;Left steppage Gait velocity: increased with use of RW Stairs: No    Exercises     PT Diagnosis: Difficulty walking;Generalized weakness  PT Problem List: Decreased activity tolerance;Decreased balance;Decreased mobility;Decreased knowledge of use of DME PT Treatment Interventions: DME instruction;Gait training;Stair training;Functional mobility training;Therapeutic activities;Therapeutic exercise;Balance training   PT Goals Acute Rehab PT Goals PT Goal Formulation: With patient Time For Goal Achievement: 03/18/12 Potential to Achieve Goals: Good Pt will Transfer Bed to Chair/Chair to Bed: with modified independence PT Transfer Goal: Bed to Chair/Chair to Bed - Progress: Goal set today Pt will Ambulate: >150 feet;with modified independence PT Goal: Ambulate - Progress: Goal set today Pt will Go Up / Down Stairs: Flight;with rail(s) PT Goal: Up/Down Stairs - Progress: Goal set today  Visit Information  Last PT Received On: 03/11/12 Assistance Needed: +1    Subjective Data  Subjective: They tool off 12 pounds since yesterday Patient Stated Goal: return home and resume normal activity   Prior Functioning  Home Living Lives With: Spouse;Other (Comment) (daughter currently here visiting, will stay as long as neede) Available Help at Discharge: Family;Available 24 hours/day Type of Home: House Home Access: Stairs to enter [employer] of Steps: 2 Entrance Stairs-Rails: Can reach both;Right;Left Home Layout: Two level;Bed/bath upstairs Alternate Level Stairs-Number of Steps: 10 Alternate Level Stairs-Rails:  Right;Left Bathroom Shower/Tub: Walk-in shower;Door  Bathroom Toilet: Handicapped height Bathroom Accessibility: Yes Home Adaptive Equipment: Walker - four wheeled;Straight cane;Built-in shower seat;Wheelchair - manual;Bedside commode/3-in-1 Prior Function Level of Independence: Independent with assistive device(s) Able to Take Stairs?: Yes Driving: Yes Communication Communication: No difficulties    Cognition  Overall Cognitive Status: Appears within functional limits for tasks assessed/performed Arousal/Alertness: Awake/alert Orientation Level: Appears intact for tasks assessed Behavior During Session: [facility] for tasks performed    Extremity/Trunk Assessment Right Upper Extremity Assessment RUE ROM/Strength/Tone: [facility] for tasks assessed Left Upper Extremity Assessment LUE ROM/Strength/Tone: WFL for tasks assessed Right Lower Extremity Assessment RLE ROM/Strength/Tone: Within functional levels RLE Sensation: WFL - Light Touch Left Lower Extremity Assessment LLE ROM/Strength/Tone: Within functional levels LLE Sensation: WFL - Light Touch Trunk Assessment Trunk Assessment: Normal   Balance    End of Session PT - End of Session Equipment Utilized During Treatment: Gait belt Activity Tolerance: Patient tolerated treatment well Patient left: in bed;with call bell/phone within reach Nurse Communication: Mobility status  GP     Ferman Hamming 03/11/2012, 9:39 AM Weldon Picking PT Acute Rehab Services [phone] [place] [phone]

## 2012-03-11 NOTE — Progress Notes (Signed)
ANTICOAGULATION CONSULT NOTE - Initial Consult  Pharmacy Consult for Coumadin Indication: atrial fibrillation  No Known Allergies  Patient Measurements: Height: 6\' 3"  (190.5 cm) Weight: 206 lb 11.2 oz (93.759 kg) (scale B) IBW/kg (Calculated) : 84.5   Vital Signs: Temp: 97.9 F (36.6 C) (08/03 0643) Temp src: Oral (08/03 0643) BP: 128/73 mmHg (08/03 0643) Pulse Rate: 92  (08/03 1043)  Labs:  Basename 03/11/12 0804 03/11/12 0207 03/10/12 2014 03/10/12 1312 03/10/12 1259  HGB -- -- -- -- 14.7  HCT -- -- -- -- 43.9  PLT -- -- -- -- 153  APTT -- -- -- -- --  LABPROT -- 23.0* -- 22.3* --  INR -- 2.00* -- 1.92* --  HEPARINUNFRC -- -- -- -- --  CREATININE -- 0.93 -- -- 0.82  CKTOTAL 75 60 53 -- --  CKMB 4.2* 4.1* 3.9 -- --  TROPONINI <0.30 <0.30 <0.30 -- --    Estimated Creatinine Clearance: 65.6 ml/min (by C-G formula based on Cr of 0.93).   Medical History: Past Medical History  Diagnosis Date  . Long-term (current) use of anticoagulants   . Glaucoma   . Gout   . Hyperthyroidism   . Orthostatic hypotension   . Redundant prepuce and phimosis   . Acute cystitis   . Hypertonicity of bladder   . HTN (hypertension)   . Atelectasis   . Dysautonomia   . Pacemaker     St. Jude; device generator replacement 2012  . CHF (congestive heart failure)     Previous preserved EF.  [name] Atrial fibrillation   . Bradyarrhythmia     bradycardia  . Pneumonia 08/2011    after hernia OR  . Hypothyroidism   . Sleep apnea     does not use cpap ; last used ~ 2011 (03/10/12)  . Spinal stenosis of lumbar region   . Chronic kidney disease [facility]     UTI  1 MO AGO TX MEDICALLY   . Osteomyelitis of toe of right foot ~ 2009    amputated 2nd toe    Assessment: 33 YOM with hx of afib on chronic coumadin admitted for worsening sob. Pharmacy is consulted to manage coumadin during hospitalization. Patient home dose is 2.5mg  daily per med history. INR 2.0 today. H/H/Plts wnl. Also  noted levaquin was started.  Goal of Therapy:  INR 2-3 Monitor platelets by anticoagulation protocol: Yes   Plan:  - Coumadin 3mg  po x 1  - daily PT/INR with am labs.  Talbert Cage, PharmD Clinical Pharmacist  Pager: [phone]  03/11/2012,1:26 PM

## 2012-03-11 NOTE — Progress Notes (Signed)
Patient ID: Marco Gregory  male  ZOX:[id number]    DOB: 03-Mar-1921    DOA: 03/10/2012  PCP: Cassell Clement, MD  Subjective: Significantly improved today, shortness of breath and wheezing improving, no chest tightness, nausea/ vomiting or fever   Objective: Weight change:   Intake/Output Summary (Last 24 hours) at 03/11/12 0928 Last data filed at 03/11/12 0644  Gross per 24 hour  Intake      0 ml  Output   3175 ml  Net  -3175 ml   Blood pressure 128/73, pulse 94, temperature 97.9 F (36.6 C), temperature source Oral, resp. rate 18, height 6\' 3"  (1.905 m), weight 93.759 kg (206 lb 11.2 oz), SpO2 91.00%.  Physical Exam: General: Alert and awake, oriented x3, not in any acute distress. HEENT: anicteric sclera, pupils reactive to light and accommodation, EOMI CVS: S1-S2 clear, no murmur rubs or gallops Chest: Mild scattered wheezing significantly improved Abdomen: soft nontender, nondistended, normal bowel sounds, no organomegaly Extremities: no cyanosis, clubbing or edema noted bilaterally Neuro: Cranial nerves II-XII intact, no focal neurological deficits  Lab Results: Basic Metabolic Panel:  Lab 03/11/12 8119 03/10/12 2014 03/10/12 1259  NA 137 -- 140  K 3.9 -- 4.3  CL 95* -- 100  CO2 31 -- 32  GLUCOSE 173* -- 93  BUN 20 -- 17  CREATININE 0.93 -- 0.82  CALCIUM 10.1 -- 9.9  MG -- 2.0 --  PHOS -- -- --   Liver Function Tests:  Lab 03/10/12 1259  AST 22  ALT 14  ALKPHOS 66  BILITOT 0.9  PROT 8.0  ALBUMIN 4.4   No results found for this basename: LIPASE:2,AMYLASE:2 in the last 168 hours No results found for this basename: AMMONIA:2 in the last 168 hours CBC:  Lab 03/10/12 1259  WBC 7.6  NEUTROABS 5.3  HGB 14.7  HCT 43.9  MCV 97.6  PLT 153   Cardiac Enzymes:  Lab 03/11/12 0804 03/11/12 0207 03/10/12 2014  CKTOTAL 75 60 53  CKMB 4.2* 4.1* 3.9  CKMBINDEX -- -- --  TROPONINI <0.30 <0.30 <0.30   BNP: No components found with this basename:  POCBNP:2 CBG: No results found for this basename: GLUCAP:5 in the last 168 hours   Micro Results: Recent Results (from the past 240 hour(s))  MRSA PCR SCREENING     Status: Abnormal   Collection Time   03/10/12  7:57 PM      Component Value Range Status Comment   MRSA by PCR POSITIVE (*) NEGATIVE Final     Studies/Results: Dg Chest 2 View  03/10/2012  *RADIOLOGY REPORT*  Clinical Data: Shortness of breath and dizziness.  Cough and congestion  CHEST - 2 VIEW  Comparison: Chest CT 08/31/2011, 08/31/2011, and 04/14/2011  Findings: Left chest wall battery pack with pacer leads terminating in the right atrium, right ventricle.  Cardiomegaly is stable. Pulmonary vascularity mildly congested.  Lung volumes slightly low, with chronic slight elevation of the left hemidiaphragm.  Mild subpleural reticulation/interstitial prominence in the lung bases is unchanged.  No focal airspace disease, pulmonary edema or pleural effusion.  The bones are osteopenic.  No acute bony abnormality identified.  IMPRESSION: Stable examination.  Cardiomegaly with mild pulmonary vascular congestion.  Slight stable interstitial prominence at the lung bases.  Original Report Authenticated By: Britta Mccreedy, M.D.    Medications: Scheduled Meds:   . albuterol  2.5 mg Nebulization Q6H  . albuterol  5 mg Nebulization Once  . Chlorhexidine Gluconate Cloth  6 each Topical Q0600  .  digoxin  125 mcg Oral Daily  . dorzolamide-timolol  1 drop Both Eyes Daily  . finasteride  5 mg Oral Daily  . furosemide  40 mg Intravenous BID  . furosemide  60 mg Intravenous Once  . guaiFENesin  1,200 mg Oral BID  . ipratropium  0.5 mg Nebulization Once  . ipratropium  0.5 mg Nebulization Q6H  . levofloxacin (LEVAQUIN) IV  750 mg Intravenous Q24H  . levothyroxine  150 mcg Oral QAC breakfast  . LORazepam  1 mg Oral Once  . methylPREDNISolone (SOLU-MEDROL) injection  125 mg Intravenous Once  . methylPREDNISolone (SOLU-MEDROL) injection  40 mg  Intravenous Q8H  . multivitamin with minerals  1 tablet Oral Daily  . mupirocin ointment   Nasal BID  . pyridostigmine  60 mg Oral BID WC  . sodium chloride  3 mL Intravenous Q12H  . Tamsulosin HCl  0.4 mg Oral Q lunch  . warfarin  3 mg Oral Once  . Warfarin - Pharmacist Dosing Inpatient   Does not apply q1800  . DISCONTD: levothyroxine  175 mcg Oral QAC breakfast  . DISCONTD: methylPREDNISolone (SOLU-MEDROL) injection  60 mg Intravenous Q6H   Continuous Infusions:    Assessment/Plan:   Principal problem:  .SOB (shortness of breath) likely secondary to acute on chronic CHF with preserved EF on a prior echo in 1/ 2013 CHF, and COPD exacerbation. Patient also has history of obstructive sleep apnea but does not use CPAP. BNP elevated at 1840 on admission: Significantly improved today - Serial cardiac enzymes negative for ACS  - Continue IV Lasix today, will transition him to oral Lasix either in a.m. or per cardiology recommendations. - Not on beta blocker due to pulmonary complement, had bradycardia on admission.  - 2-D echo pending. DC'd Florinef.  Acute bronchitis/COPD exacerbation: Patient does not have a formal COPD diagnosis however he had smoking history - Taper IV steroids, continue scheduled breathing treatments with albuterol and Atrovent, O2 supplementation, Levaquin, Mucinex. Will transition to oral prednisone tomorrow  Active problems :  .OBSTRUCTIVE SLEEP APNEA: Patient does not use the CPAP   .Hypertension: Stable -Hold off on Florinef, continue Lasix.   Marland KitchenPACEMAKER, PERMANENT   .Atrial fibrillation: Currently rate controlled  - Continue digoxin, Coumadin per pharmacy, INR therapeutic   DVT prophylaxis: On therapeutic Coumadin   Code Status: Full code  Disposition: Hopefully tomorrow if okay with cardiology.   LOS: 1 day   Blakely Maranan M.D. [facility] 03/11/2012, 9:28 AM Pager: [phone]  If 7PM-7AM, please contact  night-coverage [email or web address] Password [email or web address]

## 2012-03-12 DIAGNOSIS — J9819 Other pulmonary collapse: Secondary | ICD-10-CM

## 2012-03-12 LAB — BASIC METABOLIC PANEL
Calcium: 9.8 mg/dL (ref 8.4–10.5)
GFR calc Af Amer: 69 mL/min — ABNORMAL LOW (ref >90)
GFR calc non Af Amer: 60 mL/min — ABNORMAL LOW (ref >90)
Glucose, Bld: 185 mg/dL — ABNORMAL HIGH (ref 70–99)
Potassium: 3.9 mEq/L (ref 3.5–5.1)
Sodium: 138 mEq/L (ref 135–145)

## 2012-03-12 LAB — PROTIME-INR
INR: 2.24 — ABNORMAL HIGH (ref 0.00–1.49)
Prothrombin Time: 25.2 seconds — ABNORMAL HIGH (ref 11.6–15.2)

## 2012-03-12 MED ORDER — LEVOTHYROXINE SODIUM 150 MCG PO TABS: 150.0000 ug | ORAL_TABLET | Freq: Every day | ORAL | Status: DC

## 2012-03-12 MED ORDER — FUROSEMIDE 20 MG PO TABS: 20.0000 mg | ORAL_TABLET | Freq: Every day | ORAL | Status: DC | PRN

## 2012-03-12 MED ORDER — ALBUTEROL SULFATE HFA 108 (90 BASE) MCG/ACT IN AERS: 1.0000 | INHALATION_SPRAY | Freq: Two times a day (BID) | RESPIRATORY_TRACT | Status: DC

## 2012-03-12 MED ORDER — PREDNISONE (PAK) 10 MG PO TABS: 10.0000 mg | ORAL_TABLET | Freq: Every day | ORAL | Status: DC

## 2012-03-12 MED ORDER — WARFARIN SODIUM 2.5 MG PO TABS
2.5000 mg | ORAL_TABLET | Freq: Once | ORAL | Status: DC
  Filled 2012-03-12: qty 1

## 2012-03-12 MED ORDER — ALBUTEROL SULFATE HFA 108 (90 BASE) MCG/ACT IN AERS
1.0000 | INHALATION_SPRAY | Freq: Two times a day (BID) | RESPIRATORY_TRACT | Status: DC
  Administered 2012-03-12: 1 via RESPIRATORY_TRACT
  Filled 2012-03-12: qty 6.7

## 2012-03-12 MED ORDER — PREDNISONE (PAK) 10 MG PO TABS: ORAL_TABLET | ORAL | Status: DC

## 2012-03-12 MED ORDER — LEVOFLOXACIN 750 MG PO TABS
750.0000 mg | ORAL_TABLET | Freq: Every day | ORAL | Status: DC
  Administered 2012-03-12: 750 mg via ORAL
  Filled 2012-03-12 (×2): qty 1

## 2012-03-12 MED ORDER — LEVOFLOXACIN 750 MG PO TABS: 750.0000 mg | ORAL_TABLET | Freq: Every day | ORAL | Status: DC

## 2012-03-12 MED ORDER — PREDNISONE 50 MG PO TABS
60.0000 mg | ORAL_TABLET | Freq: Once | ORAL | Status: AC
  Administered 2012-03-12: 60 mg via ORAL
  Filled 2012-03-12: qty 1

## 2012-03-12 NOTE — Progress Notes (Signed)
Client with family were given CHF teaching along with discharge instructions.  Family verbalized understanding of discharge instructions.  Client is HOH, so instructions were given when daughters were with him.  June is daughter returned for RX since they were printed out and were left here.  All in good spirits and receptive of education.

## 2012-03-12 NOTE — Progress Notes (Signed)
ANTICOAGULATION CONSULT NOTE - Initial Consult  Pharmacy Consult for Coumadin Indication: atrial fibrillation  No Known Allergies  Patient Measurements: Height: 6\' 3"  (190.5 cm) Weight: 206 lb 3.2 oz (93.532 kg) (Scale B.) IBW/kg (Calculated) : 84.5   Vital Signs: Temp: 97.1 F (36.2 C) (08/04 0722) Temp src: Oral (08/04 0722) BP: 110/68 mmHg (08/04 0722) Pulse Rate: 92  (08/04 1053)  Labs:  Marco Gregory 03/12/12 1610 03/11/12 0804 03/11/12 0207 03/10/12 2014 03/10/12 1312 03/10/12 1259  HGB -- -- -- -- -- 14.7  HCT -- -- -- -- -- 43.9  PLT -- -- -- -- -- 153  APTT -- -- -- -- -- --  LABPROT 25.2* -- 23.0* -- 22.3* --  INR 2.24* -- 2.00* -- 1.92* --  HEPARINUNFRC -- -- -- -- -- --  CREATININE 1.07 -- 0.93 -- -- 0.82  CKTOTAL -- 75 60 53 -- --  CKMB -- 4.2* 4.1* 3.9 -- --  TROPONINI -- <0.30 <0.30 <0.30 -- --    Estimated Creatinine Clearance: 57 ml/min (by C-G formula based on Cr of 1.07).   Medical History: Past Medical History  Diagnosis Date  . Long-term (current) use of anticoagulants   . Glaucoma   . Gout   . Hyperthyroidism   . Orthostatic hypotension   . Redundant prepuce and phimosis   . Acute cystitis   . Hypertonicity of bladder   . HTN (hypertension)   . Atelectasis   . Dysautonomia   . Pacemaker     St. Jude; device generator replacement 2012  . CHF (congestive heart failure)     Previous preserved EF.  [name] Atrial fibrillation   . Bradyarrhythmia     bradycardia  . Pneumonia 08/2011    after hernia OR  . Hypothyroidism   . Sleep apnea     does not use cpap ; last used ~ 2011 (03/10/12)  . Spinal stenosis of lumbar region   . Chronic kidney disease [facility]     UTI  1 MO AGO TX MEDICALLY   . Osteomyelitis of toe of right foot ~ 2009    amputated 2nd toe    Assessment: 86 YOM with hx of afib on chronic coumadin admitted for worsening sob. Pharmacy is consulted to manage coumadin during hospitalization. Patient home dose is 2.5mg  daily  per med history. INR 2.24 today. H/H/Plts wnl. Also noted levaquin was started.  Goal of Therapy:  INR 2-3 Monitor platelets by anticoagulation protocol: Yes   Plan:  - Coumadin 2.5mg  po x 1  - daily PT/INR with am labs.  Talbert Cage, PharmD Clinical Pharmacist  Pager: [phone]  03/12/2012,11:32 AM

## 2012-03-12 NOTE — Progress Notes (Signed)
   CARE MANAGEMENT NOTE 03/12/2012  Patient:  Marco Gregory, Marco Gregory   Account Number:  [id number]  Date Initiated:  03/12/2012  Documentation initiated by:  [facility]  Subjective/Objective Assessment:   CHF, bronchitis     Action/Plan:   Anticipated DC Date:  03/12/2012   Anticipated DC Plan:  HOME W HOME HEALTH SERVICES      DC Planning Services  CM consult      [facility] Choice  HOME HEALTH   Choice offered to / List presented to:  C-1 Patient        HH arranged  HH-2 PT  HH-1 RN      [facility] agency  [facility]   Status of service:  Completed, signed off Medicare Important Message given?   (If response is "NO", the following Medicare IM given date fields will be blank) Date Medicare IM given:   Date Additional Medicare IM given:    Discharge Disposition:  HOME W HOME HEALTH SERVICES  Per UR Regulation:    If discussed at Long Length of Stay Meetings, dates discussed:    Comments:  03/12/2012 0930 NCM offered choice for HH. Pt states he has used [facility] in the past and requested their services. Contacted [facility] for [facility] RN and PT for scheduled d/c home today. Entered in TLC. Isidoro Donning RN CCM Case Mgmt phone [phone]

## 2012-03-12 NOTE — Progress Notes (Signed)
Primary cardiologist: Dr. Patty Sermons  Subjective: Feels much better. No chest pain or dyspnea at rest.   Objective: Blood pressure 110/68, pulse 90, temperature 97.1 F (36.2 C), temperature source Oral, resp. rate 20, height 6\' 3"  (1.905 m), weight 206 lb 3.2 oz (93.532 kg), SpO2 92.00%.  Intake/Output Summary (Last 24 hours) at 03/12/12 0932 Last data filed at 03/12/12 0841  Gross per 24 hour  Intake   1320 ml  Output   1850 ml  Net   -530 ml    Telemetry - AF  Exam -   General - NAD.   Lungs - Coarse BS, no wheezes.   Cardiac - Irregular, no gallop.   Extremities - No pitting.   Lab Results -  Basic Metabolic Panel:  Lab 03/12/12 4540 03/11/12 0207 03/10/12 2014 03/10/12 1259  NA 138 137 -- 140  K 3.9 3.9 -- 4.3  CL 97 95* -- 100  CO2 32 31 -- 32  GLUCOSE 185* 173* -- 93  BUN 37* 20 -- 17  CREATININE 1.07 0.93 -- 0.82  CALCIUM 9.8 10.1 -- 9.9  MG -- -- 2.0 --    Liver Function Tests:  Lab 03/10/12 1259  AST 22  ALT 14  ALKPHOS 66  BILITOT 0.9  PROT 8.0  ALBUMIN 4.4    CBC:  Lab 03/10/12 1259  WBC 7.6  HGB 14.7  HCT 43.9  MCV 97.6  PLT 153    Cardiac Enzymes:  Lab 03/11/12 0804 03/11/12 0207 03/10/12 2014  CKTOTAL 75 60 53  CKMB 4.2* 4.1* 3.9  CKMBINDEX -- -- --  TROPONINI <0.30 <0.30 <0.30    BNP:  Basename 03/10/12 1259 08/30/11 0920 08/28/11 1206  PROBNP 1840.0* 615.2* 1283.0*    Coagulation:  Lab 03/12/12 0638 03/11/12 0207 03/10/12 1312  INR 2.24* 2.00* 1.92*   Echocardiogram 8/3 Study Conclusions  - Left ventricle: The cavity size was normal. Wall thickness was increased in a pattern of mild LVH. Systolic function was vigorous. The estimated ejection fraction was in the range of 65% to 70%. Wall motion was normal; there were no regional wall motion abnormalities. - Aortic valve: There was mild stenosis. Mild to moderate regurgitation. Valve area: 1.56cm^2(VTI). Valve area: 1.46cm^2 (Vmax). - Mitral valve:  Calcified annulus. Mild regurgitation. - Left atrium: The atrium was mildly dilated. - Right atrium: The atrium was mildly to moderately dilated. - Pulmonary arteries: PA peak pressure: 47mm Hg (S).   Current Medications    . albuterol  1 puff Inhalation BID  . Chlorhexidine Gluconate Cloth  6 each Topical Q0600  . digoxin  125 mcg Oral Daily  . dorzolamide-timolol  1 drop Both Eyes Daily  . finasteride  5 mg Oral Daily  . furosemide  40 mg Intravenous BID  . guaiFENesin  1,200 mg Oral BID  . levofloxacin  750 mg Oral Daily  . levothyroxine  150 mcg Oral QAC breakfast  . multivitamin with minerals  1 tablet Oral Daily  . mupirocin ointment   Nasal BID  . predniSONE  60 mg Oral Once  . pyridostigmine  60 mg Oral BID WC  . sodium chloride  3 mL Intravenous Q12H  . Tamsulosin HCl  0.4 mg Oral Q lunch  . warfarin  3 mg Oral ONCE-1800  . Warfarin - Pharmacist Dosing Inpatient   Does not apply q1800  . DISCONTD: albuterol  2.5 mg Nebulization Q6H  . DISCONTD: albuterol  2.5 mg Nebulization BID  . DISCONTD: ipratropium  0.5  mg Nebulization Q6H  . DISCONTD: ipratropium  0.5 mg Nebulization BID  . DISCONTD: levofloxacin (LEVAQUIN) IV  750 mg Intravenous Q24H  . DISCONTD: methylPREDNISolone (SOLU-MEDROL) injection  40 mg Intravenous Q8H    Assessment:  1. A/C diastolic CHF, LVEF 55-60% in 1/13. Good diuresis. Feels much better.  2. Bronchitis, per primary team.   3. St. Jude PPM in place.   4. AF, on Coumadin.   5. History of orthostatic hypotension. Florinef stopped by Dr. Eden Emms with concern about volume overload. BP is stable so far. Not orthostatic now.  Plan:  Likely OK for discharge. Would use Lasix 20 mg PRN for weight gain 2-3 pounds in 24 hours. Might need standing dose eventually, but would try to avoid for now with history of orthostatic hypotension. Should f/u with Dr. Patty Sermons in approximately one week.  Jonelle Sidle, M.D., F.A.C.C.

## 2012-03-12 NOTE — Discharge Summary (Signed)
Physician Discharge Summary  Patient ID: GLENWOOD REVOIR MRN: [id number] DOB/AGE: [birth year] 78 y.o.  Admit date: 03/10/2012 Discharge date: 03/12/2012  Primary Care Physician:  Cassell Clement, MD  Discharge Diagnoses:    .SOB (shortness of breath) .CHF, acute on chronic .COPD exacerbation .OBSTRUCTIVE SLEEP APNEA .Hypertension .PACEMAKER, PERMANENT .Atrial fibrillation  Consults: [facility]   Discharge Medications: Medication List  As of 03/12/2012 10:08 AM   STOP taking these medications         fludrocortisone 0.1 MG tablet         TAKE these medications         acetaminophen 500 MG tablet   Commonly known as: TYLENOL   Take 500-1,000 mg by mouth every 6 (six) hours as needed. For pain      albuterol 108 (90 BASE) MCG/ACT inhaler   Commonly known as: PROVENTIL HFA;VENTOLIN HFA   Inhale 1 puff into the lungs 2 (two) times daily. X 3 days, then continue as needed for shortness of breath or wheezing      digoxin 0.125 MG tablet   Commonly known as: LANOXIN   Take 125 mcg by mouth daily.      dorzolamide-timolol 22.3-6.8 MG/ML ophthalmic solution   Commonly known as: COSOPT   Place 1 drop into both eyes daily.      finasteride 5 MG tablet   Commonly known as: PROSCAR   Take 5 mg by mouth daily.      furosemide 20 MG tablet   Commonly known as: LASIX   Take 1 tablet (20 mg total) by mouth daily as needed (for shortness of breath, leg swelling, or weight increase by 3lbs).      Guaifenesin 1200 MG Tb12   Take 1 tablet by mouth 2 (two) times daily.      levofloxacin 750 MG tablet   Commonly known as: LEVAQUIN   Take 1 tablet (750 mg total) by mouth daily.      levothyroxine 150 MCG tablet   Commonly known as: SYNTHROID, LEVOTHROID   Take 1 tablet (150 mcg total) by mouth daily before breakfast.      multivitamin with minerals Tabs   Take 1 tablet by mouth daily.      predniSONE 10 MG tablet   Commonly known as: STERAPRED UNI-PAK   Prednisone dosing: Take  Prednisone 40mg  (4 tabs) x 3 days, then taper to 30mg  (3 tabs) x 3 days, then 20mg  (2 tabs) x 3days, then 10mg  (1 tab) x 3days, then OFF.    Dispense: 30 tabs (10mg  each), refills: None      pyridostigmine 60 MG tablet   Commonly known as: MESTINON   Take 60 mg by mouth 2 (two) times daily. Take one tablet every morning & one tablet at lunch.      silodosin 8 MG Caps capsule   Commonly known as: RAPAFLO   Take 8 mg by mouth daily with lunch.      warfarin 5 MG tablet   Commonly known as: COUMADIN   Take 2.5 mg by mouth daily. Take as directed by coumadin clinic. This is a 90 day supply.             Brief H and P: For complete details please refer to admission H and P, but in brief Patient is 78 year old male with history of atrial fibrillation on Coumadin, hypertension with orthostatic hypotension, tachycardia bradycardia syndrome with permanent pacemaker, BPH, history of CHF with preserved EF, obstructive sleep apnea (does not use  CPAP) presented to [facility] ED with progressively worsening shortness of breath especially on lying flat. Per patient he saw Dr. Patty Sermons in office a week ago and was doing well, however the next day he started having some shortness of breath and coughing. Patient told that it progressively continued to worsen and he was unable to lie flat, has been trying to sleep on recliner. He was having yellowish productive phlegm but no fevers or chills. He did admit to drinking a lot of water recently as he was feeling some dizziness. Patient also had significant wheezing but denied any formal diagnosis of COPD, he's not on any inhalers or nebulizers, he's not on oxygen at home. Patient received Lasix in the emergency room, 60 mg x1, and patient had about a liter of urine output, and stated that his symptoms seemed better after Lasix   Hospital Course:   SOB (shortness of breath) was likely secondary to acute on chronic CHF with preserved EF  on a prior echo in 1/ 2013 CHF, and COPD exacerbation. Patient also has history of obstructive sleep apnea but does not use CPAP. BNP was elevated at 1840 on admission. Patient was admitted to the telemetry floor, serial cardiac enzymes remain negative for ACS. He was placed on IV Lasix and [facility] was consulted. He was not placed on beta blocker due to pulmonary complement and acute bronchitis, had bradycardia on admission. Patient was also noticed to be on Florinef for orthostatic hypotension which was discontinued. 2-D echocardiogram was done which showed EF of 65-70%, no wall motion abnormalities. Patient had significant symptomatic improvement after IV diuresis and 4lbs weight loss from admission (210->206lbs). Due to his history of orthostatic hypotension, it was recommended that he uses Lasix oral 20 mg as needed.   Acute bronchitis/COPD exacerbation: Patient does not have a formal COPD diagnosis however he had smoking history. He was placed on scheduled nebs, O2 supplementation, Levaquin, Mucinex and IV steroids. Patient was transitioned to oral prednisone and antibiotic, was provided with albuterol inhaler. He will continue steroid taper and antibiotics, home health was set up for PT and RN followup.  .OBSTRUCTIVE SLEEP APNEA: Patient does not use the CPAP   .Hypertension: Remained stable    .PACEMAKER, PERMANENT : Follows Dr. Alberteen Spindle  .Atrial fibrillation: Remained rate controlled and patient was continued on digoxin and Coumadin. INR is therapeutic at 2.24 the time of discharge.  Day of Discharge BP 110/68  Pulse 90  Temp 97.1 F (36.2 C) (Oral)  Resp 20  Ht 6\' 3"  (1.905 m)  Wt 93.532 kg (206 lb 3.2 oz)  BMI 25.77 kg/m2  SpO2 92%  Physical Exam: General: Alert and awake oriented x3 not in any acute distress. HEENT: anicteric sclera, pupils reactive to light and accommodation CVS: S1-S2 clear no murmur rubs or gallops Chest: clear to auscultation bilaterally, no wheezing  rales or rhonchi Abdomen: soft nontender, nondistended, normal bowel sounds, no organomegaly Extremities: no cyanosis, clubbing or edema noted bilaterally Neuro: Cranial nerves II-XII intact, no focal neurological deficits   The results of significant diagnostics from this hospitalization (including imaging, microbiology, ancillary and laboratory) are listed below for reference.    LAB RESULTS: Basic Metabolic Panel:  Lab 03/12/12 1610 03/11/12 0207 03/10/12 2014  NA 138 137 --  K 3.9 3.9 --  CL 97 95* --  CO2 32 31 --  GLUCOSE 185* 173* --  BUN 37* 20 --  CREATININE 1.07 0.93 --  CALCIUM 9.8 10.1 --  MG -- --  2.0  PHOS -- -- --   Liver Function Tests:  Lab 03/10/12 1259  AST 22  ALT 14  ALKPHOS 66  BILITOT 0.9  PROT 8.0  ALBUMIN 4.4  CBC:  Lab 03/10/12 1259  WBC 7.6  NEUTROABS 5.3  HGB 14.7  HCT 43.9  MCV 97.6  PLT 153   Cardiac Enzymes:  Lab 03/11/12 0804 03/11/12 0207  CKTOTAL 75 60  CKMB 4.2* 4.1*  CKMBINDEX -- --  TROPONINI <0.30 <0.30   BNP: No components found with this basename: POCBNP:2 CBG: No results found for this basename: GLUCAP:2 in the last 168 hours  Significant Diagnostic Studies:  No results found.   Disposition and Follow-up: Discharge Orders    Future Appointments: Provider: Department: Dept Phone: Center:   03/16/2012 11:45 AM [facility] [phone] None   05/09/2012 10:00 AM Duke Salvia, MD [facility] [phone] [facility]   06/08/2012 11:15 AM [facility] [phone] [facility]   06/08/2012 11:30 AM Cassell Clement, MD [facility] [phone] None     Future Orders Please Complete By Expires   Diet - low sodium heart healthy      Increase activity slowly      (HEART FAILURE PATIENTS) Call MD:  Anytime you have any of the following symptoms: 1) 3 pound weight gain in 24 hours or 5 pounds in 1 week 2) shortness of breath, with or without a dry  hacking cough 3) swelling in the hands, feet or stomach 4) if you have to sleep on extra pillows at night in order to breathe.      Discharge instructions      Comments:   1) Please monitor your weight atleast every 3 days. If increased by 3lbs, can take lasix 20mg , follow heart healthy, low salt diet.   2) Please get TSH checked in 4-6 weeks, your dose is decreased to 19mcg/daily       DISPOSITION: Home  DIET: Heart healthy diet ACTIVITY: As tolerated   DISCHARGE FOLLOW-UP Follow-up Information    Follow up with Cassell Clement, MD. Schedule an appointment as soon as possible for a visit in 1 week. (for follow-up)    Contact information:   [address] [place] [address] [phone]       Follow up with [facility]. (Home Health RN and Physical Therapy)    Contact information:   [phone]         Time spent on Discharge: 45 minutes  Signed:   Asuncion Shibata M.D. [facility] 03/12/2012, 10:08 AM Pager: [phone]  If 7PM-7AM, please contact night-coverage [email or web address] Password [email or web address]

## 2012-03-13 DIAGNOSIS — I4891 Unspecified atrial fibrillation: Secondary | ICD-10-CM

## 2012-03-13 NOTE — Telephone Encounter (Signed)
F/u   Pt calling for f/u status

## 2012-03-13 NOTE — Telephone Encounter (Signed)
F/u   Please return call to patient daughter [phone] or [phone], to f/u with daughter regarding previous requests from Friday

## 2012-03-13 NOTE — Telephone Encounter (Signed)
Follow up appt. Scheduled and advised patient

## 2012-03-15 ENCOUNTER — Telehealth: Payer: Self-pay | Admitting: Cardiology

## 2012-03-15 DIAGNOSIS — R05 Cough: Secondary | ICD-10-CM

## 2012-03-15 NOTE — Telephone Encounter (Signed)
Add ZPAK  As directed.  Continue usual dose coumadin ---last INR 2.24

## 2012-03-15 NOTE — Telephone Encounter (Signed)
Spoke with Crystal and she states patient says he is coughing up a lot of brown colored sputum.  When she listened to his lungs she did hear some congestion.  Patient denies any shortness of breath.  Per Crystal all vital signs were WNL.  Patient did get OTC Robitusin and it is helping some.  Will forward to  Dr. Patty Sermons for review

## 2012-03-15 NOTE — Telephone Encounter (Signed)
Called number taken and it was a pager number.  Called [facility] and got correct number [phone]).  Left message to call back

## 2012-03-15 NOTE — Telephone Encounter (Signed)
New problem   Has general question. Would like to give him a call

## 2012-03-15 NOTE — Telephone Encounter (Signed)
Spoke with patient regarding appointment time

## 2012-03-15 NOTE — Telephone Encounter (Signed)
New problem;  Per Crystal calling . C/O brown sputum since yesterday x 5 day. Vital signs stable. bronchic  through out upper & lower.

## 2012-03-16 NOTE — Telephone Encounter (Signed)
Discussed with  Dr. Patty Sermons and patient is taking Levaquin so he will just continue and keep his appointment tomorrow.  Left message for Crystal to call back

## 2012-03-16 NOTE — Telephone Encounter (Signed)
Advised patient and Crystal

## 2012-03-17 ENCOUNTER — Ambulatory Visit (INDEPENDENT_AMBULATORY_CARE_PROVIDER_SITE_OTHER): Payer: Medicare Other | Admitting: Cardiology

## 2012-03-17 ENCOUNTER — Encounter: Payer: Self-pay | Admitting: Cardiology

## 2012-03-17 ENCOUNTER — Ambulatory Visit (INDEPENDENT_AMBULATORY_CARE_PROVIDER_SITE_OTHER): Payer: Medicare Other | Admitting: Pharmacist

## 2012-03-17 VITALS — BP 118/80 | HR 75 | Ht 75.0 in | Wt 210.0 lb

## 2012-03-17 DIAGNOSIS — R42 Dizziness and giddiness: Secondary | ICD-10-CM

## 2012-03-17 DIAGNOSIS — I4891 Unspecified atrial fibrillation: Secondary | ICD-10-CM

## 2012-03-17 DIAGNOSIS — J449 Chronic obstructive pulmonary disease, unspecified: Secondary | ICD-10-CM

## 2012-03-17 DIAGNOSIS — J209 Acute bronchitis, unspecified: Secondary | ICD-10-CM

## 2012-03-17 DIAGNOSIS — Q078 Other specified congenital malformations of nervous system: Secondary | ICD-10-CM

## 2012-03-17 LAB — POCT INR: INR: 4

## 2012-03-17 NOTE — Assessment & Plan Note (Signed)
His acute asthmatic bronchitis has improved on Levaquin.  The Levaquin may have caused him some tendon discomfort in his legs but he has finished the course of antibiotics today.

## 2012-03-17 NOTE — Patient Instructions (Signed)
Your physician recommends that you continue on your current medications as directed. Please refer to the Current Medication list given to you today.  Your physician recommends that you schedule a follow-up appointment in: 3 month  

## 2012-03-17 NOTE — Progress Notes (Signed)
Marco Gregory Date of Birth:  [birth year] [facility] [address] [place], [place]  [address] [phone]         Fax   [phone]  History of Present Illness: This pleasant 78 year old gentleman is seen for a scheduled followup office visit.  He has a past history of autonomic dysfunction with orthostatic hypotension.  He was recently hospitalized with congestive heart failure responsive to Lasix.  He was also felt to have an exacerbation of his COPD and was sent home on a tapering dose of steroids and on Levaquin which he has now finished.  In the hospital he states that he diuresed 14 pounds in 2 days and his orthopnea and dyspnea improved significantly.  He has also been having a sensation of excessive phlegm in his throat which is still present and for which she will be seeing an ENT specialist Dr. Jenne Pane.  The patient has not been having any chest pain or angina.  He is still having some orthopnea and sleeps better in a chair then in the bed.  Complains of weakness and still is having problems with sensation of orthostatic dizziness.  Current Outpatient Prescriptions  Medication Sig Dispense Refill  . acetaminophen (TYLENOL) 500 MG tablet Take 500-1,000 mg by mouth every 6 (six) hours as needed. For pain      . albuterol (PROVENTIL HFA;VENTOLIN HFA) 108 (90 BASE) MCG/ACT inhaler Inhale 1 puff into the lungs 2 (two) times daily. X 3 days, then continue as needed for shortness of breath or wheezing  1 Inhaler  3  . digoxin (LANOXIN) 0.125 MG tablet Take 125 mcg by mouth daily.      . dorzolamide-timolol (COSOPT) 22.3-6.8 MG/ML ophthalmic solution Place 1 drop into both eyes daily.       . finasteride (PROSCAR) 5 MG tablet Take 5 mg by mouth daily.       . furosemide (LASIX) 20 MG tablet Take 1 tablet (20 mg total) by mouth daily as needed (for shortness of breath, leg swelling, or weight increase by 3lbs).  30 tablet  3  . Guaifenesin 1200 MG TB12 Take 1 tablet  by mouth 2 (two) times daily.      [name] levothyroxine (SYNTHROID, LEVOTHROID) 150 MCG tablet Take 1 tablet (150 mcg total) by mouth daily before breakfast.  30 tablet  3  . Multiple Vitamin (MULTIVITAMIN WITH MINERALS) TABS Take 1 tablet by mouth daily.      . predniSONE (STERAPRED UNI-PAK) 10 MG tablet Prednisone dosing: Take  Prednisone 40mg  (4 tabs) x 3 days, then taper to 30mg  (3 tabs) x 3 days, then 20mg  (2 tabs) x 3days, then 10mg  (1 tab) x 3days, then OFF.  Dispense: 30 tabs (10mg  each), refills: None  30 tablet  0  . pyridostigmine (MESTINON) 60 MG tablet Take 60 mg by mouth 2 (two) times daily. Take one tablet every morning & one tablet at lunch.      . silodosin (RAPAFLO) 8 MG CAPS capsule Take 8 mg by mouth daily with lunch.      . warfarin (COUMADIN) 5 MG tablet Take 2.5 mg by mouth daily. Take as directed by coumadin clinic. This is a 90 day supply.        No Known Allergies  Patient Active Problem List  Diagnosis  . OBSTRUCTIVE SLEEP APNEA  . Hypotension-orthostatic  . Atrial fibrillation  . SICK SINUS/ TACHY-BRADY SYNDROME  . ASTHMATIC BRONCHITIS, ACUTE  . ATELECTASIS  . DYSAUTONOMIA  . PACEMAKER,  PERMANENT  . Overactive bladder  . Hoarseness of voice  . Hypertension  . SOB (shortness of breath)  . Chest pain  . Status post inguinal hernia repair, 08/26/2011.  . CHF, acute on chronic  . COPD exacerbation    History  Smoking status  . Former Smoker -- 1.0 packs/day for 25 years  . Types: Cigarettes  . Quit date: 08/09/1965  Smokeless tobacco  . Never Used    History  Alcohol Use No    "last alcohol ~ 2008"    Family History  Problem Relation Age of Onset  . Cancer Mother 50    GYN    Review of Systems: Constitutional: no fever chills diaphoresis or fatigue or change in weight.  Head and neck: no hearing loss, no epistaxis, no photophobia or visual disturbance. Respiratory: No cough, shortness of breath or wheezing. Cardiovascular: No chest pain  peripheral edema, palpitations. Gastrointestinal: No abdominal distention, no abdominal pain, no change in bowel habits hematochezia or melena. Genitourinary: No dysuria, no frequency, no urgency, no nocturia. Musculoskeletal:No arthralgias, no back pain, no gait disturbance or myalgias. Neurological: No dizziness, no headaches, no numbness, no seizures, no syncope, no weakness, no tremors. Hematologic: No lymphadenopathy, no easy bruising. Psychiatric: No confusion, no hallucinations, no sleep disturbance.    Physical Exam: Filed Vitals:   03/17/12 1626  BP: 118/80  Pulse: 75   the general appearance reveals a well-developed well-nourished elderly gentleman in no distress.  He still has a hoarse guttural voice.The head and neck exam reveals pupils equal and reactive.  Extraocular movements are full.  There is no scleral icterus.  The mouth and pharynx are normal.  The neck is supple.  The carotids reveal no bruits.  The jugular venous pressure is normal.  The  thyroid is not enlarged.  There is no lymphadenopathy.  The chest is clear to percussion and auscultation.  There are no rales or rhonchi.  Expansion of the chest is symmetrical.  The precordium is quiet.  The first heart sound is normal.  The second heart sound is physiologically split.  There is no murmur gallop rub or click.  The pulse is irregular  There is no abnormal lift or heave.  The abdomen is soft and nontender.  The bowel sounds are normal.  The liver and spleen are not enlarged.  There are no abdominal masses.  There are no abdominal bruits.  Extremities reveal good pedal pulses.  There is no phlebitis or edema.  There is no cyanosis or clubbing.  Strength is normal and symmetrical in all extremities.  There is no lateralizing weakness.  There are no sensory deficits.  The skin is warm and dry.  There is no rash.   EKG shows atrial fibrillation with demand ventricular pacemaker.  Assessment / Plan:  continue on same  medication.  He he will keep his appointment with Dr. Jenne Pane regarding ENT evaluation of his throat symptoms.  Recheck here in 3 months for followup office visit

## 2012-03-17 NOTE — Assessment & Plan Note (Addendum)
EKG today confirms that the patient is in atrial fibrillation with a atrial ventricular response.  He has an underlying ventricular pacemaker.  He is not having any TIA symptoms.  He is on long-term Coumadin.

## 2012-03-17 NOTE — Assessment & Plan Note (Signed)
The patient previously had been on low-dose Florinef for his orthostatic hypotension.  Presently he is on a tapering dose of prednisone.  He finishes the prednisone he will resume his Florinef.  In terms of his fluid balance he has a scale at home from [facility] [facility] she will transmit his daily weights.  He will be using his furosemide 20 mg tablets on a when necessary basis

## 2012-03-27 ENCOUNTER — Ambulatory Visit (INDEPENDENT_AMBULATORY_CARE_PROVIDER_SITE_OTHER): Payer: Medicare Other | Admitting: *Deleted

## 2012-03-27 DIAGNOSIS — I4891 Unspecified atrial fibrillation: Secondary | ICD-10-CM

## 2012-03-27 LAB — POCT INR: INR: 1.6

## 2012-03-28 ENCOUNTER — Telehealth: Payer: Self-pay | Admitting: Cardiology

## 2012-03-28 NOTE — Telephone Encounter (Signed)
Leaves off Lasix when his systolic blood pressure is less than 120

## 2012-03-28 NOTE — Telephone Encounter (Signed)
New problem:  [facility]  C/o patient b/p 86/54. Dizziness. Vs  Pulse 61, resp  18, ox 98 %

## 2012-03-28 NOTE — Telephone Encounter (Signed)
Tried to call nurse, went to voicemail and mailbox is full.   74/46 blood pressure for therapist but didn't drop when he stood up.  Patient states he had his Midodrine today.  Will forward to  Dr. Patty Sermons for review

## 2012-03-28 NOTE — Telephone Encounter (Signed)
Leave off Lasix when his systolic blood pressure is less than 100

## 2012-03-28 NOTE — Telephone Encounter (Signed)
Verified with  Dr. Patty Sermons and hold if systolic less than 120. Advised patient

## 2012-04-06 ENCOUNTER — Ambulatory Visit (INDEPENDENT_AMBULATORY_CARE_PROVIDER_SITE_OTHER): Payer: Medicare Other | Admitting: *Deleted

## 2012-04-06 DIAGNOSIS — I4891 Unspecified atrial fibrillation: Secondary | ICD-10-CM

## 2012-04-11 ENCOUNTER — Encounter (HOSPITAL_COMMUNITY): Payer: Self-pay | Admitting: *Deleted

## 2012-04-11 ENCOUNTER — Emergency Department (HOSPITAL_COMMUNITY): Payer: Medicare Other

## 2012-04-11 ENCOUNTER — Emergency Department (HOSPITAL_COMMUNITY)
Admission: EM | Admit: 2012-04-11 | Discharge: 2012-04-11 | Disposition: A | Discharge status: Home / Self Care | Payer: Medicare Other | Attending: Emergency Medicine | Admitting: Emergency Medicine

## 2012-04-11 DIAGNOSIS — K1370 Unspecified lesions of oral mucosa: Secondary | ICD-10-CM | POA: Insufficient documentation

## 2012-04-11 DIAGNOSIS — M109 Gout, unspecified: Secondary | ICD-10-CM | POA: Insufficient documentation

## 2012-04-11 DIAGNOSIS — S20219A Contusion of unspecified front wall of thorax, initial encounter: Secondary | ICD-10-CM | POA: Insufficient documentation

## 2012-04-11 DIAGNOSIS — Z79899 Other long term (current) drug therapy: Secondary | ICD-10-CM | POA: Insufficient documentation

## 2012-04-11 DIAGNOSIS — E039 Hypothyroidism, unspecified: Secondary | ICD-10-CM | POA: Insufficient documentation

## 2012-04-11 DIAGNOSIS — G4730 Sleep apnea, unspecified: Secondary | ICD-10-CM | POA: Insufficient documentation

## 2012-04-11 DIAGNOSIS — H409 Unspecified glaucoma: Secondary | ICD-10-CM | POA: Insufficient documentation

## 2012-04-11 DIAGNOSIS — Y92009 Unspecified place in unspecified non-institutional (private) residence as the place of occurrence of the external cause: Secondary | ICD-10-CM | POA: Insufficient documentation

## 2012-04-11 DIAGNOSIS — Z950 Presence of cardiac pacemaker: Secondary | ICD-10-CM | POA: Insufficient documentation

## 2012-04-11 DIAGNOSIS — I10 Essential (primary) hypertension: Secondary | ICD-10-CM | POA: Insufficient documentation

## 2012-04-11 DIAGNOSIS — I509 Heart failure, unspecified: Secondary | ICD-10-CM | POA: Insufficient documentation

## 2012-04-11 DIAGNOSIS — Z87891 Personal history of nicotine dependence: Secondary | ICD-10-CM | POA: Insufficient documentation

## 2012-04-11 DIAGNOSIS — W010XXA Fall on same level from slipping, tripping and stumbling without subsequent striking against object, initial encounter: Secondary | ICD-10-CM | POA: Insufficient documentation

## 2012-04-11 DIAGNOSIS — I4891 Unspecified atrial fibrillation: Secondary | ICD-10-CM | POA: Insufficient documentation

## 2012-04-11 DIAGNOSIS — Z7901 Long term (current) use of anticoagulants: Secondary | ICD-10-CM | POA: Insufficient documentation

## 2012-04-11 MED ORDER — OXYCODONE-ACETAMINOPHEN 5-325 MG PO TABS
1.0000 | ORAL_TABLET | ORAL | Status: DC | PRN
Start: 2012-04-11 — End: 2012-04-17

## 2012-04-11 MED ORDER — MUPIROCIN CALCIUM 2 % EX CREA: TOPICAL_CREAM | Freq: Three times a day (TID) | CUTANEOUS | Status: DC

## 2012-04-11 MED ORDER — MORPHINE SULFATE 4 MG/ML IJ SOLN
4.0000 mg | Freq: Once | INTRAMUSCULAR | Status: AC
  Administered 2012-04-11: 4 mg via INTRAMUSCULAR
  Filled 2012-04-11: qty 1

## 2012-04-11 NOTE — ED Notes (Signed)
Reports got up to go to bathroom, reached & missed back of chair that he uses & fell. C/o right flank area pain when he hit the chair. Denies LOC

## 2012-04-11 NOTE — ED Provider Notes (Addendum)
History     CSN: [id number]  Arrival date & time 04/11/12  5784   First MD Initiated Contact with Patient 04/11/12 [phone]      Chief Complaint  Patient presents with  . Fall  . Back Pain    (Consider location/radiation/quality/duration/timing/severity/associated sxs/prior treatment) HPI Comments: Patient presents with right posterior rib pain after he fell earlier this morning. He states he was going up to the bathroom and reached over for his walker and is the walker and fell backward hitting his right upper back area. He's had constant throbbing pain to this area. It's worse with movement and worse with breathing. Denies any shortness of breath. Denies any other injuries from the fall. Denies any head or neck injury. Denies any loss of consciousness.   Past Medical History  Diagnosis Date  . Long-term (current) use of anticoagulants   . Glaucoma   . Gout   . Hyperthyroidism   . Orthostatic hypotension   . Redundant prepuce and phimosis   . Acute cystitis   . Hypertonicity of bladder   . HTN (hypertension)   . Atelectasis   . Dysautonomia   . Pacemaker     St. Jude; device generator replacement 2012  . CHF (congestive heart failure)     Previous preserved EF.  [name] Atrial fibrillation   . Bradyarrhythmia     bradycardia  . Pneumonia 08/2011    after hernia OR  . Hypothyroidism   . Sleep apnea     does not use cpap ; last used ~ 2011 (03/10/12)  . Spinal stenosis of lumbar region   . Chronic kidney disease [facility]     UTI  1 MO AGO TX MEDICALLY   . Osteomyelitis of toe of right foot ~ 2009    amputated 2nd toe    Past Surgical History  Procedure Date  . Cardioversion 12/19/2003    cardioversion and pacemaker interrogation  . Elbow bursa surgery 04/07/2006    Right elbow septic olecranon bursitis  . Toe amputation ~ 2009    Osteomyelitis, right foot second toe /  Right foot second ray amputation  . Cardiac catheterization 07/03/2002    EF 60-70% /  Normal  left ventricular function. / Very mild trivial three-vessel coronary atherosclerosis. / There is no mitral regurgitation noted  . Tonsillectomy and adenoidectomy     "when I was a youngster"  . Appendectomy     "when I was a youngster"  . Inguinal hernia repair ~ 2003    right  . Inguinal hernia repair 08/26/2011    Procedure: HERNIA REPAIR INGUINAL ADULT;  Surgeon: Kandis Cocking, MD;  Location: [facility] OR;  Service: General;  Laterality: Left;  open left inguinal hernia  . Insert / replace / remove pacemaker 07/04/2002 dr Graciela Husbands     dual chamber pacemaker - St. Jude model 1688 TC 58 cm active fixation lead was placed into the right ventricle and the St Jude model 1688 TC 52 cm active fixation pacing lead was placed in the right atrium.  The ventricular lead serial number was [id number] and the atrial lead serial number was [id number]/  Of note, the pacemaker generator was an identity [id number] serial number [id number]   . Insert / replace / remove pacemaker ~ 06/2011    St Jude  . Cataract extraction w/ intraocular lens  implant, bilateral 01/2005  . Eye surgery     Family History  Problem Relation Age of Onset  . Cancer Mother 38  GYN    History  Substance Use Topics  . Smoking status: Former Smoker -- 1.0 packs/day for 25 years    Types: Cigarettes    Quit date: 08/09/1965  . Smokeless tobacco: Never Used  . Alcohol Use: No     "last alcohol ~ 2008"      Review of Systems  Constitutional: Negative for fever, chills, diaphoresis and fatigue.  HENT: Negative for congestion, rhinorrhea and sneezing.   Eyes: Negative.   Respiratory: Negative for cough, chest tightness and shortness of breath.   Cardiovascular: Negative for chest pain and leg swelling.  Gastrointestinal: Negative for nausea, vomiting, abdominal pain, diarrhea and blood in stool.  Genitourinary: Negative for frequency, hematuria, flank pain and difficulty urinating.  Musculoskeletal: Positive for back pain. Negative for  arthralgias.  Skin: Negative for rash.  Neurological: Negative for dizziness, speech difficulty, weakness, numbness and headaches.    Allergies  Review of patient's allergies indicates no known allergies.  Home Medications   Current Outpatient Rx  Name Route Sig Dispense Refill  . ACETAMINOPHEN 500 MG PO TABS Oral Take 500-1,000 mg by mouth every 6 (six) hours as needed. For pain    . ALBUTEROL SULFATE HFA 108 (90 BASE) MCG/ACT IN AERS Inhalation Inhale 1 puff into the lungs 2 (two) times daily. X 3 days, then continue as needed for shortness of breath or wheezing 1 Inhaler 3  . DIGOXIN 0.125 MG PO TABS Oral Take 125 mcg by mouth daily.    . DORZOLAMIDE HCL-TIMOLOL MAL 22.3-6.8 MG/ML OP SOLN Both Eyes Place 1 drop into both eyes daily.     [name] FINASTERIDE 5 MG PO TABS Oral Take 5 mg by mouth daily.     [name] FLUDROCORTISONE ACETATE 0.1 MG PO TABS Oral Take 0.1 mg by mouth daily.    . FUROSEMIDE 20 MG PO TABS Oral Take 1 tablet (20 mg total) by mouth daily as needed (for shortness of breath, leg swelling, or weight increase by 3lbs). 30 tablet 3  . LEVOTHYROXINE SODIUM 150 MCG PO TABS Oral Take 1 tablet (150 mcg total) by mouth daily before breakfast. 30 tablet 3  . MIDODRINE HCL 5 MG PO TABS Oral Take 5 mg by mouth 3 (three) times daily as needed. When blood pressure is low    . ADULT MULTIVITAMIN W/MINERALS CH Oral Take 1 tablet by mouth daily.    [name] PYRIDOSTIGMINE BROMIDE 60 MG PO TABS Oral Take 60 mg by mouth 3 (three) times daily. Breakfast lunch and dinner    . SILODOSIN 8 MG PO CAPS Oral Take 8 mg by mouth daily with lunch.    . WARFARIN SODIUM 5 MG PO TABS Oral Take 2.5 mg by mouth daily.     [name] MUPIROCIN CALCIUM 2 % EX CREA Topical Apply topically 3 (three) times daily. 15 g 0  . OXYCODONE-ACETAMINOPHEN 5-325 MG PO TABS Oral Take 1 tablet by mouth every 4 (four) hours as needed for pain. 20 tablet 0    BP 167/84  Pulse 61  Temp 97.8 F (36.6 C) (Oral)  Resp 19  SpO2 97%  Physical  Exam  Constitutional: He is oriented to person, place, and time. He appears well-developed and well-nourished.  HENT:  Head: Normocephalic and atraumatic.  Eyes: Pupils are equal, round, and reactive to light.  Neck: Normal range of motion. Neck supple.  Cardiovascular: Normal rate, regular rhythm and normal heart sounds.   Pulmonary/Chest: Effort normal and breath sounds normal. No respiratory distress. He has  no wheezes. He has no rales. He exhibits no tenderness.  Abdominal: Soft. Bowel sounds are normal. There is no tenderness. There is no rebound and no guarding.  Musculoskeletal: Normal range of motion. He exhibits no edema.       He has some tenderness to palpation over the right mid posterior ribs. There is no crepitus or deformity noted no signs of external trauma noted. He has no pain along the spine. He has no pain on palpation or range of motion extremities.  Lymphadenopathy:    He has no cervical adenopathy.  Neurological: He is alert and oriented to person, place, and time.  Skin: Skin is warm and dry. No rash noted.       He has a small sore to his upper lip. There is no surrounding erythema. No drainage. No induration or fluctuance.  Psychiatric: He has a normal mood and affect.    ED Course  Procedures (including critical care time)  Labs Reviewed - No data to display Dg Chest 2 View  04/11/2012  *RADIOLOGY REPORT*  Clinical Data: Fall  CHEST - 2 VIEW  Comparison: 03/10/2012  Findings: Moderate cardiomegaly.  Dual lead left subclavian pacemaker device and leads are stable.  Low volumes.  Bibasilar atelectasis.  No pneumothorax.  No obvious acute rib fracture. Stable thoracic spine.  IMPRESSION: Cardiomegaly and bibasilar atelectasis.  No active cardiopulmonary disease.   Original Report Authenticated By: Donavan Burnet, M.D.     Date: 04/11/2012  Rate: 64  Rhythm: ventricular paced rhythm  QRS Axis: left  Intervals: normal  ST/T Wave abnormalities: wide complex   Conduction Disutrbances:wide complex associated with paced rhythm  Narrative Interpretation:   Old EKG Reviewed: unchanged    1. Rib contusion       MDM  Patient has no definite rib fracture noted. He has no abdominal pain. No evidence of pneumothorax. He will be treated with prescription for Percocet. He was also given a shot of morphine here in the emergency department. He was given an incentive spirometer to use. He also has a history of staph infections and with the small sore on his upper lip by the given prescription for Bactroban cream to use for that. There is no signs of surrounding cellulitis. There is no drainable abscess noted.        Rolan Bucco, MD 04/11/12 1030  Rolan Bucco, MD 04/11/12 [phone]

## 2012-04-11 NOTE — ED Notes (Signed)
Pt undressed, in gown, on monitor, continuous pulse oximetry and blood pressure cuff; EKG performed 

## 2012-04-11 NOTE — ED Notes (Addendum)
Reports he usually holds onto back of chair to go to bathroom but missed this morning. States he fell against chair then landed on floor. Denies any other injuries, no LOC. No redness, hematoma, abrasion, ecchymosis noted to area. States normally uses a walker or cane to ambulate but was not using one this morning when he fell.

## 2012-04-12 ENCOUNTER — Telehealth: Payer: Self-pay | Admitting: Cardiology

## 2012-04-12 NOTE — Telephone Encounter (Signed)
Up 5 pounds since Sunday.  Feet and legs swollen.  No furosemide yesterday or today until he took his weight about 1:30.   Denies any increased shortness of breath. Unable to sleep in bed, has to sleep sitting up in recliner. Will forward to  Dr. Patty Sermons for review

## 2012-04-12 NOTE — Telephone Encounter (Signed)
Pt's dtr calling re pt having weight gain of 5 lbs over the last few days, pls advise

## 2012-04-12 NOTE — Telephone Encounter (Signed)
Advised patient and he will let us know if no better

## 2012-04-12 NOTE — Telephone Encounter (Signed)
Take 40 mg today and then 20 mg daily thereafter

## 2012-04-13 ENCOUNTER — Other Ambulatory Visit: Payer: Self-pay | Admitting: Cardiology

## 2012-04-13 DIAGNOSIS — R52 Pain, unspecified: Secondary | ICD-10-CM

## 2012-04-13 MED ORDER — HYDROCODONE-ACETAMINOPHEN 5-300 MG PO TABS: ORAL_TABLET | ORAL | Status: DC

## 2012-04-13 NOTE — Telephone Encounter (Signed)
Unable to get in to see  Elvina Sidle, MD until Monday and is almost out of Oxycodone and wanted to know if  Dr. Patty Sermons will refill.  Will forward to  Dr. Patty Sermons.

## 2012-04-13 NOTE — Telephone Encounter (Signed)
Advised patient

## 2012-04-13 NOTE — Telephone Encounter (Signed)
No to oxycodone but we can call him in some vicodin 5/300 one every 4-6 hr prn to get him through til he can see Dr. Milus Glazier Monday.

## 2012-04-13 NOTE — Telephone Encounter (Signed)
New Problem:    Called in wanting to speak with you about the patient's pain.  Please call back.

## 2012-04-17 ENCOUNTER — Ambulatory Visit (INDEPENDENT_AMBULATORY_CARE_PROVIDER_SITE_OTHER): Payer: Medicare Other | Admitting: Family Medicine

## 2012-04-17 VITALS — BP 116/66 | HR 76 | Temp 97.7°F | Resp 18

## 2012-04-17 DIAGNOSIS — R079 Chest pain, unspecified: Secondary | ICD-10-CM

## 2012-04-17 DIAGNOSIS — R609 Edema, unspecified: Secondary | ICD-10-CM

## 2012-04-17 DIAGNOSIS — R600 Localized edema: Secondary | ICD-10-CM

## 2012-04-17 MED ORDER — FUROSEMIDE 40 MG PO TABS: 40.0000 mg | ORAL_TABLET | Freq: Every day | ORAL | Status: DC

## 2012-04-17 MED ORDER — OXYCODONE-ACETAMINOPHEN 5-325 MG PO TABS: 1.0000 | ORAL_TABLET | Freq: Three times a day (TID) | ORAL | Status: DC | PRN

## 2012-04-17 NOTE — Progress Notes (Signed)
78 yo retired man who fell last week and is thought to have a right rib fracture Tuesday the 2nd of September.  In addition, he  Has gained about 5 lbs and his legs have become swollen.  He is sleeping in his recliner.  He is developing orthopnea and a persistent cough  PMHx:  CHF  Objective:  NAD Chest:  Tender right lower posterior ribs;  Bibasilar wet rales Heart:  II/VI systolic murmur Ext: 3+ edema bilaterally with mild erythema both feet.  Assessment:  Early CHF with rib contusion/fx  Plan:   Increase Lasix to 40 Percocet 5-325 #20 1 tid

## 2012-04-19 ENCOUNTER — Emergency Department (HOSPITAL_COMMUNITY): Payer: Medicare Other

## 2012-04-19 ENCOUNTER — Ambulatory Visit (INDEPENDENT_AMBULATORY_CARE_PROVIDER_SITE_OTHER): Payer: Medicare Other | Admitting: Family Medicine

## 2012-04-19 ENCOUNTER — Ambulatory Visit: Payer: Medicare Other

## 2012-04-19 ENCOUNTER — Encounter (HOSPITAL_COMMUNITY): Payer: Self-pay | Admitting: Vascular Surgery

## 2012-04-19 ENCOUNTER — Inpatient Hospital Stay (HOSPITAL_COMMUNITY)
Admission: EM | Admit: 2012-04-19 | Discharge: 2012-04-22 | DRG: 183 | Disposition: A | Discharge status: Skilled Nursing Facility | Payer: Medicare Other | Attending: Internal Medicine | Admitting: Internal Medicine

## 2012-04-19 ENCOUNTER — Encounter: Payer: Self-pay | Admitting: Family Medicine

## 2012-04-19 ENCOUNTER — Other Ambulatory Visit: Payer: Self-pay | Admitting: Family Medicine

## 2012-04-19 ENCOUNTER — Telehealth: Payer: Self-pay | Admitting: *Deleted

## 2012-04-19 VITALS — BP 100/60 | HR 64 | Temp 97.6°F | Resp 16

## 2012-04-19 DIAGNOSIS — Q078 Other specified congenital malformations of nervous system: Secondary | ICD-10-CM

## 2012-04-19 DIAGNOSIS — I509 Heart failure, unspecified: Secondary | ICD-10-CM

## 2012-04-19 DIAGNOSIS — I959 Hypotension, unspecified: Secondary | ICD-10-CM | POA: Diagnosis present

## 2012-04-19 DIAGNOSIS — R5381 Other malaise: Secondary | ICD-10-CM | POA: Diagnosis present

## 2012-04-19 DIAGNOSIS — S27329A Contusion of lung, unspecified, initial encounter: Secondary | ICD-10-CM | POA: Diagnosis present

## 2012-04-19 DIAGNOSIS — I5033 Acute on chronic diastolic (congestive) heart failure: Secondary | ICD-10-CM | POA: Diagnosis present

## 2012-04-19 DIAGNOSIS — I495 Sick sinus syndrome: Secondary | ICD-10-CM | POA: Diagnosis present

## 2012-04-19 DIAGNOSIS — Z950 Presence of cardiac pacemaker: Secondary | ICD-10-CM

## 2012-04-19 DIAGNOSIS — IMO0001 Reserved for inherently not codable concepts without codable children: Secondary | ICD-10-CM

## 2012-04-19 DIAGNOSIS — N189 Chronic kidney disease, unspecified: Secondary | ICD-10-CM | POA: Diagnosis present

## 2012-04-19 DIAGNOSIS — I129 Hypertensive chronic kidney disease with stage 1 through stage 4 chronic kidney disease, or unspecified chronic kidney disease: Secondary | ICD-10-CM | POA: Diagnosis present

## 2012-04-19 DIAGNOSIS — Z87891 Personal history of nicotine dependence: Secondary | ICD-10-CM

## 2012-04-19 DIAGNOSIS — I4891 Unspecified atrial fibrillation: Secondary | ICD-10-CM | POA: Diagnosis present

## 2012-04-19 DIAGNOSIS — J209 Acute bronchitis, unspecified: Secondary | ICD-10-CM

## 2012-04-19 DIAGNOSIS — E039 Hypothyroidism, unspecified: Secondary | ICD-10-CM | POA: Diagnosis present

## 2012-04-19 DIAGNOSIS — G7000 Myasthenia gravis without (acute) exacerbation: Secondary | ICD-10-CM | POA: Diagnosis present

## 2012-04-19 DIAGNOSIS — S98139A Complete traumatic amputation of one unspecified lesser toe, initial encounter: Secondary | ICD-10-CM

## 2012-04-19 DIAGNOSIS — I10 Essential (primary) hypertension: Secondary | ICD-10-CM | POA: Diagnosis present

## 2012-04-19 DIAGNOSIS — E876 Hypokalemia: Secondary | ICD-10-CM | POA: Diagnosis present

## 2012-04-19 DIAGNOSIS — K8689 Other specified diseases of pancreas: Secondary | ICD-10-CM | POA: Diagnosis present

## 2012-04-19 DIAGNOSIS — W19XXXA Unspecified fall, initial encounter: Secondary | ICD-10-CM | POA: Diagnosis present

## 2012-04-19 DIAGNOSIS — Z8719 Personal history of other diseases of the digestive system: Secondary | ICD-10-CM

## 2012-04-19 DIAGNOSIS — Z79899 Other long term (current) drug therapy: Secondary | ICD-10-CM

## 2012-04-19 DIAGNOSIS — I951 Orthostatic hypotension: Secondary | ICD-10-CM | POA: Diagnosis present

## 2012-04-19 DIAGNOSIS — N3281 Overactive bladder: Secondary | ICD-10-CM | POA: Diagnosis present

## 2012-04-19 DIAGNOSIS — R079 Chest pain, unspecified: Secondary | ICD-10-CM

## 2012-04-19 DIAGNOSIS — S2249XA Multiple fractures of ribs, unspecified side, initial encounter for closed fracture: Principal | ICD-10-CM | POA: Diagnosis present

## 2012-04-19 DIAGNOSIS — G4733 Obstructive sleep apnea (adult) (pediatric): Secondary | ICD-10-CM | POA: Diagnosis present

## 2012-04-19 DIAGNOSIS — R531 Weakness: Secondary | ICD-10-CM

## 2012-04-19 DIAGNOSIS — I5032 Chronic diastolic (congestive) heart failure: Secondary | ICD-10-CM | POA: Diagnosis present

## 2012-04-19 LAB — CBC WITH DIFFERENTIAL/PLATELET
Basophils Absolute: 0 10*3/uL (ref 0.0–0.1)
Basophils Relative: 0 % (ref 0–1)
Eosinophils Relative: 0 % (ref 0–5)
HCT: 40.9 % (ref 39.0–52.0)
Lymphocytes Relative: 10 % — ABNORMAL LOW (ref 12–46)
MCH: 32.4 pg (ref 26.0–34.0)
MCHC: 33.7 g/dL (ref 30.0–36.0)
MCV: 96 fL (ref 78.0–100.0)
Monocytes Absolute: 1.5 10*3/uL — ABNORMAL HIGH (ref 0.1–1.0)
RDW: 13.4 % (ref 11.5–15.5)

## 2012-04-19 LAB — PRO B NATRIURETIC PEPTIDE: Pro B Natriuretic peptide (BNP): 2303 pg/mL — ABNORMAL HIGH (ref 0–450)

## 2012-04-19 LAB — URINALYSIS, ROUTINE W REFLEX MICROSCOPIC
Bilirubin Urine: NEGATIVE
Glucose, UA: NEGATIVE mg/dL
Ketones, ur: NEGATIVE mg/dL
Protein, ur: NEGATIVE mg/dL
Urobilinogen, UA: 1 mg/dL (ref 0.0–1.0)

## 2012-04-19 LAB — POCT CBC
Granulocyte percent: 78.4 %G (ref 37–80)
HCT, POC: 46.1 % (ref 43.5–53.7)
Hemoglobin: 14 g/dL — AB (ref 14.1–18.1)
Lymph, poc: 1.4 (ref 0.6–3.4)
MCH, POC: 30.8 pg (ref 27–31.2)
MCHC: 30.4 g/dL — AB (ref 31.8–35.4)
MCV: 101.6 fL — AB (ref 80–97)
MID (cbc): 1.1 — AB (ref 0–0.9)
MPV: 7.3 fL (ref 0–99.8)
POC Granulocyte: 9 — AB (ref 2–6.9)
POC LYMPH PERCENT: 12.3 %L (ref 10–50)
POC MID %: 9.3 %M (ref 0–12)
Platelet Count, POC: 265 10*3/uL (ref 142–424)
RBC: 4.54 M/uL — AB (ref 4.69–6.13)
RDW, POC: 14.2 %
WBC: 11.5 10*3/uL — AB (ref 4.6–10.2)

## 2012-04-19 LAB — COMPREHENSIVE METABOLIC PANEL
AST: 25 U/L (ref 0–37)
CO2: 32 mEq/L (ref 19–32)
Calcium: 9.9 mg/dL (ref 8.4–10.5)
Creatinine, Ser: 1.04 mg/dL (ref 0.50–1.35)
GFR calc non Af Amer: 62 mL/min — ABNORMAL LOW (ref >90)

## 2012-04-19 LAB — URINE MICROSCOPIC-ADD ON

## 2012-04-19 LAB — TROPONIN I: Troponin I: <0.3 ng/mL (ref <0.30)

## 2012-04-19 LAB — DIGOXIN LEVEL: Digoxin Level: 0.5 ng/mL — ABNORMAL LOW (ref 0.8–2.0)

## 2012-04-19 LAB — PROTIME-INR: Prothrombin Time: 32.2 seconds — ABNORMAL HIGH (ref 11.6–15.2)

## 2012-04-19 MED ORDER — FUROSEMIDE 10 MG/ML IJ SOLN
40.0000 mg | Freq: Every day | INTRAMUSCULAR | Status: DC
  Filled 2012-04-19: qty 4

## 2012-04-19 MED ORDER — DIGOXIN 125 MCG PO TABS
125.0000 ug | ORAL_TABLET | Freq: Every day | ORAL | Status: DC
  Administered 2012-04-19 – 2012-04-22 (×4): 125 ug via ORAL
  Filled 2012-04-19 (×4): qty 1

## 2012-04-19 MED ORDER — SODIUM CHLORIDE 0.9 % IJ SOLN: 3.0000 mL | INTRAMUSCULAR | Status: DC | PRN

## 2012-04-19 MED ORDER — ALBUTEROL SULFATE HFA 108 (90 BASE) MCG/ACT IN AERS
1.0000 | INHALATION_SPRAY | Freq: Two times a day (BID) | RESPIRATORY_TRACT | Status: DC
  Administered 2012-04-20 – 2012-04-22 (×6): 1 via RESPIRATORY_TRACT
  Filled 2012-04-19: qty 6.7

## 2012-04-19 MED ORDER — IOHEXOL 300 MG/ML  SOLN
100.0000 mL | Freq: Once | INTRAMUSCULAR | Status: AC | PRN
  Administered 2012-04-19: 100 mL via INTRAVENOUS

## 2012-04-19 MED ORDER — FINASTERIDE 5 MG PO TABS
5.0000 mg | ORAL_TABLET | Freq: Every day | ORAL | Status: DC
  Administered 2012-04-20 – 2012-04-22 (×3): 5 mg via ORAL
  Filled 2012-04-19 (×4): qty 1

## 2012-04-19 MED ORDER — SODIUM CHLORIDE 0.9 % IV SOLN: 250.0000 mL | INTRAVENOUS | Status: DC | PRN

## 2012-04-19 MED ORDER — ADULT MULTIVITAMIN W/MINERALS CH
1.0000 | ORAL_TABLET | Freq: Every day | ORAL | Status: DC
  Administered 2012-04-20 – 2012-04-22 (×3): 1 via ORAL
  Filled 2012-04-19 (×4): qty 1

## 2012-04-19 MED ORDER — MIDODRINE HCL 5 MG PO TABS
5.0000 mg | ORAL_TABLET | Freq: Three times a day (TID) | ORAL | Status: DC | PRN
  Filled 2012-04-19: qty 1

## 2012-04-19 MED ORDER — ONDANSETRON HCL 4 MG/2ML IJ SOLN: 4.0000 mg | Freq: Four times a day (QID) | INTRAMUSCULAR | Status: DC | PRN

## 2012-04-19 MED ORDER — ACETAMINOPHEN 325 MG PO TABS: 650.0000 mg | ORAL_TABLET | Freq: Four times a day (QID) | ORAL | Status: DC | PRN

## 2012-04-19 MED ORDER — PYRIDOSTIGMINE BROMIDE 60 MG PO TABS
60.0000 mg | ORAL_TABLET | Freq: Three times a day (TID) | ORAL | Status: DC
  Administered 2012-04-19 – 2012-04-22 (×8): 60 mg via ORAL
  Filled 2012-04-19 (×10): qty 1

## 2012-04-19 MED ORDER — LEVOTHYROXINE SODIUM 150 MCG PO TABS
150.0000 ug | ORAL_TABLET | Freq: Every day | ORAL | Status: DC
  Administered 2012-04-20 – 2012-04-22 (×3): 150 ug via ORAL
  Filled 2012-04-19 (×4): qty 1

## 2012-04-19 MED ORDER — DORZOLAMIDE HCL-TIMOLOL MAL 2-0.5 % OP SOLN
1.0000 [drp] | Freq: Every day | OPHTHALMIC | Status: DC
  Administered 2012-04-20 – 2012-04-22 (×3): 1 [drp] via OPHTHALMIC
  Filled 2012-04-19: qty 10

## 2012-04-19 MED ORDER — FUROSEMIDE 10 MG/ML IJ SOLN
40.0000 mg | Freq: Once | INTRAMUSCULAR | Status: AC
  Administered 2012-04-19: 40 mg via INTRAVENOUS
  Filled 2012-04-19: qty 4

## 2012-04-19 MED ORDER — ONDANSETRON HCL 4 MG PO TABS: 4.0000 mg | ORAL_TABLET | Freq: Four times a day (QID) | ORAL | Status: DC | PRN

## 2012-04-19 MED ORDER — SODIUM CHLORIDE 0.9 % IJ SOLN
3.0000 mL | Freq: Two times a day (BID) | INTRAMUSCULAR | Status: DC
Start: 2012-04-19 — End: 2012-04-22
  Administered 2012-04-20 – 2012-04-21 (×2): 3 mL via INTRAVENOUS
  Administered 2012-04-21: 10 mL via INTRAVENOUS
  Administered 2012-04-22: 3 mL via INTRAVENOUS

## 2012-04-19 MED ORDER — LEVOFLOXACIN IN D5W 500 MG/100ML IV SOLN
500.0000 mg | INTRAVENOUS | Status: DC
  Administered 2012-04-19 – 2012-04-21 (×3): 500 mg via INTRAVENOUS
  Filled 2012-04-19 (×4): qty 100

## 2012-04-19 MED ORDER — FLUDROCORTISONE ACETATE 0.1 MG PO TABS
0.1000 mg | ORAL_TABLET | Freq: Every day | ORAL | Status: DC
  Administered 2012-04-20 – 2012-04-22 (×3): 0.1 mg via ORAL
  Filled 2012-04-19 (×4): qty 1

## 2012-04-19 MED ORDER — ALUM & MAG HYDROXIDE-SIMETH 200-200-20 MG/5ML PO SUSP: 30.0000 mL | Freq: Four times a day (QID) | ORAL | Status: DC | PRN

## 2012-04-19 MED ORDER — SENNOSIDES-DOCUSATE SODIUM 8.6-50 MG PO TABS
1.0000 | ORAL_TABLET | Freq: Every evening | ORAL | Status: DC | PRN
  Administered 2012-04-21: 1 via ORAL
  Filled 2012-04-19 (×2): qty 1

## 2012-04-19 MED ORDER — OXYCODONE-ACETAMINOPHEN 5-325 MG PO TABS
1.0000 | ORAL_TABLET | Freq: Three times a day (TID) | ORAL | Status: DC | PRN
  Administered 2012-04-20 – 2012-04-21 (×3): 1 via ORAL
  Filled 2012-04-19 (×4): qty 1

## 2012-04-19 MED ORDER — ACETAMINOPHEN 650 MG RE SUPP: 650.0000 mg | Freq: Four times a day (QID) | RECTAL | Status: DC | PRN

## 2012-04-19 MED ORDER — SODIUM CHLORIDE 0.9 % IJ SOLN
3.0000 mL | Freq: Two times a day (BID) | INTRAMUSCULAR | Status: DC
  Administered 2012-04-20 – 2012-04-22 (×3): 3 mL via INTRAVENOUS

## 2012-04-19 NOTE — ED Notes (Signed)
Denies CP, SOB, edema noted to bilat LE

## 2012-04-19 NOTE — Progress Notes (Signed)
78 yo married man (wife in [place] because of weakness) who on Sept 2nd fell and apparently fractured a right rib.  He subsequently gained about 5 lbs and has been getting progressively weaker.    We increased his lasix on Monday because of increasing edema and inability to lie flat.  Since then, he has become somewhat dizzy, more short of breath, and weaker.  His weight is down 2.5 lbs according to his daughters.  They note increasing ecchymosis on right flank.  Also, he is not eating and coughing more with productive yellow phlegm today.  Objective:  NAD.  Fingers cool and nail beds dusky.  He is alert and cooperative. HEENT:  No acute changes Chest:  Decreased breath sounds Heart:  Irreg, irreg with distant heart sounds and II/VI systolic murmur best heart over sternum Ext:  3+ pedal edema without skin breaks Skin:  Large confluent ecchymosis right flank. [facility] reading (PRIMARY) by  Dr. Milus Glazier:  CXR: poor quality, poor inspiration, cardiomegaly with increased fluid markings c/w CHF Results for orders placed in visit on 04/19/12  POCT CBC      Component Value Range   WBC 11.5 (*) 4.6 - 10.2 K/uL   Lymph, poc 1.4  0.6 - 3.4   POC LYMPH PERCENT 12.3  10 - 50 %L   MID (cbc) 1.1 (*) 0 - 0.9   POC MID % 9.3  0 - 12 %M   POC Granulocyte 9.0 (*) 2 - 6.9   Granulocyte percent 78.4  37 - 80 %G   RBC 4.54 (*) 4.69 - 6.13 M/uL   Hemoglobin 14.0 (*) 14.1 - 18.1 g/dL   HCT, POC 16.1  09.6 - 53.7 %   MCV 101.6 (*) 80 - 97 fL   MCH, POC 30.8  27 - 31.2 pg   MCHC 30.4 (*) 31.8 - 35.4 g/dL   RDW, POC 04.5     Platelet Count, POC 265  142 - 424 K/uL   MPV 7.3  0 - 99.8 fL     Assessment:  CHF, weak, atrial fibrillation, high risk medications (Coumadin) with progressive ecchymosis, productive cough c/w CHF or early bronchitis  Plan:  Transfer to [facility] ED for further evaluation and probable admission.

## 2012-04-19 NOTE — ED Notes (Signed)
Pt arrives to the ED via [facility] from doctor's office for evaluation of increasing weakness and right lower flank pain following a fall 9/03. Reports that he cracked a rib as a result of the fall. He is on blood thinners. Ecchymosis noted on the right flank. Also has been having episodes of SOB. Family requesting facility placement for the increasing weakness. Wife already placed at [facility]. Family hoping for same placement.

## 2012-04-19 NOTE — H&P (Signed)
[facility] History and Physical  [name]:[id number] DOB: 27-May-1921 DOA: 04/19/2012   PCP: Marco Clement, MD   Chief Complaint:  Chief Complaint  Patient presents with  . Weakness     HPI: Marco Gregory is a 78 y.o. male with past medical history of orthostatic hypotension, myasthenia gravis on Mestinon, diastolic congestive heart failure, who sustained a fall one week prior to admission. After the fall he has been having difficulty with breathing and significant chest pain. Today he presented to the emergency room and was found to have 2 rib fractures and a significant lung contusion. It was also noticed that the patient was volume overloaded despite the increase dose of lasix.  No swelling he coughed up dark sputum.    Review of Systems:  The patient has chronic hearing loss, occasional hoarseness, chest pain related to his fall, patient's chronic dizziness and chronic balance deficits The patient denies anorexia, fever, weight loss,, vision loss, , hoarseness, syncope,  hemoptysis, abdominal pain, melena, hematochezia, severe indigestion/heartburn, hematuria, incontinence, genital sores, muscle weakness, suspicious skin lesions, transient blindness,  depression, abnormal bleeding, enlarged lymph nodes, angioedema,  Past medical history  Extensive past medical history available in Epic has been reviewed please see below Past Medical History  Diagnosis Date  . Long-term (current) use of anticoagulants   . Glaucoma   . Gout   . Hyperthyroidism   . Orthostatic hypotension   . Redundant prepuce and phimosis   . Acute cystitis   . Hypertonicity of bladder   . HTN (hypertension)   . Atelectasis   . Dysautonomia   . Pacemaker     St. Jude; device generator replacement 2012  . CHF (congestive heart failure)     Previous preserved EF.  [name] Atrial fibrillation   . Bradyarrhythmia     bradycardia  . Pneumonia 08/2011    after hernia OR  .  Hypothyroidism   . Sleep apnea     does not use cpap ; last used ~ 2011 (03/10/12)  . Spinal stenosis of lumbar region   . Chronic kidney disease [facility]     UTI  1 MO AGO TX MEDICALLY   . Osteomyelitis of toe of right foot ~ 2009    amputated 2nd toe   Past Surgical History  Procedure Date  . Cardioversion 12/19/2003    cardioversion and pacemaker interrogation  . Elbow bursa surgery 04/07/2006    Right elbow septic olecranon bursitis  . Toe amputation ~ 2009    Osteomyelitis, right foot second toe /  Right foot second ray amputation  . Cardiac catheterization 07/03/2002    EF 60-70% /  Normal left ventricular function. / Very mild trivial three-vessel coronary atherosclerosis. / There is no mitral regurgitation noted  . Tonsillectomy and adenoidectomy     "when I was a youngster"  . Appendectomy     "when I was a youngster"  . Inguinal hernia repair ~ 2003    right  . Inguinal hernia repair 08/26/2011    Procedure: HERNIA REPAIR INGUINAL ADULT;  Surgeon: Kandis Cocking, MD;  Location: [facility] OR;  Service: General;  Laterality: Left;  open left inguinal hernia  . Insert / replace / remove pacemaker 07/04/2002 dr Graciela Husbands     dual chamber pacemaker - St. Jude model 1688 TC 58 cm active fixation lead was placed into the right ventricle and the St Jude model 1688 TC 52 cm active fixation pacing lead was placed in the right  atrium.  The ventricular lead serial number was [id number] and the atrial lead serial number was [id number]/  Of note, the pacemaker generator was an identity [id number] serial number [id number]   . Insert / replace / remove pacemaker ~ 06/2011    St Jude  . Cataract extraction w/ intraocular lens  implant, bilateral 01/2005  . Eye surgery    Social History:  reports that he quit smoking about 46 years ago. His smoking use included Cigarettes. He has a 25 pack-year smoking history. He has never used smokeless tobacco. He reports that he does not drink alcohol or use illicit  drugs. The patient lives alone at home and he is independent with all his activities of daily living. The patient's wife has recently been discharge from the hospital to a nursing home  No Known Allergies  Family History  Problem Relation Age of Onset  . Cancer Mother 13    GYN    Prior to Admission medications   Medication Sig Start Date End Date Taking? Authorizing Provider  acetaminophen (TYLENOL) 500 MG tablet Take 500-1,000 mg by mouth every 6 (six) hours as needed. For pain   Yes Historical Provider, MD  albuterol (PROVENTIL HFA;VENTOLIN HFA) 108 (90 BASE) MCG/ACT inhaler Inhale 1 puff into the lungs 2 (two) times daily. X 3 days, then continue as needed for shortness of breath or wheezing 03/12/12 03/12/13 Yes Ripudeep Marco Luo, MD  digoxin (LANOXIN) 0.125 MG tablet Take 125 mcg by mouth daily. 11/11/11  Yes Marco Clement, MD  dorzolamide-timolol (COSOPT) 22.3-6.8 MG/ML ophthalmic solution Place 1 drop into both eyes daily.    Yes Historical Provider, MD  finasteride (PROSCAR) 5 MG tablet Take 5 mg by mouth daily.    Yes Historical Provider, MD  fludrocortisone (FLORINEF) 0.1 MG tablet Take 0.1 mg by mouth daily.   Yes Historical Provider, MD  furosemide (LASIX) 40 MG tablet Take 1 tablet (40 mg total) by mouth daily. 04/17/12 04/17/13 Yes Elvina Sidle, MD  Hydrocodone-Acetaminophen (VICODIN) 5-300 MG TABS 1 tablet every 4 to 6 hours as needed for pain 04/13/12  Yes Marco Clement, MD  levothyroxine (SYNTHROID, LEVOTHROID) 150 MCG tablet Take 1 tablet (150 mcg total) by mouth daily before breakfast. 03/12/12 03/12/13 Yes Ripudeep Marco Luo, MD  midodrine (PROAMATINE) 5 MG tablet Take 5 mg by mouth 3 (three) times daily as needed. When blood pressure is low   Yes Historical Provider, MD  Multiple Vitamin (MULTIVITAMIN WITH MINERALS) TABS Take 1 tablet by mouth daily.   Yes Historical Provider, MD  oxyCODONE-acetaminophen (ROXICET) 5-325 MG per tablet Take 1 tablet by mouth every 8 (eight) hours as  needed for pain. 04/17/12 04/27/12 Yes Elvina Sidle, MD  pyridostigmine (MESTINON) 60 MG tablet Take 60 mg by mouth 3 (three) times daily. Breakfast lunch and dinner 04/06/11  Yes Duke Salvia, MD  silodosin (RAPAFLO) 8 MG CAPS capsule Take 8 mg by mouth daily with lunch.   Yes Historical Provider, MD  warfarin (COUMADIN) 5 MG tablet Take 2.5 mg by mouth daily.  11/23/11  Yes Marco Clement, MD   Physical Exam: Filed Vitals:   04/19/12 1350 04/19/12 1715 04/19/12 1800 04/19/12 1830  BP: 137/62 122/61 128/74 123/72  Pulse: 86 87 76 78  Temp: 99 F (37.2 C)     TempSrc: Oral     Resp: 22 18 22 17   SpO2: 97% 98% 100% 97%     General:  Alert and oriented x3  Eyes: Pupil equal round react to  light accommodation extraocular was intact  ENT: Clear pharynx  Neck: No JVD  Cardiovascular: Regular, 2/6 systolic murmur, +2 bilateral pitting edema  Respiratory: Crackles right base otherwise clear  Abdomen: Soft nontender, bowel sounds present  Skin: Pale dry, large ecchymosis over the right chest posteriorly  Musculoskeletal: Intact  Psychiatric: Euthymic  Neurologic: Cranial nerves 2-12 intact, strength 5 out of 5 in all 4 extremities, sensation intact  Labs on Admission:  Basic Metabolic Panel:  Lab 04/19/12 1610  NA 136  K 3.9  CL 95*  CO2 32  GLUCOSE 133*  BUN 18  CREATININE 1.04  CALCIUM 9.9  MG --  PHOS --   Liver Function Tests:  Lab 04/19/12 1359  AST 25  ALT 22  ALKPHOS 126*  BILITOT 1.5*  PROT 7.2  ALBUMIN 3.6   No results found for this basename: LIPASE:5,AMYLASE:5 in the last 168 hours No results found for this basename: AMMONIA:5 in the last 168 hours CBC:  Lab 04/19/12 1359 04/19/12 1302  WBC 9.9 11.5*  NEUTROABS 7.4 --  HGB 13.8 14.0*  HCT 40.9 46.1  MCV 96.0 101.6*  PLT 183 --   Cardiac Enzymes:  Lab 04/19/12 1358  CKTOTAL --  CKMB --  CKMBINDEX --  TROPONINI <0.30    BNP (last 3 results)  Basename 04/19/12 1358 03/10/12  1259 08/30/11 0920  PROBNP 2303.0* 1840.0* 615.2*   CBG: No results found for this basename: GLUCAP:5 in the last 168 hours  Radiological Exams on Admission: Dg Chest 1 View  04/19/2012  *RADIOLOGY REPORT*  Clinical Data: Chest pain and short of breath  CHEST - 1 VIEW  Comparison: 04/11/2012  Findings: Dual lead left subclavian pacemaker device is stable. Mild cardiomegaly.  Bibasilar atelectasis.  No Kerley B lines to suggest edema.  No pneumothorax.  IMPRESSION: Cardiomegaly without edema.  Bibasilar atelectasis.  Clinically significant discrepancy from primary report, if provided: None   Original Report Authenticated By: Donavan Burnet, M.D.    Dg Chest 2 View  04/19/2012  *RADIOLOGY REPORT*  Clinical Data: Weakness.  Recent fall.  Dizziness.  Productive cough.  History of hypertension.  Pacemaker.  Atrial fibrillation.  CHEST - 2 VIEW  Comparison: 04/19/2012 at 1223 hours.  Findings: 1435 hours.  Mild hyperinflation. Lateral view degraded by patient arm position.  Pacer with leads at right atrium and right ventricle.  No lead discontinuity.  The patient is rotated right.  Moderate cardiomegaly.  Possible trace bilateral pleural effusions versus pleural thickening. No pneumothorax.  No congestive failure.  Slight worsening right base and persistent left base air space disease.  IMPRESSION:  1.  Cardiomegaly without congestive failure. 2.  Cannot exclude trace bilateral pleural effusions.  Increased right and similar left base airspace disease.  Favor atelectasis. Developing infection or aspiration cannot be excluded.   Original Report Authenticated By: Consuello Bossier, M.D.    Ct Head Wo Contrast  04/19/2012  *RADIOLOGY REPORT*  Clinical Data: Fall on 09/03.  Increasing weakness.  On blood thinners.  CT HEAD WITHOUT CONTRAST  Technique:  Contiguous axial images were obtained from the base of the skull through the vertex without contrast.  Comparison: 06/12/2009  Findings: Bone windows demonstrate  motion degradation, despite 2 attempts.  Given this factor, no definite soft tissue swelling. Mucosal thickening of left maxillary sinus is mild. Clear mastoid air cells.  Soft tissue windows demonstrate normal cerebral volume for age. Given the extent of motion artifact, no  mass lesion, hemorrhage, hydrocephalus, acute infarct,  intra-axial, or extra-axial fluid collection.  .  IMPRESSION:  1.  Motion degradation, despite 2 attempts. 2.  Given this factor, no acute intracranial abnormality. 3.  If high high clinical suspicion of acute intracranial abnormality, consider repeat exam after when the patient is able to remain still.   Original Report Authenticated By: Consuello Bossier, M.D.    Ct Chest W Contrast  04/19/2012  *RADIOLOGY REPORT*  Clinical Data:  Fall, right flank pain.  Bruising to right flank. Shortness of breath.  CT CHEST, ABDOMEN AND PELVIS WITH CONTRAST  Technique:  Multidetector CT imaging of the chest, abdomen and pelvis was performed following the standard protocol during bolus administration of intravenous contrast.  Contrast: OMNIPAQUE IOHEXOL 300 MG/ML  SOLN  Comparison:  CT abdomen and pelvis12/01/2010.  Chest CT 08/31/2011.  CT CHEST  Findings:  Multiple right-sided the posterior lateral rib fractures, involving the 8th through 11th right ribs. Consolidation noted in the right lower lobe.  This is presumably contusion.  No pleural effusion or pneumothorax.  There is cardiomegaly.  Coronary artery calcifications.  Aorta is normal caliber. No mediastinal, hilar, or axillary adenopathy. Visualized thyroid and chest wall soft tissues unremarkable.  IMPRESSION: Right 8th through 11th posterolateral rib fractures.  No associated pneumothorax.  Airspace consolidation throughout much of the right lower lobe, presumably contusion.  CT ABDOMEN AND PELVIS  Findings:   Liver, gallbladder, spleen, adrenals, kidneys unremarkable except for small bilateral nonobstructing lower pole stones.  No  hydronephrosis.  The pancreas is atrophic with dilated pancreatic duct.  No visible obstructing mass seen.  However, no calcifications are visualized to suggest this is related to chronic pancreatitis.  I suspect this may be related to an intraductal lesion (IPMN).  If it is felt that the patient is a surgical candidate, further evaluation with MRI may be helpful.  Large and small bowel grossly unremarkable.  Urinary bladder unremarkable.  Aorta and iliac vessels are calcified.  Mild aneurysmal dilatation of the infrarenal abdominal aorta, measuring 3.1 cm maximally.  No acute bony abnormality.  IMPRESSION:  No evidence of acute abnormality in the abdomen or pelvis.  Pancreatic ductal dilatation with pancreatic atrophy.  Appearance concerning for intraductal neoplasm (IPMN).  If the patient is felt to be a surgical candidate, further evaluation and characterization with an MRI without and with contrast could be performed.   Original Report Authenticated By: Cyndie Chime, M.D.    Ct Abdomen Pelvis W Contrast  04/19/2012  *RADIOLOGY REPORT*  Clinical Data:  Fall, right flank pain.  Bruising to right flank. Shortness of breath.  CT CHEST, ABDOMEN AND PELVIS WITH CONTRAST  Technique:  Multidetector CT imaging of the chest, abdomen and pelvis was performed following the standard protocol during bolus administration of intravenous contrast.  Contrast: OMNIPAQUE IOHEXOL 300 MG/ML  SOLN  Comparison:  CT abdomen and pelvis12/01/2010.  Chest CT 08/31/2011.  CT CHEST  Findings:  Multiple right-sided the posterior lateral rib fractures, involving the 8th through 11th right ribs. Consolidation noted in the right lower lobe.  This is presumably contusion.  No pleural effusion or pneumothorax.  There is cardiomegaly.  Coronary artery calcifications.  Aorta is normal caliber. No mediastinal, hilar, or axillary adenopathy. Visualized thyroid and chest wall soft tissues unremarkable.  IMPRESSION: Right 8th through 11th  posterolateral rib fractures.  No associated pneumothorax.  Airspace consolidation throughout much of the right lower lobe, presumably contusion.  CT ABDOMEN AND PELVIS  Findings:   Liver, gallbladder, spleen, adrenals, kidneys  unremarkable except for small bilateral nonobstructing lower pole stones.  No hydronephrosis.  The pancreas is atrophic with dilated pancreatic duct.  No visible obstructing mass seen.  However, no calcifications are visualized to suggest this is related to chronic pancreatitis.  I suspect this may be related to an intraductal lesion (IPMN).  If it is felt that the patient is a surgical candidate, further evaluation with MRI may be helpful.  Large and small bowel grossly unremarkable.  Urinary bladder unremarkable.  Aorta and iliac vessels are calcified.  Mild aneurysmal dilatation of the infrarenal abdominal aorta, measuring 3.1 cm maximally.  No acute bony abnormality.  IMPRESSION:  No evidence of acute abnormality in the abdomen or pelvis.  Pancreatic ductal dilatation with pancreatic atrophy.  Appearance concerning for intraductal neoplasm (IPMN).  If the patient is felt to be a surgical candidate, further evaluation and characterization with an MRI without and with contrast could be performed.   Original Report Authenticated By: Cyndie Chime, M.D.     EKG: Independently reviewed. Atrial fibrillation with nonspecific bundle branch block  Assessment/Plan Principal Problem:  *Lung contusion Active Problems:  OBSTRUCTIVE SLEEP APNEA  Atrial fibrillation  SICK SINUS/ TACHY-BRADY SYNDROME  DYSAUTONOMIA  PACEMAKER, PERMANENT  Overactive bladder  Hypertension  Status post inguinal hernia repair, 08/26/2011.  Acute on chronic diastolic CHF (congestive heart failure), NYHA class 3  Multiple rib fractures  Orthostasis  Myasthenia gravis  Pancreatic mass   1. Lung contusion, with chest pain, and multiple extruded, increased dyspnea-concern for pulmonary hemorrhage versus  beginning of infection superimposed on trauma. Patient also has been on chronic Coumadin which probably makes matter worse after the fall. I would suggest we admit the patient to the hospital and start IV antibiotics. 2. Acute on chronic diastolic congestive heart failure-gently diurese given the patient's chronic orthostasis. 3. Chronic atrial fibrillation-resume digoxin-for now we'll hold Coumadin. We'll talk to primary cardiologist tomorrow if it's safe to resume 4. History of orthostasis-we'll continue Florinef for now and midodrine as needed 5. Incidental finding of a pancreatic mass-will need to have a good discussion with the patient tomorrow. He cannot have an MRI because of his pacemaker,  so only option would be ERCP or exploratory laparotomy or EUS 6. History myasthenia gravis-continue Mestinon 7. Patient needs physical therapy evaluation to see if he needs short-term rehabilitation  Code Status: full Family Communication: daughter at bedside  Disposition Plan: ?  Time spent: 1 hour  Daymein Nunnery [facility] Pager [phone]  If 7PM-7AM, please contact night-coverage [email or web address] Password [facility] 04/19/2012, 7:09 PM

## 2012-04-19 NOTE — ED Notes (Signed)
From PCP via EMS, had a fall 1w ago and c/o weakness today, bruising to right flank per EMS, VSS, c/o back pain on arrival< NAD

## 2012-04-19 NOTE — ED Notes (Signed)
Patient transported to X-ray. Pt stable and ready for transport. 

## 2012-04-19 NOTE — Patient Instructions (Addendum)
78 yo married man (wife in [place] because of weakness) who on Sept 2nd fell and apparently fractured a right rib.  He subsequently gained about 5 lbs and has been getting progressively weaker.    We increased his lasix on Monday because of increasing edema and inability to lie flat.  Since then, he has become somewhat dizzy, more short of breath, and weaker.  His weight is down 2.5 lbs according to his daughters.  They note increasing ecchymosis on right flank.  Also, he is not eating and coughing more with productive yellow phlegm today.  Objective:  NAD.  Fingers cool and nail beds dusky.  He is alert and cooperative. HEENT:  No acute changes Chest:  Decreased breath sounds Heart:  Irreg, irreg with distant heart sounds and II/VI systolic murmur best heart over sternum Ext:  3+ pedal edema without skin breaks Skin:  Large confluent ecchymosis right flank. [facility] reading (PRIMARY) by  Dr. Terin Dierolf:  CXR: poor quality, poor inspiration, cardiomegaly with increased fluid markings c/w CHF Results for orders placed in visit on 04/19/12  POCT CBC      Component Value Range   WBC 11.5 (*) 4.6 - 10.2 K/uL   Lymph, poc 1.4  0.6 - 3.4   POC LYMPH PERCENT 12.3  10 - 50 %L   MID (cbc) 1.1 (*) 0 - 0.9   POC MID % 9.3  0 - 12 %M   POC Granulocyte 9.0 (*) 2 - 6.9   Granulocyte percent 78.4  37 - 80 %G   RBC 4.54 (*) 4.69 - 6.13 M/uL   Hemoglobin 14.0 (*) 14.1 - 18.1 g/dL   HCT, POC 46.1  43.5 - 53.7 %   MCV 101.6 (*) 80 - 97 fL   MCH, POC 30.8  27 - 31.2 pg   MCHC 30.4 (*) 31.8 - 35.4 g/dL   RDW, POC 14.2     Platelet Count, POC 265  142 - 424 K/uL   MPV 7.3  0 - 99.8 fL     Assessment:  CHF, weak, atrial fibrillation, high risk medications (Coumadin) with progressive ecchymosis, productive cough c/w CHF or early bronchitis  Plan:  Transfer to [facility] for further evaluation and probable admission.   

## 2012-04-19 NOTE — ED Provider Notes (Signed)
  Physical Exam  BP 122/61  Pulse 87  Temp 99 F (37.2 C) (Oral)  Resp 18  SpO2 98%  Physical Exam Patient hemodynamically stable, does have a wet sounding cough and some rales in lungs, 2+ lower extremity edema. Patient is feeling fine, denies any chest pain, nausea vomiting, lightheadedness at this time.  ED Course  Procedures  MDM Marco Gregory is a 78 y.o. male presenting with CHF exacerbation that was not amenable to home increase of Lasix. Patient was evaluated for fall while in the emergency department.  Patient is been stable while in the emergency department. Discussed findings of CT with patient and family - a decision will need to be made later on whether or not he is a surgical candidate and whether to pursue further diagnostic testing. Discussed with Dr. [name] for admission.      Jones Skene, MD 04/19/12 1818

## 2012-04-19 NOTE — ED Provider Notes (Signed)
History     CSN: [id number]  Arrival date & time 04/19/12  1337   First MD Initiated Contact with Patient 04/19/12 1348      Chief Complaint  Patient presents with  . Weakness    (Consider location/radiation/quality/duration/timing/severity/associated sxs/prior treatment) HPI Comments: Patient arrives from PCPs office with increasing weakness, inability to lay flat and shortness of breath over the past week. He sustained a fall on September 2 and was thought to have a right rib fracture. Since then he is at increased difficulty breathing, productive cough, generalized weakness. Denies any new falls. His PCP increase his Lasix a few days ago but he is not noticing improvement. Denies any fevers. He endorses worsening leg swelling and right flank pain where he has a large area of ecchymosis. No nausea or vomiting. Denies any focal weakness, numbness or tingling  The history is provided by the patient and the EMS personnel.    Past Medical History  Diagnosis Date  . Long-term (current) use of anticoagulants   . Glaucoma   . Gout   . Hyperthyroidism   . Orthostatic hypotension   . Redundant prepuce and phimosis   . Acute cystitis   . Hypertonicity of bladder   . HTN (hypertension)   . Atelectasis   . Dysautonomia   . Pacemaker     St. Jude; device generator replacement 2012  . CHF (congestive heart failure)     Previous preserved EF.  [name] Atrial fibrillation   . Bradyarrhythmia     bradycardia  . Pneumonia 08/2011    after hernia OR  . Hypothyroidism   . Sleep apnea     does not use cpap ; last used ~ 2011 (03/10/12)  . Spinal stenosis of lumbar region   . Chronic kidney disease [facility]     UTI  1 MO AGO TX MEDICALLY   . Osteomyelitis of toe of right foot ~ 2009    amputated 2nd toe    Past Surgical History  Procedure Date  . Cardioversion 12/19/2003    cardioversion and pacemaker interrogation  . Elbow bursa surgery 04/07/2006    Right elbow septic olecranon  bursitis  . Toe amputation ~ 2009    Osteomyelitis, right foot second toe /  Right foot second ray amputation  . Cardiac catheterization 07/03/2002    EF 60-70% /  Normal left ventricular function. / Very mild trivial three-vessel coronary atherosclerosis. / There is no mitral regurgitation noted  . Tonsillectomy and adenoidectomy     "when I was a youngster"  . Appendectomy     "when I was a youngster"  . Inguinal hernia repair ~ 2003    right  . Inguinal hernia repair 08/26/2011    Procedure: HERNIA REPAIR INGUINAL ADULT;  Surgeon: Kandis Cocking, MD;  Location: [facility] OR;  Service: General;  Laterality: Left;  open left inguinal hernia  . Insert / replace / remove pacemaker 07/04/2002 dr Graciela Husbands     dual chamber pacemaker - St. Jude model 1688 TC 58 cm active fixation lead was placed into the right ventricle and the St Jude model 1688 TC 52 cm active fixation pacing lead was placed in the right atrium.  The ventricular lead serial number was [id number] and the atrial lead serial number was [id number]/  Of note, the pacemaker generator was an identity [id number] serial number [id number]   . Insert / replace / remove pacemaker ~ 06/2011    St Jude  . Cataract extraction w/ intraocular  lens  implant, bilateral 01/2005  . Eye surgery     Family History  Problem Relation Age of Onset  . Cancer Mother 31    GYN    History  Substance Use Topics  . Smoking status: Former Smoker -- 1.0 packs/day for 25 years    Types: Cigarettes    Quit date: 08/09/1965  . Smokeless tobacco: Never Used  . Alcohol Use: No     "last alcohol ~ 2008"      Review of Systems  Constitutional: Negative for fever, activity change and appetite change.  HENT: Negative for congestion and rhinorrhea.   Eyes: Negative for visual disturbance.  Respiratory: Positive for cough and shortness of breath. Negative for chest tightness.   Cardiovascular: Negative for chest pain.  Gastrointestinal: Negative for nausea, vomiting and  abdominal pain.  Genitourinary: Negative for dysuria.  Musculoskeletal: Positive for back pain.  Skin: Negative for rash.  Neurological: Positive for weakness. Negative for dizziness, numbness and headaches.    Allergies  Review of patient's allergies indicates no known allergies.  Home Medications   Current Outpatient Rx  Name Route Sig Dispense Refill  . ACETAMINOPHEN 500 MG PO TABS Oral Take 500-1,000 mg by mouth every 6 (six) hours as needed. For pain    . ALBUTEROL SULFATE HFA 108 (90 BASE) MCG/ACT IN AERS Inhalation Inhale 1 puff into the lungs 2 (two) times daily. X 3 days, then continue as needed for shortness of breath or wheezing 1 Inhaler 3  . DIGOXIN 0.125 MG PO TABS Oral Take 125 mcg by mouth daily.    . DORZOLAMIDE HCL-TIMOLOL MAL 22.3-6.8 MG/ML OP SOLN Both Eyes Place 1 drop into both eyes daily.     [name] FINASTERIDE 5 MG PO TABS Oral Take 5 mg by mouth daily.     [name] FLUDROCORTISONE ACETATE 0.1 MG PO TABS Oral Take 0.1 mg by mouth daily.    . FUROSEMIDE 40 MG PO TABS Oral Take 1 tablet (40 mg total) by mouth daily. 30 tablet 3  . HYDROCODONE-ACETAMINOPHEN 5-300 MG PO TABS  1 tablet every 4 to 6 hours as needed for pain 30 each 0  . LEVOTHYROXINE SODIUM 150 MCG PO TABS Oral Take 1 tablet (150 mcg total) by mouth daily before breakfast. 30 tablet 3  . MIDODRINE HCL 5 MG PO TABS Oral Take 5 mg by mouth 3 (three) times daily as needed. When blood pressure is low    . ADULT MULTIVITAMIN W/MINERALS CH Oral Take 1 tablet by mouth daily.    [name] MUPIROCIN CALCIUM 2 % EX CREA Topical Apply topically 3 (three) times daily. 15 g 0  . OXYCODONE-ACETAMINOPHEN 5-325 MG PO TABS Oral Take 1 tablet by mouth every 8 (eight) hours as needed for pain. 20 tablet 0  . PYRIDOSTIGMINE BROMIDE 60 MG PO TABS Oral Take 60 mg by mouth 3 (three) times daily. Breakfast lunch and dinner    . SILODOSIN 8 MG PO CAPS Oral Take 8 mg by mouth daily with lunch.    . WARFARIN SODIUM 5 MG PO TABS Oral Take 2.5 mg by  mouth daily.       BP 137/62  Pulse 86  Temp 99 F (37.2 C) (Oral)  Resp 22  SpO2 97%  Physical Exam  Constitutional: He is oriented to person, place, and time. He appears well-developed and well-nourished. No distress.  HENT:  Head: Normocephalic and atraumatic.  Mouth/Throat: Oropharynx is clear and moist. No oropharyngeal exudate.  Eyes: Conjunctivae normal  and EOM are normal. Pupils are equal, round, and reactive to light.  Neck: Neck supple.  Cardiovascular: Normal rate and regular rhythm.   Pulmonary/Chest: Effort normal. He has rales.       Bibasilar rales  Abdominal: Soft. There is no tenderness. There is no rebound and no guarding.  Musculoskeletal: Normal range of motion. He exhibits edema. He exhibits no tenderness.       +2 pitting edema to mid tibia bilaterally Ecchymosis to right flank  Neurological: He is alert and oriented to person, place, and time. No cranial nerve deficit.  Skin: Skin is warm and dry.    ED Course  Procedures (including critical care time)   Labs Reviewed  CBC WITH DIFFERENTIAL  COMPREHENSIVE METABOLIC PANEL  TROPONIN I  PRO B NATRIURETIC PEPTIDE  PROTIME-INR  URINALYSIS, ROUTINE W REFLEX MICROSCOPIC  DIGOXIN LEVEL   No results found.   No diagnosis found.    MDM  Increasing dyspnea, shortness of breath, cough, or generalized weakness. 10 days status post fall with right rib ecchymosis and flank ecchymosis.  Mild volume overload on exam, no hypoxia, somewhat increased work of breathing. EKG unchanged, troponin negative, BNP elevated.  Lasix given. NO rib fractures seen on CXR, possible atelectasis or contusion at base.  CT chest and abdomen pending at time of sign out to Dr. Rulon Abide.    Date: 04/19/2012  Rate: 92  Rhythm: paced  QRS Axis: normal  Intervals: normal  ST/T Wave abnormalities: normal  Conduction Disutrbances:none  Narrative Interpretation:   Old EKG Reviewed: unchanged    Glynn Octave, MD 04/19/12  1743

## 2012-04-19 NOTE — Telephone Encounter (Signed)
bp 108/64 p 72 no fever, nurse states pts lungs were clear last week, pt has new at home fall, [facility] nurse is not hearing any air movement, Dr Patty Sermons not in office, Nurse states pts pcp has ordered a xray for next week, advised her to call PCP and see if they can get one for today, all agreed to plan. Will forward to nurse for update.

## 2012-04-20 DIAGNOSIS — G7000 Myasthenia gravis without (acute) exacerbation: Secondary | ICD-10-CM

## 2012-04-20 DIAGNOSIS — E876 Hypokalemia: Secondary | ICD-10-CM | POA: Diagnosis present

## 2012-04-20 LAB — CBC
MCV: 96.2 fL (ref 78.0–100.0)
WBC: 8.6 10*3/uL (ref 4.0–10.5)

## 2012-04-20 LAB — BASIC METABOLIC PANEL
BUN: 17 mg/dL (ref 6–23)
CO2: 33 mEq/L — ABNORMAL HIGH (ref 19–32)
Calcium: 9.5 mg/dL (ref 8.4–10.5)
Creatinine, Ser: 0.93 mg/dL (ref 0.50–1.35)

## 2012-04-20 LAB — MRSA PCR SCREENING: MRSA by PCR: NEGATIVE

## 2012-04-20 MED ORDER — GUAIFENESIN 100 MG/5ML PO SOLN
10.0000 mL | ORAL | Status: DC | PRN
  Administered 2012-04-20 – 2012-04-22 (×3): 200 mg via ORAL
  Filled 2012-04-20 (×3): qty 10

## 2012-04-20 MED ORDER — POTASSIUM CHLORIDE CRYS ER 20 MEQ PO TBCR
40.0000 meq | EXTENDED_RELEASE_TABLET | Freq: Once | ORAL | Status: AC
  Administered 2012-04-20: 40 meq via ORAL
  Filled 2012-04-20: qty 2

## 2012-04-20 MED ORDER — FUROSEMIDE 10 MG/ML IJ SOLN
20.0000 mg | Freq: Once | INTRAMUSCULAR | Status: AC
  Administered 2012-04-20: 20 mg via INTRAVENOUS
  Filled 2012-04-20: qty 2

## 2012-04-20 MED ORDER — GUAIFENESIN 100 MG/5ML PO SYRP
200.0000 mg | ORAL_SOLUTION | ORAL | Status: DC | PRN
  Filled 2012-04-20: qty 118

## 2012-04-20 NOTE — Progress Notes (Signed)
[facility] PROGRESS NOTE  Marco Gregory ZOX:[id number] DOB: January 25, 1921 DOA: 04/19/2012 PCP: Cassell Clement, MD  Assessment/Plan: Principal Problem:  *Lung contusion Active Problems:  OBSTRUCTIVE SLEEP APNEA  Atrial fibrillation  SICK SINUS/ TACHY-BRADY SYNDROME  DYSAUTONOMIA  PACEMAKER, PERMANENT  Overactive bladder  Hypertension  Status post inguinal hernia repair, 08/26/2011.  Acute on chronic diastolic CHF (congestive heart failure), NYHA class 3  Multiple rib fractures  Orthostasis  Myasthenia gravis  Pancreatic mass  Hypokalemia  1. Lung contusion - with low grade fever, cough with hemoptoic sputum and chest pain ? Some suprainfection - started on iv abx since admission levaquin 2. Fall with rib fractures and severe chest pain - requiring opiates daily - making dizziness and balance worse  3. Chronic orthostasis - TED hoses - florinef  4. CHF - hold off on diuresis due to hypotension . Limit po fluids 5. MG - c/w Mestinon 6. Pancreatic ductal mass - d/w daughter - will arrange outpt f/u with GI for EUS  7. afib s/p pacer  - c.w dig - cannot tolerate other rate control meds due to low BP. Dw. Dr. Shirlee Latch 9/12 - will stop coumadin for now due to falls and trauma 8. Weak and deconditioned - refer for snf   Code Status: full Family Communication: daughter Disposition Plan: snf   Brief narrative: 78 yo man admitted with falls, chest pain   Consultants:  none  Procedures:  none  Antibiotics:  levaquin 9/11  HPI/Subjective: Feels better   Objective: Filed Vitals:   04/19/12 1915 04/19/12 2046 04/20/12 0538 04/20/12 0906  BP: 121/68 127/68 103/73 80/43  Pulse: 79 83 77 75  Temp:  99.2 F (37.3 C) 99.1 F (37.3 C)   TempSrc:  Oral Oral   Resp: 15 18 18    Height:  6' (1.829 m)    Weight:  95.21 kg (209 lb 14.4 oz) 94.031 kg (207 lb 4.8 oz)   SpO2: 97% 91% 96%     Intake/Output Summary (Last 24 hours) at 04/20/12 1149 Last data filed at  04/20/12 1000  Gross per 24 hour  Intake    390 ml  Output   2155 ml  Net  -1765 ml   Filed Weights   04/19/12 2046 04/20/12 0538  Weight: 95.21 kg (209 lb 14.4 oz) 94.031 kg (207 lb 4.8 oz)    Exam:   General:  axox3  Cardiovascular: irreg irreg   Respiratory: Clear with exception of right base   Abdomen: soft, nt  Data Reviewed: Basic Metabolic Panel:  Lab 04/20/12 8119 04/19/12 1359  NA 137 136  K 3.0* 3.9  CL 96 95*  CO2 33* 32  GLUCOSE 121* 133*  BUN 17 18  CREATININE 0.93 1.04  CALCIUM 9.5 9.9  MG -- --  PHOS -- --   Liver Function Tests:  Lab 04/19/12 1359  AST 25  ALT 22  ALKPHOS 126*  BILITOT 1.5*  PROT 7.2  ALBUMIN 3.6   No results found for this basename: LIPASE:5,AMYLASE:5 in the last 168 hours No results found for this basename: AMMONIA:5 in the last 168 hours CBC:  Lab 04/20/12 0524 04/19/12 1359 04/19/12 1302  WBC 8.6 9.9 11.5*  NEUTROABS -- 7.4 --  HGB 12.4* 13.8 14.0*  HCT 37.9* 40.9 46.1  MCV 96.2 96.0 101.6*  PLT 172 183 --   Cardiac Enzymes:  Lab 04/19/12 1358  CKTOTAL --  CKMB --  CKMBINDEX --  TROPONINI <0.30   BNP (last 3 results)  Basename 04/19/12 1358 03/10/12 1259 08/30/11 0920  PROBNP 2303.0* 1840.0* 615.2*   CBG: No results found for this basename: GLUCAP:5 in the last 168 hours  Recent Results (from the past 240 hour(s))  MRSA PCR SCREENING     Status: Normal   Collection Time   04/20/12  2:55 AM      Component Value Range Status Comment   MRSA by PCR NEGATIVE  NEGATIVE Final      Studies: Dg Chest 1 View  04/19/2012  *RADIOLOGY REPORT*  Clinical Data: Chest pain and short of breath  CHEST - 1 VIEW  Comparison: 04/11/2012  Findings: Dual lead left subclavian pacemaker device is stable. Mild cardiomegaly.  Bibasilar atelectasis.  No Kerley B lines to suggest edema.  No pneumothorax.  IMPRESSION: Cardiomegaly without edema.  Bibasilar atelectasis.  Clinically significant discrepancy from primary report, if  provided: None   Original Report Authenticated By: Donavan Burnet, M.D.    Dg Chest 2 View  04/19/2012  *RADIOLOGY REPORT*  Clinical Data: Weakness.  Recent fall.  Dizziness.  Productive cough.  History of hypertension.  Pacemaker.  Atrial fibrillation.  CHEST - 2 VIEW  Comparison: 04/19/2012 at 1223 hours.  Findings: 1435 hours.  Mild hyperinflation. Lateral view degraded by patient arm position.  Pacer with leads at right atrium and right ventricle.  No lead discontinuity.  The patient is rotated right.  Moderate cardiomegaly.  Possible trace bilateral pleural effusions versus pleural thickening. No pneumothorax.  No congestive failure.  Slight worsening right base and persistent left base air space disease.  IMPRESSION:  1.  Cardiomegaly without congestive failure. 2.  Cannot exclude trace bilateral pleural effusions.  Increased right and similar left base airspace disease.  Favor atelectasis. Developing infection or aspiration cannot be excluded.   Original Report Authenticated By: Consuello Bossier, M.D.    Ct Head Wo Contrast  04/19/2012  *RADIOLOGY REPORT*  Clinical Data: Fall on 09/03.  Increasing weakness.  On blood thinners.  CT HEAD WITHOUT CONTRAST  Technique:  Contiguous axial images were obtained from the base of the skull through the vertex without contrast.  Comparison: 06/12/2009  Findings: Bone windows demonstrate motion degradation, despite 2 attempts.  Given this factor, no definite soft tissue swelling. Mucosal thickening of left maxillary sinus is mild. Clear mastoid air cells.  Soft tissue windows demonstrate normal cerebral volume for age. Given the extent of motion artifact, no  mass lesion, hemorrhage, hydrocephalus, acute infarct, intra-axial, or extra-axial fluid collection.  .  IMPRESSION:  1.  Motion degradation, despite 2 attempts. 2.  Given this factor, no acute intracranial abnormality. 3.  If high high clinical suspicion of acute intracranial abnormality, consider repeat exam  after when the patient is able to remain still.   Original Report Authenticated By: Consuello Bossier, M.D.    Ct Chest W Contrast  04/19/2012  *RADIOLOGY REPORT*  Clinical Data:  Fall, right flank pain.  Bruising to right flank. Shortness of breath.  CT CHEST, ABDOMEN AND PELVIS WITH CONTRAST  Technique:  Multidetector CT imaging of the chest, abdomen and pelvis was performed following the standard protocol during bolus administration of intravenous contrast.  Contrast: OMNIPAQUE IOHEXOL 300 MG/ML  SOLN  Comparison:  CT abdomen and pelvis12/01/2010.  Chest CT 08/31/2011.  CT CHEST  Findings:  Multiple right-sided the posterior lateral rib fractures, involving the 8th through 11th right ribs. Consolidation noted in the right lower lobe.  This is presumably contusion.  No pleural effusion or pneumothorax.  There is cardiomegaly.  Coronary artery calcifications.  Aorta is normal caliber. No mediastinal, hilar, or axillary adenopathy. Visualized thyroid and chest wall soft tissues unremarkable.  IMPRESSION: Right 8th through 11th posterolateral rib fractures.  No associated pneumothorax.  Airspace consolidation throughout much of the right lower lobe, presumably contusion.  CT ABDOMEN AND PELVIS  Findings:   Liver, gallbladder, spleen, adrenals, kidneys unremarkable except for small bilateral nonobstructing lower pole stones.  No hydronephrosis.  The pancreas is atrophic with dilated pancreatic duct.  No visible obstructing mass seen.  However, no calcifications are visualized to suggest this is related to chronic pancreatitis.  I suspect this may be related to an intraductal lesion (IPMN).  If it is felt that the patient is a surgical candidate, further evaluation with MRI may be helpful.  Large and small bowel grossly unremarkable.  Urinary bladder unremarkable.  Aorta and iliac vessels are calcified.  Mild aneurysmal dilatation of the infrarenal abdominal aorta, measuring 3.1 cm maximally.  No acute bony  abnormality.  IMPRESSION:  No evidence of acute abnormality in the abdomen or pelvis.  Pancreatic ductal dilatation with pancreatic atrophy.  Appearance concerning for intraductal neoplasm (IPMN).  If the patient is felt to be a surgical candidate, further evaluation and characterization with an MRI without and with contrast could be performed.   Original Report Authenticated By: Cyndie Chime, M.D.    Ct Abdomen Pelvis W Contrast  04/19/2012  *RADIOLOGY REPORT*  Clinical Data:  Fall, right flank pain.  Bruising to right flank. Shortness of breath.  CT CHEST, ABDOMEN AND PELVIS WITH CONTRAST  Technique:  Multidetector CT imaging of the chest, abdomen and pelvis was performed following the standard protocol during bolus administration of intravenous contrast.  Contrast: OMNIPAQUE IOHEXOL 300 MG/ML  SOLN  Comparison:  CT abdomen and pelvis12/01/2010.  Chest CT 08/31/2011.  CT CHEST  Findings:  Multiple right-sided the posterior lateral rib fractures, involving the 8th through 11th right ribs. Consolidation noted in the right lower lobe.  This is presumably contusion.  No pleural effusion or pneumothorax.  There is cardiomegaly.  Coronary artery calcifications.  Aorta is normal caliber. No mediastinal, hilar, or axillary adenopathy. Visualized thyroid and chest wall soft tissues unremarkable.  IMPRESSION: Right 8th through 11th posterolateral rib fractures.  No associated pneumothorax.  Airspace consolidation throughout much of the right lower lobe, presumably contusion.  CT ABDOMEN AND PELVIS  Findings:   Liver, gallbladder, spleen, adrenals, kidneys unremarkable except for small bilateral nonobstructing lower pole stones.  No hydronephrosis.  The pancreas is atrophic with dilated pancreatic duct.  No visible obstructing mass seen.  However, no calcifications are visualized to suggest this is related to chronic pancreatitis.  I suspect this may be related to an intraductal lesion (IPMN).  If it is felt that  the patient is a surgical candidate, further evaluation with MRI may be helpful.  Large and small bowel grossly unremarkable.  Urinary bladder unremarkable.  Aorta and iliac vessels are calcified.  Mild aneurysmal dilatation of the infrarenal abdominal aorta, measuring 3.1 cm maximally.  No acute bony abnormality.  IMPRESSION:  No evidence of acute abnormality in the abdomen or pelvis.  Pancreatic ductal dilatation with pancreatic atrophy.  Appearance concerning for intraductal neoplasm (IPMN).  If the patient is felt to be a surgical candidate, further evaluation and characterization with an MRI without and with contrast could be performed.   Original Report Authenticated By: Cyndie Chime, M.D.     Scheduled Meds:    .  albuterol  1 puff Inhalation BID  . digoxin  125 mcg Oral Daily  . dorzolamide-timolol  1 drop Both Eyes Daily  . finasteride  5 mg Oral Daily  . fludrocortisone  0.1 mg Oral Daily  . furosemide  40 mg Intravenous Once  . levofloxacin (LEVAQUIN) IV  500 mg Intravenous Q24H  . levothyroxine  150 mcg Oral QAC breakfast  . multivitamin with minerals  1 tablet Oral Daily  . potassium chloride  40 mEq Oral Once  . pyridostigmine  60 mg Oral TID  . sodium chloride  3 mL Intravenous Q12H  . sodium chloride  3 mL Intravenous Q12H  . DISCONTD: furosemide  40 mg Intravenous Daily   Continuous Infusions:   Principal Problem:  *Lung contusion Active Problems:  OBSTRUCTIVE SLEEP APNEA  Atrial fibrillation  SICK SINUS/ TACHY-BRADY SYNDROME  DYSAUTONOMIA  PACEMAKER, PERMANENT  Overactive bladder  Hypertension  Status post inguinal hernia repair, 08/26/2011.  Acute on chronic diastolic CHF (congestive heart failure), NYHA class 3  Multiple rib fractures  Orthostasis  Myasthenia gravis  Pancreatic mass  Hypokalemia        Peggy Monk  [facility] Pager [phone]. If 8PM-8AM, please contact night-coverage at [email or web address], password [facility] 04/20/2012, 11:49 AM  LOS:  1 day

## 2012-04-20 NOTE — Progress Notes (Signed)
Pt c/o S.O.B. O2 sat 97 % rr 22 on 3 l n/c ascultated pts lungs some increase in crackles from earlier. Pt denies pain. HOB up Pt denies pain. Text page to Dr [name] to make aware of findings. Orders received Marisa Cyphers RN

## 2012-04-20 NOTE — Telephone Encounter (Signed)
Patient currently in the hospital 

## 2012-04-20 NOTE — Clinical Documentation Improvement (Signed)
CONFLICTING DOCUMENTATION CLARIFICATION QUERY  THIS DOCUMENT IS NOT A PERMANENT PART OF THE MEDICAL RECORD    Please update your documentation within the medical record to reflect your response to this query.                                                                                         04/20/12   Dr. [name] and/or Associates,  In a better effort to capture your patient's severity of illness, reflect appropriate length of stay and utilization of resources, a review of the patient medical record has revealed the following indicators:  "...was found to have 2 rib fractures and a significant lung contusion." Dollie Bressi  04/19/2012, 7:09 PM H&P   04/19/12   CT CHEST, ABDOMEN AND PELVIS WITH CONTRAST    Findings: Multiple right-sided the posterior lateral rib fractures, involving the 8th through 11th right ribs.  Consolidation noted in the right lower lobe. This is presumably contusion. No pleural effusion or pneumothorax. There is cardiomegaly. Coronary artery calcifications. Aorta is normal caliber. No mediastinal, hilar, or axillary adenopathy. Visualized thyroid and chest wall soft tissues unremarkable.  IMPRESSION:  Right 8th through 11th posterolateral rib fractures. No associated pneumothorax.  Airspace consolidation throughout much of the right lower lobe, presumably contusion.    Based on your clinical judgment, please clarify and document the Number of Rib Fractures - 2 or 4.     Reviewed:  no additional documentation provided  Thank You,  Jerral Ralph  RN BSN CCDS Certified Clinical Documentation Specialist: Cell   [phone]  Health Information Management [place]  TO RESPOND TO THE THIS QUERY, FOLLOW THE INSTRUCTIONS BELOW:  1. If needed, update documentation for the patient's encounter via the notes activity.  2. Access this query again and click edit on the In [employer].  3. After updating, or not, click F2 to complete all highlighted  (required) fields concerning your review. Select "additional documentation in the medical record" OR "no additional documentation provided".  4. Click Sign note button.  5. The deficiency will fall out of your In Basket *Please let us know if you are not able to complete this workflow by phone or e-mail (listed below).

## 2012-04-20 NOTE — Evaluation (Signed)
Physical Therapy Evaluation Patient Details Name: Marco Gregory MRN: [id number] DOB: [birth year] Today's Date: 04/20/2012 Time: 4098-1191 PT Time Calculation (min): 30 min  PT Assessment / Plan / Recommendation Clinical Impression  Patient admitted after fall with lung contusion and 2 rib fxs.  Currently limited by orthostasis.  Will benefit from PT to address endurance and balance issues.      PT Assessment  Patient needs continued PT services    Follow Up Recommendations  Skilled nursing facility;Supervision/Assistance - 24 hour    Barriers to Discharge Decreased caregiver support Patient's wife is in [facility] currently in therapy.  Feel patient needs a Rehab stay as well.    Equipment Recommendations  Defer to next venue    Recommendations for Other Services     Frequency Min 3X/week    Precautions / Restrictions Precautions Precautions: Fall Restrictions Weight Bearing Restrictions: No   Pertinent Vitals/Pain  Orthostatic BPs  Supine 110/62, 59 bpm  Sitting 83/51, 71 bpm  Standing 76/48, 77 bpm  Standing after 2 min Would not register   BP sitting in chair 99/58, 69 bpm; nursing agreed to allow patient to sit up in chair. Some pain     Mobility  Bed Mobility Bed Mobility: Rolling Right;Right Sidelying to Sit;Sitting - Scoot to Edge of Bed Rolling Right: 5: Supervision;With rail Right Sidelying to Sit: 5: Supervision;With rails;HOB elevated Sitting - Scoot to Edge of Bed: 4: Min guard Transfers Transfers: Sit to Stand;Stand to [employer] Transfers Sit to Stand: 4: Min guard;With upper extremity assist;From bed Stand to Sit: 4: Min guard;With upper extremity assist;To chair/3-in-1;With armrests Stand Pivot Transfers: 4: Min assist Details for Transfer Assistance: Patient needed cues for hand placement.  Patient also needed steadying assist during transfer with RW secondary to dizziness.     Ambulation/Gait Ambulation/Gait Assistance: Not tested  (comment) Stairs: No Wheelchair Mobility Wheelchair Mobility: No         PT Diagnosis: Generalized weakness  PT Problem List: Decreased activity tolerance;Decreased balance;Decreased mobility;Decreased knowledge of use of DME;Decreased safety awareness;Pain PT Treatment Interventions: DME instruction;Gait training;Functional mobility training;Therapeutic activities;Therapeutic exercise;Patient/family education;Balance training;Stair training   PT Goals Acute Rehab PT Goals PT Goal Formulation: With patient Time For Goal Achievement: 05/04/12 Potential to Achieve Goals: Good Pt will go Supine/Side to Sit: Independently PT Goal: Supine/Side to Sit - Progress: Goal set today Pt will go Sit to Stand: Independently PT Goal: Sit to Stand - Progress: Goal set today Pt will Transfer Bed to Chair/Chair to Bed: with modified independence PT Transfer Goal: Bed to Chair/Chair to Bed - Progress: Goal set today Pt will Ambulate: 51 - 150 feet;with modified independence;with least restrictive assistive device PT Goal: Ambulate - Progress: Goal set today Pt will Go Up / Down Stairs: 1-2 stairs;with supervision;with least restrictive assistive device PT Goal: Up/Down Stairs - Progress: Goal set today  Visit Information  Last PT Received On: 04/20/12 Assistance Needed: +1    Subjective Data  Subjective: "I feel dizzy," patient stated upon standing. Patient Stated Goal: To go home   Prior Functioning  Home Living Lives With: Spouse (wife currently at [facility]) Available Help at Discharge:  (Trying to get a sitter agency to help 4 hours day) Type of Home: House Home Access: Stairs to enter [employer] of Steps: 2 Entrance Stairs-Rails: Can reach both Home Layout: Multi-level;Able to live on main level with bedroom/bathroom Bathroom Shower/Tub: Health visitor: Standard Home Adaptive Equipment: Grab bars in shower;Shower chair without back;Straight cane;Walker -  rolling;Wheelchair - manual Prior Function Level of Independence: Independent with assistive device(s) Able to Take Stairs?: Yes Driving: Yes Vocation: Retired Musician: No difficulties Dominant Hand: Right    Cognition  Overall Cognitive Status: Appears within functional limits for tasks assessed/performed Arousal/Alertness: Awake/alert Orientation Level: Appears intact for tasks assessed Behavior During Session: [facility] : [facility] for tasks performed    Extremity/Trunk Assessment Right Upper Extremity Assessment RUE ROM/Strength/Tone: [facility] for tasks assessed Left Upper Extremity Assessment LUE ROM/Strength/Tone: WFL for tasks assessed Right Lower Extremity Assessment RLE ROM/Strength/Tone: [facility] for tasks assessed Left Lower Extremity Assessment LLE ROM/Strength/Tone: WFL for tasks assessed   Balance Static Standing Balance Static Standing - Balance Support: Bilateral upper extremity supported;During functional activity Static Standing - Level of Assistance: 5: Stand by assistance Static Standing - Comment/# of Minutes: 1 minute   End of Session PT - End of Session Equipment Utilized During Treatment: Gait belt Activity Tolerance: Patient tolerated treatment well (limited by orthostasis) Patient left: in chair;with call bell/phone within reach Nurse Communication: Mobility status (nursing notified of orthostasis)       INGOLD,Seara Hinesley 04/20/2012, 12:07 PM  Alexxus Sobh Ingold,PT Acute Rehabilitation [phone] (pager)

## 2012-04-20 NOTE — Clinical Social Work Psychosocial (Signed)
Clinical Social Work Department BRIEF PSYCHOSOCIAL ASSESSMENT 04/20/2012  Patient:  Marco Gregory, Marco Gregory     Account Number:  [id number]     Admit date:  04/19/2012  Clinical Social Worker:  Juliette Mangle  Date/Time:  04/20/2012 01:56 PM  Referred by:  Physician  Date Referred:  04/20/2012 Referred for  SNF Placement   Other Referral:   Interview type:  Patient Other interview type:    PSYCHOSOCIAL DATA Living Status:  WIFE Admitted from facility:   Level of care:   Primary support name:  Marco Gregory,Marco Gregory Primary support relationship to patient:  CHILD, ADULT Degree of support available:   Strong and vested    CURRENT CONCERNS Current Concerns  Post-Acute Placement   Other Concerns:    SOCIAL WORK ASSESSMENT / PLAN Clinical Social Worker received referral for the potential need for post acute placement. CSW reviewed chart and met with patient at bedside. CSW introduced self, explained role, and provided emotional support. CSW discussed skilled nursing home placement and reviewed the process of placement and answered all the patient's questions. Patient reported that he is agreeable to CSW seeking placement. Patient reported that his wife is currently at [facility] place and would like to be placed at [facility] if possible. CSW encouraged patient to ask questions as needed of CSW and medical staff. CSW will begin the placement process and will continue to follow and assist with all d/c planning.   Assessment/plan status:  Psychosocial Support/Ongoing Assessment of Needs Other assessment/ plan:   Information/referral to community resources:   CSW provided patient with choice list of SNF    PATIENT'S/FAMILY'S RESPONSE TO PLAN OF CARE: Patient was a little groggy but did express his appreciation for information, resources and support provided by CSW.    Sabino Niemann, MSW, [employer] [phone]

## 2012-04-21 ENCOUNTER — Encounter: Payer: Self-pay | Admitting: Gastroenterology

## 2012-04-21 DIAGNOSIS — R079 Chest pain, unspecified: Secondary | ICD-10-CM

## 2012-04-21 DIAGNOSIS — J209 Acute bronchitis, unspecified: Secondary | ICD-10-CM

## 2012-04-21 LAB — BASIC METABOLIC PANEL
CO2: 35 mEq/L — ABNORMAL HIGH (ref 19–32)
Chloride: 94 mEq/L — ABNORMAL LOW (ref 96–112)
Sodium: 136 mEq/L (ref 135–145)

## 2012-04-21 MED ORDER — FUROSEMIDE 10 MG/ML IJ SOLN
20.0000 mg | Freq: Once | INTRAMUSCULAR | Status: AC
  Administered 2012-04-21: 20 mg via INTRAVENOUS

## 2012-04-21 MED ORDER — MIDODRINE HCL 5 MG PO TABS: 5.0000 mg | ORAL_TABLET | Freq: Three times a day (TID) | ORAL | Status: DC

## 2012-04-21 MED ORDER — ACETAMINOPHEN 500 MG PO TABS
500.0000 mg | ORAL_TABLET | Freq: Four times a day (QID) | ORAL | Status: DC
  Administered 2012-04-21 – 2012-04-22 (×5): 500 mg via ORAL
  Filled 2012-04-21 (×8): qty 1

## 2012-04-21 MED ORDER — POTASSIUM CHLORIDE ER 10 MEQ PO TBCR: 10.0000 meq | EXTENDED_RELEASE_TABLET | Freq: Two times a day (BID) | ORAL | Status: DC

## 2012-04-21 MED ORDER — LEVOFLOXACIN 500 MG PO TABS: 500.0000 mg | ORAL_TABLET | Freq: Every day | ORAL | Status: AC

## 2012-04-21 MED ORDER — LIDOCAINE 5 % EX PTCH
1.0000 | MEDICATED_PATCH | CUTANEOUS | Status: DC
  Administered 2012-04-21 – 2012-04-22 (×2): 1 via TRANSDERMAL
  Filled 2012-04-21 (×2): qty 1

## 2012-04-21 MED ORDER — OXYCODONE-ACETAMINOPHEN 5-325 MG PO TABS: 1.0000 | ORAL_TABLET | Freq: Three times a day (TID) | ORAL | Status: AC | PRN

## 2012-04-21 MED ORDER — MIDODRINE HCL 5 MG PO TABS
5.0000 mg | ORAL_TABLET | Freq: Three times a day (TID) | ORAL | Status: DC
  Administered 2012-04-21 – 2012-04-22 (×3): 5 mg via ORAL
  Filled 2012-04-21 (×5): qty 1

## 2012-04-21 MED ORDER — LIDOCAINE 5 % EX PTCH: 1.0000 | MEDICATED_PATCH | CUTANEOUS | Status: AC

## 2012-04-21 MED ORDER — POTASSIUM CHLORIDE CRYS ER 20 MEQ PO TBCR
40.0000 meq | EXTENDED_RELEASE_TABLET | Freq: Once | ORAL | Status: AC
  Administered 2012-04-21: 40 meq via ORAL
  Filled 2012-04-21: qty 2

## 2012-04-21 MED ORDER — FUROSEMIDE 20 MG PO TABS: 10.0000 mg | ORAL_TABLET | Freq: Two times a day (BID) | ORAL | Status: DC

## 2012-04-21 NOTE — Progress Notes (Signed)
[facility] PROGRESS NOTE  Marco Gregory WJX:[id number] DOB: February 25, 1921 DOA: 04/19/2012 PCP: Cassell Clement, MD  Assessment/Plan: Principal Problem:  *Lung contusion Active Problems:  OBSTRUCTIVE SLEEP APNEA  Atrial fibrillation  SICK SINUS/ TACHY-BRADY SYNDROME  DYSAUTONOMIA  PACEMAKER, PERMANENT  Overactive bladder  Hypertension  Status post inguinal hernia repair, 08/26/2011.  Acute on chronic diastolic CHF (congestive heart failure), NYHA class 3  Multiple rib fractures  Orthostasis  Myasthenia gravis  Pancreatic mass  Hypokalemia  1. Lung contusion - with low grade fever, cough with hemoptoic sputum and chest pain ? Some suprainfection - started on iv abx since admission levaquin 2. Fall with rib fractures and severe chest pain - requiring opiates daily - making dizziness and balance worse. With Tylenol and Lidoderm patch on September 13  3. Chronic orthostasis - TED hoses - florinef and midodrine 4. CHF -continue with intermittent diuresis as blood pressure permits . Limit po fluids 5. MG - c/w Mestinon 6. Pancreatic ductal mass - d/w daughter - will arrange outpt f/u with GI for EUS  7. afib s/p pacer  - c.w dig - cannot tolerate other rate control meds due to low BP. Dw. Dr. Shirlee Latch 9/12 - will stop coumadin for now due to falls and trauma 8. Weak and deconditioned - refer for snf   Code Status: full Family Communication: daughter Disposition Plan: snf   Brief narrative: 78 yo man admitted with falls, chest pain   Consultants:  none  Procedures:  none  Antibiotics:  levaquin 9/11  HPI/Subjective: Still having chest pains  Objective: Filed Vitals:   04/21/12 0945 04/21/12 1438 04/21/12 1500 04/21/12 1700  BP: 92/46 74/44 80/50  140/80  Pulse: 68 65 66 66  Temp:  97.6 F (36.4 C)    TempSrc:  Oral    Resp:  20 20 20   Height:      Weight:      SpO2:  90%  94%    Intake/Output Summary (Last 24 hours) at 04/21/12 1720 Last data  filed at 04/21/12 1500  Gross per 24 hour  Intake   2103 ml  Output   1050 ml  Net   1053 ml   Filed Weights   04/19/12 2046 04/20/12 0538 04/21/12 0526  Weight: 95.21 kg (209 lb 14.4 oz) 94.031 kg (207 lb 4.8 oz) 93.8 kg (206 lb 12.7 oz)    Exam:   General:  axox3  Cardiovascular: irreg irreg   Respiratory: Clear with exception of right base   Abdomen: soft, nt  Data Reviewed: Basic Metabolic Panel:  Lab 04/21/12 2130 04/20/12 0524 04/19/12 1359  NA 136 137 136  K 3.6 3.0* 3.9  CL 94* 96 95*  CO2 35* 33* 32  GLUCOSE 94 121* 133*  BUN 25* 17 18  CREATININE 1.10 0.93 1.04  CALCIUM 9.5 9.5 9.9  MG -- -- --  PHOS -- -- --   Liver Function Tests:  Lab 04/19/12 1359  AST 25  ALT 22  ALKPHOS 126*  BILITOT 1.5*  PROT 7.2  ALBUMIN 3.6   No results found for this basename: LIPASE:5,AMYLASE:5 in the last 168 hours No results found for this basename: AMMONIA:5 in the last 168 hours CBC:  Lab 04/20/12 0524 04/19/12 1359 04/19/12 1302  WBC 8.6 9.9 11.5*  NEUTROABS -- 7.4 --  HGB 12.4* 13.8 14.0*  HCT 37.9* 40.9 46.1  MCV 96.2 96.0 101.6*  PLT 172 183 --   Cardiac Enzymes:  Lab 04/19/12 1358  CKTOTAL --  CKMB --  CKMBINDEX --  TROPONINI <0.30   BNP (last 3 results)  Basename 04/19/12 1358 03/10/12 1259 08/30/11 0920  PROBNP 2303.0* 1840.0* 615.2*   CBG: No results found for this basename: GLUCAP:5 in the last 168 hours  Recent Results (from the past 240 hour(s))  MRSA PCR SCREENING     Status: Normal   Collection Time   04/20/12  2:55 AM      Component Value Range Status Comment   MRSA by PCR NEGATIVE  NEGATIVE Final      Studies: No results found.  Scheduled Meds:    . acetaminophen  500 mg Oral QID  . albuterol  1 puff Inhalation BID  . digoxin  125 mcg Oral Daily  . dorzolamide-timolol  1 drop Both Eyes Daily  . finasteride  5 mg Oral Daily  . fludrocortisone  0.1 mg Oral Daily  . furosemide  20 mg Intravenous Once  . levofloxacin  (LEVAQUIN) IV  500 mg Intravenous Q24H  . levothyroxine  150 mcg Oral QAC breakfast  . lidocaine  1 patch Transdermal Q24H  . midodrine  5 mg Oral TID WC  . multivitamin with minerals  1 tablet Oral Daily  . potassium chloride  40 mEq Oral Once  . pyridostigmine  60 mg Oral TID  . sodium chloride  3 mL Intravenous Q12H  . sodium chloride  3 mL Intravenous Q12H   Continuous Infusions:   Principal Problem:  *Lung contusion Active Problems:  OBSTRUCTIVE SLEEP APNEA  Atrial fibrillation  SICK SINUS/ TACHY-BRADY SYNDROME  DYSAUTONOMIA  PACEMAKER, PERMANENT  Overactive bladder  Hypertension  Status post inguinal hernia repair, 08/26/2011.  Acute on chronic diastolic CHF (congestive heart failure), NYHA class 3  Multiple rib fractures  Orthostasis  Myasthenia gravis  Pancreatic mass  Hypokalemia        Rocio Wolak  [facility] Pager [phone]. If 8PM-8AM, please contact night-coverage at [email or web address], password [facility] 04/21/2012, 5:20 PM  LOS: 2 days

## 2012-04-21 NOTE — Discharge Summary (Addendum)
Physician Discharge Summary  Marco Gregory:[id number] DOB: 08-02-1921 DOA: 04/19/2012  PCP: Cassell Clement, MD  Admit date: 04/19/2012 Discharge date: 04/21/2012  Recommendations for Outpatient Follow-up:  1. Follow up with Dr. Patty Sermons to decide if he should resume coumadin after rehab 2. Follow up with Dr. Amie Critchley for incidental pancreatic duct spot - maybe he needs EUS  Discharge Diagnoses:  Principal Problem:  *Lung contusion Active Problems:  OBSTRUCTIVE SLEEP APNEA  Atrial fibrillation  SICK SINUS/ TACHY-BRADY SYNDROME  DYSAUTONOMIA  PACEMAKER, PERMANENT  Overactive bladder  Hypertension  Status post inguinal hernia repair, 08/26/2011.  Acute on chronic diastolic CHF (congestive heart failure), NYHA class 3  Multiple rib fractures  Orthostasis  Myasthenia gravis  Pancreatic mass  Hypokalemia   Discharge Condition: good  Diet recommendation: heart healthy  Filed Weights   04/19/12 2046 04/20/12 0538 04/21/12 0526  Weight: 95.21 kg (209 lb 14.4 oz) 94.031 kg (207 lb 4.8 oz) 93.8 kg (206 lb 12.7 oz)    History of present illness:  Marco Gregory is a 78 y.o. male with past medical history of orthostatic hypotension, myasthenia gravis on Mestinon, diastolic congestive heart failure, who sustained a fall one week prior to admission. After the fall he has been having difficulty with breathing and significant chest pain. Today he presented to the emergency room and was found to have 2 rib fractures and a significant lung contusion. It was also noticed that the patient was volume overloaded despite the increase dose of lasix. No swelling he coughed up dark sputum.    Hospital Course:  1. Lung contusion - with low grade fever, cough with hemoptoic sputum and chest pain ? Some suprainfection - started on iv abx since admission levaquin Qd - to finish course at snf in 4 days. Afebrile, hemodynamically  stable  2. Fall with rib fractures and severe chest pain  - requiring opiates daily - making dizziness and balance worse - added lidocaine patch and tylenol around the clock on 9/13 - continue at snf  3. Chronic orthostasis - TED hoses - florinef, midodrine continued . Stopped Rapaflo  4. CHF acute on chronic diastolic  - started iv lasix on admit then we had to hold off on diuresis due to hypotension . Low dose daily lasix to control volume.  5. Myasthenia Gravis  - c/w Mestinon 6. Pancreatic ductal shadow on Ct scan  - d/w daughter - no intervention for now - havel arrange outpt f/u with  GI for consideration of EUS  7. afib s/p pacer - c.w dig - cannot tolerate other rate control meds due to low BP. Dw. Dr. Shirlee Latch 9/12 - will stop coumadin for now due to falls and trauma 8. Weak and deconditioned - refer for snf      Procedures:  CT chest abdomen and pelvis  Consultations:  none  Discharge Exam: Filed Vitals:   04/20/12 2036 04/20/12 2119 04/21/12 0526 04/21/12 0945  BP: 113/64  132/68 92/46  Pulse: 70 75 59 68  Temp: 97.9 F (36.6 C)  97.4 F (36.3 C)   TempSrc: Oral  Oral   Resp: 22 20 22    Height:      Weight:   93.8 kg (206 lb 12.7 oz)   SpO2: 98%  99%    Alert and oriented x3 CVS: irreg irreg  RS minimal crackles at right base   Discharge Instructions  Discharge Orders    Future Appointments: Provider: Department: Dept Phone: Center:   05/09/2012 10:00  AM Duke Salvia, MD [facility] [phone] [id number]   05/19/2012 11:00 AM Rachael Fee, MD [facility] [place] [facility] [phone] [facility]   06/08/2012 11:15 AM [facility] [phone] [facility]   06/08/2012 11:30 AM Cassell Clement, MD [facility] [phone] None   06/19/2012 1:45 PM Cassell Clement, MD [facility] [phone] None       Medication List     As of 04/21/2012  1:43 PM    STOP taking these medications         Hydrocodone-Acetaminophen 5-300 MG Tabs      silodosin 8 MG Caps  capsule   Commonly known as: RAPAFLO      warfarin 5 MG tablet   Commonly known as: COUMADIN      TAKE these medications         acetaminophen 500 MG tablet   Commonly known as: TYLENOL   Take 500-1,000 mg by mouth every 6 (six) hours as needed. For pain      albuterol 108 (90 BASE) MCG/ACT inhaler   Commonly known as: PROVENTIL HFA;VENTOLIN HFA   Inhale 1 puff into the lungs 2 (two) times daily. X 3 days, then continue as needed for shortness of breath or wheezing      digoxin 0.125 MG tablet   Commonly known as: LANOXIN   Take 125 mcg by mouth daily.      dorzolamide-timolol 22.3-6.8 MG/ML ophthalmic solution   Commonly known as: COSOPT   Place 1 drop into both eyes daily.      finasteride 5 MG tablet   Commonly known as: PROSCAR   Take 5 mg by mouth daily.      fludrocortisone 0.1 MG tablet   Commonly known as: FLORINEF   Take 0.1 mg by mouth daily.      furosemide 20 MG tablet   Commonly known as: LASIX   Take 0.5 tablets (10 mg total) by mouth 2 (two) times daily.      levofloxacin 500 MG tablet   Commonly known as: LEVAQUIN   Take 1 tablet (500 mg total) by mouth daily.   Start taking on: 04/22/2012      levothyroxine 150 MCG tablet   Commonly known as: SYNTHROID, LEVOTHROID   Take 1 tablet (150 mcg total) by mouth daily before breakfast.      lidocaine 5 %   Commonly known as: LIDODERM   Place 1 patch onto the skin daily. Remove & Discard patch within 12 hours or as directed by MD      midodrine 5 MG tablet   Commonly known as: PROAMATINE   Take 1 tablet (5 mg total) by mouth 3 (three) times daily. When blood pressure is low      multivitamin with minerals Tabs   Take 1 tablet by mouth daily.      oxyCODONE-acetaminophen 5-325 MG per tablet   Commonly known as: PERCOCET/ROXICET   Take 1 tablet by mouth every 8 (eight) hours as needed for pain.      potassium chloride 10 MEQ tablet   Commonly known as: K-DUR   Take 1 tablet (10 mEq total) by mouth 2  (two) times daily.      pyridostigmine 60 MG tablet   Commonly known as: MESTINON   Take 60 mg by mouth 3 (three) times daily. Breakfast lunch and dinner           Follow-up Information    Schedule an appointment as soon as possible for a  visit with Cassell Clement, MD.   Contact information:   [address] [place] [address] [phone]       Follow up with Rob Bunting, MD. On 05/19/2012. (11:00 AM)    Contact information:   [address] [facility] [place] [address] [phone]           The results of significant diagnostics from this hospitalization (including imaging, microbiology, ancillary and laboratory) are listed below for reference.    Significant Diagnostic Studies: Dg Chest 1 View  04/19/2012  *RADIOLOGY REPORT*  Clinical Data: Chest pain and short of breath  CHEST - 1 VIEW  Comparison: 04/11/2012  Findings: Dual lead left subclavian pacemaker device is stable. Mild cardiomegaly.  Bibasilar atelectasis.  No Kerley B lines to suggest edema.  No pneumothorax.  IMPRESSION: Cardiomegaly without edema.  Bibasilar atelectasis.  Clinically significant discrepancy from primary report, if provided: None   Original Report Authenticated By: Donavan Burnet, M.D.    Dg Chest 2 View  04/19/2012  *RADIOLOGY REPORT*  Clinical Data: Weakness.  Recent fall.  Dizziness.  Productive cough.  History of hypertension.  Pacemaker.  Atrial fibrillation.  CHEST - 2 VIEW  Comparison: 04/19/2012 at 1223 hours.  Findings: 1435 hours.  Mild hyperinflation. Lateral view degraded by patient arm position.  Pacer with leads at right atrium and right ventricle.  No lead discontinuity.  The patient is rotated right.  Moderate cardiomegaly.  Possible trace bilateral pleural effusions versus pleural thickening. No pneumothorax.  No congestive failure.  Slight worsening right base and persistent left base air space disease.  IMPRESSION:  1.  Cardiomegaly without  congestive failure. 2.  Cannot exclude trace bilateral pleural effusions.  Increased right and similar left base airspace disease.  Favor atelectasis. Developing infection or aspiration cannot be excluded.   Original Report Authenticated By: Consuello Bossier, M.D.    Dg Chest 2 View  04/11/2012  *RADIOLOGY REPORT*  Clinical Data: Fall  CHEST - 2 VIEW  Comparison: 03/10/2012  Findings: Moderate cardiomegaly.  Dual lead left subclavian pacemaker device and leads are stable.  Low volumes.  Bibasilar atelectasis.  No pneumothorax.  No obvious acute rib fracture. Stable thoracic spine.  IMPRESSION: Cardiomegaly and bibasilar atelectasis.  No active cardiopulmonary disease.   Original Report Authenticated By: Donavan Burnet, M.D.    Ct Head Wo Contrast  04/19/2012  *RADIOLOGY REPORT*  Clinical Data: Fall on 09/03.  Increasing weakness.  On blood thinners.  CT HEAD WITHOUT CONTRAST  Technique:  Contiguous axial images were obtained from the base of the skull through the vertex without contrast.  Comparison: 06/12/2009  Findings: Bone windows demonstrate motion degradation, despite 2 attempts.  Given this factor, no definite soft tissue swelling. Mucosal thickening of left maxillary sinus is mild. Clear mastoid air cells.  Soft tissue windows demonstrate normal cerebral volume for age. Given the extent of motion artifact, no  mass lesion, hemorrhage, hydrocephalus, acute infarct, intra-axial, or extra-axial fluid collection.  .  IMPRESSION:  1.  Motion degradation, despite 2 attempts. 2.  Given this factor, no acute intracranial abnormality. 3.  If high high clinical suspicion of acute intracranial abnormality, consider repeat exam after when the patient is able to remain still.   Original Report Authenticated By: Consuello Bossier, M.D.    Ct Chest W Contrast  04/19/2012  *RADIOLOGY REPORT*  Clinical Data:  Fall, right flank pain.  Bruising to right flank. Shortness of breath.  CT  CHEST, ABDOMEN AND PELVIS WITH CONTRAST   Technique:  Multidetector CT imaging of the chest, abdomen and pelvis was performed following the standard protocol during bolus administration of intravenous contrast.  Contrast: OMNIPAQUE IOHEXOL 300 MG/ML  SOLN  Comparison:  CT abdomen and pelvis12/01/2010.  Chest CT 08/31/2011.  CT CHEST  Findings:  Multiple right-sided the posterior lateral rib fractures, involving the 8th through 11th right ribs. Consolidation noted in the right lower lobe.  This is presumably contusion.  No pleural effusion or pneumothorax.  There is cardiomegaly.  Coronary artery calcifications.  Aorta is normal caliber. No mediastinal, hilar, or axillary adenopathy. Visualized thyroid and chest wall soft tissues unremarkable.  IMPRESSION: Right 8th through 11th posterolateral rib fractures.  No associated pneumothorax.  Airspace consolidation throughout much of the right lower lobe, presumably contusion.  CT ABDOMEN AND PELVIS  Findings:   Liver, gallbladder, spleen, adrenals, kidneys unremarkable except for small bilateral nonobstructing lower pole stones.  No hydronephrosis.  The pancreas is atrophic with dilated pancreatic duct.  No visible obstructing mass seen.  However, no calcifications are visualized to suggest this is related to chronic pancreatitis.  I suspect this may be related to an intraductal lesion (IPMN).  If it is felt that the patient is a surgical candidate, further evaluation with MRI may be helpful.  Large and small bowel grossly unremarkable.  Urinary bladder unremarkable.  Aorta and iliac vessels are calcified.  Mild aneurysmal dilatation of the infrarenal abdominal aorta, measuring 3.1 cm maximally.  No acute bony abnormality.  IMPRESSION:  No evidence of acute abnormality in the abdomen or pelvis.  Pancreatic ductal dilatation with pancreatic atrophy.  Appearance concerning for intraductal neoplasm (IPMN).  If the patient is felt to be a surgical candidate, further evaluation and characterization with an MRI  without and with contrast could be performed.   Original Report Authenticated By: Cyndie Chime, M.D.    Ct Abdomen Pelvis W Contrast  04/19/2012  *RADIOLOGY REPORT*  Clinical Data:  Fall, right flank pain.  Bruising to right flank. Shortness of breath.  CT CHEST, ABDOMEN AND PELVIS WITH CONTRAST  Technique:  Multidetector CT imaging of the chest, abdomen and pelvis was performed following the standard protocol during bolus administration of intravenous contrast.  Contrast: OMNIPAQUE IOHEXOL 300 MG/ML  SOLN  Comparison:  CT abdomen and pelvis12/01/2010.  Chest CT 08/31/2011.  CT CHEST  Findings:  Multiple right-sided the posterior lateral rib fractures, involving the 8th through 11th right ribs. Consolidation noted in the right lower lobe.  This is presumably contusion.  No pleural effusion or pneumothorax.  There is cardiomegaly.  Coronary artery calcifications.  Aorta is normal caliber. No mediastinal, hilar, or axillary adenopathy. Visualized thyroid and chest wall soft tissues unremarkable.  IMPRESSION: Right 8th through 11th posterolateral rib fractures.  No associated pneumothorax.  Airspace consolidation throughout much of the right lower lobe, presumably contusion.  CT ABDOMEN AND PELVIS  Findings:   Liver, gallbladder, spleen, adrenals, kidneys unremarkable except for small bilateral nonobstructing lower pole stones.  No hydronephrosis.  The pancreas is atrophic with dilated pancreatic duct.  No visible obstructing mass seen.  However, no calcifications are visualized to suggest this is related to chronic pancreatitis.  I suspect this may be related to an intraductal lesion (IPMN).  If it is felt that the patient is a surgical candidate, further evaluation with MRI may be helpful.  Large and small bowel grossly unremarkable.  Urinary bladder unremarkable.  Aorta and iliac vessels are  calcified.  Mild aneurysmal dilatation of the infrarenal abdominal aorta, measuring 3.1 cm maximally.  No acute  bony abnormality.  IMPRESSION:  No evidence of acute abnormality in the abdomen or pelvis.  Pancreatic ductal dilatation with pancreatic atrophy.  Appearance concerning for intraductal neoplasm (IPMN).  If the patient is felt to be a surgical candidate, further evaluation and characterization with an MRI without and with contrast could be performed.   Original Report Authenticated By: Cyndie Chime, M.D.     Microbiology: Recent Results (from the past 240 hour(s))  MRSA PCR SCREENING     Status: Normal   Collection Time   04/20/12  2:55 AM      Component Value Range Status Comment   MRSA by PCR NEGATIVE  NEGATIVE Final      Labs: Basic Metabolic Panel:  Lab 04/20/12 8295 04/19/12 1359  NA 137 136  K 3.0* 3.9  CL 96 95*  CO2 33* 32  GLUCOSE 121* 133*  BUN 17 18  CREATININE 0.93 1.04  CALCIUM 9.5 9.9  MG -- --  PHOS -- --   Liver Function Tests:  Lab 04/19/12 1359  AST 25  ALT 22  ALKPHOS 126*  BILITOT 1.5*  PROT 7.2  ALBUMIN 3.6   No results found for this basename: LIPASE:5,AMYLASE:5 in the last 168 hours No results found for this basename: AMMONIA:5 in the last 168 hours CBC:  Lab 04/20/12 0524 04/19/12 1359 04/19/12 1302  WBC 8.6 9.9 11.5*  NEUTROABS -- 7.4 --  HGB 12.4* 13.8 14.0*  HCT 37.9* 40.9 46.1  MCV 96.2 96.0 101.6*  PLT 172 183 --   Cardiac Enzymes:  Lab 04/19/12 1358  CKTOTAL --  CKMB --  CKMBINDEX --  TROPONINI <0.30   BNP: BNP (last 3 results)  Basename 04/19/12 1358 03/10/12 1259 08/30/11 0920  PROBNP 2303.0* 1840.0* 615.2*   CBG: No results found for this basename: GLUCAP:5 in the last 168 hours  Time coordinating discharge: 35 minutes  Signed:  Ashlie Mcmenamy  [facility] 04/21/2012, 1:43 PM

## 2012-04-21 NOTE — Care Management Note (Signed)
    Page 1 of 1   04/21/2012     11:02:43 AM   CARE MANAGEMENT NOTE 04/21/2012  Patient:  Marco Gregory, Marco Gregory   Account Number:  [id number]  Date Initiated:  04/21/2012  Documentation initiated by:  Tera Mater  Subjective/Objective Assessment:   78yo male admitted with S/P fall.  Pt. currently resides at [facility].     Action/Plan:   Discharge planning for possible SNF   Anticipated DC Date:  04/22/2012   Anticipated DC Plan:  SKILLED NURSING FACILITY  In-house referral  Clinical Social Worker         Choice offered to / List presented to:             Status of service:  Completed, signed off Medicare Important Message given?   (If response is "NO", the following Medicare IM given date fields will be blank) Date Medicare IM given:   Date Additional Medicare IM given:    Discharge Disposition:  SKILLED NURSING FACILITY  Per UR Regulation:  Reviewed for med. necessity/level of care/duration of stay  If discussed at Long Length of Stay Meetings, dates discussed:    Comments:  04/21/12 1100 Tera Mater, RN, BSN NCM [phone] PT has recommended SNF at discharge.  Consulted CSW for SNF placement.  Pt. [facility] currently resides at [facility], and pt. would like to go there at dc.  Anticipate dc tomorrow to SNF.

## 2012-04-21 NOTE — Progress Notes (Signed)
Pt c/o feeling SOB O2 sat 94 % on 3 L n/c. BP 140/80 HR 66  RR 20  Some crackles bilateral bases noted Text page to Dr [name]  Orders received

## 2012-04-21 NOTE — Progress Notes (Signed)
Physical Therapy Treatment Patient Details Name: Marco Gregory MRN: [id number] DOB: 1921/05/25 Today's Date: 04/21/2012 Time: 4098-1191 PT Time Calculation (min): 15 min  PT Assessment / Plan / Recommendation Comments on Treatment Session  Patient with improved ambulation, however needs cuing for safety.  Agree with need for SNF at discharge for continued therapy.    Follow Up Recommendations  Skilled nursing facility    Barriers to Discharge        Equipment Recommendations  Defer to next venue    Recommendations for Other Services    Frequency Min 3X/week   Plan Discharge plan remains appropriate;Frequency remains appropriate    Precautions / Restrictions Precautions Precautions: Fall Restrictions Weight Bearing Restrictions: No   Pertinent Vitals/Pain Pain limiting PT session    Mobility  Bed Mobility Bed Mobility: Not assessed Transfers Transfers: Sit to Stand;Stand to Sit Sit to Stand: 4: Min guard;With upper extremity assist;With armrests;From chair/3-in-1 Stand to Sit: 4: Min guard;With upper extremity assist;To bed Details for Transfer Assistance: Verbal cues for hand placement and safety. Ambulation/Gait Ambulation/Gait Assistance: 4: Min assist Ambulation Distance (Feet): 88 Feet Assistive device: Rolling walker Ambulation/Gait Assistance Details: Patient with flexed posture - cues to stand upright.  Verbal cues and physical assist to safely use RW.  Cues to stay close to RW and keep feet inside RW for safety. Gait Pattern: Step-through pattern;Decreased stride length;Trunk flexed;Wide base of support Gait velocity: Slow gait speed      PT Goals Acute Rehab PT Goals PT Goal: Sit to Stand - Progress: Progressing toward goal PT Transfer Goal: Bed to Chair/Chair to Bed - Progress: Progressing toward goal PT Goal: Ambulate - Progress: Progressing toward goal  Visit Information  Last PT Received On: 04/21/12 Assistance Needed: +1    Subjective  Data  Subjective: "I did better today"   Cognition  Overall Cognitive Status: Appears within functional limits for tasks assessed/performed Arousal/Alertness: Awake/alert Orientation Level: Appears intact for tasks assessed Behavior During Session: [facility] for tasks performed    Balance     End of Session PT - End of Session Equipment Utilized During Treatment: Gait belt Activity Tolerance: Patient limited by fatigue;Patient limited by pain Patient left: in bed;with call bell/phone within reach Nurse Communication: Mobility status   GP     Vena Austria 04/21/2012, 2:28 PM Durenda Hurt. Renaldo Fiddler, [facility] Acute Rehab Services Pager [phone]

## 2012-04-22 DIAGNOSIS — I4891 Unspecified atrial fibrillation: Secondary | ICD-10-CM

## 2012-04-22 NOTE — Progress Notes (Signed)
CSW was consulted to complete discharge of patient. Pt to transfer to [facility] today via [facility]. [facility] is aware of d/c. Patient's daughter, [name] [phone]) was in the patient's room when CSW notified her of patient's transport to [facility].  D/C packet complete with chart copy, signed FL2, and signed hard Rx.  CSW signing off as no other CSW needs identified at this time.  Lia Foyer, LCSWA [facility] Clinical Social Worker Contact #: [phone] (weekend)

## 2012-04-22 NOTE — Progress Notes (Signed)
Report called to [facility] nurse [place] RN . Paper work given to Engineer, maintenance . Pt transported to SNF via ambulance with pt belongings. VSS A&OX4.no distress noted.

## 2012-04-25 ENCOUNTER — Telehealth: Payer: Self-pay

## 2012-04-25 NOTE — Telephone Encounter (Signed)
The patient called to request that Dr. Milus Glazier return his call today.  Please call the patient at [phone].

## 2012-04-25 NOTE — Telephone Encounter (Signed)
I have spoken to patient and he has not spoken to Dr Milus Glazier since he has been in facility, he is at [facility] place and wants to know Dr Milus Glazier thoughts about where to proceed from here. He was in hospital for 3 days with rib fractures. Patient sounds SOB by phone, stated he wants to meet with Dr Milus Glazier. I think he is looking for a time when he can come in. Please advise.

## 2012-05-03 ENCOUNTER — Telehealth: Payer: Self-pay

## 2012-05-03 NOTE — Telephone Encounter (Signed)
JOYCE STATES HER FATHER WOULD LIKE DR KURT TO GIVE HIM A CALL NEXT WEEK WHEN HE COMES IN.  PLEASE CALL [phone] AND WOULD ONLY WANT DR Kenyon Ana TO CALL

## 2012-05-09 ENCOUNTER — Encounter: Payer: Medicare Other | Admitting: Internal Medicine

## 2012-05-18 ENCOUNTER — Ambulatory Visit: Payer: Medicare Other | Admitting: Internal Medicine

## 2012-05-19 ENCOUNTER — Ambulatory Visit: Payer: Medicare Other | Admitting: Gastroenterology

## 2012-05-27 ENCOUNTER — Other Ambulatory Visit: Payer: Self-pay | Admitting: Cardiology

## 2012-06-07 ENCOUNTER — Telehealth: Payer: Self-pay | Admitting: Family Medicine

## 2012-06-07 ENCOUNTER — Ambulatory Visit: Payer: Medicare Other

## 2012-06-07 ENCOUNTER — Ambulatory Visit (INDEPENDENT_AMBULATORY_CARE_PROVIDER_SITE_OTHER): Payer: Medicare Other | Admitting: Family Medicine

## 2012-06-07 VITALS — BP 89/52 | HR 64 | Temp 97.4°F | Resp 18 | Ht 73.0 in | Wt 204.8 lb

## 2012-06-07 DIAGNOSIS — R079 Chest pain, unspecified: Secondary | ICD-10-CM

## 2012-06-07 DIAGNOSIS — M542 Cervicalgia: Secondary | ICD-10-CM

## 2012-06-07 DIAGNOSIS — R609 Edema, unspecified: Secondary | ICD-10-CM

## 2012-06-07 LAB — POCT CBC
Granulocyte percent: 62.6 %G (ref 37–80)
HCT, POC: 45.2 % (ref 43.5–53.7)
Hemoglobin: 13.7 g/dL — AB (ref 14.1–18.1)
Lymph, poc: 1.9 (ref 0.6–3.4)
MCH, POC: 30.3 pg (ref 27–31.2)
MCHC: 30.3 g/dL — AB (ref 31.8–35.4)
MCV: 99.9 fL — AB (ref 80–97)
MID (cbc): 0.9 (ref 0–0.9)
MPV: 7.7 fL (ref 0–99.8)
POC Granulocyte: 4.7 (ref 2–6.9)
POC LYMPH PERCENT: 25.7 %L (ref 10–50)
POC MID %: 11.7 %M (ref 0–12)
Platelet Count, POC: 216 10*3/uL (ref 142–424)
RBC: 4.52 M/uL — AB (ref 4.69–6.13)
RDW, POC: 14.5 %
WBC: 7.5 10*3/uL (ref 4.6–10.2)

## 2012-06-07 LAB — COMPREHENSIVE METABOLIC PANEL
ALT: 11 U/L (ref 0–53)
AST: 16 U/L (ref 0–37)
Albumin: 4.1 g/dL (ref 3.5–5.2)
Alkaline Phosphatase: 95 U/L (ref 39–117)
BUN: 23 mg/dL (ref 6–23)
CO2: 30 mEq/L (ref 19–32)
Calcium: 9.7 mg/dL (ref 8.4–10.5)
Chloride: 98 mEq/L (ref 96–112)
Creat: 1.06 mg/dL (ref 0.50–1.35)
Glucose, Bld: 108 mg/dL — ABNORMAL HIGH (ref 70–99)
Potassium: 4.6 mEq/L (ref 3.5–5.3)
Sodium: 135 mEq/L (ref 135–145)
Total Bilirubin: 1.1 mg/dL (ref 0.3–1.2)
Total Protein: 6.9 g/dL (ref 6.0–8.3)

## 2012-06-07 MED ORDER — MELOXICAM 7.5 MG PO TABS: 7.5000 mg | ORAL_TABLET | Freq: Every day | ORAL | Status: DC

## 2012-06-07 NOTE — Telephone Encounter (Signed)
Called pt and spoke to his daughter. She stated that she is actually bringing pt in to see Dr L this afternoon and they will speak w/him then.

## 2012-06-07 NOTE — Progress Notes (Addendum)
78 year old gentleman comes in for followup after having fallen over month ago and cracked right rib. His right chest now is much more comfortable but he is having some left-sided chest pain. In addition, his edema has cleared and is breathing normally. He has no other chest pain, nausea, vomiting, no bleeding. No nausea or vomiting  Objective: No acute distress HEENT unremarkable except for ectropion bilaterally Chest: Small rub heard on the right side.  Bibasilar rales Heart: Irregular at times. Blood pressure check standing and sitting and this appears to be stable. Extremities: No edema Abdomen: Soft nontender no [facility] reading (PRIMARY) by  Dr. Milus Glazier  CXR:  Mild CHF  Results for orders placed during the hospital encounter of 04/19/12  CBC WITH DIFFERENTIAL      Component Value Range   WBC 9.9  4.0 - 10.5 K/uL   RBC 4.26  4.22 - 5.81 MIL/uL   Hemoglobin 13.8  13.0 - 17.0 g/dL   HCT 40.9  81.1 - 91.4 %   MCV 96.0  78.0 - 100.0 fL   MCH 32.4  26.0 - 34.0 pg   MCHC 33.7  30.0 - 36.0 g/dL   RDW 78.2  95.6 - 21.3 %   Platelets 183  150 - 400 K/uL   Neutrophils Relative 75  43 - 77 %   Neutro Abs 7.4  1.7 - 7.7 K/uL   Lymphocytes Relative 10 (*) 12 - 46 %   Lymphs Abs 1.0  0.7 - 4.0 K/uL   Monocytes Relative 16 (*) 3 - 12 %   Monocytes Absolute 1.5 (*) 0.1 - 1.0 K/uL   Eosinophils Relative 0  0 - 5 %   Eosinophils Absolute 0.0  0.0 - 0.7 K/uL   Basophils Relative 0  0 - 1 %   Basophils Absolute 0.0  0.0 - 0.1 K/uL  COMPREHENSIVE METABOLIC PANEL      Component Value Range   Sodium 136  135 - 145 mEq/L   Potassium 3.9  3.5 - 5.1 mEq/L   Chloride 95 (*) 96 - 112 mEq/L   CO2 32  19 - 32 mEq/L   Glucose, Bld 133 (*) 70 - 99 mg/dL   BUN 18  6 - 23 mg/dL   Creatinine, Ser 0.86  0.50 - 1.35 mg/dL   Calcium 9.9  8.4 - 57.8 mg/dL   Total Protein 7.2  6.0 - 8.3 g/dL   Albumin 3.6  3.5 - 5.2 g/dL   AST 25  0 - 37 U/L   ALT 22  0 - 53 U/L   Alkaline Phosphatase 126 (*) 39 - 117  U/L   Total Bilirubin 1.5 (*) 0.3 - 1.2 mg/dL   GFR calc non Af Amer 62 (*) >90 mL/min   GFR calc Af Amer 72 (*) >90 mL/min  TROPONIN I      Component Value Range   Troponin I <0.30  <0.30 ng/mL  PRO B NATRIURETIC PEPTIDE      Component Value Range   Pro B Natriuretic peptide (BNP) 2303.0 (*) 0 - 450 pg/mL  PROTIME-INR      Component Value Range   Prothrombin Time 32.2 (*) 11.6 - 15.2 seconds   INR 3.07 (*) 0.00 - 1.49  URINALYSIS, ROUTINE W REFLEX MICROSCOPIC      Component Value Range   Color, Urine YELLOW  YELLOW   APPearance CLEAR  CLEAR   Specific Gravity, Urine 1.011  1.005 - 1.030   pH 7.0  5.0 -  8.0   Glucose, UA NEGATIVE  NEGATIVE mg/dL   Hgb urine dipstick TRACE (*) NEGATIVE   Bilirubin Urine NEGATIVE  NEGATIVE   Ketones, ur NEGATIVE  NEGATIVE mg/dL   Protein, ur NEGATIVE  NEGATIVE mg/dL   Urobilinogen, UA 1.0  0.0 - 1.0 mg/dL   Nitrite NEGATIVE  NEGATIVE   Leukocytes, UA NEGATIVE  NEGATIVE  DIGOXIN LEVEL      Component Value Range   Digoxin Level 0.5 (*) 0.8 - 2.0 ng/mL  URINE MICROSCOPIC-ADD ON      Component Value Range   Squamous Epithelial / LPF RARE  RARE   WBC, UA 0-2  <3 WBC/hpf   RBC / HPF 3-6  <3 RBC/hpf  BASIC METABOLIC PANEL      Component Value Range   Sodium 137  135 - 145 mEq/L   Potassium 3.0 (*) 3.5 - 5.1 mEq/L   Chloride 96  96 - 112 mEq/L   CO2 33 (*) 19 - 32 mEq/L   Glucose, Bld 121 (*) 70 - 99 mg/dL   BUN 17  6 - 23 mg/dL   Creatinine, Ser 1.30  0.50 - 1.35 mg/dL   Calcium 9.5  8.4 - 86.5 mg/dL   GFR calc non Af Amer 73 (*) >90 mL/min   GFR calc Af Amer 84 (*) >90 mL/min  CBC      Component Value Range   WBC 8.6  4.0 - 10.5 K/uL   RBC 3.94 (*) 4.22 - 5.81 MIL/uL   Hemoglobin 12.4 (*) 13.0 - 17.0 g/dL   HCT 78.4 (*) 69.6 - 29.5 %   MCV 96.2  78.0 - 100.0 fL   MCH 31.5  26.0 - 34.0 pg   MCHC 32.7  30.0 - 36.0 g/dL   RDW 28.4  13.2 - 44.0 %   Platelets 172  150 - 400 K/uL  MRSA PCR SCREENING      Component Value Range   MRSA by  PCR NEGATIVE  NEGATIVE  BASIC METABOLIC PANEL      Component Value Range   Sodium 136  135 - 145 mEq/L   Potassium 3.6  3.5 - 5.1 mEq/L   Chloride 94 (*) 96 - 112 mEq/L   CO2 35 (*) 19 - 32 mEq/L   Glucose, Bld 94  70 - 99 mg/dL   BUN 25 (*) 6 - 23 mg/dL   Creatinine, Ser 1.02  0.50 - 1.35 mg/dL   Calcium 9.5  8.4 - 72.5 mg/dL   GFR calc non Af Amer 58 (*) >90 mL/min   GFR calc Af Amer 67 (*) >90 mL/min   Results for orders placed in visit on 06/07/12  POCT CBC      Component Value Range   WBC 7.5  4.6 - 10.2 K/uL   Lymph, poc 1.9  0.6 - 3.4   POC LYMPH PERCENT 25.7  10 - 50 %L   MID (cbc) 0.9  0 - 0.9   POC MID % 11.7  0 - 12 %M   POC Granulocyte 4.7  2 - 6.9   Granulocyte percent 62.6  37 - 80 %G   RBC 4.52 (*) 4.69 - 6.13 M/uL   Hemoglobin 13.7 (*) 14.1 - 18.1 g/dL   HCT, POC 36.6  44.0 - 53.7 %   MCV 99.9 (*) 80 - 97 fL   MCH, POC 30.3  27 - 31.2 pg   MCHC 30.3 (*) 31.8 - 35.4 g/dL   RDW, POC 14.5  Platelet Count, POC 216  142 - 424 K/uL   MPV 7.7  0 - 99.8 fL     Assessment:  Mild CHF.  Small rub on the left.  Plan:   Mobic 7.5 mg qd Follow up 1 month. No other changes.   Patient also having neck and back pain on the left. Requesting referral to Dr. Danielle Dess

## 2012-06-07 NOTE — Addendum Note (Signed)
Addended by: Elvina Sidle on: 06/07/2012 04:22 PM   Modules accepted: Orders

## 2012-06-07 NOTE — Telephone Encounter (Signed)
Please call patient to see how he and wife are doing

## 2012-06-07 NOTE — Telephone Encounter (Signed)
Rhonda from radiology called with CXR report. RL lobe airspace disease consistent with edema or early infiltrate. Chronic LL Lobe atelectasis and scarring.

## 2012-06-08 ENCOUNTER — Encounter: Payer: Self-pay | Admitting: Cardiology

## 2012-06-08 ENCOUNTER — Other Ambulatory Visit (INDEPENDENT_AMBULATORY_CARE_PROVIDER_SITE_OTHER): Payer: Medicare Other

## 2012-06-08 ENCOUNTER — Ambulatory Visit (INDEPENDENT_AMBULATORY_CARE_PROVIDER_SITE_OTHER): Payer: Medicare Other | Admitting: Cardiology

## 2012-06-08 VITALS — BP 110/72 | HR 57 | Resp 18 | Ht 75.0 in | Wt 204.1 lb

## 2012-06-08 DIAGNOSIS — I4891 Unspecified atrial fibrillation: Secondary | ICD-10-CM

## 2012-06-08 DIAGNOSIS — I5033 Acute on chronic diastolic (congestive) heart failure: Secondary | ICD-10-CM

## 2012-06-08 DIAGNOSIS — I951 Orthostatic hypotension: Secondary | ICD-10-CM

## 2012-06-08 DIAGNOSIS — R0989 Other specified symptoms and signs involving the circulatory and respiratory systems: Secondary | ICD-10-CM

## 2012-06-08 DIAGNOSIS — I509 Heart failure, unspecified: Secondary | ICD-10-CM

## 2012-06-08 NOTE — Assessment & Plan Note (Signed)
He has a history of diastolic congestive heart failure and he also has autonomic dysfunction with orthostatic hypotension.  If he does not take Enough Lasix he goes into heart failure and if he takes too much he becomes dizzy.  Presently he is not having any symptoms of congestive heart failure but is orthostatic symptoms are worse and we will have him continue on his lower dose of Lasix 20 mg daily instead of the previous higher dose of 40 mg daily

## 2012-06-08 NOTE — Patient Instructions (Addendum)
Your physician recommends that you schedule a follow-up appointment in: 2 months OV/EKG  ONLY TAKE LASIX 20 MG DAILY

## 2012-06-08 NOTE — Assessment & Plan Note (Signed)
Patient has not been aware of any recent atrial fibrillation.  He has not had any TIA symptoms off Coumadin

## 2012-06-08 NOTE — Progress Notes (Signed)
Marco Gregory Date of Birth:  [birth year] [facility] [address] [place], [place]  [address] [phone]         Fax   [phone]  History of Present Illness: This pleasant 78 year old gentleman is seen for a scheduled followup office visit. He has a past history of autonomic dysfunction with orthostatic hypotension. He was recently hospitalized with congestive heart failure responsive to Lasix. He was also felt to have an exacerbation of his COPD and was sent home on a tapering dose of steroids and on Levaquin which he has now finished. In the hospital he states that he diuresed 14 pounds in 2 days and his orthopnea and dyspnea improved significantly. He has also been having a sensation of excessive phlegm in his throat which is still present and for which she will be seeing an ENT specialist Dr. Jenne Pane. The patient has not been having any chest pain or angina. He is still having some orthopnea and sleeps better in a chair then in the bed. Complains of weakness and still is having problems with sensation of orthostatic dizziness. Since last visit he had a bad fall and broke 2 ribs.  He was subsequently admitted to [facility] with exacerbation of congestive heart failure again and after that had to be transferred to [facility] for rehabilitation.  He left [facility] on his own accord on October 1 and returned to his own home.  [facility] coming out and giving him physical therapy.  In any health care is getting him a set of automatic scales to weigh himself each day at home.  He is no longer on Coumadin because he is a fall risk.  Current Outpatient Prescriptions  Medication Sig Dispense Refill  . acetaminophen (TYLENOL) 500 MG tablet Take 500-1,000 mg by mouth every 6 (six) hours as needed. For pain      . albuterol (PROVENTIL HFA;VENTOLIN HFA) 108 (90 BASE) MCG/ACT inhaler Inhale 1 puff into the lungs 2 (two) times daily. X 3 days, then  continue as needed for shortness of breath or wheezing  1 Inhaler  3  . digoxin (LANOXIN) 0.125 MG tablet Take 125 mcg by mouth daily.      . dorzolamide-timolol (COSOPT) 22.3-6.8 MG/ML ophthalmic solution Place 1 drop into both eyes daily.       . finasteride (PROSCAR) 5 MG tablet Take 5 mg by mouth daily.       . fludrocortisone (FLORINEF) 0.1 MG tablet Take 0.1 mg by mouth daily.      . furosemide (LASIX) 20 MG tablet Take 20 mg by mouth daily.      [name] levothyroxine (SYNTHROID, LEVOTHROID) 150 MCG tablet Take 1 tablet (150 mcg total) by mouth daily before breakfast.  30 tablet  3  . meloxicam (MOBIC) 7.5 MG tablet Take 1 tablet (7.5 mg total) by mouth daily.  30 tablet  0  . midodrine (PROAMATINE) 5 MG tablet Take 1 tablet (5 mg total) by mouth 3 (three) times daily. When blood pressure is low      . Multiple Vitamin (MULTIVITAMIN WITH MINERALS) TABS Take 1 tablet by mouth daily.      . potassium chloride (K-DUR) 10 MEQ tablet Take 1 tablet (10 mEq total) by mouth 2 (two) times daily.      [name] pyridostigmine (MESTINON) 60 MG tablet Take 60 mg by mouth 3 (three) times daily. Breakfast lunch and dinner      . warfarin (COUMADIN) 5 MG  tablet TAKE 1 TABLET BY MOUTH EVERY DAY AS DIRECTED  90 tablet  1    No Known Allergies  Patient Active Problem List  Diagnosis  . OBSTRUCTIVE SLEEP APNEA  . Atrial fibrillation  . DYSAUTONOMIA  . PACEMAKER, PERMANENT  . Overactive bladder  . COPD exacerbation  . Acute on chronic diastolic CHF (congestive heart failure), NYHA class 3  . Orthostasis  . Myasthenia gravis  . Pancreatic mass    History  Smoking status  . Former Smoker -- 1.0 packs/day for 25 years  . Types: Cigarettes  . Quit date: 08/09/1965  Smokeless tobacco  . Never Used    History  Alcohol Use No    "last alcohol ~ 2008"    Family History  Problem Relation Age of Onset  . Cancer Mother 9    GYN    Review of Systems: Constitutional: no fever chills diaphoresis or  fatigue or change in weight.  Head and neck: no hearing loss, no epistaxis, no photophobia or visual disturbance. Respiratory: No cough, shortness of breath or wheezing. Cardiovascular: No chest pain peripheral edema, palpitations. Gastrointestinal: No abdominal distention, no abdominal pain, no change in bowel habits hematochezia or melena. Genitourinary: No dysuria, no frequency, no urgency, no nocturia. Musculoskeletal:No arthralgias, no back pain, no gait disturbance or myalgias. Neurological: No dizziness, no headaches, no numbness, no seizures, no syncope, no weakness, no tremors. Hematologic: No lymphadenopathy, no easy bruising. Psychiatric: No confusion, no hallucinations, no sleep disturbance.    Physical Exam: Filed Vitals:   06/08/12 1145  BP: 110/72  Pulse: 57  Resp: 18   general appearance reveals a well-developed well-nourished gentleman who is traveling in a wheelchair today.The head and neck exam reveals pupils equal and reactive.  Extraocular movements are full.  There is no scleral icterus.  The mouth and pharynx are normal.  The neck is supple.  The carotids reveal no bruits.  The jugular venous pressure is normal.  The  thyroid is not enlarged.  There is no lymphadenopathy.  The chest is clear to percussion and auscultation.  There are no rales or rhonchi.  Expansion of the chest is symmetrical.  The precordium is quiet.  The first heart sound is normal.  The second heart sound is physiologically split.  There is no murmur gallop rub or click.  There is no abnormal lift or heave.  The abdomen is soft and nontender.  The bowel sounds are normal.  The liver and spleen are not enlarged.  There are no abdominal masses.  There are no abdominal bruits.  Extremities reveal good pedal pulses.  There is no phlebitis or edema.  There is no cyanosis or clubbing.  Strength is normal and symmetrical in all extremities.  There is no lateralizing weakness.  There are no sensory deficits.   The skin is warm and dry.  There is no rash.     Assessment / Plan: Continue same medication.  Take just 20 mg Lasix daily instead of 40.  Stay off Coumadin permanently.  Recheck in 2 months for office visit and EKG.  Continue with home health physical therapy.

## 2012-06-08 NOTE — Assessment & Plan Note (Signed)
His orthostasis is worse.  His primary care provider has prescribed Mobic for his arthritis and his pleuritic chest pain which is a residual of his fractured ribs and the Mobic may have to raise his blood pressure slightly and help to stabilize his symptoms of orthostatic hypotension

## 2012-06-09 ENCOUNTER — Encounter: Payer: Self-pay | Admitting: *Deleted

## 2012-06-14 ENCOUNTER — Ambulatory Visit: Payer: Self-pay | Admitting: Cardiology

## 2012-06-14 ENCOUNTER — Telehealth: Payer: Self-pay

## 2012-06-14 NOTE — Telephone Encounter (Signed)
[facility]'s nurse called today to let you know that Marco Gregory has signed up for their "heart failure program". He will be weighing himself daily and providing health information to their nurse. According to the nurse, their is nothing you need to do but they wanted [place] to notify you of this.  Marco Gregory

## 2012-06-19 ENCOUNTER — Ambulatory Visit: Payer: Medicare Other | Admitting: Cardiology

## 2012-06-21 ENCOUNTER — Encounter: Payer: Self-pay | Admitting: *Deleted

## 2012-06-30 ENCOUNTER — Encounter: Payer: Self-pay | Admitting: Internal Medicine

## 2012-06-30 ENCOUNTER — Ambulatory Visit (INDEPENDENT_AMBULATORY_CARE_PROVIDER_SITE_OTHER): Payer: Medicare Other | Admitting: Internal Medicine

## 2012-06-30 VITALS — BP 108/66 | HR 57 | Ht 72.0 in | Wt 209.0 lb

## 2012-06-30 DIAGNOSIS — I495 Sick sinus syndrome: Secondary | ICD-10-CM

## 2012-06-30 DIAGNOSIS — I951 Orthostatic hypotension: Secondary | ICD-10-CM

## 2012-06-30 DIAGNOSIS — I5032 Chronic diastolic (congestive) heart failure: Secondary | ICD-10-CM

## 2012-06-30 DIAGNOSIS — I4891 Unspecified atrial fibrillation: Secondary | ICD-10-CM

## 2012-06-30 DIAGNOSIS — Z7901 Long term (current) use of anticoagulants: Secondary | ICD-10-CM

## 2012-06-30 DIAGNOSIS — Z950 Presence of cardiac pacemaker: Secondary | ICD-10-CM

## 2012-06-30 LAB — PACEMAKER DEVICE OBSERVATION
AL AMPLITUDE: 0.7 mv
AL IMPEDENCE PM: 400 Ohm
ATRIAL PACING PM: 0
BAMS-0001: 150 {beats}/min
BATTERY VOLTAGE: 2.93 V
DEVICE MODEL PM: 7268137
RV LEAD IMPEDENCE PM: 540 Ohm
VENTRICULAR PACING PM: 70

## 2012-06-30 NOTE — Progress Notes (Signed)
Patient Care Team: Elvina Sidle, MD as PCP - General (Family Medicine) Duke Salvia, MD (Cardiology) Evie Lacks, MD (Neurology) Barnett Abu, MD (Neurosurgery) Antony Haste, MD as Attending Physician (Urology)   HPI  Marco Gregory is a 78 y.o. 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 He had a recent fall but he thinks is related to grabbing and missing a chair. His anticoagulant was discontinued.  His major issue now is he relates a story of his wife with severe epistaxis. Dr. TB tells me that there was a greater than 11 unit of blood loss. With multiple surgeries with final relief be accomplished at [facility].   Past Medical History  Diagnosis Date  . Long-term (current) use of anticoagulants   . Glaucoma(365)   . Gout   . Hyperthyroidism   . Orthostatic hypotension   . Redundant prepuce and phimosis   . Acute cystitis   . Hypertonicity of bladder   . HTN (hypertension)   . Atelectasis   . Dysautonomia   . Pacemaker     St. Jude; device generator replacement 2012  . CHF (congestive heart failure)     Previous preserved EF.  [name] Atrial fibrillation   . Bradyarrhythmia     bradycardia  . Pneumonia 08/2011    after hernia OR  . Hypothyroidism   . Sleep apnea     does not use cpap ; last used ~ 2011 (03/10/12)  . Spinal stenosis of lumbar region   . Chronic kidney disease [facility]     UTI  1 MO AGO TX MEDICALLY   . Osteomyelitis of toe of right foot ~ 2009    amputated 2nd toe    Past Surgical History  Procedure Date  . Cardioversion 12/19/2003    cardioversion and pacemaker interrogation  . Elbow bursa surgery 04/07/2006    Right elbow septic olecranon bursitis  . Toe amputation ~ 2009    Osteomyelitis, right foot second toe /   Right foot second ray amputation  . Cardiac catheterization 07/03/2002    EF 60-70% /  Normal left ventricular function. / Very mild trivial three-vessel coronary atherosclerosis. / There is no mitral regurgitation noted  . Tonsillectomy and adenoidectomy     "when I was a youngster"  . Appendectomy     "when I was a youngster"  . Inguinal hernia repair ~ 2003    right  . Inguinal hernia repair 08/26/2011    Procedure: HERNIA REPAIR INGUINAL ADULT;  Surgeon: Kandis Cocking, MD;  Location: [facility] OR;  Service: General;  Laterality: Left;  open left inguinal hernia  . Insert / replace / remove pacemaker 07/04/2002 dr Graciela Husbands     dual chamber pacemaker - St. Jude model 1688 TC 58 cm active fixation lead was placed into the right ventricle and the St Jude model 1688 TC 52 cm active fixation pacing lead was placed in the right atrium.  The ventricular lead serial number was [id number] and the atrial lead serial number was [id number]/  Of note, the pacemaker generator was an identity [id number] serial number [id number]   . Insert / replace / remove pacemaker ~ 06/2011    St Jude  . Cataract extraction w/ intraocular lens  implant, bilateral 01/2005  . Eye surgery     Current Outpatient Prescriptions  Medication Sig Dispense Refill  . acetaminophen (TYLENOL) 500 MG tablet Take 500-1,000 mg by mouth every 6 (six) hours as needed. For pain      . albuterol (PROVENTIL HFA;VENTOLIN HFA) 108 (90 BASE) MCG/ACT inhaler Inhale 1 puff into the lungs 2 (two) times daily. X 3 days, then continue as needed for shortness of breath or wheezing  1 Inhaler  3  . digoxin (LANOXIN) 0.125 MG tablet Take 125 mcg by mouth daily.      . dorzolamide-timolol (COSOPT) 22.3-6.8 MG/ML ophthalmic solution Place 1 drop into both eyes daily.       . finasteride (PROSCAR) 5 MG tablet Take 5 mg by mouth daily.       . fludrocortisone (FLORINEF) 0.1 MG tablet Take 0.1 mg by mouth daily.      . furosemide (LASIX) 20 MG tablet Take 20 mg by mouth  daily.      [name] levothyroxine (SYNTHROID, LEVOTHROID) 150 MCG tablet Take 1 tablet (150 mcg total) by mouth daily before breakfast.  30 tablet  3  . meloxicam (MOBIC) 7.5 MG tablet Take 1 tablet (7.5 mg total) by mouth daily.  30 tablet  0  . midodrine (PROAMATINE) 5 MG tablet Take 1 tablet (5 mg total) by mouth 3 (three) times daily. When blood pressure is low      . Multiple Vitamin (MULTIVITAMIN WITH MINERALS) TABS Take 1 tablet by mouth daily.      . potassium chloride (K-DUR) 10 MEQ tablet Take 1 tablet (10 mEq total) by mouth 2 (two) times daily.      [name] pyridostigmine (MESTINON) 60 MG tablet Take 60 mg by mouth 3 (three) times daily. Breakfast lunch and dinner        No Known Allergies  Review of Systems negative except from HPI and PMH  Physical Exam BP 108/66  Pulse 57  Ht 6' (1.829 m)  Wt 209 lb (94.802 kg)  BMI 28.35 kg/m2 Well developed and well nourished in no acute distress HENT normal E scleral and icterus clear Neck Supple JVP flat; carotids brisk and full Clear to ausculation  Regular rate and rhythm, no murmurs gallops or rub Soft with active bowel sounds No clubbing cyanosis Trace Edema Alert and oriented, grossly normal motor and sensory function Skin Warm and Dry    Assessment and  Plan

## 2012-07-01 ENCOUNTER — Encounter: Payer: Self-pay | Admitting: Internal Medicine

## 2012-07-01 DIAGNOSIS — Z7901 Long term (current) use of anticoagulants: Secondary | ICD-10-CM | POA: Insufficient documentation

## 2012-07-01 NOTE — Assessment & Plan Note (Addendum)
Well compensated currently. We certainly can increase ProAmatine if nered be

## 2012-07-01 NOTE — Assessment & Plan Note (Addendum)
The patient has ongoing issues with atrial fibrillation with thromboembolic risk factors notable for age.  I would favor the use of ongoing anticoagulation as his risks of falls I do not think mitigate the benefits particularly of the NOACs. I discussed this with Dr. TB who is in agreement. We will review with his PCP.

## 2012-07-01 NOTE — Assessment & Plan Note (Signed)
Currently stable class II symptoms

## 2012-07-01 NOTE — Assessment & Plan Note (Signed)
The patient's device was interrogated.  The information was reviewed. No changes were made in the programming.    

## 2012-07-11 ENCOUNTER — Telehealth: Payer: Self-pay

## 2012-07-11 ENCOUNTER — Other Ambulatory Visit: Payer: Self-pay | Admitting: Family Medicine

## 2012-07-11 DIAGNOSIS — R05 Cough: Secondary | ICD-10-CM

## 2012-07-11 DIAGNOSIS — R059 Cough, unspecified: Secondary | ICD-10-CM

## 2012-07-11 MED ORDER — HYDROCODONE-HOMATROPINE 5-1.5 MG/5ML PO SYRP: 5.0000 mL | ORAL_SOLUTION | Freq: Two times a day (BID) | ORAL | Status: DC | PRN

## 2012-07-11 MED ORDER — AZITHROMYCIN 250 MG PO TABS: ORAL_TABLET | ORAL | Status: DC

## 2012-07-11 NOTE — Telephone Encounter (Signed)
Patient would like Dr. Milus Glazier to call him back. He did not want to leave a specific message, he would just like Dr. Elbert Ewings to call him.  Best [phone]

## 2012-07-11 NOTE — Telephone Encounter (Signed)
Pt reports that he has a bad cold w/some nasal cong and a lot of heavy dark brown phlegm that is making him cough. I advised pt that he should come in to see Dr L. Pt states that he is not driving anymore and doesn't have anyone that can bring him in to see Dr Milus Glazier, but wonders if Dr L could call him in some medicine to help him get rid of the cold and some cough medicine. He uses the [facility] on [address].

## 2012-07-11 NOTE — Addendum Note (Signed)
Addended by: Sheppard Plumber A on: 07/11/2012 04:54 PM   Modules accepted: Orders

## 2012-07-11 NOTE — Telephone Encounter (Signed)
Dr Milus Glazier sent in Rxs for Z pack and cough med. Notified pt that was done and advised him to come in if he doesn't improve. Pt agreed.

## 2012-07-28 ENCOUNTER — Telehealth: Payer: Self-pay | Admitting: Cardiology

## 2012-07-28 NOTE — Telephone Encounter (Signed)
Will route to Melinda/ Dr Yevonne Pax nurse.

## 2012-07-28 NOTE — Telephone Encounter (Signed)
New problem:    Wants to discuss the matter with Juliette Alcide only . Will wait for her call on  Monday

## 2012-07-31 NOTE — Telephone Encounter (Signed)
Patient called about wife, will open an encounter for her

## 2012-08-07 ENCOUNTER — Ambulatory Visit (INDEPENDENT_AMBULATORY_CARE_PROVIDER_SITE_OTHER): Payer: Medicare Other | Admitting: Cardiology

## 2012-08-07 ENCOUNTER — Encounter: Payer: Self-pay | Admitting: Cardiology

## 2012-08-07 VITALS — BP 122/61 | HR 86 | Resp 18 | Ht 76.0 in | Wt 214.0 lb

## 2012-08-07 DIAGNOSIS — I5032 Chronic diastolic (congestive) heart failure: Secondary | ICD-10-CM

## 2012-08-07 DIAGNOSIS — I480 Paroxysmal atrial fibrillation: Secondary | ICD-10-CM | POA: Insufficient documentation

## 2012-08-07 DIAGNOSIS — I951 Orthostatic hypotension: Secondary | ICD-10-CM

## 2012-08-07 DIAGNOSIS — I4891 Unspecified atrial fibrillation: Secondary | ICD-10-CM

## 2012-08-07 NOTE — Assessment & Plan Note (Signed)
His symptoms of orthostasis had improved on current therapy.

## 2012-08-07 NOTE — Assessment & Plan Note (Signed)
Recently he has noted more peripheral and he will step up his Lasix to 40 or 60 mg daily for the next several days until the edema resolves.

## 2012-08-07 NOTE — Progress Notes (Signed)
Marco Gregory Date of Birth:  October 04, 1920 [facility] [address] [place], [place]  [address] [phone]         Fax   [phone]  History of Present Illness:  This pleasant 78 year old gentleman is seen for a scheduled followup office visit. He has a past history of autonomic dysfunction with orthostatic hypotension. He was recently hospitalized with congestive heart failure responsive to Lasix. He was also felt to have an exacerbation of his COPD and was sent home on a tapering dose of steroids and on Levaquin which he has now finished. In the hospital he states that he diuresed 14 pounds in 2 days and his orthopnea and dyspnea improved significantly. He has also been having a sensation of excessive phlegm in his throat which is still present and for which she will be seeing an ENT specialist Dr. Jenne Pane. The patient has not been having any chest pain or angina. He is still having some orthopnea and sleeps better in a chair then in the bed. Complains of weakness and still is having problems with sensation of orthostatic dizziness.  Since last visit he had a bad fall and broke 2 ribs. He was subsequently admitted to [facility] with exacerbation of congestive heart failure again and after that had to be transferred to [facility] for rehabilitation. He left [facility] on his own accord on October 1 and returned to his own home. [facility] coming out and giving him physical therapy. [facility] is getting him a set of automatic scales to weigh himself each day at home. He is no longer on Coumadin because he is a fall risk.  Since last visit he was strength and ambulation have improved.  He is staying busy going out to [facility] to visit his wife who is still the patient there  Current Outpatient Prescriptions  Medication Sig Dispense Refill  . acetaminophen (TYLENOL) 500 MG tablet Take 500-1,000 mg by mouth every 6 (six) hours as needed.  For pain      . albuterol (PROVENTIL HFA;VENTOLIN HFA) 108 (90 BASE) MCG/ACT inhaler Inhale 1 puff into the lungs 2 (two) times daily. X 3 days, then continue as needed for shortness of breath or wheezing  1 Inhaler  3  . azithromycin (ZITHROMAX Z-PAK) 250 MG tablet Take as directed on pack  6 tablet  0  . digoxin (LANOXIN) 0.125 MG tablet Take 125 mcg by mouth daily.      . dorzolamide-timolol (COSOPT) 22.3-6.8 MG/ML ophthalmic solution Place 1 drop into both eyes daily.       . finasteride (PROSCAR) 5 MG tablet Take 5 mg by mouth daily.       . fludrocortisone (FLORINEF) 0.1 MG tablet Take 0.1 mg by mouth daily.      . furosemide (LASIX) 20 MG tablet Take 20 mg by mouth daily.      [name] HYDROcodone-homatropine (HYCODAN) 5-1.5 MG/5ML syrup Take 5 mLs by mouth 2 (two) times daily as needed for cough.  120 mL  0  . levothyroxine (SYNTHROID, LEVOTHROID) 150 MCG tablet Take 1 tablet (150 mcg total) by mouth daily before breakfast.  30 tablet  3  . meloxicam (MOBIC) 7.5 MG tablet Take 1 tablet (7.5 mg total) by mouth daily.  30 tablet  0  . midodrine (PROAMATINE) 5 MG tablet Take 1 tablet (5 mg total) by mouth 3 (three) times daily. When blood pressure is low      .  Multiple Vitamin (MULTIVITAMIN WITH MINERALS) TABS Take 1 tablet by mouth daily.      . potassium chloride (K-DUR) 10 MEQ tablet Take 1 tablet (10 mEq total) by mouth 2 (two) times daily.      [name] pyridostigmine (MESTINON) 60 MG tablet Take 60 mg by mouth 3 (three) times daily. Breakfast lunch and dinner        No Known Allergies  Patient Active Problem List  Diagnosis  . OBSTRUCTIVE SLEEP APNEA  . Atrial fibrillation  . DYSAUTONOMIA  . PPM-St.Jude  . Overactive bladder  . COPD exacerbation  . Chronic diastolic heart failure  . Orthostasis  . Myasthenia gravis  . Pancreatic mass  . Long-term (current) use of anticoagulants  . PAF (paroxysmal atrial fibrillation)    History  Smoking status  . Former Smoker -- 1.0 packs/day for  25 years  . Types: Cigarettes  . Quit date: 08/09/1965  Smokeless tobacco  . Never Used    History  Alcohol Use No    Comment: "last alcohol ~ 2008"    Family History  Problem Relation Age of Onset  . Cancer Mother 35    GYN    Review of Systems: Constitutional: no fever chills diaphoresis or fatigue or change in weight.  Head and neck: no hearing loss, no epistaxis, no photophobia or visual disturbance. Respiratory: No cough, shortness of breath or wheezing. Cardiovascular: No chest pain peripheral edema, palpitations. Gastrointestinal: No abdominal distention, no abdominal pain, no change in bowel habits hematochezia or melena. Genitourinary: No dysuria, no frequency, no urgency, no nocturia. Musculoskeletal:No arthralgias, no back pain, no gait disturbance or myalgias. Neurological: No dizziness, no headaches, no numbness, no seizures, no syncope, no weakness, no tremors. Hematologic: No lymphadenopathy, no easy bruising. Psychiatric: No confusion, no hallucinations, no sleep disturbance.    Physical Exam: Filed Vitals:   08/07/12 1500  BP: 122/61  Pulse: 86  Resp: 18   the general appearance reveals a large elderly gentleman in no distress.  He has poor balance and is a fall risk and walks with a walker.The head and neck exam reveals pupils equal and reactive.  Extraocular movements are full.  There is no scleral icterus.  The mouth and pharynx are normal.  The neck is supple.  The carotids reveal no bruits.  The jugular venous pressure is normal.  The  thyroid is not enlarged.  There is no lymphadenopathy.  The chest is clear to percussion and auscultation.  There are no rales or rhonchi.  Expansion of the chest is symmetrical.  The precordium is quiet.  The first heart sound is normal.  The second heart sound is physiologically split.  There is no murmur gallop rub or click.  There is no abnormal lift or heave.  The abdomen is soft and nontender.  The bowel sounds are  normal.  The liver and spleen are not enlarged.  There are no abdominal masses.  There are no abdominal bruits.  Extremities reveal good pedal pulses.  There is moderate pretibial edema bilaterally There is no cyanosis or clubbing.  Strength is normal and symmetrical in all extremities.  There is no lateralizing weakness.  There are no sensory deficits.  The skin is warm and dry.  There is no rash.    Assessment / Plan: Continue same medication.  He will increase his Lasix over the next several days as necessary to resolve edema.  He is trying to get strong enough to be able to bring his wife  back home with help of a home health aide. Recheck in 2 months for followup office visit CBC and basal metabolic panel.

## 2012-08-07 NOTE — Assessment & Plan Note (Signed)
The patient has not been aware of any recent atrial fibrillation.  Today on exam his pulse is regular.  He does have an underlying pacemaker

## 2012-08-07 NOTE — Patient Instructions (Signed)
Your physician recommends that you continue on your current medications as directed. Please refer to the Current Medication list given to you today.  Your physician recommends that you schedule a follow-up appointment in: 2 month ov/bmet/cbc 

## 2012-08-17 ENCOUNTER — Telehealth: Payer: Self-pay | Admitting: Internal Medicine

## 2012-08-17 ENCOUNTER — Telehealth: Payer: Self-pay | Admitting: Cardiology

## 2012-08-17 DIAGNOSIS — I4891 Unspecified atrial fibrillation: Secondary | ICD-10-CM

## 2012-08-17 MED ORDER — LEVOTHYROXINE SODIUM 150 MCG PO TABS: 150.0000 ug | ORAL_TABLET | Freq: Every day | ORAL | Status: DC

## 2012-08-17 NOTE — Telephone Encounter (Signed)
Pt needs refill of synthroid [facility]

## 2012-08-17 NOTE — Telephone Encounter (Signed)
Spoke with pt, he was to start on a different anticoag drug and it has not been sent into the pharm yet. Will need to ask dr Graciela Husbands what strength of apixaban the pt needs. Note is in dr [employer] under cc'ed charts. Will forward for heather to ask dr Graciela Husbands tomorrow. Pt agreed with this plan. [facility].

## 2012-08-17 NOTE — Telephone Encounter (Signed)
New Problem:    Patient called in about receiving a replacement drug for his comadin that Dr. Graciela Husbands had gotten approved.  Please call back.

## 2012-08-18 MED ORDER — APIXABAN 5 MG PO TABS: 5.0000 mg | ORAL_TABLET | Freq: Two times a day (BID) | ORAL | Status: DC

## 2012-08-18 NOTE — Telephone Encounter (Signed)
The patient is aware that Eliquis 5 mg BID has been sent to his pharmacy.

## 2012-08-18 NOTE — Telephone Encounter (Signed)
Dr. Graciela Husbands, please advise.

## 2012-08-18 NOTE — Telephone Encounter (Signed)
apixoban 5 mg bid thx

## 2012-08-19 ENCOUNTER — Telehealth: Payer: Self-pay

## 2012-08-19 NOTE — Telephone Encounter (Signed)
Pt is needing to talk with someone about his medication he would not go into further details  Best number [phone]

## 2012-08-21 NOTE — Telephone Encounter (Signed)
He states he does not recall leaving this message, and has no needs currently/ Sydnei Ohaver

## 2012-08-28 ENCOUNTER — Other Ambulatory Visit: Payer: Self-pay

## 2012-08-28 NOTE — Telephone Encounter (Signed)
[facility] to notify pt Marco Gregory 5 mg tablet was approval by Medicare for the dates:08/18/2012 to 08/08/13. Pt has a to pay $273.34 per Pharmist Larita Fife.The deductible has to be met.

## 2012-09-18 IMAGING — CR DG CHEST 1V PORT
1 series · 1 of 1 positions shown · non-contrast
Comparison: PA and lateral chest 04/14/2011.

CLINICAL DATA: Shortness of breath and wheezing.

PORTABLE CHEST - 1 VIEW

[AP]
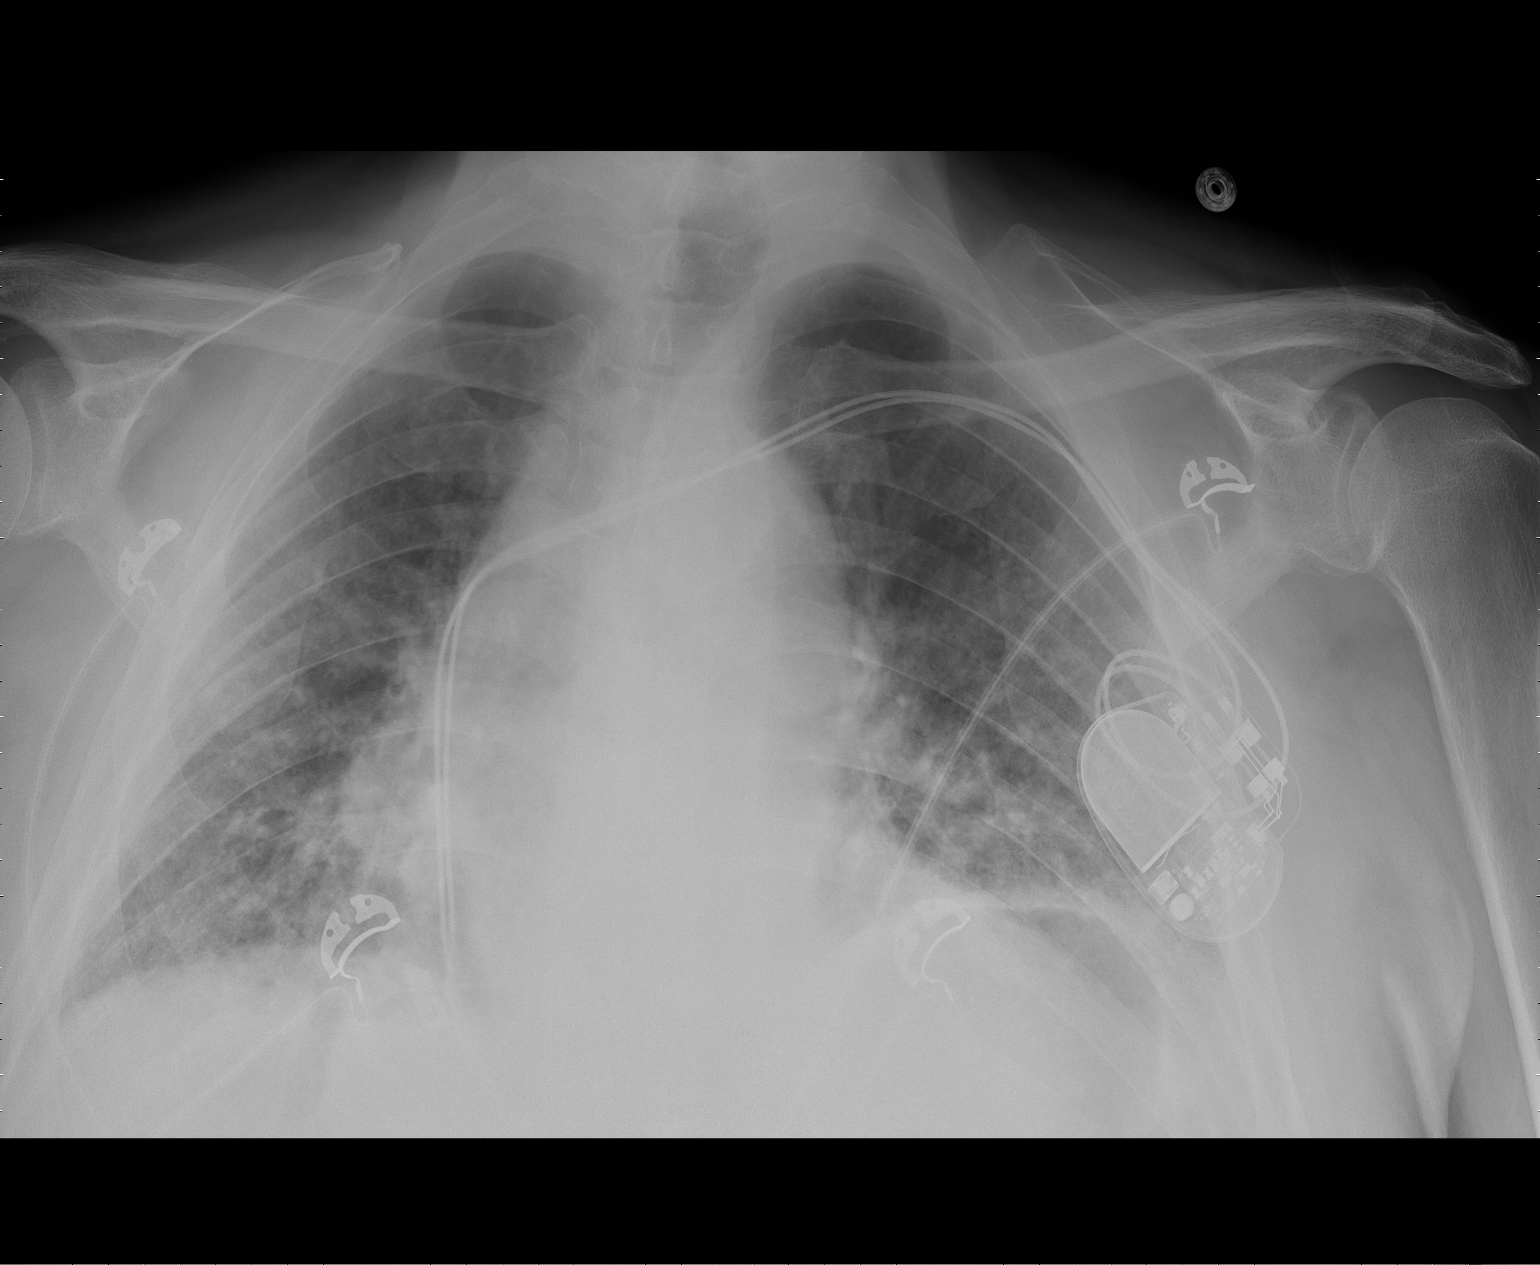

[1 of 1 positions shown; findings below may reference images not displayed]

FINDINGS: There is cardiomegaly.  Bilateral interstitial opacities
most prominent bases are likely due to pulmonary edema.  No
pneumothorax is identified.
IMPRESSION: Cardiomegaly and interstitial opacities most consistent with
pulmonary edema.

## 2012-09-21 IMAGING — CR DG CHEST 1V PORT
1 series · 1 of 1 positions shown · non-contrast
Comparison: Portable exam 2072 hours compared to 08/30/2011

CLINICAL DATA: Shortness of breath

PORTABLE CHEST - 1 VIEW

[view not recorded]
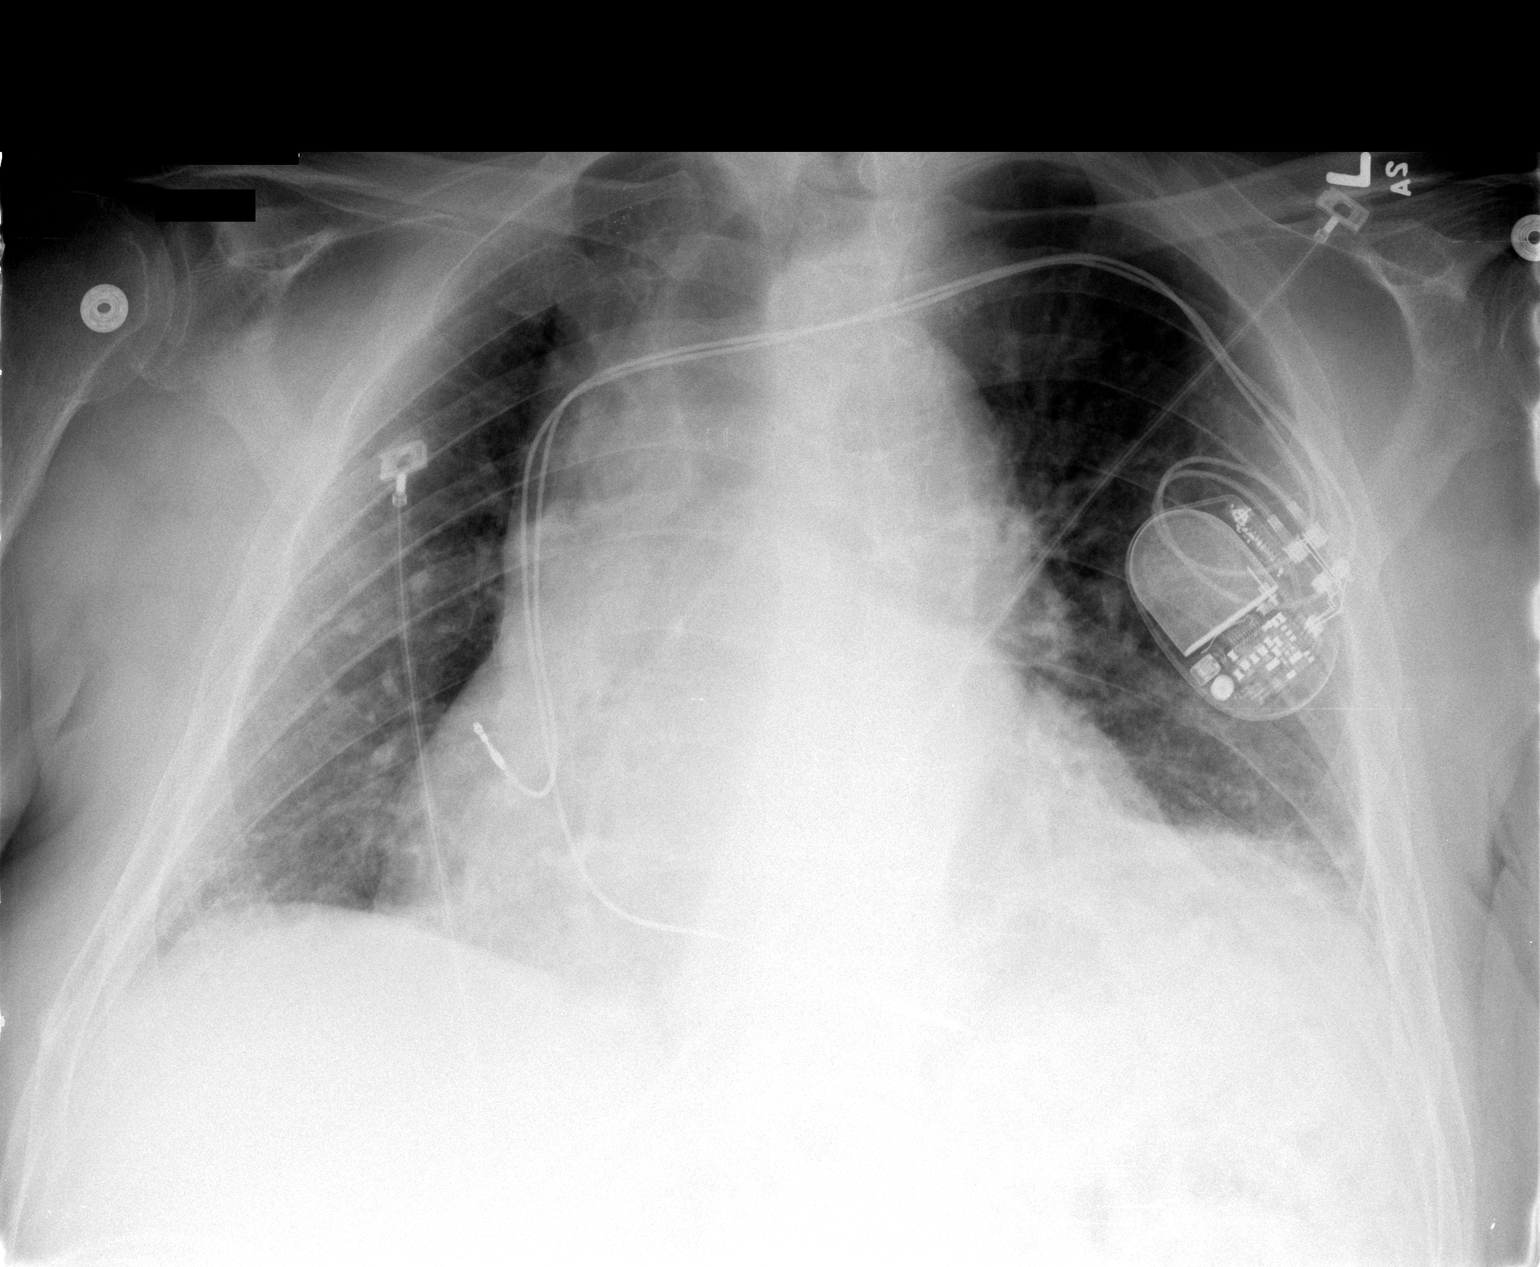

[1 of 1 positions shown; findings below may reference images not displayed]

FINDINGS: Left subclavian sequential transvenous pacemaker leads project at
right atrium and right ventricle.
Enlargement of cardiac silhouette with pulmonary vascular
congestion.
Rotated to the right.
Tortuous thoracic aorta with atherosclerotic calcification at arch.
Bibasilar atelectasis, infiltrate left lower lobe not excluded.
No pneumothorax.
Bones demineralized.
IMPRESSION: Enlargement of cardiac silhouette with pulmonary vascular
congestion post pacemaker.
Bibasilar atelectasis, cannot exclude infiltrate left lower lobe.
Compared to previous exam, left basilar opacity is slightly
increased.

## 2012-10-06 ENCOUNTER — Other Ambulatory Visit (INDEPENDENT_AMBULATORY_CARE_PROVIDER_SITE_OTHER): Payer: Medicare Other

## 2012-10-06 ENCOUNTER — Encounter: Payer: Self-pay | Admitting: Cardiology

## 2012-10-06 ENCOUNTER — Ambulatory Visit (INDEPENDENT_AMBULATORY_CARE_PROVIDER_SITE_OTHER): Payer: Medicare Other | Admitting: Cardiology

## 2012-10-06 VITALS — BP 108/68 | HR 60 | Ht 76.0 in | Wt 214.0 lb

## 2012-10-06 LAB — CBC WITH DIFFERENTIAL/PLATELET
Basophils Relative: 0.5 % (ref 0.0–3.0)
Eosinophils Relative: 1.8 % (ref 0.0–5.0)
Lymphocytes Relative: 28.1 % (ref 12.0–46.0)
MCV: 94.2 fl (ref 78.0–100.0)
Monocytes Absolute: 0.7 10*3/uL (ref 0.1–1.0)
Monocytes Relative: 10.9 % (ref 3.0–12.0)
Neutrophils Relative %: 58.7 % (ref 43.0–77.0)
Platelets: 143 10*3/uL — ABNORMAL LOW (ref 150.0–400.0)
RBC: 4.15 Mil/uL — ABNORMAL LOW (ref 4.22–5.81)
WBC: 6.1 10*3/uL (ref 4.5–10.5)

## 2012-10-06 LAB — BASIC METABOLIC PANEL
Chloride: 101 mEq/L (ref 96–112)
GFR: 80.26 mL/min (ref >60.00)
Glucose, Bld: 116 mg/dL — ABNORMAL HIGH (ref 70–99)
Potassium: 4.2 mEq/L (ref 3.5–5.1)
Sodium: 139 mEq/L (ref 135–145)

## 2012-10-06 MED ORDER — HYDROCODONE-HOMATROPINE 5-1.5 MG/5ML PO SYRP: 5.0000 mL | ORAL_SOLUTION | Freq: Two times a day (BID) | ORAL | Status: DC | PRN

## 2012-10-06 NOTE — Assessment & Plan Note (Addendum)
The patient has not been experiencing any severe or dyspnea or exacerbation of CHF and he is doing well on Lasix 20 mg daily.  If he takes too much Lasix he has problems with his orthostatic hypotension and autonomic neuropathy.

## 2012-10-06 NOTE — Patient Instructions (Addendum)
Lab Today    Your physician recommends that you schedule a follow-up appointment in: 2 months.  

## 2012-10-06 NOTE — Assessment & Plan Note (Addendum)
The patient is not having any symptoms from his atrial fibrillation.  He has had no TIA or stroke symptoms.  He refuses apixaban and put himself back on a baby aspirin daily.

## 2012-10-06 NOTE — Progress Notes (Signed)
Marco Gregory Date of Birth:  1921/03/29 [facility] [address] [place], [place]  [address] [phone]         Fax   [phone]  History of Present Illness: This pleasant 79 year old gentleman is seen for a scheduled followup office visit. He has a past history of autonomic dysfunction with orthostatic hypotension.  He has a past history of congestive heart failure responsive to Lasix.  He has a history of paroxysmal atrial fibrillation.  He has a history of multiple falls.  Recently Dr. Graciela Husbands prescribed anticoagulants in the form of Eliquis but the patient has refused to take it because of its high cost.  At the beginning of the year the patient was very busy running back and forth to check on his wife who was still a patient in the nursing home.  Now the patient's wife has returned home and they are both at home and they have home health coming in for physical therapy.  In autumn of 2013 the patient fell and broke 2 ribs and was subsequently admitted to [facility] with another exacerbation of CHF and himself had to be transferred to keep him in place until he was strong enough to return home.  Current Outpatient Prescriptions  Medication Sig Dispense Refill  . acetaminophen (TYLENOL) 500 MG tablet Take 500-1,000 mg by mouth every 6 (six) hours as needed. For pain      . aspirin 81 MG tablet Take 81 mg by mouth daily.      [name] azithromycin (ZITHROMAX Z-PAK) 250 MG tablet Take as directed on pack  6 tablet  0  . digoxin (LANOXIN) 0.125 MG tablet Take 125 mcg by mouth daily.      . dorzolamide-timolol (COSOPT) 22.3-6.8 MG/ML ophthalmic solution Place 1 drop into both eyes daily.       . finasteride (PROSCAR) 5 MG tablet Take 5 mg by mouth daily.       . fludrocortisone (FLORINEF) 0.1 MG tablet Take 0.1 mg by mouth daily.      . furosemide (LASIX) 20 MG tablet Take 20 mg by mouth daily.      [name] HYDROcodone-homatropine (HYCODAN) 5-1.5 MG/5ML syrup Take 5  mLs by mouth 2 (two) times daily as needed for cough.  120 mL  0  . levothyroxine (SYNTHROID, LEVOTHROID) 150 MCG tablet Take 1 tablet (150 mcg total) by mouth daily before breakfast.  30 tablet  5  . meloxicam (MOBIC) 7.5 MG tablet Take 1 tablet (7.5 mg total) by mouth daily.  30 tablet  0  . midodrine (PROAMATINE) 5 MG tablet Take 1 tablet (5 mg total) by mouth 3 (three) times daily. When blood pressure is low      . Multiple Vitamin (MULTIVITAMIN WITH MINERALS) TABS Take 1 tablet by mouth daily.      [name] pyridostigmine (MESTINON) 60 MG tablet Take 60 mg by mouth 3 (three) times daily. Breakfast lunch and dinner      . tamsulosin (FLOMAX) 0.4 MG CAPS        No current facility-administered medications for this visit.    No Known Allergies  Patient Active Problem List  Diagnosis  . OBSTRUCTIVE SLEEP APNEA  . Atrial fibrillation  . DYSAUTONOMIA  . PPM-St.Jude  . Overactive bladder  . COPD exacerbation  . Chronic diastolic heart failure  . Orthostasis  . Myasthenia gravis  . Pancreatic mass  . Long-term (current) use of anticoagulants  . PAF (paroxysmal atrial fibrillation)  History  Smoking status  . Former Smoker -- 1.00 packs/day for 25 years  . Types: Cigarettes  . Quit date: 08/09/1965  Smokeless tobacco  . Never Used    History  Alcohol Use No    Comment: "last alcohol ~ 2008"    Family History  Problem Relation Age of Onset  . Cancer Mother 85    GYN    Review of Systems: Constitutional: no fever chills diaphoresis or fatigue or change in weight.  Head and neck: no hearing loss, no epistaxis, no photophobia or visual disturbance. Respiratory: No cough, shortness of breath or wheezing. Cardiovascular: No chest pain peripheral edema, palpitations. Gastrointestinal: No abdominal distention, no abdominal pain, no change in bowel habits hematochezia or melena. Genitourinary: No dysuria, no frequency, no urgency, no nocturia. Musculoskeletal:No arthralgias, no  back pain, no gait disturbance or myalgias. Neurological: No dizziness, no headaches, no numbness, no seizures, no syncope, no weakness, no tremors. Hematologic: No lymphadenopathy, no easy bruising. Psychiatric: No confusion, no hallucinations, no sleep disturbance.    Physical Exam: Filed Vitals:   10/06/12 1357  BP: 108/68  Pulse: 60   the general appearance reveals a elderly gentleman in no distress.  He walks with a cane.  He is alert and knows his medicines well.The head and neck exam reveals pupils equal and reactive.  Extraocular movements are full.  There is no scleral icterus.  The mouth and pharynx are normal.  The neck is supple.  The carotids reveal no bruits.  The jugular venous pressure is normal.  The  thyroid is not enlarged.  There is no lymphadenopathy.  The chest is clear to percussion and auscultation.  There are no rales or rhonchi.  Expansion of the chest is symmetrical.  The precordium is quiet.  The first heart sound is normal.  The second heart sound is physiologically split.  There is no murmur gallop rub or click.  There is no abnormal lift or heave.  The abdomen is soft and nontender.  The bowel sounds are normal.  The liver and spleen are not enlarged.  There are no abdominal masses.  There are no abdominal bruits.  Extremities reveal good pedal pulses.  There is no phlebitis or edema.  There is no cyanosis or clubbing.  Strength is normal and symmetrical in all extremities.  There is no lateralizing weakness.  There are no sensory deficits.  The skin is warm and dry.  There is no rash.     Assessment / Plan:  Continue same medication.  Recheck in 2 months for followup office visit.  We are checking lab work today

## 2012-10-06 NOTE — Assessment & Plan Note (Signed)
Generally his blood pressures have been satisfactory and not to low and not too high.  He is able to ambulate with a cane.

## 2012-10-08 NOTE — Progress Notes (Signed)
Quick Note:  Please report to patient. The recent labs are stable. Continue same medication and careful diet. ______ 

## 2012-11-04 ENCOUNTER — Telehealth: Payer: Self-pay

## 2012-11-04 NOTE — Telephone Encounter (Signed)
Okay to refill furosemide 20 mg daily for 6 months

## 2012-11-04 NOTE — Telephone Encounter (Signed)
PATIENT CALLING SAYS THAT HIS PHARMACY, [facility], NEEDS PRIOR AUTHORIZATION FROM PHYSICIAN ON MEDICATION FUROSEMIDE 20 MG. PATIENT'S SEES DR Milus Glazier.   PATIENT CALL BACK: [phone] PATIENT PHARMACY: [facility]

## 2012-11-06 ENCOUNTER — Other Ambulatory Visit: Payer: Self-pay | Admitting: Family Medicine

## 2012-11-06 DIAGNOSIS — L97519 Non-pressure chronic ulcer of other part of right foot with unspecified severity: Secondary | ICD-10-CM

## 2012-11-06 MED ORDER — FUROSEMIDE 20 MG PO TABS: 20.0000 mg | ORAL_TABLET | Freq: Every day | ORAL | Status: DC

## 2012-11-06 NOTE — Telephone Encounter (Signed)
I have written this for the patient per Dr Loma Boston order.

## 2012-11-13 ENCOUNTER — Encounter: Payer: Self-pay | Admitting: Cardiology

## 2012-11-13 ENCOUNTER — Ambulatory Visit (INDEPENDENT_AMBULATORY_CARE_PROVIDER_SITE_OTHER): Payer: Medicare Other | Admitting: Cardiology

## 2012-11-13 VITALS — BP 122/64 | HR 76 | Ht 75.0 in | Wt 208.0 lb

## 2012-11-13 DIAGNOSIS — G7000 Myasthenia gravis without (acute) exacerbation: Secondary | ICD-10-CM

## 2012-11-13 DIAGNOSIS — I5032 Chronic diastolic (congestive) heart failure: Secondary | ICD-10-CM

## 2012-11-13 DIAGNOSIS — N3281 Overactive bladder: Secondary | ICD-10-CM

## 2012-11-13 DIAGNOSIS — I480 Paroxysmal atrial fibrillation: Secondary | ICD-10-CM

## 2012-11-13 DIAGNOSIS — N318 Other neuromuscular dysfunction of bladder: Secondary | ICD-10-CM

## 2012-11-13 DIAGNOSIS — I4891 Unspecified atrial fibrillation: Secondary | ICD-10-CM

## 2012-11-13 NOTE — Progress Notes (Signed)
Marco Gregory Date of Birth:  1921-04-05 [facility] [address] [place], [place]  [address] [phone]         Fax   [phone]  History of Present Illness: This pleasant 79 year old gentleman is seen for a scheduled followup office visit. He has a past history of autonomic dysfunction with orthostatic hypotension. He has a past history of congestive heart failure responsive to Lasix. He has a history of paroxysmal atrial fibrillation. He has a history of multiple falls. Recently Dr. Graciela Husbands prescribed anticoagulants in the form of Eliquis but the patient has refused to take it because of its high cost. At the beginning of the year the patient was very busy running back and forth to check on his wife who was still a patient in the nursing home. Now the patient's wife has returned home and they are both at home . In autumn of 2013 the patient fell and broke 2 ribs and was subsequently admitted to [facility] with another exacerbation of CHF and himself had to be transferred to keep him in place until he was strong enough to return home.  The patient had an echocardiogram 03/11/12 showing an ejection fraction of 65-70% with mild aortic stenosis, moderate aortic insufficiency, and mild mitral regurgitation.  Pulmonary artery pressure was elevated at 47.   Current Outpatient Prescriptions  Medication Sig Dispense Refill  . acetaminophen (TYLENOL) 500 MG tablet Take 500-1,000 mg by mouth every 6 (six) hours as needed. For pain      . aspirin 81 MG tablet Take 81 mg by mouth daily.      . digoxin (LANOXIN) 0.125 MG tablet Take 125 mcg by mouth daily.      . dorzolamide-timolol (COSOPT) 22.3-6.8 MG/ML ophthalmic solution Place 1 drop into both eyes daily.       . finasteride (PROSCAR) 5 MG tablet Take 5 mg by mouth daily.       . fludrocortisone (FLORINEF) 0.1 MG tablet Take 0.1 mg by mouth daily.      . furosemide (LASIX) 20 MG tablet Take 1 tablet (20 mg total)  by mouth daily.  30 tablet  5  . HYDROcodone-homatropine (HYCODAN) 5-1.5 MG/5ML syrup Take 5 mLs by mouth 2 (two) times daily as needed for cough.  120 mL  0  . levothyroxine (SYNTHROID, LEVOTHROID) 150 MCG tablet Take 1 tablet (150 mcg total) by mouth daily before breakfast.  30 tablet  5  . meloxicam (MOBIC) 7.5 MG tablet Take 1 tablet (7.5 mg total) by mouth daily.  30 tablet  0  . midodrine (PROAMATINE) 5 MG tablet Take 1 tablet (5 mg total) by mouth 3 (three) times daily. When blood pressure is low      . Multiple Vitamin (MULTIVITAMIN WITH MINERALS) TABS Take 1 tablet by mouth daily.      [name] pyridostigmine (MESTINON) 60 MG tablet Take 60 mg by mouth 3 (three) times daily. Breakfast lunch and dinner      . tamsulosin (FLOMAX) 0.4 MG CAPS        No current facility-administered medications for this visit.    No Known Allergies  Patient Active Problem List  Diagnosis  . OBSTRUCTIVE SLEEP APNEA  . Atrial fibrillation  . DYSAUTONOMIA  . PPM-St.Jude  . Overactive bladder  . COPD exacerbation  . Chronic diastolic heart failure  . Orthostasis  . Myasthenia gravis  . Pancreatic mass  . Long-term (current) use of anticoagulants  . PAF (paroxysmal atrial fibrillation)  History  Smoking status  . Former Smoker -- 1.00 packs/day for 25 years  . Types: Cigarettes  . Quit date: 08/09/1965  Smokeless tobacco  . Never Used    History  Alcohol Use No    Comment: "last alcohol ~ 2008"    Family History  Problem Relation Age of Onset  . Cancer Mother 18    GYN    Review of Systems: Constitutional: no fever chills diaphoresis or fatigue or change in weight.  Head and neck: no hearing loss, no epistaxis, no photophobia or visual disturbance. Respiratory: No cough, shortness of breath or wheezing. Cardiovascular: No chest pain peripheral edema, palpitations. Gastrointestinal: No abdominal distention, no abdominal pain, no change in bowel habits hematochezia or  melena. Genitourinary: No dysuria, no frequency, no urgency, no nocturia. Musculoskeletal:No arthralgias, no back pain, no gait disturbance or myalgias. Neurological: No dizziness, no headaches, no numbness, no seizures, no syncope, no weakness, no tremors. Hematologic: No lymphadenopathy, no easy bruising. Psychiatric: No confusion, no hallucinations, no sleep disturbance.    Physical Exam: Filed Vitals:   11/13/12 1448  BP: 122/64  Pulse: 76   the general appearance reveals a well-developed elderly gentleman in no distress.The head and neck exam reveals pupils equal and reactive.  Extraocular movements are full.  There is no scleral icterus.  The mouth and pharynx are normal.  The neck is supple.  The carotids reveal no bruits.  The jugular venous pressure is normal.  The  thyroid is not enlarged.  There is no lymphadenopathy.  The chest is clear to percussion and auscultation.  There are no rales or rhonchi.  Expansion of the chest is symmetrical.  The precordium is quiet.  The first heart sound is normal.  The second heart sound is physiologically split.  There is a grade 2/6 systolic ejection murmur at the left sternal edge.  No diastolic murmur..  There is no abnormal lift or heave.  The abdomen is soft and nontender.  The bowel sounds are normal.  The liver and spleen are not enlarged.  There are no abdominal masses.  There are no abdominal bruits.  Extremities reveal good pedal pulses.  There is no phlebitis or edema.  There is no cyanosis or clubbing.  Strength is normal and symmetrical in all extremities.  There is no lateralizing weakness.  There are no sensory deficits.  The skin is warm and dry.  There is no rash.     Assessment / Plan: Continue same medication.  Recheck in 3 months for followup office visit.  He is following up with Dr. Danielle Dess regarding his spinal stenosis.

## 2012-11-13 NOTE — Patient Instructions (Addendum)
Your physician recommends that you continue on your current medications as directed. Please refer to the Current Medication list given to you today.  Your physician wants you to follow-up in: 3 month ov You will receive a reminder letter in the mail two months in advance. If you don't receive a letter, please call our office to schedule the follow-up appointment.     

## 2012-11-13 NOTE — Assessment & Plan Note (Signed)
The patient continues to have nocturia 8-10 times a night.  He has seen a urologist.

## 2012-11-13 NOTE — Assessment & Plan Note (Signed)
The patient has not had any symptoms to suggest worsening CHF.  His weight is down 6 pounds since last visit.

## 2012-11-13 NOTE — Assessment & Plan Note (Signed)
Patient has not been ordered any recent atrial fibrillation.  He has an underlying ventricular pacemaker.  He has not had any TIA symptoms.

## 2012-11-13 NOTE — Assessment & Plan Note (Signed)
Patient is on Mestinon for myasthenia gravis.

## 2012-11-20 ENCOUNTER — Other Ambulatory Visit: Payer: Self-pay | Admitting: *Deleted

## 2012-11-20 MED ORDER — DIGOXIN 125 MCG PO TABS: 125.0000 ug | ORAL_TABLET | Freq: Every day | ORAL | Status: DC

## 2013-02-01 ENCOUNTER — Other Ambulatory Visit: Payer: Self-pay | Admitting: Cardiology

## 2013-02-01 DIAGNOSIS — E039 Hypothyroidism, unspecified: Secondary | ICD-10-CM

## 2013-02-07 ENCOUNTER — Encounter (HOSPITAL_COMMUNITY): Payer: Self-pay | Admitting: *Deleted

## 2013-02-07 ENCOUNTER — Emergency Department (HOSPITAL_COMMUNITY)
Admission: EM | Admit: 2013-02-07 | Discharge: 2013-02-07 | Disposition: A | Discharge status: Home / Self Care | Payer: Medicare Other | Attending: Emergency Medicine | Admitting: Emergency Medicine

## 2013-02-07 DIAGNOSIS — I498 Other specified cardiac arrhythmias: Secondary | ICD-10-CM | POA: Insufficient documentation

## 2013-02-07 DIAGNOSIS — Z8701 Personal history of pneumonia (recurrent): Secondary | ICD-10-CM | POA: Insufficient documentation

## 2013-02-07 DIAGNOSIS — R3915 Urgency of urination: Secondary | ICD-10-CM | POA: Insufficient documentation

## 2013-02-07 DIAGNOSIS — Z87891 Personal history of nicotine dependence: Secondary | ICD-10-CM | POA: Insufficient documentation

## 2013-02-07 DIAGNOSIS — Z7982 Long term (current) use of aspirin: Secondary | ICD-10-CM | POA: Insufficient documentation

## 2013-02-07 DIAGNOSIS — I509 Heart failure, unspecified: Secondary | ICD-10-CM | POA: Insufficient documentation

## 2013-02-07 DIAGNOSIS — G4730 Sleep apnea, unspecified: Secondary | ICD-10-CM | POA: Insufficient documentation

## 2013-02-07 DIAGNOSIS — K5900 Constipation, unspecified: Secondary | ICD-10-CM | POA: Insufficient documentation

## 2013-02-07 DIAGNOSIS — S98139A Complete traumatic amputation of one unspecified lesser toe, initial encounter: Secondary | ICD-10-CM | POA: Insufficient documentation

## 2013-02-07 DIAGNOSIS — Z79899 Other long term (current) drug therapy: Secondary | ICD-10-CM | POA: Insufficient documentation

## 2013-02-07 DIAGNOSIS — R338 Other retention of urine: Secondary | ICD-10-CM

## 2013-02-07 DIAGNOSIS — R339 Retention of urine, unspecified: Secondary | ICD-10-CM | POA: Insufficient documentation

## 2013-02-07 DIAGNOSIS — Z8639 Personal history of other endocrine, nutritional and metabolic disease: Secondary | ICD-10-CM | POA: Insufficient documentation

## 2013-02-07 DIAGNOSIS — Z9849 Cataract extraction status, unspecified eye: Secondary | ICD-10-CM | POA: Insufficient documentation

## 2013-02-07 DIAGNOSIS — I4891 Unspecified atrial fibrillation: Secondary | ICD-10-CM | POA: Insufficient documentation

## 2013-02-07 DIAGNOSIS — Z862 Personal history of diseases of the blood and blood-forming organs and certain disorders involving the immune mechanism: Secondary | ICD-10-CM | POA: Insufficient documentation

## 2013-02-07 DIAGNOSIS — I10 Essential (primary) hypertension: Secondary | ICD-10-CM | POA: Insufficient documentation

## 2013-02-07 DIAGNOSIS — M48061 Spinal stenosis, lumbar region without neurogenic claudication: Secondary | ICD-10-CM | POA: Insufficient documentation

## 2013-02-07 DIAGNOSIS — Z7901 Long term (current) use of anticoagulants: Secondary | ICD-10-CM | POA: Insufficient documentation

## 2013-02-07 DIAGNOSIS — G909 Disorder of the autonomic nervous system, unspecified: Secondary | ICD-10-CM | POA: Insufficient documentation

## 2013-02-07 DIAGNOSIS — E039 Hypothyroidism, unspecified: Secondary | ICD-10-CM | POA: Insufficient documentation

## 2013-02-07 DIAGNOSIS — Z950 Presence of cardiac pacemaker: Secondary | ICD-10-CM | POA: Insufficient documentation

## 2013-02-07 DIAGNOSIS — N318 Other neuromuscular dysfunction of bladder: Secondary | ICD-10-CM | POA: Insufficient documentation

## 2013-02-07 DIAGNOSIS — Z466 Encounter for fitting and adjustment of urinary device: Secondary | ICD-10-CM | POA: Insufficient documentation

## 2013-02-07 DIAGNOSIS — R109 Unspecified abdominal pain: Secondary | ICD-10-CM | POA: Insufficient documentation

## 2013-02-07 LAB — URINE MICROSCOPIC-ADD ON

## 2013-02-07 LAB — CBC WITH DIFFERENTIAL/PLATELET
Eosinophils Relative: 1 % (ref 0–5)
HCT: 40.2 % (ref 39.0–52.0)
Hemoglobin: 13.4 g/dL (ref 13.0–17.0)
Lymphocytes Relative: 17 % (ref 12–46)
MCHC: 33.3 g/dL (ref 30.0–36.0)
MCV: 94.4 fL (ref 78.0–100.0)
Monocytes Absolute: 0.6 10*3/uL (ref 0.1–1.0)
Monocytes Relative: 10 % (ref 3–12)
Neutro Abs: 4.3 10*3/uL (ref 1.7–7.7)
WBC: 5.9 10*3/uL (ref 4.0–10.5)

## 2013-02-07 LAB — POCT I-STAT, CHEM 8
BUN: 22 mg/dL (ref 6–23)
Chloride: 100 mEq/L (ref 96–112)
Creatinine, Ser: 1.3 mg/dL (ref 0.50–1.35)
Glucose, Bld: 119 mg/dL — ABNORMAL HIGH (ref 70–99)
Potassium: 4.2 mEq/L (ref 3.5–5.1)

## 2013-02-07 LAB — URINALYSIS, ROUTINE W REFLEX MICROSCOPIC
Bilirubin Urine: NEGATIVE
Ketones, ur: NEGATIVE mg/dL
Specific Gravity, Urine: 1.011 (ref 1.005–1.030)
pH: 6.5 (ref 5.0–8.0)

## 2013-02-07 NOTE — ED Notes (Signed)
Patient states that he took an extra lasix last night thinking that it would help him urinate.

## 2013-02-07 NOTE — ED Provider Notes (Signed)
History    CSN: [id number] Arrival date & time 02/07/13  0709  First MD Initiated Contact with Patient 02/07/13 [phone]     Chief Complaint  Patient presents with  . Urinary Retention  . Constipation   (Consider location/radiation/quality/duration/timing/severity/associated sxs/prior Treatment) HPI Comments: Patient states last night at 6 PM was the last time he was able to urinate. Since that time he's had growing discomfort and desire to urinate but unable to. The pain is now 10 out of 10 in the lower abdomen without radiation. He states for the last several weeks he's been on Flomax by urology for hesitancy with urination. He states he was doing well until last night. Also he recently started to antibiotics clindamycin and another one he does not know the name of last night. He denies any nausea, vomiting but states his last bowel movement was on Sunday. He denies any sensation of stool in his rectum.  The history is provided by the patient.   Past Medical History  Diagnosis Date  . Long-term (current) use of anticoagulants   . Glaucoma   . Gout   . Hyperthyroidism   . Orthostatic hypotension   . Redundant prepuce and phimosis   . Acute cystitis   . Hypertonicity of bladder   . HTN (hypertension)   . Atelectasis   . Dysautonomia   . Pacemaker- [employer]     DOI 2012  . CHF (congestive heart failure)     Previous preserved EF.  [name] Atrial fibrillation   . Sinus node dysfunction   . Pneumonia 08/2011    after hernia OR  . Hypothyroidism   . Sleep apnea     does not use cpap ; last used ~ 2011 (03/10/12)  . Spinal stenosis of lumbar region   . Chronic kidney disease [facility]     UTI  1 MO AGO TX MEDICALLY   . Osteomyelitis of toe of right foot ~ 2009    amputated 2nd toe   Past Surgical History  Procedure Laterality Date  . Elbow bursa surgery  04/07/2006    Right elbow septic olecranon bursitis  . Toe amputation  ~ 2009    Osteomyelitis, right foot second toe /   Right foot second ray amputation  . Cardiac catheterization  07/03/2002    EF 60-70% /  Normal left ventricular function. / Very mild trivial three-vessel coronary atherosclerosis. / There is no mitral regurgitation noted  . Tonsillectomy and adenoidectomy      "when I was a youngster"  . Appendectomy      "when I was a youngster"  . Inguinal hernia repair  ~ 2003/13    right  . Cataract extraction w/ intraocular lens  implant, bilateral  01/2005   Family History  Problem Relation Age of Onset  . Cancer Mother 49    GYN   History  Substance Use Topics  . Smoking status: Former Smoker -- 1.00 packs/day for 25 years    Types: Cigarettes    Quit date: 08/09/1965  . Smokeless tobacco: Never Used  . Alcohol Use: No     Comment: "last alcohol ~ 2008"    Review of Systems  Constitutional: Negative for fever and chills.  Gastrointestinal: Positive for abdominal pain and constipation. Negative for nausea and vomiting.  Genitourinary: Positive for urgency. Negative for dysuria, flank pain, penile swelling and scrotal swelling.  All other systems reviewed and are negative.    Allergies  Review of patient's allergies indicates  no known allergies.  Home Medications   Current Outpatient Rx  Name  Route  Sig  Dispense  Refill  . acetaminophen (TYLENOL) 500 MG tablet   Oral   Take 1,000 mg by mouth 2 (two) times daily as needed for pain. For pain         . aspirin 81 MG tablet   Oral   Take 81 mg by mouth every other day.          . clindamycin (CLEOCIN) 150 MG capsule   Oral   Take 150 mg by mouth 3 (three) times daily.         . digoxin (LANOXIN) 0.125 MG tablet   Oral   Take 1 tablet (125 mcg total) by mouth daily.   90 tablet   3   . dorzolamide-timolol (COSOPT) 22.3-6.8 MG/ML ophthalmic solution   Both Eyes   Place 1 drop into both eyes daily.          . finasteride (PROSCAR) 5 MG tablet   Oral   Take 5 mg by mouth daily.          . fludrocortisone  (FLORINEF) 0.1 MG tablet   Oral   Take 0.1 mg by mouth daily.         . furosemide (LASIX) 20 MG tablet   Oral   Take 1 tablet (20 mg total) by mouth daily.   30 tablet   5   . HYDROcodone-homatropine (HYCODAN) 5-1.5 MG/5ML syrup   Oral   Take 5 mLs by mouth 2 (two) times daily as needed for cough.   120 mL   0   . levofloxacin (LEVAQUIN) 750 MG tablet   Oral   Take 750 mg by mouth daily.         [name] levothyroxine (SYNTHROID, LEVOTHROID) 150 MCG tablet   Oral   Take 150 mcg by mouth daily before breakfast.         . midodrine (PROAMATINE) 5 MG tablet   Oral   Take 1 tablet (5 mg total) by mouth 3 (three) times daily. When blood pressure is low         . Multiple Vitamin (MULTIVITAMIN WITH MINERALS) TABS   Oral   Take 1 tablet by mouth daily.         [name] pyridostigmine (MESTINON) 60 MG tablet   Oral   Take 60 mg by mouth 3 (three) times daily. Breakfast lunch and dinner         . tamsulosin (FLOMAX) 0.4 MG CAPS   Oral   Take 0.4 mg by mouth daily.          BP 150/92  Pulse 92  Temp(Src) 97.5 F (36.4 C) (Oral)  Resp 24  SpO2 98% Physical Exam  Nursing note and vitals reviewed. Constitutional: He is oriented to person, place, and time. He appears well-developed and well-nourished. No distress.  HENT:  Head: Normocephalic and atraumatic.  Mouth/Throat: Oropharynx is clear and moist.  Eyes: Conjunctivae and EOM are normal. Pupils are equal, round, and reactive to light.  Neck: Normal range of motion. Neck supple.  Cardiovascular: Normal rate, regular rhythm and intact distal pulses.   No murmur heard. Pulmonary/Chest: Effort normal and breath sounds normal. No respiratory distress. He has no wheezes. He has no rales.  Abdominal: Soft. He exhibits no distension. There is no tenderness. There is no rebound and no guarding.  Exam of the abdomen done after Foley catheter was placed  Genitourinary: Rectum  normal.  No fetal impaction and prostate mildly  enlarged but nontender  Musculoskeletal: Normal range of motion. He exhibits no edema and no tenderness.  Neurological: He is alert and oriented to person, place, and time.  Skin: Skin is warm and dry. No rash noted. No erythema.  Psychiatric: He has a normal mood and affect. His behavior is normal.    ED Course  Procedures (including critical care time) Labs Reviewed  CBC WITH DIFFERENTIAL - Abnormal; Notable for the following:    Platelets 129 (*)    All other components within normal limits  URINALYSIS, ROUTINE W REFLEX MICROSCOPIC - Abnormal; Notable for the following:    Hgb urine dipstick SMALL (*)    All other components within normal limits  POCT I-STAT, CHEM 8 - Abnormal; Notable for the following:    Glucose, Bld 119 (*)    All other components within normal limits  URINE MICROSCOPIC-ADD ON   No results found. No diagnosis found.  MDM    Patient presents with abdominal discomfort and urinary retention. He has a history of being on Flomax for the last few weeks because of issues with his urine stream. He also states he did not have a bowel movement since Sunday but on exam has no signs of impaction. Foley catheter was placed the patient drained 2 L of urine with resolution of abdominal pain and discomfort. He is now very comfortable.  Well appearing with normal vital signs. CBC within normal limits. I-STAT was normal creatinine. UA pending to ensure no signs of infection.  Will leave foley in place and have him follow up with Dr. Perley Jain on Monday for removal.  9:26 AM UA wnl.  No infection will d/c.  Gwyneth Sprout, MD 02/07/13 [phone]

## 2013-02-07 NOTE — ED Notes (Signed)
Reports being unable to urinate since last night 6pm and having abd discomfort. Also thinks he needs a suppository, last bm was Sunday.

## 2013-02-28 ENCOUNTER — Ambulatory Visit: Payer: Medicare Other | Admitting: Cardiology

## 2013-03-19 ENCOUNTER — Encounter: Payer: Self-pay | Admitting: Cardiology

## 2013-03-19 ENCOUNTER — Ambulatory Visit (INDEPENDENT_AMBULATORY_CARE_PROVIDER_SITE_OTHER): Payer: Medicare Other | Admitting: Cardiology

## 2013-03-19 VITALS — BP 138/76 | HR 60 | Ht 75.0 in | Wt 206.8 lb

## 2013-03-19 DIAGNOSIS — I4891 Unspecified atrial fibrillation: Secondary | ICD-10-CM

## 2013-03-19 DIAGNOSIS — I480 Paroxysmal atrial fibrillation: Secondary | ICD-10-CM

## 2013-03-19 DIAGNOSIS — I951 Orthostatic hypotension: Secondary | ICD-10-CM

## 2013-03-19 DIAGNOSIS — I5032 Chronic diastolic (congestive) heart failure: Secondary | ICD-10-CM

## 2013-03-19 MED ORDER — MIDODRINE HCL 10 MG PO TABS: 10.0000 mg | ORAL_TABLET | Freq: Every day | ORAL | Status: DC

## 2013-03-19 NOTE — Assessment & Plan Note (Signed)
His orthostatic hypotension is improved on Florinef and midodrine

## 2013-03-19 NOTE — Progress Notes (Signed)
Marco Gregory Date of Birth:  February 20, 1921 [facility] [address] [place], [place]  [address] [phone]         Fax   [phone]  History of Present Illness: This pleasant 79 year old gentleman is seen for a scheduled followup office visit. He has a past history of autonomic dysfunction with orthostatic hypotension. He has a past history of congestive heart failure responsive to Lasix. He has a history of paroxysmal atrial fibrillation. He has a history of multiple falls. Recently Dr. Graciela Husbands prescribed anticoagulants in the form of Eliquis but the patient has refused to take it because of its high cost. At the beginning of the year the patient was very busy running back and forth to check on his wife who was still a patient in the nursing home. Now the patient's wife has returned home and they are both at home . In autumn of 2013 the patient fell and broke 2 ribs and was subsequently admitted to [facility] with another exacerbation of CHF and himself had to be transferred to keep him in place until he was strong enough to return home. The patient had an echocardiogram 03/11/12 showing an ejection fraction of 65-70% with mild aortic stenosis, moderate aortic insufficiency, and mild mitral regurgitation. Pulmonary artery pressure was elevated at 47. Since last visit he has been doing better and is the caregiver for his wife.  His main problems are his spinal stenosis and has occasional dizziness and his problems with weak bladder and nocturia 6-8 times a night.  Current Outpatient Prescriptions  Medication Sig Dispense Refill  . acetaminophen (TYLENOL) 500 MG tablet Take 1,000 mg by mouth 2 (two) times daily as needed for pain. For pain      . albuterol (PROVENTIL) (2.5 MG/3ML) 0.083% nebulizer solution Take 2.5 mg by nebulization every 6 (six) hours as needed for wheezing.      [name] aspirin 81 MG tablet Take 81 mg by mouth every other day.       . digoxin  (LANOXIN) 0.125 MG tablet Take 1 tablet (125 mcg total) by mouth daily.  90 tablet  3  . dorzolamide-timolol (COSOPT) 22.3-6.8 MG/ML ophthalmic solution Place 1 drop into both eyes daily.       . finasteride (PROSCAR) 5 MG tablet Take 5 mg by mouth daily.       . fludrocortisone (FLORINEF) 0.1 MG tablet Take 0.1 mg by mouth daily.      . furosemide (LASIX) 20 MG tablet Take 1 tablet (20 mg total) by mouth daily.  30 tablet  5  . levothyroxine (SYNTHROID, LEVOTHROID) 175 MCG tablet Take 175 mcg by mouth daily before breakfast.      . Meloxicam (MOBIC PO) Take by mouth daily.      . midodrine (PROAMATINE) 10 MG tablet Take 1 tablet (10 mg total) by mouth daily.  90 tablet  3  . mirabegron ER (MYRBETRIQ) 50 MG TB24 tablet Take 50 mg by mouth daily.      . Multiple Vitamin (MULTIVITAMIN WITH MINERALS) TABS Take 1 tablet by mouth daily.      [name] pyridostigmine (MESTINON) 60 MG tablet Take 60 mg by mouth 3 (three) times daily. Breakfast lunch and dinner      . tamsulosin (FLOMAX) 0.4 MG CAPS Take 0.4 mg by mouth daily.       No current facility-administered medications for this visit.    No Known Allergies  Patient Active Problem List   Diagnosis Date  Noted  . Atrial fibrillation 12/03/2008    Priority: High  . PAF (paroxysmal atrial fibrillation) 08/07/2012  . Long-term (current) use of anticoagulants   . Chronic diastolic heart failure 04/19/2012  . Orthostasis 04/19/2012  . Myasthenia gravis 04/19/2012  . Pancreatic mass 04/19/2012  . COPD exacerbation 03/10/2012  . Overactive bladder 04/05/2011  . DYSAUTONOMIA 12/03/2008  . 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 OBSTRUCTIVE SLEEP APNEA 11/24/2007    History  Smoking status  . Former Smoker -- 1.00 packs/day for 25 years  . Types: Cigarettes  . Quit date: 08/09/1965  Smokeless tobacco  . Never Used    History  Alcohol Use No    Comment: "last alcohol ~ 2008"    Family History  Problem Relation Age of Onset  . Cancer Mother 64    GYN     Review of Systems: Constitutional: no fever chills diaphoresis or fatigue or change in weight.  Head and neck: no hearing loss, no epistaxis, no photophobia or visual disturbance. Respiratory: No cough, shortness of breath or wheezing. Cardiovascular: No chest pain peripheral edema, palpitations. Gastrointestinal: No abdominal distention, no abdominal pain, no change in bowel habits hematochezia or melena. Genitourinary: Positive for nocturia 6-8 times a night Musculoskeletal:No arthralgias, no back pain, no gait disturbance or myalgias. Neurological: No dizziness, no headaches, no numbness, no seizures, no syncope, no weakness, no tremors. Hematologic: No lymphadenopathy, no easy bruising. Psychiatric: No confusion, no hallucinations, no sleep disturbance.    Physical Exam: Filed Vitals:   03/19/13 1535  BP: 138/76  Pulse: 60   the general appearance reveals a well-developed well-nourished gentleman in no distress.The head and neck exam reveals pupils equal and reactive.  Extraocular movements are full.  There is no scleral icterus.  The mouth and pharynx are normal.  The neck is supple.  The carotids reveal no bruits.  The jugular venous pressure is normal.  The  thyroid is not enlarged.  There is no lymphadenopathy.  The chest is clear to percussion and auscultation.  There are no rales or rhonchi.  Expansion of the chest is symmetrical.  The precordium is quiet.  The first heart sound is normal.  The second heart sound is physiologically split.  There is no murmur gallop rub or click.  There is no abnormal lift or heave.  The abdomen is soft and nontender.  The bowel sounds are normal.  The liver and spleen are not enlarged.  There are no abdominal masses.  There are no abdominal bruits.  Extremities reveal good pedal pulses.  There is trace pedal edema. There is no cyanosis or clubbing.  Strength is normal and symmetrical in all extremities.  There is no lateralizing weakness.  There  are no sensory deficits.  The skin is warm and dry.  There is no rash.  His weight is down 2 pounds since last visit .    Assessment / Plan: Continue same medication.  Recheck in 3 months for followup office visit.

## 2013-03-19 NOTE — Assessment & Plan Note (Signed)
Since last visit the patient has not been aware of any tachycardia or atrial fibrillation

## 2013-03-19 NOTE — Patient Instructions (Signed)
Your physician recommends that you continue on your current medications as directed. Please refer to the Current Medication list given to you today.  Your physician recommends that you schedule a follow-up appointment in: 3 month ov 

## 2013-03-19 NOTE — Assessment & Plan Note (Signed)
The patient has not been experiencing any symptoms of recurrent CHF.

## 2013-03-30 ENCOUNTER — Other Ambulatory Visit: Payer: Self-pay | Admitting: Cardiology

## 2013-03-30 DIAGNOSIS — R05 Cough: Secondary | ICD-10-CM

## 2013-03-30 DIAGNOSIS — R059 Cough, unspecified: Secondary | ICD-10-CM

## 2013-04-01 IMAGING — CR DG CHEST 2V
2 series · 2 of 2 positions shown · non-contrast
Comparison: Chest CT 08/31/2011, 08/31/2011, and 04/14/2011

CLINICAL DATA: Shortness of breath and dizziness.  Cough and
congestion

CHEST - 2 VIEW

[w chest pa]
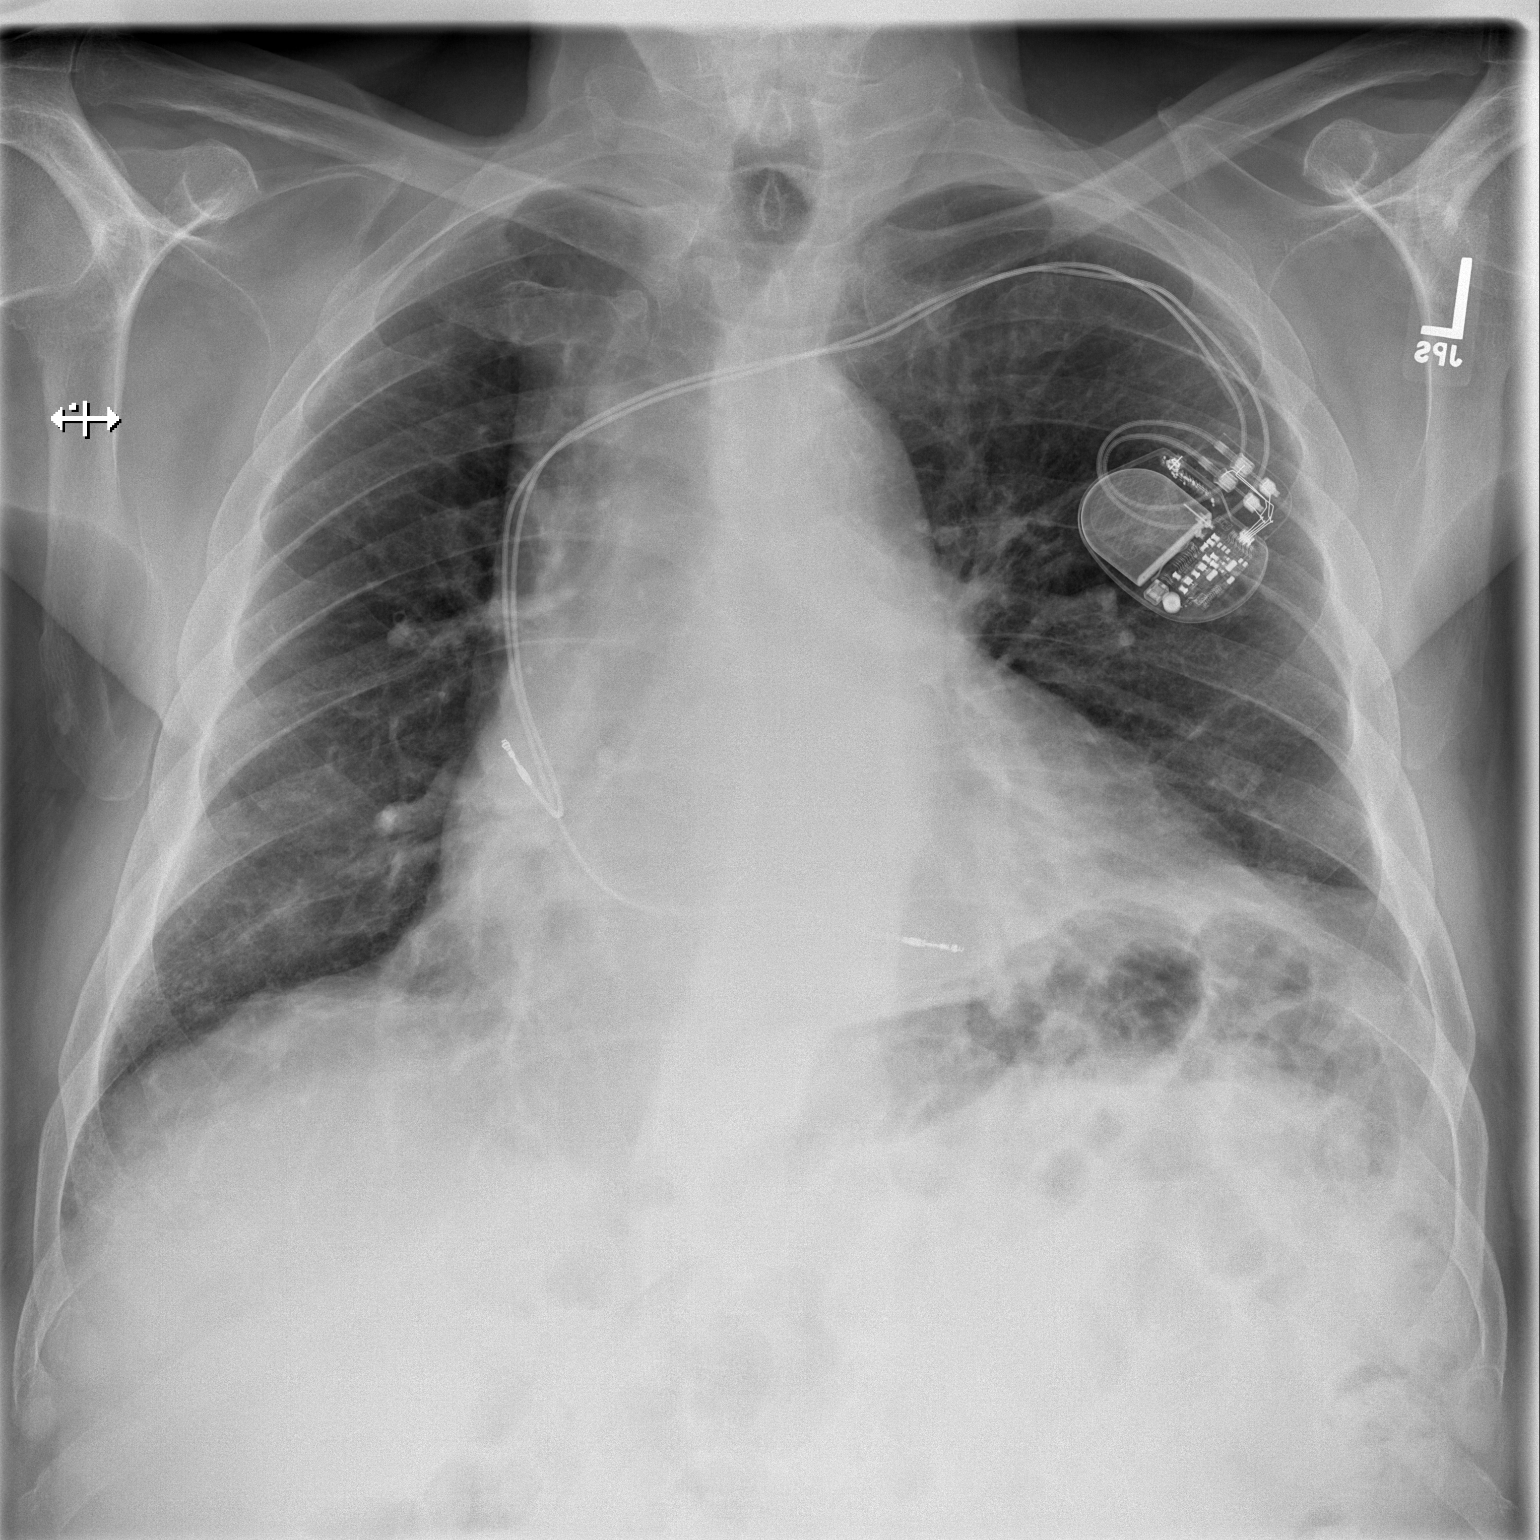

[w chest lat]
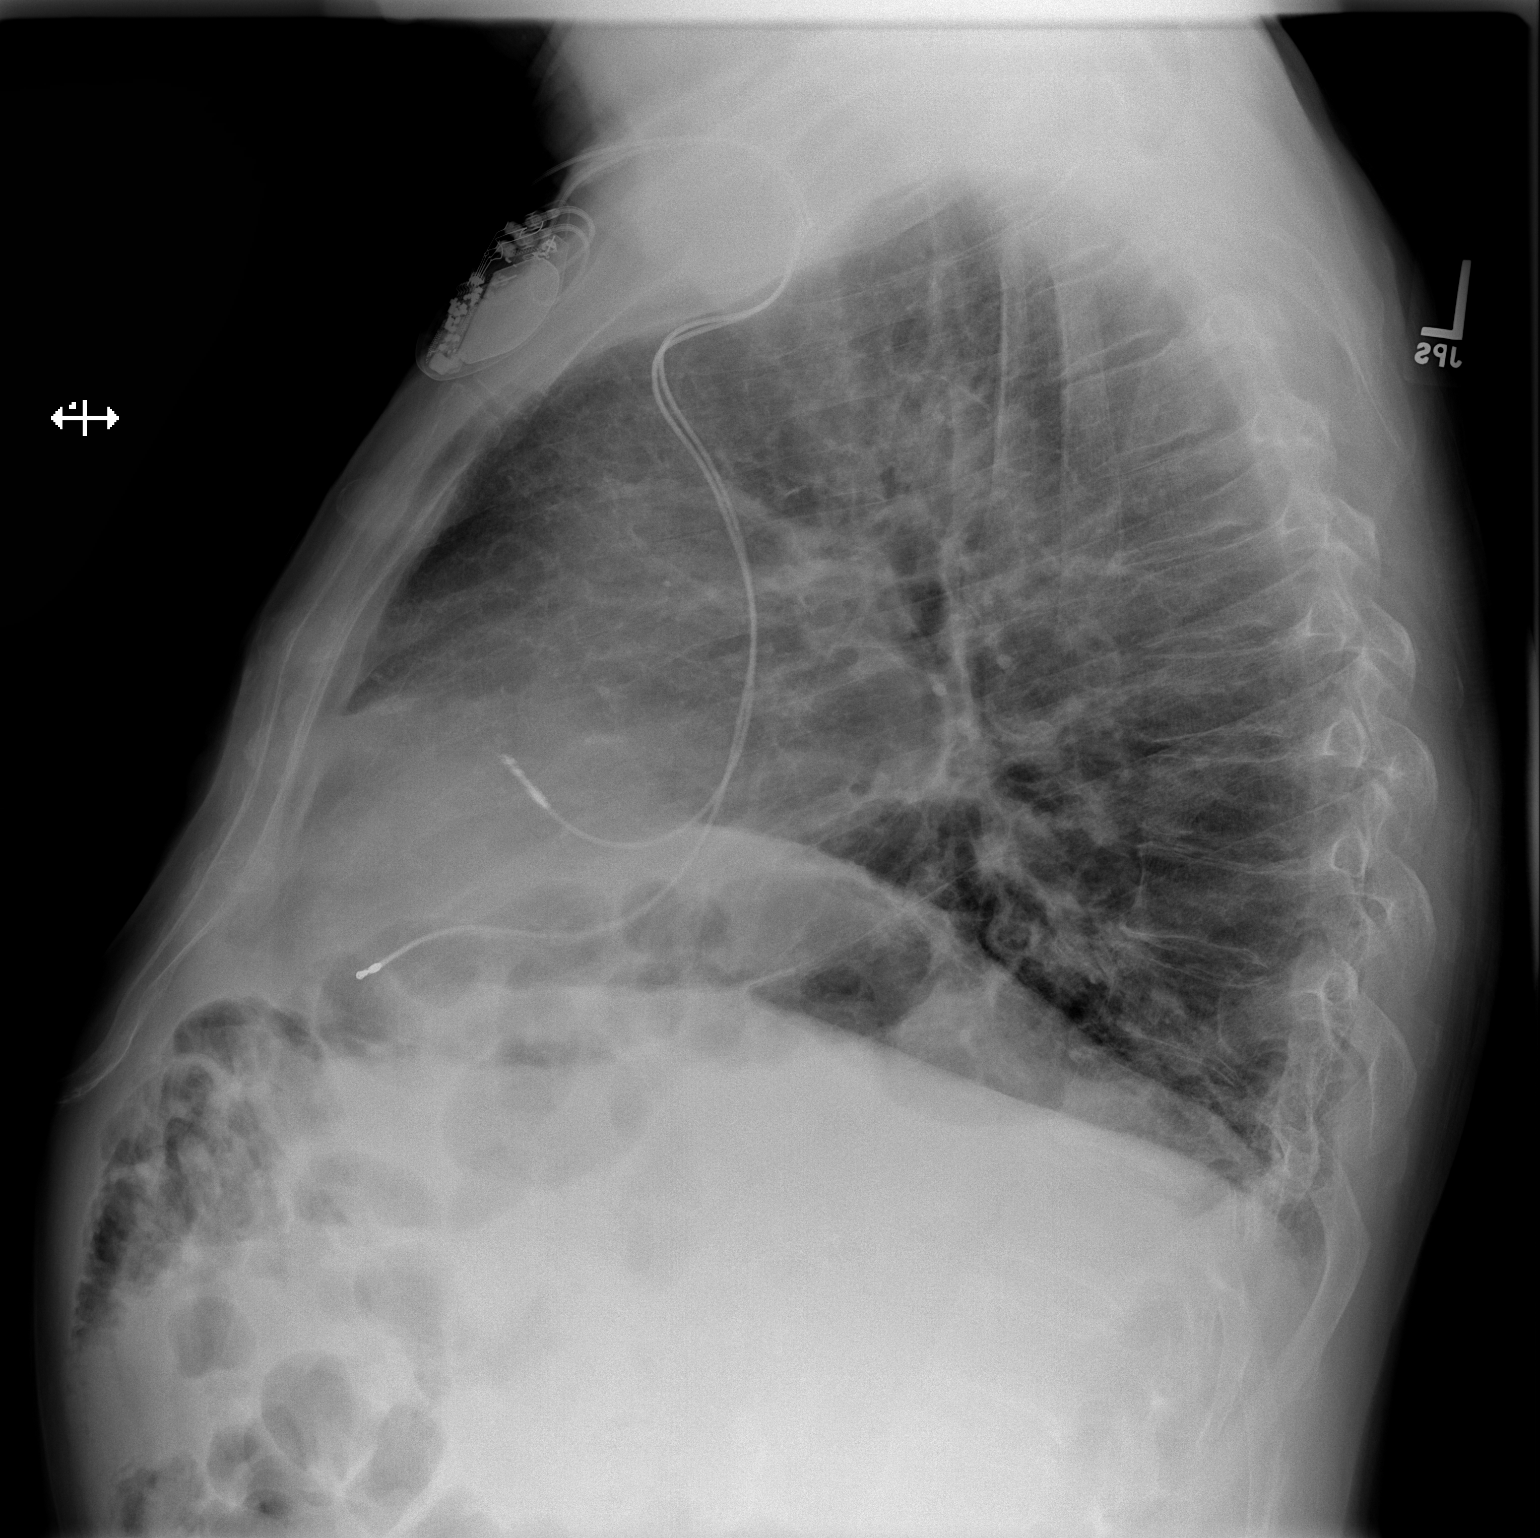

[2 of 2 positions shown; findings below may reference images not displayed]

FINDINGS: Left chest wall battery pack with pacer leads terminating
in the right atrium, right ventricle.  Cardiomegaly is stable.
Pulmonary vascularity mildly congested.  Lung volumes slightly low,
with chronic slight elevation of the left hemidiaphragm.  Mild
subpleural reticulation/interstitial prominence in the lung bases
is unchanged.  No focal airspace disease, pulmonary edema or
pleural effusion.  The bones are osteopenic.  No acute bony
abnormality identified.
IMPRESSION: Stable examination.  Cardiomegaly with mild pulmonary vascular
congestion.  Slight stable interstitial prominence at the lung
bases.

## 2013-04-04 ENCOUNTER — Encounter (HOSPITAL_BASED_OUTPATIENT_CLINIC_OR_DEPARTMENT_OTHER): Payer: Medicare Other | Attending: General Surgery

## 2013-04-04 DIAGNOSIS — L84 Corns and callosities: Secondary | ICD-10-CM | POA: Insufficient documentation

## 2013-04-04 DIAGNOSIS — S98139A Complete traumatic amputation of one unspecified lesser toe, initial encounter: Secondary | ICD-10-CM | POA: Insufficient documentation

## 2013-04-04 DIAGNOSIS — L97509 Non-pressure chronic ulcer of other part of unspecified foot with unspecified severity: Secondary | ICD-10-CM | POA: Insufficient documentation

## 2013-04-04 DIAGNOSIS — E039 Hypothyroidism, unspecified: Secondary | ICD-10-CM | POA: Insufficient documentation

## 2013-04-04 DIAGNOSIS — G609 Hereditary and idiopathic neuropathy, unspecified: Secondary | ICD-10-CM | POA: Insufficient documentation

## 2013-04-04 DIAGNOSIS — I4891 Unspecified atrial fibrillation: Secondary | ICD-10-CM | POA: Insufficient documentation

## 2013-04-04 DIAGNOSIS — I10 Essential (primary) hypertension: Secondary | ICD-10-CM | POA: Insufficient documentation

## 2013-04-04 DIAGNOSIS — M2010 Hallux valgus (acquired), unspecified foot: Secondary | ICD-10-CM | POA: Insufficient documentation

## 2013-04-04 DIAGNOSIS — Z950 Presence of cardiac pacemaker: Secondary | ICD-10-CM | POA: Insufficient documentation

## 2013-04-05 NOTE — Progress Notes (Signed)
[facility]  NAME:  Marco, Gregory NO.:  [id number]  MEDICAL RECORD NO.:  [id number]      DATE OF BIRTH:  [birth year]  PHYSICIAN:  Ardath Sax, M.D.      VISIT DATE:  04/04/2013                                  OFFICE VISIT   HISTORY OF PRESENT ILLNESS:  Marco Gregory is a very interesting 79- year-old gentleman who comes to [place] with a  neuropathic foot ulcer on his plantar aspect of his right foot at the MP junction.  The man is a really healthy man.  His only medicines are Tylenol, Lanoxin, Proscar, Lasix, Synthroid and Flomax.  He has a history of a pacemaker.  He also has a history of a right second toe amputation which has resulted in a valgus deformity of his right great toe, so that it really deviates medially to quite a degree.  Because of this he has got a foot ulcer which looks just like a diabetic foot ulcer on the plantar aspect of his first MP area plantar aspect.  It is about a cm in diameter.  Today, I debrided it of callus and we were treating it with some offloading felt and a Darco shoe and an Endoform dressing.  This man had a blood pressure that was measured today of 186/96.  He sees his doctor about hypertension.  He had a pulse of 60, respirations 19, he weighs 200 pounds, he is 6 feet 3 inches.  We plan to continue treating this until it is ready for a graft, and then we will put on an Apligraf, and I am going to get him fitted for some special shoes.  I spoke with him about the possibility of some orthopedic procedure being done and he is really not interested and I do not blame him at all.  So his diagnosis is a neuropathic ulcer, plantar aspect, first MP joint right foot.  Other diagnosesare hypertension, history of pacemaker, history of spinal stenosis, atrial fibrillation, and he is hypothyroid.     Ardath Sax, M.D.     PP/MEDQ  D:  04/04/2013  T:  04/05/2013  Job:  [id number]

## 2013-04-13 ENCOUNTER — Encounter (HOSPITAL_BASED_OUTPATIENT_CLINIC_OR_DEPARTMENT_OTHER): Payer: Medicare Other | Attending: General Surgery

## 2013-04-13 DIAGNOSIS — G5790 Unspecified mononeuropathy of unspecified lower limb: Secondary | ICD-10-CM | POA: Insufficient documentation

## 2013-04-13 DIAGNOSIS — L97509 Non-pressure chronic ulcer of other part of unspecified foot with unspecified severity: Secondary | ICD-10-CM | POA: Insufficient documentation

## 2013-05-11 ENCOUNTER — Encounter (HOSPITAL_BASED_OUTPATIENT_CLINIC_OR_DEPARTMENT_OTHER): Payer: Medicare Other | Attending: General Surgery

## 2013-05-11 ENCOUNTER — Telehealth: Payer: Self-pay

## 2013-05-11 DIAGNOSIS — L97509 Non-pressure chronic ulcer of other part of unspecified foot with unspecified severity: Secondary | ICD-10-CM | POA: Insufficient documentation

## 2013-05-11 DIAGNOSIS — L84 Corns and callosities: Secondary | ICD-10-CM | POA: Insufficient documentation

## 2013-05-11 DIAGNOSIS — G5790 Unspecified mononeuropathy of unspecified lower limb: Secondary | ICD-10-CM | POA: Insufficient documentation

## 2013-05-11 NOTE — Telephone Encounter (Signed)
Called him, left message for him to call me back.  

## 2013-05-11 NOTE — Telephone Encounter (Signed)
Pt is calling because he would like Dr L nurse to call him Call back number is [phone]

## 2013-05-12 ENCOUNTER — Telehealth: Payer: Self-pay

## 2013-05-12 NOTE — Telephone Encounter (Signed)
Spoke with patient and advised to RTC. He will come in tomorrow am to see Dr. Milus Glazier

## 2013-05-12 NOTE — Telephone Encounter (Signed)
Pt is needing to talk with someone he has not had a bowel movement in over a week   Best number [phone]

## 2013-05-13 NOTE — Telephone Encounter (Signed)
lmom to cb. 

## 2013-05-14 NOTE — Telephone Encounter (Signed)
Pt will be in to see Dr. Elbert Ewings today

## 2013-05-16 ENCOUNTER — Telehealth: Payer: Self-pay

## 2013-05-16 NOTE — Telephone Encounter (Signed)
Patient wants to talk with Dr Cain Saupe nurse regarding his last OV  [phone]

## 2013-05-16 NOTE — Telephone Encounter (Signed)
Patient advised to disregard message, he has already taken care of this.

## 2013-05-29 ENCOUNTER — Other Ambulatory Visit: Payer: Self-pay | Admitting: Cardiology

## 2013-05-30 NOTE — Telephone Encounter (Signed)
Spoke with patient and he has been taking Synthroid 150 mcg daily, will send Rx to pharmacy. Has ov next week for increased dizziness, meclizine helps. Will get thyroid labs then.

## 2013-06-08 ENCOUNTER — Other Ambulatory Visit: Payer: Medicare Other

## 2013-06-08 ENCOUNTER — Ambulatory Visit (INDEPENDENT_AMBULATORY_CARE_PROVIDER_SITE_OTHER): Payer: Medicare Other | Admitting: Cardiology

## 2013-06-08 VITALS — BP 129/70 | HR 84 | Ht 75.0 in | Wt 207.0 lb

## 2013-06-08 DIAGNOSIS — R42 Dizziness and giddiness: Secondary | ICD-10-CM

## 2013-06-08 DIAGNOSIS — I4891 Unspecified atrial fibrillation: Secondary | ICD-10-CM

## 2013-06-08 DIAGNOSIS — I951 Orthostatic hypotension: Secondary | ICD-10-CM

## 2013-06-08 DIAGNOSIS — Z79899 Other long term (current) drug therapy: Secondary | ICD-10-CM

## 2013-06-08 DIAGNOSIS — I5032 Chronic diastolic (congestive) heart failure: Secondary | ICD-10-CM

## 2013-06-08 DIAGNOSIS — I480 Paroxysmal atrial fibrillation: Secondary | ICD-10-CM

## 2013-06-08 MED ORDER — FUROSEMIDE 20 MG PO TABS: ORAL_TABLET | ORAL | Status: DC

## 2013-06-08 MED ORDER — MECLIZINE HCL 25 MG PO TABS: 25.0000 mg | ORAL_TABLET | Freq: Three times a day (TID) | ORAL | Status: DC | PRN

## 2013-06-08 NOTE — Assessment & Plan Note (Signed)
The patient has not been aware of any recurrent atrial fibrillation.  Pulse today on exam is normal

## 2013-06-08 NOTE — Patient Instructions (Signed)
INCREASE YOUR LASIX (FUROSEMIDE) TO 1 TO 2 TABLETS DAILY,,  Your physician recommends that you schedule a follow-up appointment in: 3 month ov/bmet

## 2013-06-08 NOTE — Progress Notes (Signed)
Marco Gregory Date of Birth:  03/17/1921 [address] [place], [place]  [address] [phone]         Fax   [phone]  History of Present Illness: This pleasant 79 year old gentleman is seen for a scheduled followup office visit. He has a past history of autonomic dysfunction with orthostatic hypotension. He has a past history of congestive heart failure responsive to Lasix. He has a history of paroxysmal atrial fibrillation. He has a history of multiple falls. Recently Dr. Graciela Husbands prescribed anticoagulants in the form of Eliquis but the patient has refused to take it because of its high cost. At the beginning of the year the patient was very busy running back and forth to check on his wife who was still a patient in the nursing home.  In autumn of 2013 the patient fell and broke 2 ribs and was subsequently admitted to [facility] with another exacerbation of CHF and himself had to be transferred to keep him in place until he was strong enough to return home.  The patient's wife is now back in the nursing home at [place]. The patient had an echocardiogram 03/11/12 showing an ejection fraction of 65-70% with mild aortic stenosis, moderate aortic insufficiency, and mild mitral regurgitation. Pulmonary artery pressure was elevated at 47. Since last visit over the past several weeks the patient has had progressive pedal edema.  He has also been sleeping in a recliner.  His weight is only up 1 pound since last visit.  At the present time he is just taking Lasix 20 mg every other day. Current Outpatient Prescriptions  Medication Sig Dispense Refill  . acetaminophen (TYLENOL) 500 MG tablet Take 1,000 mg by mouth 2 (two) times daily as needed for pain. For pain      . albuterol (PROVENTIL) (2.5 MG/3ML) 0.083% nebulizer solution Take 2.5 mg by nebulization every 6 (six) hours as needed for wheezing.      . digoxin (LANOXIN) 0.125 MG tablet Take 1 tablet (125 mcg total) by  mouth daily.  90 tablet  3  . dorzolamide-timolol (COSOPT) 22.3-6.8 MG/ML ophthalmic solution Place 1 drop into both eyes daily.       . finasteride (PROSCAR) 5 MG tablet Take 5 mg by mouth daily.       . fludrocortisone (FLORINEF) 0.1 MG tablet Take 0.1 mg by mouth daily.      . furosemide (LASIX) 20 MG tablet 1 to 2 tablets daily  60 tablet  5  . HYDROcodone-homatropine (HYCODAN) 5-1.5 MG/5ML syrup TAKE 5 MLS BY MOUTH TWICE A DAY AS NEEDED FOR COUGH  120 mL  0  . levothyroxine (SYNTHROID, LEVOTHROID) 150 MCG tablet TAKE 1 TABLET (150 MCG TOTAL) BY MOUTH DAILY BEFORE BREAKFAST.  30 tablet  1  . Meloxicam (MOBIC PO) Take by mouth daily.      . midodrine (PROAMATINE) 10 MG tablet Take 1 tablet (10 mg total) by mouth daily.  90 tablet  3  . mirabegron ER (MYRBETRIQ) 50 MG TB24 tablet Take 50 mg by mouth daily.      . Multiple Vitamin (MULTIVITAMIN WITH MINERALS) TABS Take 1 tablet by mouth daily.      [name] pyridostigmine (MESTINON) 60 MG tablet Take 60 mg by mouth 3 (three) times daily. Breakfast lunch and dinner      . tamsulosin (FLOMAX) 0.4 MG CAPS Take 0.4 mg by mouth daily.      . meclizine (ANTIVERT) 25 MG tablet Take 1 tablet (  25 mg total) by mouth 3 (three) times daily as needed.  30 tablet  3   No current facility-administered medications for this visit.    No Known Allergies  Patient Active Problem List   Diagnosis Date Noted  . Atrial fibrillation 12/03/2008    Priority: High  . PAF (paroxysmal atrial fibrillation) 08/07/2012  . Long-term (current) use of anticoagulants   . Chronic diastolic heart failure 04/19/2012  . Orthostasis 04/19/2012  . Myasthenia gravis 04/19/2012  . Pancreatic mass 04/19/2012  . COPD exacerbation 03/10/2012  . Overactive bladder 04/05/2011  . DYSAUTONOMIA 12/03/2008  . 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 OBSTRUCTIVE SLEEP APNEA 11/24/2007    History  Smoking status  . Former Smoker -- 1.00 packs/day for 25 years  . Types: Cigarettes  . Quit date:  08/09/1965  Smokeless tobacco  . Never Used    History  Alcohol Use No    Comment: "last alcohol ~ 2008"    Family History  Problem Relation Age of Onset  . Cancer Mother 57    GYN    Review of Systems: Constitutional: no fever chills diaphoresis or fatigue or change in weight.  Head and neck: no hearing loss, no epistaxis, no photophobia or visual disturbance. Respiratory: No cough, shortness of breath or wheezing. Cardiovascular: No chest pain peripheral edema, palpitations. Gastrointestinal: No abdominal distention, no abdominal pain, no change in bowel habits hematochezia or melena. Genitourinary: Positive for nocturia 6-8 times a night Musculoskeletal:No arthralgias, no back pain, no gait disturbance or myalgias. Neurological: No dizziness, no headaches, no numbness, no seizures, no syncope, no weakness, no tremors. Hematologic: No lymphadenopathy, no easy bruising. Psychiatric: No confusion, no hallucinations, no sleep disturbance.    Physical Exam: Filed Vitals:   06/08/13 1352  BP: 129/70  Pulse: 84   the general appearance reveals a well-developed well-nourished gentleman in no distress.The head and neck exam reveals pupils equal and reactive.  Extraocular movements are full.  There is no scleral icterus.  The mouth and pharynx are normal.  The neck is supple.  The carotids reveal no bruits.  The jugular venous pressure is normal.  The  thyroid is not enlarged.  There is no lymphadenopathy.  The chest is clear to percussion and auscultation.  There are no rales or rhonchi.  Expansion of the chest is symmetrical.  The precordium is quiet.  The first heart sound is normal.  The second heart sound is physiologically split.  There is no murmur gallop rub or click.  There is no abnormal lift or heave.  The abdomen is soft and nontender.  The bowel sounds are normal.  The liver and spleen are not enlarged.  There are no abdominal masses.  There are no abdominal bruits.   Extremities reveal good pedal pulses.  There is marked pretibial and pedal edema. There is no cyanosis or clubbing.  Strength is normal and symmetrical in all extremities.  There is no lateralizing weakness.  There are no sensory deficits.  The skin is warm and dry.  There is no rash.  His weight is down 2 pounds since last visit .    Assessment / Plan: Continue same medication except increase furosemide to 20 mg one or 2 tablets daily.  Recheck in 3 months for followup office visit and basal metabolic panel and thyroid function.

## 2013-06-08 NOTE — Assessment & Plan Note (Signed)
His peripheral edema is bilateral and is progressing.  We will increase his furosemide 20 mg tablets up to one or 2 tablets daily when necessary for management of fluid retention.  The patient does have urinary frequency and has chronic nocturia about 8 times a night but this is present whether or not he takes diuretics.

## 2013-06-08 NOTE — Assessment & Plan Note (Signed)
The patient remains on midodrine.  He has not been experiencing any severe recent orthostatic hypotension symptoms

## 2013-06-18 ENCOUNTER — Telehealth: Payer: Self-pay | Admitting: Cardiology

## 2013-06-18 DIAGNOSIS — R609 Edema, unspecified: Secondary | ICD-10-CM

## 2013-06-18 MED ORDER — METOLAZONE 2.5 MG PO TABS: ORAL_TABLET | ORAL | Status: DC

## 2013-06-18 NOTE — Telephone Encounter (Signed)
I reviewed the patient's symptoms with Dr. Eden Emms (DOD). Orders received for zaroxalyn 2.5 mg M,W,F and for follow up and a bmp with Dr. Patty Sermons next week. I have explained this to the patient's daughter. He has already taken his lasix this morning and will be going to a funeral around 11:00 am. I have explained to the patient's daughter that he can start this tomorrow and take it on Thursday and Saturday as well. I have advised of close monitoring of his BP and to be extra cautious when changing position. I have also advised if his symptoms worsen, to please call back. He is scheduled to see Dr. Patty Sermons on 11/18 at 12 pm. BMP will be checked then. The patient's daughter is agreeable.

## 2013-06-18 NOTE — Telephone Encounter (Signed)
I spoke with the patient's daughter. She reports that the patient has been taking 40 mg of lasix over the last 3 days for excessive swelling to his right leg. Swelling is up to the mid-calf. He still has some swelling to the left leg. BP- 129/60's, HR- 86. Weight down 2 lbs from yesterday (199 lbs this morning). His dizziness has been a concern for him lately. He denies SOB. He has no pain to the right leg. His right leg is red. He has had no change in his diet/fluid intake. He has not traveled a long distance in the last week or so. I advised I will review with the DOD and call the patient's daughter back. She is agreeable.

## 2013-06-18 NOTE — Telephone Encounter (Signed)
New Problem  Patient is taking 40mg  of Lasix and it is not helping. His feet are still swelling. Please call daughter and advise.

## 2013-06-20 ENCOUNTER — Ambulatory Visit: Payer: Medicare Other | Admitting: Cardiology

## 2013-06-20 ENCOUNTER — Telehealth: Payer: Self-pay | Admitting: Cardiology

## 2013-06-20 NOTE — Telephone Encounter (Signed)
Spoke with patient's daughter, [name], who states she called on Monday due to patient's legs were very swollen despite the increased Lasix.  Patient was instructed to take Zaroxolyn by Dr. Eden Emms, office DOD.  The patient did not take the medication due to the side effect of dizziness per patient's daughter.  Daughter states that she called back this morning because she knew Dr. Patty Sermons would be in the office today and she would like his advice.  Daughter states the swelling is still evident in both lower extremities, but it is somewhat better than it was on Monday.  Patient denies SOB. BP has been 126/78, HR 78 and 123/70, HR 75.  Patient is currently taking Lasix 40 mg daily.  Daughter is looking through patient's prescriptions to see if patient is on potassium supplement.  Patient states he is eating a banana daily.  I advised patient's daughter that I will talk to Dr. Patty Sermons who is in the office today and will call her back with his advice.  Patient has an appointment 11/18 with Dr. Patty Sermons.  Patient's daughter verbalized agreement and understanding.

## 2013-06-20 NOTE — Telephone Encounter (Signed)
I spoke with patient's daughter who states patient's bilateral lower extremity swelling has decreased some; states patient has voided 35 times today.  I advised daughter that Dr. Patty Sermons does not recommend any medication or treatment changes unless lower extremity swelling increases over the weekend at which time he may double up on his Lasix.  Patient's daughter verbalized understanding and agreement.  I advised her that patient is likely not completely emptying her bladder each of those 35 voids and to encourage patient to continue to drink 64 oz of water daily.

## 2013-06-20 NOTE — Telephone Encounter (Signed)
Left message for patient's daughter to call office for Dr. Yevonne Pax advice

## 2013-06-20 NOTE — Telephone Encounter (Signed)
Follow up ° ° ° ° °Returned nurses call °

## 2013-06-20 NOTE — Telephone Encounter (Signed)
New message     C/o fathers feet are swollen and lasix does not seem to help.  Pls advise.

## 2013-06-26 ENCOUNTER — Encounter: Payer: Self-pay | Admitting: Cardiology

## 2013-06-26 ENCOUNTER — Ambulatory Visit (INDEPENDENT_AMBULATORY_CARE_PROVIDER_SITE_OTHER): Payer: Medicare Other | Admitting: Cardiology

## 2013-06-26 VITALS — BP 102/68 | HR 80 | Ht 75.0 in | Wt 202.8 lb

## 2013-06-26 DIAGNOSIS — I480 Paroxysmal atrial fibrillation: Secondary | ICD-10-CM

## 2013-06-26 DIAGNOSIS — I951 Orthostatic hypotension: Secondary | ICD-10-CM

## 2013-06-26 DIAGNOSIS — I4891 Unspecified atrial fibrillation: Secondary | ICD-10-CM

## 2013-06-26 DIAGNOSIS — L98499 Non-pressure chronic ulcer of skin of other sites with unspecified severity: Secondary | ICD-10-CM

## 2013-06-26 DIAGNOSIS — N390 Urinary tract infection, site not specified: Secondary | ICD-10-CM | POA: Insufficient documentation

## 2013-06-26 NOTE — Assessment & Plan Note (Signed)
The patient has a shallow ulcer on the medial aspect of the right buttock in the intergluteal fold.  This is bleeding.  We will refer him to the wound Center for care of this ulcer

## 2013-06-26 NOTE — Assessment & Plan Note (Signed)
He has been more confused and his daughter is concerned that he may have a urinary tract infection.  We will get urinalysis and culture.

## 2013-06-26 NOTE — Assessment & Plan Note (Signed)
His blood pressure is low today.  He has been on Midodrine10 mg daily and we will increase his dose up to twice a day.

## 2013-06-26 NOTE — Progress Notes (Signed)
Marco Gregory Date of Birth:  [birth year] [address] [place], [place]  [address] [phone]         Fax   [phone]  History of Present Illness: This pleasant 79 year old gentleman is seen for a work in office visit. He has a past history of autonomic dysfunction with orthostatic hypotension. He has a past history of congestive heart failure responsive to Lasix. He has a history of paroxysmal atrial fibrillation. He has a history of multiple falls. Recently Dr. Graciela Husbands prescribed anticoagulants in the form of Eliquis but the patient has refused to take it because of its high cost. At the beginning of the year the patient was very busy running back and forth to check on his wife who was still a patient in the nursing home.  In autumn of 2013 the patient fell and broke 2 ribs and was subsequently admitted to [facility] with another exacerbation of CHF and himself had to be transferred to keep him in place until he was strong enough to return home.  The patient's wife is now back in the nursing home at [place]. The patient had an echocardiogram 03/11/12 showing an ejection fraction of 65-70% with mild aortic stenosis, moderate aortic insufficiency, and mild mitral regurgitation. Pulmonary artery pressure was elevated at 47. Since we last saw him the patient has had several falls.  On one occasion he lost his balance while bending over to pick up his newspaper from his front porch.  He has had worsening of his peripheral edema.  He never did take the Zaroxolyn that we had previously prescribed.  His blood pressure has been running low.  He has had some bleeding from his intergluteal area.  Current Outpatient Prescriptions  Medication Sig Dispense Refill  . dorzolamide-timolol (COSOPT) 22.3-6.8 MG/ML ophthalmic solution Place 1 drop into both eyes daily.       . finasteride (PROSCAR) 5 MG tablet Take 5 mg by mouth daily.       . fludrocortisone (FLORINEF) 0.1 MG  tablet Take 0.1 mg by mouth daily.      . furosemide (LASIX) 20 MG tablet 20 mg 2 (two) times daily.       [name] HYDROcodone-homatropine (HYCODAN) 5-1.5 MG/5ML syrup TAKE 5 MLS BY MOUTH TWICE A DAY AS NEEDED FOR COUGH  120 mL  0  . levothyroxine (SYNTHROID, LEVOTHROID) 150 MCG tablet TAKE 1 TABLET (150 MCG TOTAL) BY MOUTH DAILY BEFORE BREAKFAST.  30 tablet  1  . meclizine (ANTIVERT) 25 MG tablet Take 1 tablet (25 mg total) by mouth 3 (three) times daily as needed.  30 tablet  3  . midodrine (PROAMATINE) 10 MG tablet Take 10 mg by mouth 2 (two) times daily.      . Multiple Vitamin (MULTIVITAMIN WITH MINERALS) TABS Take 1 tablet by mouth daily.      [name] pyridostigmine (MESTINON) 60 MG tablet Take 60 mg by mouth 3 (three) times daily. Breakfast lunch and dinner      . tamsulosin (FLOMAX) 0.4 MG CAPS Take 0.4 mg by mouth daily.      . digoxin (LANOXIN) 0.125 MG tablet Take 1 tablet (125 mcg total) by mouth daily.  90 tablet  3   No current facility-administered medications for this visit.    No Known Allergies  Patient Active Problem List   Diagnosis Date Noted  . Atrial fibrillation 12/03/2008    Priority: High  . Skin ulcer 06/26/2013  . UTI (urinary tract  infection) 06/26/2013  . PAF (paroxysmal atrial fibrillation) 08/07/2012  . Long-term (current) use of anticoagulants   . Chronic diastolic heart failure 04/19/2012  . Orthostasis 04/19/2012  . Myasthenia gravis 04/19/2012  . Pancreatic mass 04/19/2012  . COPD exacerbation 03/10/2012  . Overactive bladder 04/05/2011  . DYSAUTONOMIA 12/03/2008  . 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 OBSTRUCTIVE SLEEP APNEA 11/24/2007    History  Smoking status  . Former Smoker -- 1.00 packs/day for 25 years  . Types: Cigarettes  . Quit date: 08/09/1965  Smokeless tobacco  . Never Used    History  Alcohol Use No    Comment: "last alcohol ~ 2008"    Family History  Problem Relation Age of Onset  . Cancer Mother 56    GYN    Review of  Systems: Constitutional: no fever chills diaphoresis or fatigue or change in weight.  Head and neck: no hearing loss, no epistaxis, no photophobia or visual disturbance. Respiratory: No cough, shortness of breath or wheezing. Cardiovascular: No chest pain peripheral edema, palpitations. Gastrointestinal: No abdominal distention, no abdominal pain, no change in bowel habits hematochezia or melena. Genitourinary: Positive for nocturia 6-8 times a night Musculoskeletal:No arthralgias, no back pain, no gait disturbance or myalgias. Neurological: No dizziness, no headaches, no numbness, no seizures, no syncope, no weakness, no tremors. Hematologic: No lymphadenopathy, no easy bruising. Psychiatric: No confusion, no hallucinations, no sleep disturbance.    Physical Exam: Filed Vitals:   06/26/13 1242  BP: 102/68  Pulse: 80   the general appearance reveals a well-developed well-nourished gentleman in no distress.The head and neck exam reveals pupils equal and reactive.  Extraocular movements are full.  There is no scleral icterus.  The mouth and pharynx are normal.  The neck is supple.  The carotids reveal no bruits.  The jugular venous pressure is normal.  The  thyroid is not enlarged.  There is no lymphadenopathy.  The chest is clear to percussion and auscultation.  There are no rales or rhonchi.  Expansion of the chest is symmetrical.  The precordium is quiet.  The first heart sound is normal.  The second heart sound is physiologically split.  There is no murmur gallop rub or click.  There is no abnormal lift or heave.  The abdomen is soft and nontender.  The bowel sounds are normal.  The liver and spleen are not enlarged.  There are no abdominal masses.  There are no abdominal bruits.  Extremities reveal good pedal pulses.  There is marked pretibial and pedal edema. There is no cyanosis or clubbing.  Strength is normal and symmetrical in all extremities.  There is no lateralizing weakness.  There are  no sensory deficits.  The skin is warm and dry.  There is no rash.  His weight is down 5 pounds since last visit .    Assessment / Plan: Get a urinalysis and culture Increase midodrine up to 10 mg twice a day to support blood pressure. 4 severe peripheral edema keep legs elevated and increase Lasix up to 20 mg twice a day 4 intergluteal fold skin ulcer we will refer him back to the wound clinic Recheck here in one month for followup office visit

## 2013-06-26 NOTE — Patient Instructions (Addendum)
Will obtain labs today and call you with the results (URINALYSIS WITH CULTURE)  INCREASE MIDODRINE 10 MG TWICE A DAY   INCREASE FUROSEMIDE TO 20 MG TWICE A DAY  APPOINTMENT WITH THE WOUND CLINIC 07/02/13 AT 1:30 PM  Your physician recommends that you schedule a follow-up appointment in: 1 MONTH OV

## 2013-06-26 NOTE — Assessment & Plan Note (Signed)
Patient has a history of paroxysmal atrial fibrillation.  He has refused anticoagulation.  He has had multiple falls.

## 2013-06-27 LAB — URINALYSIS
Hgb urine dipstick: NEGATIVE
Ketones, ur: NEGATIVE mg/dL
Nitrite: NEGATIVE
Protein, ur: NEGATIVE mg/dL
pH: 6.5 (ref 5.0–8.0)

## 2013-06-28 ENCOUNTER — Telehealth: Payer: Self-pay | Admitting: Cardiology

## 2013-06-28 NOTE — Telephone Encounter (Signed)
Following up    Pt's daughter called and would like a call back with pt lab results.   Please call [name]

## 2013-06-28 NOTE — Telephone Encounter (Signed)
Advised daughter waiting on culture

## 2013-07-02 ENCOUNTER — Emergency Department (HOSPITAL_COMMUNITY): Payer: Medicare Other

## 2013-07-02 ENCOUNTER — Inpatient Hospital Stay (HOSPITAL_COMMUNITY)
Admission: EM | Admit: 2013-07-02 | Discharge: 2013-07-12 | DRG: 291 | Disposition: A | Discharge status: Skilled Nursing Facility | Payer: Medicare Other | Attending: Family Medicine | Admitting: Family Medicine

## 2013-07-02 ENCOUNTER — Encounter (HOSPITAL_BASED_OUTPATIENT_CLINIC_OR_DEPARTMENT_OTHER): Payer: Medicare Other | Attending: General Surgery

## 2013-07-02 ENCOUNTER — Telehealth: Payer: Self-pay | Admitting: Cardiology

## 2013-07-02 ENCOUNTER — Encounter (HOSPITAL_COMMUNITY): Payer: Self-pay | Admitting: Emergency Medicine

## 2013-07-02 DIAGNOSIS — S98139A Complete traumatic amputation of one unspecified lesser toe, initial encounter: Secondary | ICD-10-CM

## 2013-07-02 DIAGNOSIS — L02419 Cutaneous abscess of limb, unspecified: Secondary | ICD-10-CM | POA: Diagnosis present

## 2013-07-02 DIAGNOSIS — Z9181 History of falling: Secondary | ICD-10-CM | POA: Insufficient documentation

## 2013-07-02 DIAGNOSIS — L98499 Non-pressure chronic ulcer of skin of other sites with unspecified severity: Secondary | ICD-10-CM | POA: Diagnosis present

## 2013-07-02 DIAGNOSIS — L97809 Non-pressure chronic ulcer of other part of unspecified lower leg with unspecified severity: Secondary | ICD-10-CM | POA: Diagnosis present

## 2013-07-02 DIAGNOSIS — H409 Unspecified glaucoma: Secondary | ICD-10-CM | POA: Diagnosis present

## 2013-07-02 DIAGNOSIS — E039 Hypothyroidism, unspecified: Secondary | ICD-10-CM | POA: Diagnosis present

## 2013-07-02 DIAGNOSIS — L89109 Pressure ulcer of unspecified part of back, unspecified stage: Secondary | ICD-10-CM | POA: Diagnosis present

## 2013-07-02 DIAGNOSIS — N189 Chronic kidney disease, unspecified: Secondary | ICD-10-CM | POA: Diagnosis present

## 2013-07-02 DIAGNOSIS — I4891 Unspecified atrial fibrillation: Secondary | ICD-10-CM | POA: Insufficient documentation

## 2013-07-02 DIAGNOSIS — R5381 Other malaise: Secondary | ICD-10-CM

## 2013-07-02 DIAGNOSIS — Z66 Do not resuscitate: Secondary | ICD-10-CM | POA: Diagnosis present

## 2013-07-02 DIAGNOSIS — I10 Essential (primary) hypertension: Secondary | ICD-10-CM | POA: Insufficient documentation

## 2013-07-02 DIAGNOSIS — I951 Orthostatic hypotension: Secondary | ICD-10-CM | POA: Diagnosis present

## 2013-07-02 DIAGNOSIS — Z7401 Bed confinement status: Secondary | ICD-10-CM

## 2013-07-02 DIAGNOSIS — R0602 Shortness of breath: Secondary | ICD-10-CM | POA: Insufficient documentation

## 2013-07-02 DIAGNOSIS — R059 Cough, unspecified: Secondary | ICD-10-CM | POA: Insufficient documentation

## 2013-07-02 DIAGNOSIS — L988 Other specified disorders of the skin and subcutaneous tissue: Secondary | ICD-10-CM | POA: Insufficient documentation

## 2013-07-02 DIAGNOSIS — I509 Heart failure, unspecified: Secondary | ICD-10-CM

## 2013-07-02 DIAGNOSIS — G909 Disorder of the autonomic nervous system, unspecified: Secondary | ICD-10-CM | POA: Diagnosis present

## 2013-07-02 DIAGNOSIS — L89150 Pressure ulcer of sacral region, unstageable: Secondary | ICD-10-CM

## 2013-07-02 DIAGNOSIS — I480 Paroxysmal atrial fibrillation: Secondary | ICD-10-CM | POA: Diagnosis present

## 2013-07-02 DIAGNOSIS — Z23 Encounter for immunization: Secondary | ICD-10-CM

## 2013-07-02 DIAGNOSIS — IMO0001 Reserved for inherently not codable concepts without codable children: Secondary | ICD-10-CM

## 2013-07-02 DIAGNOSIS — J441 Chronic obstructive pulmonary disease with (acute) exacerbation: Secondary | ICD-10-CM

## 2013-07-02 DIAGNOSIS — M79609 Pain in unspecified limb: Secondary | ICD-10-CM

## 2013-07-02 DIAGNOSIS — R627 Adult failure to thrive: Secondary | ICD-10-CM | POA: Diagnosis present

## 2013-07-02 DIAGNOSIS — M25511 Pain in right shoulder: Secondary | ICD-10-CM

## 2013-07-02 DIAGNOSIS — IMO0002 Reserved for concepts with insufficient information to code with codable children: Secondary | ICD-10-CM

## 2013-07-02 DIAGNOSIS — R0989 Other specified symptoms and signs involving the circulatory and respiratory systems: Secondary | ICD-10-CM | POA: Insufficient documentation

## 2013-07-02 DIAGNOSIS — G4733 Obstructive sleep apnea (adult) (pediatric): Secondary | ICD-10-CM

## 2013-07-02 DIAGNOSIS — Z950 Presence of cardiac pacemaker: Secondary | ICD-10-CM

## 2013-07-02 DIAGNOSIS — Z515 Encounter for palliative care: Secondary | ICD-10-CM

## 2013-07-02 DIAGNOSIS — Z7901 Long term (current) use of anticoagulants: Secondary | ICD-10-CM

## 2013-07-02 DIAGNOSIS — I129 Hypertensive chronic kidney disease with stage 1 through stage 4 chronic kidney disease, or unspecified chronic kidney disease: Secondary | ICD-10-CM | POA: Diagnosis present

## 2013-07-02 DIAGNOSIS — W19XXXA Unspecified fall, initial encounter: Secondary | ICD-10-CM

## 2013-07-02 DIAGNOSIS — Z87891 Personal history of nicotine dependence: Secondary | ICD-10-CM

## 2013-07-02 DIAGNOSIS — J449 Chronic obstructive pulmonary disease, unspecified: Secondary | ICD-10-CM | POA: Diagnosis present

## 2013-07-02 DIAGNOSIS — R05 Cough: Secondary | ICD-10-CM | POA: Insufficient documentation

## 2013-07-02 DIAGNOSIS — N3281 Overactive bladder: Secondary | ICD-10-CM

## 2013-07-02 DIAGNOSIS — L97909 Non-pressure chronic ulcer of unspecified part of unspecified lower leg with unspecified severity: Secondary | ICD-10-CM | POA: Diagnosis present

## 2013-07-02 DIAGNOSIS — D692 Other nonthrombocytopenic purpura: Secondary | ICD-10-CM | POA: Diagnosis present

## 2013-07-02 DIAGNOSIS — I5033 Acute on chronic diastolic (congestive) heart failure: Principal | ICD-10-CM

## 2013-07-02 DIAGNOSIS — J4489 Other specified chronic obstructive pulmonary disease: Secondary | ICD-10-CM | POA: Diagnosis present

## 2013-07-02 DIAGNOSIS — R609 Edema, unspecified: Secondary | ICD-10-CM | POA: Insufficient documentation

## 2013-07-02 DIAGNOSIS — Y92009 Unspecified place in unspecified non-institutional (private) residence as the place of occurrence of the external cause: Secondary | ICD-10-CM

## 2013-07-02 DIAGNOSIS — I872 Venous insufficiency (chronic) (peripheral): Secondary | ICD-10-CM | POA: Diagnosis present

## 2013-07-02 DIAGNOSIS — I5032 Chronic diastolic (congestive) heart failure: Secondary | ICD-10-CM

## 2013-07-02 DIAGNOSIS — E875 Hyperkalemia: Secondary | ICD-10-CM | POA: Diagnosis not present

## 2013-07-02 DIAGNOSIS — L89153 Pressure ulcer of sacral region, stage 3: Secondary | ICD-10-CM

## 2013-07-02 DIAGNOSIS — L8993 Pressure ulcer of unspecified site, stage 3: Secondary | ICD-10-CM | POA: Diagnosis present

## 2013-07-02 DIAGNOSIS — Q078 Other specified congenital malformations of nervous system: Secondary | ICD-10-CM

## 2013-07-02 DIAGNOSIS — G7000 Myasthenia gravis without (acute) exacerbation: Secondary | ICD-10-CM | POA: Diagnosis present

## 2013-07-02 DIAGNOSIS — N390 Urinary tract infection, site not specified: Secondary | ICD-10-CM

## 2013-07-02 LAB — CBC WITH DIFFERENTIAL/PLATELET
Basophils Relative: 0 % (ref 0–1)
Eosinophils Absolute: 0.1 10*3/uL (ref 0.0–0.7)
Hemoglobin: 11.4 g/dL — ABNORMAL LOW (ref 13.0–17.0)
MCH: 31.4 pg (ref 26.0–34.0)
MCHC: 34.1 g/dL (ref 30.0–36.0)
Monocytes Absolute: 1 10*3/uL (ref 0.1–1.0)
Monocytes Relative: 13 % — ABNORMAL HIGH (ref 3–12)
Neutrophils Relative %: 74 % (ref 43–77)

## 2013-07-02 LAB — URINALYSIS, ROUTINE W REFLEX MICROSCOPIC
Ketones, ur: NEGATIVE mg/dL
Nitrite: NEGATIVE
Protein, ur: NEGATIVE mg/dL
Urobilinogen, UA: 1 mg/dL (ref 0.0–1.0)

## 2013-07-02 LAB — URINE MICROSCOPIC-ADD ON

## 2013-07-02 LAB — BASIC METABOLIC PANEL
BUN: 17 mg/dL (ref 6–23)
Creatinine, Ser: 0.97 mg/dL (ref 0.50–1.35)
GFR calc Af Amer: 82 mL/min — ABNORMAL LOW (ref >90)
GFR calc non Af Amer: 71 mL/min — ABNORMAL LOW (ref >90)
Potassium: 3.9 mEq/L (ref 3.5–5.1)

## 2013-07-02 MED ORDER — DEXTROSE 5 % IV SOLN
1.0000 g | Freq: Once | INTRAVENOUS | Status: AC
  Administered 2013-07-02: 1 g via INTRAVENOUS
  Filled 2013-07-02: qty 10

## 2013-07-02 MED ORDER — FINASTERIDE 5 MG PO TABS
5.0000 mg | ORAL_TABLET | Freq: Every day | ORAL | Status: DC
  Administered 2013-07-03 – 2013-07-12 (×10): 5 mg via ORAL
  Filled 2013-07-02 (×10): qty 1

## 2013-07-02 MED ORDER — DIGOXIN 125 MCG PO TABS
125.0000 ug | ORAL_TABLET | Freq: Every day | ORAL | Status: DC
Start: 2013-07-03 — End: 2013-07-12
  Administered 2013-07-03 – 2013-07-12 (×10): 125 ug via ORAL
  Filled 2013-07-02 (×10): qty 1

## 2013-07-02 MED ORDER — MIDODRINE HCL 5 MG PO TABS
10.0000 mg | ORAL_TABLET | Freq: Two times a day (BID) | ORAL | Status: DC
  Administered 2013-07-03 – 2013-07-05 (×5): 10 mg via ORAL
  Filled 2013-07-02 (×7): qty 2

## 2013-07-02 MED ORDER — SODIUM CHLORIDE 0.9 % IJ SOLN: 3.0000 mL | INTRAMUSCULAR | Status: DC | PRN

## 2013-07-02 MED ORDER — FLUDROCORTISONE ACETATE 0.1 MG PO TABS: 0.1000 mg | ORAL_TABLET | Freq: Every day | ORAL | Status: DC

## 2013-07-02 MED ORDER — LEVOTHYROXINE SODIUM 150 MCG PO TABS
150.0000 ug | ORAL_TABLET | Freq: Every day | ORAL | Status: DC
  Administered 2013-07-03 – 2013-07-12 (×10): 150 ug via ORAL
  Filled 2013-07-02 (×11): qty 1

## 2013-07-02 MED ORDER — PYRIDOSTIGMINE BROMIDE 60 MG PO TABS
60.0000 mg | ORAL_TABLET | Freq: Three times a day (TID) | ORAL | Status: DC
  Administered 2013-07-03 – 2013-07-12 (×28): 60 mg via ORAL
  Filled 2013-07-02 (×31): qty 1

## 2013-07-02 MED ORDER — DORZOLAMIDE HCL-TIMOLOL MAL 2-0.5 % OP SOLN
1.0000 [drp] | Freq: Every day | OPHTHALMIC | Status: DC
  Administered 2013-07-03 – 2013-07-12 (×10): 1 [drp] via OPHTHALMIC
  Filled 2013-07-02: qty 10

## 2013-07-02 MED ORDER — SODIUM CHLORIDE 0.9 % IJ SOLN
3.0000 mL | Freq: Two times a day (BID) | INTRAMUSCULAR | Status: DC
  Administered 2013-07-02 – 2013-07-12 (×19): 3 mL via INTRAVENOUS

## 2013-07-02 MED ORDER — HEPARIN SODIUM (PORCINE) 5000 UNIT/ML IJ SOLN
5000.0000 [IU] | Freq: Three times a day (TID) | INTRAMUSCULAR | Status: DC
  Administered 2013-07-02 – 2013-07-12 (×30): 5000 [IU] via SUBCUTANEOUS
  Filled 2013-07-02 (×32): qty 1

## 2013-07-02 MED ORDER — TAMSULOSIN HCL 0.4 MG PO CAPS
0.4000 mg | ORAL_CAPSULE | Freq: Every day | ORAL | Status: DC
  Administered 2013-07-03 – 2013-07-06 (×4): 0.4 mg via ORAL
  Filled 2013-07-02 (×4): qty 1

## 2013-07-02 MED ORDER — FUROSEMIDE 10 MG/ML IJ SOLN
40.0000 mg | Freq: Once | INTRAMUSCULAR | Status: AC
  Administered 2013-07-02: 40 mg via INTRAVENOUS
  Filled 2013-07-02: qty 4

## 2013-07-02 MED ORDER — TRAMADOL HCL 50 MG PO TABS
50.0000 mg | ORAL_TABLET | Freq: Two times a day (BID) | ORAL | Status: DC | PRN
  Administered 2013-07-06 – 2013-07-10 (×4): 50 mg via ORAL
  Filled 2013-07-02 (×5): qty 1

## 2013-07-02 NOTE — ED Notes (Signed)
Per Horton, MD, pt given a [place] sandwich.

## 2013-07-02 NOTE — H&P (Signed)
Family Medicine Teaching [facility] Admission History and Physical Service Pager: [phone]  Patient name: Marco Gregory Medical record number: [id number] Date of birth: 1921-01-09 Age: 79 y.o. Gender: male  Primary Care Provider: Elvina Sidle, MD Consultants: None Code Status: DNR  Chief Complaint: Shortness of breath  Assessment and Plan: MIHIR FLANIGAN is a 79 y.o. male with Paroxysmal atrial fibrillation s/p Pacemaker, Diastolic CHF, and COPD presents with worsening SOB and frequent falls.   # Dyspnea - Likely CHF exacerbation given elevated BNP, rales on PE, and LE edema. - Strict I/O's, Echo, Saline Lock IV - Patient was given IV Lasix 40 mg in the ED.  Will hold on further diuresis overnight as BP was trending down and patient has a long history of dysautonomia and low BP. - Will monitor closely. - Will likely need to consult Cardiology in the am to aid in diuresis (especially in the setting of hypotension)  # Falls - Have been worsening recently.  Likely secondary to orthostasis and deconditioning/weakness. - PT/OT consult - Will go ahead and consult SW.  Patient will very likely need SNF placement.  # UTI - UA revealed moderate leukocytes. Patient endorsing some dysuria.  - Cx was obtained. - Patient was given CTX in the ED. - Will continue CTX and narrow after culture results return.   # Paroxysmal atrial fibrillation - Continuing home Digoxin  # LE ulcer and Sacral Decubitus ulcer (Stage 3) - Wound care consult - Considering Vanc for ? LE cellulitis  # Dysautonomia and hypotension - Continuing home Midodrine - Holding Florinef in the setting of diuresis as it increases sodium reabsorption (increasing volume)  # Hypothyroidism - Continuing home synthroid  # ? Myasthenia - This is documented in the medical record but per patient and daughter, they are unaware of this condition. - Will continue [place]. - Will attempt to contact  Neurologist Dr. Sandria Manly who has seen this patient in the past.  FEN/GI: Saline Lock IV; Heart Healthy Diet Prophylaxis: Heparin SQ  Disposition: Pending clinical improvement; Patient will need PT/OT assessment and will likely need placement given frequent falls.   History of Present Illness: Marco Gregory is a 79 y.o. male with a  PMH of Paroxysmal atrial fibrillation s/p Pacemaker, Diastolic CHF, COPD, ? Myasthenia gravis who presents with a 2-3 day history of increasing SOB.   Patient accompanied by daughter.  Over the past 2-3 days, patient has been experiencing worsening SOB and "congestion."  The daughter has noticed that his work of breathing is more labored.  No associated chest pain.  No productive cough, fevers, chills.  Patient has a known history of CHF and is followed by Dr. Patty Sermons, [facility].  He was recently seen on 11/18 and his home lasix was increased to 40 mg daily.   In addition to shortness of breath, the daughter (and patient) report that he has had several falls over the past 10 days.  Falls have been occuring typically when transferring to and from wheelchair.  He has hit his head but has had no LOC.  Additionally, the daughter also expresses concern about his LE ulcer and his sacral ulcer that are being followed by wound care.  Lastly, the daughter reports that he has been intermittently confused over the past few days and she is concerned that he may have a UTI.  Review Of Systems: Per HPI with the following additions: Patient has had dysuria and incontinence.  Otherwise 12 point review of systems was performed and  was unremarkable.  Patient Active Problem List   Diagnosis Date Noted  . Skin ulcer 06/26/2013  . UTI (urinary tract infection) 06/26/2013  . PAF (paroxysmal atrial fibrillation) 08/07/2012  . Long-term (current) use of anticoagulants   . Chronic diastolic heart failure 04/19/2012  . Orthostasis 04/19/2012  . Myasthenia gravis 04/19/2012   . Pancreatic mass 04/19/2012  . COPD exacerbation 03/10/2012  . Overactive bladder 04/05/2011  . Atrial fibrillation 12/03/2008  . DYSAUTONOMIA 12/03/2008  . 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 OBSTRUCTIVE SLEEP APNEA 11/24/2007   Past Medical History: Past Medical History  Diagnosis Date  . Long-term (current) use of anticoagulants   . Glaucoma   . Gout   . Hyperthyroidism   . Orthostatic hypotension   . Redundant prepuce and phimosis   . Acute cystitis   . Hypertonicity of bladder   . HTN (hypertension)   . Atelectasis   . Dysautonomia   . Pacemaker- st [employer]     DOI 2012  . CHF (congestive heart failure)     Previous preserved EF.  [name] Atrial fibrillation   . Sinus node dysfunction   . Pneumonia 08/2011    after hernia OR  . Hypothyroidism   . Sleep apnea     does not use cpap ; last used ~ 2011 (03/10/12)  . Spinal stenosis of lumbar region   . Chronic kidney disease [facility]     UTI  1 MO AGO TX MEDICALLY   . Osteomyelitis of toe of right foot ~ 2009    amputated 2nd toe   Past Surgical History: Past Surgical History  Procedure Laterality Date  . Elbow bursa surgery  04/07/2006    Right elbow septic olecranon bursitis  . Toe amputation  ~ 2009    Osteomyelitis, right foot second toe /  Right foot second ray amputation  . Cardiac catheterization  07/03/2002    EF 60-70% /  Normal left ventricular function. / Very mild trivial three-vessel coronary atherosclerosis. / There is no mitral regurgitation noted  . Tonsillectomy and adenoidectomy      "when I was a youngster"  . Appendectomy      "when I was a youngster"  . Inguinal hernia repair  ~ 2003/13    right  . Cataract extraction w/ intraocular lens  implant, bilateral  01/2005   Social History: History  Substance Use Topics  . Smoking status: Former Smoker -- 1.00 packs/day for 25 years    Types: Cigarettes    Quit date: 08/09/1965  . Smokeless tobacco: Never Used  . Alcohol Use: No     Comment: "last  alcohol ~ 2008"    Family History: Family History  Problem Relation Age of Onset  . Cancer Mother 47    GYN   Allergies and Medications: No Known Allergies No current facility-administered medications on file prior to encounter.   Current Outpatient Prescriptions on File Prior to Encounter  Medication Sig Dispense Refill  . digoxin (LANOXIN) 0.125 MG tablet Take 1 tablet (125 mcg total) by mouth daily.  90 tablet  3  . dorzolamide-timolol (COSOPT) 22.3-6.8 MG/ML ophthalmic solution Place 1 drop into both eyes daily.       . finasteride (PROSCAR) 5 MG tablet Take 5 mg by mouth daily.       . fludrocortisone (FLORINEF) 0.1 MG tablet Take 0.1 mg by mouth daily.      . furosemide (LASIX) 20 MG tablet 20 mg 2 (two) times daily.       [name]  midodrine (PROAMATINE) 10 MG tablet Take 10 mg by mouth 2 (two) times daily.      . Multiple Vitamin (MULTIVITAMIN WITH MINERALS) TABS Take 1 tablet by mouth daily.      [name] pyridostigmine (MESTINON) 60 MG tablet Take 60 mg by mouth 3 (three) times daily. Breakfast lunch and dinner      . tamsulosin (FLOMAX) 0.4 MG CAPS Take 0.4 mg by mouth daily.        Objective: BP 100/85  Pulse 63  Temp(Src) 98 F (36.7 C) (Oral)  Resp 19  Ht 6\' 3"  (1.905 m)  Wt 202 lb (91.627 kg)  BMI 25.25 kg/m2  SpO2 100% Exam: General: chronically ill appearing elderly gentleman.  NAD.  Speaking in complete sentences. HEENT: NCAT.   Cardiovascular: Irregularly, irregular.  2/6 systolic murmur noted.  Respiratory: Bibasilar rales noted.  No wheezing noted on exam.  Abdomen: soft, nontender, nondistended. No palpable organomegaly. Extremities: Lower extremities red and warm to touch.  ~4 cm circular wound noted on the left lateral lower leg; Purpura also noted on left lower leg.  Skin: See above; Additonally, stage 3 sacral decubitus ulcer noted.  Neuro: AO x 3.  No focal deficits on exam.   Labs and Imaging: CBC BMET   Recent Labs Lab 07/02/13 1611  WBC 7.8  HGB  11.4*  HCT 33.4*  PLT 206    Recent Labs Lab 07/02/13 1611  NA 135  K 3.9  CL 96  CO2 28  BUN 17  CREATININE 0.97  GLUCOSE 108*  CALCIUM 9.0     BNP (last 3 results)  Recent Labs  07/02/13 1612  PROBNP 2389.0*   Digoxin level - 0.5.  Urinalysis    Component Value Date/Time   COLORURINE AMBER* 07/02/2013 1739   APPEARANCEUR CLOUDY* 07/02/2013 1739   LABSPEC 1.018 07/02/2013 1739   PHURINE 5.5 07/02/2013 1739   GLUCOSEU NEGATIVE 07/02/2013 1739   HGBUR TRACE* 07/02/2013 1739   BILIRUBINUR SMALL* 07/02/2013 1739   KETONESUR NEGATIVE 07/02/2013 1739   PROTEINUR NEGATIVE 07/02/2013 1739   UROBILINOGEN 1.0 07/02/2013 1739   NITRITE NEGATIVE 07/02/2013 1739   LEUKOCYTESUR MODERATE* 07/02/2013 1739   Dg Chest 2 View 07/02/2013   IMPRESSION: Mild pulmonary vascular congestion without overt edema.  Stable cardiomegaly.    Ct Head Wo Contrast 07/02/2013   IMPRESSION: CT head:  Chronic changes without acute abnormality.  CT of the cervical spine: Degenerative change without acute abnormality.    Ct Cervical Spine Wo Contrast 07/02/2013  IMPRESSION: CT head:  Chronic changes without acute abnormality.  CT of the cervical spine: Degenerative change without acute abnormality.  EKG: RBBB and LAFB, A fib (rate controlled)  Tommie Sams, DO 07/02/2013, 10:17 PM PGY-2, [facility] FPTS Intern pager: [phone], text pages welcome

## 2013-07-02 NOTE — ED Notes (Signed)
Patient transported to CT 

## 2013-07-02 NOTE — ED Notes (Addendum)
Pt went to appt at [facility] center and was found to have both legs more swollen and weeping, pt has hx of chf. Pt pants were wet due to weeping ptar noted. Pt is seen for decubitus ulcer on his sacrum (stage 3). Pt is alert and oriented, nad noted.

## 2013-07-02 NOTE — ED Notes (Signed)
Admitting physician at bedside

## 2013-07-02 NOTE — Telephone Encounter (Signed)
Left message to call back  

## 2013-07-02 NOTE — ED Notes (Signed)
Family at bedside. 

## 2013-07-02 NOTE — ED Notes (Signed)
Dr. Wilkie Aye made aware of pt bp (90/36) and order for 40 mg of lasix was verified. Dr. Wilkie Aye told RN to hold the lasix until she spoke with consulting provider. Will continue to monitor BP.

## 2013-07-02 NOTE — Telephone Encounter (Signed)
New problem    Pt is being taken to the hospital right now.  Daughter need to speak to you about some paper work that came from Dr Patty Sermons.  Please give her a call.

## 2013-07-02 NOTE — ED Provider Notes (Signed)
CSN: [id number]     Arrival date & time 07/02/13  1555 History   First MD Initiated Contact with Patient 07/02/13 1557     Chief Complaint  Patient presents with  . Leg Swelling   (Consider location/radiation/quality/duration/timing/severity/associated sxs/prior Treatment) HPI  This is a 79 year old male with history of CHF, pacemaker, atrial fibrillation, and chronic kidney disease who presents with shortness of breath. Patient reports increasing difficulty breathing over the last several days. He reports orthopnea and dyspnea on exertion. He has had some increase in lower extremity swelling. He has chronic wounds on his bilateral lower extremities and does receive wound care. Patient denies any chest pain.  Patient denies any fevers. He does report a cough that is nonproductive. Patient lives at home with his wife who is currently in rehabilitation.  Past Medical History  Diagnosis Date  . Long-term (current) use of anticoagulants   . Glaucoma   . Gout   . Hyperthyroidism   . Orthostatic hypotension   . Redundant prepuce and phimosis   . Acute cystitis   . Hypertonicity of bladder   . HTN (hypertension)   . Atelectasis   . Dysautonomia   . Pacemaker- [facility] [employer]     DOI 2012  . CHF (congestive heart failure)     Previous preserved EF.  [name] Atrial fibrillation   . Sinus node dysfunction   . Pneumonia 08/2011    after hernia OR  . Hypothyroidism   . Sleep apnea     does not use cpap ; last used ~ 2011 (03/10/12)  . Spinal stenosis of lumbar region   . Chronic kidney disease [facility]     UTI  1 MO AGO TX MEDICALLY   . Osteomyelitis of toe of right foot ~ 2009    amputated 2nd toe   Past Surgical History  Procedure Laterality Date  . Elbow bursa surgery  04/07/2006    Right elbow septic olecranon bursitis  . Toe amputation  ~ 2009    Osteomyelitis, right foot second toe /  Right foot second ray amputation  . Cardiac catheterization  07/03/2002    EF 60-70% /  Normal  left ventricular function. / Very mild trivial three-vessel coronary atherosclerosis. / There is no mitral regurgitation noted  . Tonsillectomy and adenoidectomy      "when I was a youngster"  . Appendectomy      "when I was a youngster"  . Inguinal hernia repair  ~ 2003/13    right  . Cataract extraction w/ intraocular lens  implant, bilateral  01/2005   Family History  Problem Relation Age of Onset  . Cancer Mother 87    GYN   History  Substance Use Topics  . Smoking status: Former Smoker -- 1.00 packs/day for 25 years    Types: Cigarettes    Quit date: 08/09/1965  . Smokeless tobacco: Never Used  . Alcohol Use: No     Comment: "last alcohol ~ 2008"    Review of Systems  Constitutional: Negative.  Negative for fever.  Respiratory: Positive for shortness of breath. Negative for chest tightness.   Cardiovascular: Positive for leg swelling. Negative for chest pain.  Gastrointestinal: Negative.  Negative for abdominal pain.  Genitourinary: Negative.  Negative for dysuria.  Musculoskeletal: Negative for back pain.  Skin: Positive for color change and wound. Negative for rash.  Neurological: Negative for headaches.  All other systems reviewed and are negative.    Allergies  Review of patient's allergies indicates  no known allergies.  Home Medications   No current outpatient prescriptions on file. BP 111/60  Pulse 69  Temp(Src) 98.3 F (36.8 C) (Oral)  Resp 19  Ht 6\' 3"  (1.905 m)  Wt 195 lb 1.7 oz (88.5 kg)  BMI 24.39 kg/m2  SpO2 98% Physical Exam  Nursing note and vitals reviewed. Constitutional: He is oriented to person, place, and time. No distress.  Elderly, no acute distress  HENT:  Head: Normocephalic and atraumatic.  Mouth/Throat: Oropharynx is clear and moist.  Eyes: Pupils are equal, round, and reactive to light.  Neck: Neck supple.  Cardiovascular: Normal rate, regular rhythm and normal heart sounds.   No murmur heard. Pulmonary/Chest: Effort normal  and breath sounds normal. No respiratory distress. He has no wheezes.  Final crackles in the bases  Abdominal: Soft. Bowel sounds are normal. There is no tenderness. There is no rebound.  Musculoskeletal: He exhibits edema.  1+ bilateral lower extremity edema  Lymphadenopathy:    He has no cervical adenopathy.  Neurological: He is alert and oriented to person, place, and time.  Skin: Skin is warm and dry.  Diffuse erythema with proper over the left lower extremity below the knee, there is evidence of a skin tear over the lateral aspect of the calf.  Psychiatric: He has a normal mood and affect.    ED Course  Procedures (including critical care time) Labs Review Labs Reviewed  CBC WITH DIFFERENTIAL - Abnormal; Notable for the following:    RBC 3.63 (*)    Hemoglobin 11.4 (*)    HCT 33.4 (*)    Monocytes Relative 13 (*)    All other components within normal limits  BASIC METABOLIC PANEL - Abnormal; Notable for the following:    Glucose, Bld 108 (*)    GFR calc non Af Amer 71 (*)    GFR calc Af Amer 82 (*)    All other components within normal limits  URINALYSIS, ROUTINE W REFLEX MICROSCOPIC - Abnormal; Notable for the following:    Color, Urine AMBER (*)    APPearance CLOUDY (*)    Hgb urine dipstick TRACE (*)    Bilirubin Urine SMALL (*)    Leukocytes, UA MODERATE (*)    All other components within normal limits  PRO B NATRIURETIC PEPTIDE - Abnormal; Notable for the following:    Pro B Natriuretic peptide (BNP) 2389.0 (*)    All other components within normal limits  DIGOXIN LEVEL - Abnormal; Notable for the following:    Digoxin Level 0.5 (*)    All other components within normal limits  URINE MICROSCOPIC-ADD ON - Abnormal; Notable for the following:    Bacteria, UA FEW (*)    All other components within normal limits  URINE CULTURE  CBC  CREATININE, SERUM  COMPREHENSIVE METABOLIC PANEL  CBC  POCT I-STAT TROPONIN I   Imaging Review Dg Chest 2 View  07/02/2013    CLINICAL DATA:  Leg swelling, history of CHF  EXAM: CHEST  2 VIEW  COMPARISON:  Prior chest x-ray 06/07/2012  FINDINGS: Stable cardiomegaly. Atherosclerotic calcifications noted in the transverse aorta. Left subclavian approach cardiac rhythm maintenance device with leads projecting over the right atrium and right ventricle. Mild pulmonary vascular congestion, slightly progressed from prior but without overt edema. Elevation of left hemidiaphragm with mild lingular atelectasis. No pneumothorax or large pleural effusion. No acute osseous abnormality. Central bronchitic changes are similar compared to prior.  IMPRESSION: Mild pulmonary vascular congestion without overt edema.  Stable  cardiomegaly.   Electronically Signed   By: Malachy Moan M.D.   On: 07/02/2013 17:22   Ct Head Wo Contrast  07/02/2013   CLINICAL DATA:  Left-sided neck pain, recent falls  EXAM: CT HEAD WITHOUT CONTRAST  CT CERVICAL SPINE WITHOUT CONTRAST  TECHNIQUE: Multidetector CT imaging of the head and cervical spine was performed following the standard protocol without intravenous contrast. Multiplanar CT image reconstructions of the cervical spine were also generated.  COMPARISON:  04/19/2012  FINDINGS: CT HEAD FINDINGS  Mild soft tissue changes are noted within the left maxillary antrum likely of a chronic nature. The bony calvarium is intact. Atrophic changes are identified as well as chronic white matter ischemic change. No findings to suggest acute hemorrhage, acute infarction or space-occupying mass lesion are noted.  CT CERVICAL SPINE FINDINGS  Seven cervical segments are well visualized. Vertebral body height is well maintained. Multilevel facet hypertrophic changes are noted. No acute fracture or acute facet abnormality is noted. The surrounding soft tissues are within normal limits with the exception of carotid calcifications.  IMPRESSION: CT head:  Chronic changes without acute abnormality.  CT of the cervical spine:  Degenerative change without acute abnormality.   Electronically Signed   By: Alcide Clever M.D.   On: 07/02/2013 19:07   Ct Cervical Spine Wo Contrast  07/02/2013   CLINICAL DATA:  Left-sided neck pain, recent falls  EXAM: CT HEAD WITHOUT CONTRAST  CT CERVICAL SPINE WITHOUT CONTRAST  TECHNIQUE: Multidetector CT imaging of the head and cervical spine was performed following the standard protocol without intravenous contrast. Multiplanar CT image reconstructions of the cervical spine were also generated.  COMPARISON:  04/19/2012  FINDINGS: CT HEAD FINDINGS  Mild soft tissue changes are noted within the left maxillary antrum likely of a chronic nature. The bony calvarium is intact. Atrophic changes are identified as well as chronic white matter ischemic change. No findings to suggest acute hemorrhage, acute infarction or space-occupying mass lesion are noted.  CT CERVICAL SPINE FINDINGS  Seven cervical segments are well visualized. Vertebral body height is well maintained. Multilevel facet hypertrophic changes are noted. No acute fracture or acute facet abnormality is noted. The surrounding soft tissues are within normal limits with the exception of carotid calcifications.  IMPRESSION: CT head:  Chronic changes without acute abnormality.  CT of the cervical spine: Degenerative change without acute abnormality.   Electronically Signed   By: Alcide Clever M.D.   On: 07/02/2013 19:07    EKG Interpretation    Date/Time:  Monday July 02 2013 16:03:47 EST Ventricular Rate:  89 PR Interval:    QRS Duration: 122 QT Interval:  423 QTC Calculation: 515 R Axis:   -62 Text Interpretation:  Atrial fibrillation RBBB and LAFB No significant change since last tracing Confirmed by Kareema Keitt  MD, Lempi Edwin ([id number]) on 07/02/2013 4:26:27 PM            MDM   1. CHF (congestive heart failure)   2. Urinary tract infection   3. Fall at home, initial encounter    Patient presents with chief complaint of shortness  of breath. Upon initial evaluation, he urinated on himself. Urine is very malodorous.  Basic labwork was sent. EKG is nonischemic. Troponin is negative. BNP is elevated. Patient was given his daily dose of Lasix IV.  Urinalysis shows evidence of bacteria and white cells. Patient was given IV Rocephin.  5:58 PM  Patient's family is at the bedside. They report multiple falls over the last 2  weeks. They report patient falling and hitting his head without loss of consciousness.  CT scan was ordered.  CT scan negative for acute injury.  Patient will be admitted to the family practice service for acute CHF exacerbation and urinary tract infection.   Shon Baton, MD 07/02/13 [phone]

## 2013-07-03 ENCOUNTER — Encounter (HOSPITAL_COMMUNITY): Payer: Self-pay | Admitting: General Practice

## 2013-07-03 ENCOUNTER — Telehealth: Payer: Self-pay

## 2013-07-03 DIAGNOSIS — I369 Nonrheumatic tricuspid valve disorder, unspecified: Secondary | ICD-10-CM

## 2013-07-03 DIAGNOSIS — N318 Other neuromuscular dysfunction of bladder: Secondary | ICD-10-CM

## 2013-07-03 DIAGNOSIS — W19XXXA Unspecified fall, initial encounter: Secondary | ICD-10-CM

## 2013-07-03 DIAGNOSIS — J441 Chronic obstructive pulmonary disease with (acute) exacerbation: Secondary | ICD-10-CM

## 2013-07-03 DIAGNOSIS — L89153 Pressure ulcer of sacral region, stage 3: Secondary | ICD-10-CM

## 2013-07-03 DIAGNOSIS — I951 Orthostatic hypotension: Secondary | ICD-10-CM

## 2013-07-03 DIAGNOSIS — Y92009 Unspecified place in unspecified non-institutional (private) residence as the place of occurrence of the external cause: Secondary | ICD-10-CM

## 2013-07-03 DIAGNOSIS — I959 Hypotension, unspecified: Secondary | ICD-10-CM

## 2013-07-03 DIAGNOSIS — G7000 Myasthenia gravis without (acute) exacerbation: Secondary | ICD-10-CM

## 2013-07-03 DIAGNOSIS — Z950 Presence of cardiac pacemaker: Secondary | ICD-10-CM

## 2013-07-03 DIAGNOSIS — I509 Heart failure, unspecified: Secondary | ICD-10-CM

## 2013-07-03 LAB — CBC
HCT: 33.5 % — ABNORMAL LOW (ref 39.0–52.0)
HCT: 34.4 % — ABNORMAL LOW (ref 39.0–52.0)
Hemoglobin: 11.1 g/dL — ABNORMAL LOW (ref 13.0–17.0)
Hemoglobin: 11.3 g/dL — ABNORMAL LOW (ref 13.0–17.0)
MCH: 31.4 pg (ref 26.0–34.0)
MCH: 31.1 pg (ref 26.0–34.0)
MCHC: 33.1 g/dL (ref 30.0–36.0)
MCHC: 32.8 g/dL (ref 30.0–36.0)
MCV: 94.6 fL (ref 78.0–100.0)
MCV: 94.8 fL (ref 78.0–100.0)
Platelets: 211 10*3/uL (ref 150–400)
Platelets: 224 10*3/uL (ref 150–400)
RBC: 3.54 MIL/uL — ABNORMAL LOW (ref 4.22–5.81)
RBC: 3.63 MIL/uL — ABNORMAL LOW (ref 4.22–5.81)
RDW: 13.5 % (ref 11.5–15.5)
RDW: 13.6 % (ref 11.5–15.5)
WBC: 7.3 10*3/uL (ref 4.0–10.5)
WBC: 7.8 10*3/uL (ref 4.0–10.5)

## 2013-07-03 LAB — COMPREHENSIVE METABOLIC PANEL
ALT: 14 U/L (ref 0–53)
AST: 22 U/L (ref 0–37)
Albumin: 2.7 g/dL — ABNORMAL LOW (ref 3.5–5.2)
Alkaline Phosphatase: 84 U/L (ref 39–117)
BUN: 15 mg/dL (ref 6–23)
CO2: 31 mEq/L (ref 19–32)
Calcium: 8.7 mg/dL (ref 8.4–10.5)
Chloride: 98 mEq/L (ref 96–112)
Creatinine, Ser: 1.07 mg/dL (ref 0.50–1.35)
GFR calc Af Amer: 68 mL/min — ABNORMAL LOW (ref >90)
GFR calc non Af Amer: 59 mL/min — ABNORMAL LOW (ref >90)
Glucose, Bld: 84 mg/dL (ref 70–99)
Potassium: 4.1 mEq/L (ref 3.5–5.1)
Sodium: 138 mEq/L (ref 135–145)
Total Bilirubin: 0.9 mg/dL (ref 0.3–1.2)
Total Protein: 6.4 g/dL (ref 6.0–8.3)

## 2013-07-03 LAB — URINE CULTURE
Colony Count: NO GROWTH
Culture: NO GROWTH

## 2013-07-03 LAB — CREATININE, SERUM
Creatinine, Ser: 1.04 mg/dL (ref 0.50–1.35)
GFR calc Af Amer: 71 mL/min — ABNORMAL LOW (ref >90)
GFR calc non Af Amer: 61 mL/min — ABNORMAL LOW (ref >90)

## 2013-07-03 LAB — MRSA PCR SCREENING: MRSA by PCR: POSITIVE — AB

## 2013-07-03 MED ORDER — FUROSEMIDE 10 MG/ML IJ SOLN
INTRAMUSCULAR | Status: AC
  Filled 2013-07-03: qty 4

## 2013-07-03 MED ORDER — SULFAMETHOXAZOLE-TMP DS 800-160 MG PO TABS
1.0000 | ORAL_TABLET | Freq: Two times a day (BID) | ORAL | Status: DC
  Administered 2013-07-03 – 2013-07-10 (×15): 1 via ORAL
  Filled 2013-07-03 (×16): qty 1

## 2013-07-03 MED ORDER — SULFAMETHOXAZOLE-TMP DS 800-160 MG PO TABS
1.0000 | ORAL_TABLET | Freq: Two times a day (BID) | ORAL | Status: DC
Start: 2013-07-03 — End: 2013-07-03
  Filled 2013-07-03 (×3): qty 1

## 2013-07-03 MED ORDER — CHLORHEXIDINE GLUCONATE CLOTH 2 % EX PADS
6.0000 | MEDICATED_PAD | Freq: Every day | CUTANEOUS | Status: AC
  Administered 2013-07-03 – 2013-07-07 (×5): 6 via TOPICAL

## 2013-07-03 MED ORDER — FUROSEMIDE 10 MG/ML IJ SOLN
40.0000 mg | Freq: Once | INTRAMUSCULAR | Status: AC
  Administered 2013-07-03: 40 mg via INTRAVENOUS

## 2013-07-03 MED ORDER — COLLAGENASE 250 UNIT/GM EX OINT
TOPICAL_OINTMENT | Freq: Every day | CUTANEOUS | Status: DC
  Administered 2013-07-03 – 2013-07-12 (×10): via TOPICAL
  Filled 2013-07-03 (×2): qty 30

## 2013-07-03 MED ORDER — MUPIROCIN 2 % EX OINT
1.0000 "application " | TOPICAL_OINTMENT | Freq: Two times a day (BID) | CUTANEOUS | Status: AC
  Administered 2013-07-03 – 2013-07-07 (×10): 1 via NASAL
  Filled 2013-07-03: qty 22

## 2013-07-03 MED ORDER — HYDROCODONE-ACETAMINOPHEN 5-325 MG PO TABS
1.0000 | ORAL_TABLET | Freq: Four times a day (QID) | ORAL | Status: DC | PRN
  Administered 2013-07-03 – 2013-07-04 (×2): 1 via ORAL
  Filled 2013-07-03 (×2): qty 1

## 2013-07-03 MED ORDER — INFLUENZA VAC SPLIT QUAD 0.5 ML IM SUSP
0.5000 mL | INTRAMUSCULAR | Status: AC
  Administered 2013-07-04: 0.5 mL via INTRAMUSCULAR
  Filled 2013-07-03: qty 0.5

## 2013-07-03 NOTE — Progress Notes (Signed)
The patient arrived to 4E02 at 2230 from the ED.  The patient was oriented to the unit and placed on telemetry.  He is V-Paced on the monitor.  VS were taken and the patient was assessed.  He is A&Ox4 and does not have any complaints of pain nor signs of distress.  The patient has several wounds on him, which are documented in the chart.  A condom cath was placed on the patient.  The patient is being r/o for MRSA and is on the proper precautions.  The call bell was placed within reach and the bed alarm was turned on.

## 2013-07-03 NOTE — Evaluation (Signed)
Occupational Therapy Evaluation Patient Details Name: Marco Gregory MRN: [id number] DOB: 1920-08-22 Today's Date: 07/03/2013 Time: 4098-1191 OT Time Calculation (min): 17 min  OT Assessment / Plan / Recommendation History of present illness Marco Gregory is a 79 y.o. male with Paroxysmal atrial fibrillation s/p Pacemaker, Diastolic CHF, and COPD presents with worsening SOB and frequent falls.   Clinical Impression   Pt admitted with above.  Will benefit from continued acute OT services to address below problem list.  Pt providing very little information during eval regarding PLOF and level of assist available at home.  Pt only able to tolerate tasks sitting EOB this session due to back pain.  Recommending SNF for d/c planning to progress rehab before eventual return home.    OT Assessment  Patient needs continued OT Services    Follow Up Recommendations  SNF;Supervision/Assistance - 24 hour    Barriers to Discharge Decreased caregiver support Pt living alone since wife has been in rehab.  Does not state whether family can provide assist.  Equipment Recommendations   (TBD next venue)    Recommendations for Other Services    Frequency  Min 2X/week    Precautions / Restrictions Precautions Precautions: Fall   Pertinent Vitals/Pain C/o of back pain (did not specify on 1-10 scale).  RN made aware.    ADL  Eating/Feeding: Performed;Set up Where Assessed - Eating/Feeding: Edge of bed Grooming: Performed;Wash/dry hands;Wash/dry face;Set up Where Assessed - Grooming: Unsupported sitting Upper Body Bathing: Simulated;Supervision/safety Where Assessed - Upper Body Bathing: Unsupported sitting Lower Body Bathing: Simulated;Moderate assistance Where Assessed - Lower Body Bathing: Unsupported sitting;Lean right and/or left Upper Body Dressing: Performed;Minimal assistance Where Assessed - Upper Body Dressing: Unsupported sitting Lower Body Dressing: Simulated;Maximal  assistance Where Assessed - Lower Body Dressing: Unsupported sitting;Lean right and/or left Transfers/Ambulation Related to ADLs: Pt refusing to get OOB due to back pain. ADL Comments: Pt sat EOB for breakfast and grooming tasks.  Encouraged pt to attempt OOB mobility and to sit in chair, but pt adamantly refusing, stating that his back pain is too bad for him to get up.    OT Diagnosis: Generalized weakness;Acute pain  OT Problem List: Decreased strength;Decreased activity tolerance;Decreased knowledge of use of DME or AE;Pain OT Treatment Interventions: Self-care/ADL training;DME and/or AE instruction;Therapeutic activities;Patient/family education   OT Goals(Current goals can be found in the care plan section) Acute Rehab OT Goals Patient Stated Goal: to feel better OT Goal Formulation: With patient Time For Goal Achievement: 07/17/13 Potential to Achieve Goals: Good  Visit Information  Last OT Received On: 07/03/13 Assistance Needed: +1 (at bed level) History of Present Illness: Marco Gregory is a 79 y.o. male with Paroxysmal atrial fibrillation s/p Pacemaker, Diastolic CHF, and COPD presents with worsening SOB and frequent falls.       Prior Functioning     Home Living Family/patient expects to be discharged to:: Private residence Living Arrangements: Alone Type of Home: House Additional Comments: Pt states his wife has been at rehab for several weeks.  Has family living near by but would not specify whether they would be able to provide assist.  Pt providing very limited information. Prior Function Level of Independence: Independent (per pt report)         Vision/Perception     Cognition  Cognition Arousal/Alertness: Awake/alert Behavior During Therapy: WFL for tasks assessed/performed (internally distracted by back pain) Overall Cognitive Status: No family/caregiver present to determine baseline cognitive functioning    Extremity/Trunk Assessment Upper  Extremity Assessment Upper Extremity Assessment: Generalized weakness     Mobility Bed Mobility Bed Mobility: Supine to Sit;Sitting - Scoot to Edge of Bed;Sit to Supine Supine to Sit: 3: Mod assist Sitting - Scoot to Edge of Bed: 4: Min assist Sit to Supine: 3: Mod assist;HOB elevated Details for Bed Mobility Assistance: Assist to support trunk OOB and to transition LEs in/out of bed. Incr time due to pain.     Exercise     Balance     End of Session OT - End of Session Activity Tolerance: Patient limited by pain Patient left: in bed;with bed alarm set;with call bell/phone within reach Nurse Communication: Patient requests pain meds  GO   07/03/2013 Cipriano Mile OTR/L Pager [phone] Office [phone]    Cipriano Mile 07/03/2013, 9:53 AM

## 2013-07-03 NOTE — Progress Notes (Signed)
FMTS Attending Note  The plan of care was discussed with the resident team. I agree with the assessment and plan as documented by the resident.   1. CHF exacerbation - diuresis, recheck ECHO, appreciate cardiology input especially in context of hypotension 2. Debility/Falls - agree with PT consult 3. UTI - follow urine culture 4. Afib - continue home Digoxin, appreciate cardiology recs on Digoxin dose 5. LE ulcer and Decubitus Ulcer - appreciate wound care recommendations, local wound care, discuss goals of care with patient/family, consider surgery consultation for possible debridement of sacral wound if consistent with patient wishes  Donnella Sham MD

## 2013-07-03 NOTE — Progress Notes (Signed)
Family Medicine Teaching Service Daily Progress Note Intern Pager: [phone]  Patient name: Marco Gregory Medical record number: [id number] Date of birth: Sep 04, 1920 Age: 79 y.o. Gender: male  Primary Care Provider: Elvina Sidle, MD Consultants: HF team Code Status: Full  Pt Overview and Major Events to Date:   Assessment and Plan: Marco Gregory is a 79 y.o. male with Paroxysmal atrial fibrillation s/p Pacemaker, Diastolic CHF, and COPD presents with worsening SOB and frequent falls.   # Dyspnea - Likely CHF exacerbation given elevated BNP (2389), rales on PE, and LE edema.  - Strict I/O's: OUP - 850cc (0.6cc/kg/hr); Wt 195 down 7 lbs (202 admit)  Patient was given IV Lasix 40 mg in the ED. Will hold on further diuresis overnight as BP was trending down and patient has a long history of dysautonomia and low BP.  - Echo: today - HF Team: Consulted to aid in diuresis (especially in the setting of hypotension)   # Falls - Have been worsening recently. Likely secondary to orthostasis and deconditioning/weakness.  - PT/OT consult  - SW consulted: Patient will very likely need SNF placement.   # UTI - UA revealed moderate leukocytes. Patient endorsing some dysuria. Patient was given CTX in the ED.  - Cx: pending - No dysuria: will hold off off additional abx at this time  # Paroxysmal atrial fibrillation  - Dig level subtheraputic on admit: 0.5 - Currently in Afib w/o RVR - Continuing home Digoxin   # LE ulcer and Sacral Decubitus ulcer (Stage 3)  - Wound care consulted  - Considering Abx for LE ulcer/cellulitis  - Surgery consult for Sacral Ulcer debridement  # Dysautonomia and hypotension  - Continuing home Midodrine  - Holding Florinef in the setting of diuresis as it increases sodium reabsorption (increasing volume)   # Hypothyroidism  - Continuing home synthroid   # ? Myasthenia  - This is documented in the medical record but per patient and daughter,  they are unaware of this condition.  - Will continue Mesthinon.  - Will attempt to contact Neurologist Dr. Sandria Manly who has seen this patient in the past.   FEN/GI: Saline Lock IV; Heart Healthy Diet  Prophylaxis: Heparin SQ   Disposition: Pending clinical improvement; Patient will need PT/OT assessment and will likely need placement given frequent falls.   Subjective: Reports feeling better today: breathing is easier and pain in his neck and back has improved. Denies dysuria.   Objective: Temp:  [98 F (36.7 C)-98.3 F (36.8 C)] 98.1 F (36.7 C) (11/25 0536) Pulse Rate:  [58-84] 67 (11/25 0536) Resp:  [16-22] 17 (11/25 0536) BP: (93-117)/(46-85) 103/52 mmHg (11/25 0536) SpO2:  [96 %-100 %] 96 % (11/25 0536) Weight:  [195 lb 1.7 oz (88.5 kg)-202 lb (91.627 kg)] 195 lb 1.7 oz (88.5 kg) (11/25 0221) Physical Exam: General: chronically ill appearing elderly gentleman. NAD. Speaking in complete sentences.  Cardiovascular: Irregularly, irregular. 2/6 systolic murmur noted.  Respiratory: Bibasilar rales noted. No wheezing noted on exam.  Abdomen: soft, nontender, nondistended. No palpable organomegaly.  Extremities: Lower extremities red and warm to touch. ~4 cm circular wound noted on the left lateral lower leg; Purpura also noted on left lower leg.  Neuro: AO x 3.    Laboratory:  Recent Labs Lab 07/02/13 0038 07/02/13 1611  WBC 7.3 7.8  HGB 11.1* 11.4*  HCT 33.5* 33.4*  PLT 211 206    Recent Labs Lab 07/02/13 0038 07/02/13 1611  NA  --  135  K  --  3.9  CL  --  96  CO2  --  28  BUN  --  17  CREATININE 1.04 0.97  CALCIUM  --  9.0  GLUCOSE  --  108*   Imaging/Diagnostic Tests: Dg Chest 2 View  07/02/2013 IMPRESSION: Mild pulmonary vascular congestion without overt edema. Stable cardiomegaly.   Ct Head Wo Contrast  07/02/2013 IMPRESSION: CT head: Chronic changes without acute abnormality. CT of the cervical spine: Degenerative change without acute abnormality.   Ct  Cervical Spine Wo Contrast  07/02/2013 IMPRESSION: CT head: Chronic changes without acute abnormality. CT of the cervical spine: Degenerative change without acute abnormality.   EKG: RBBB and LAFB, A fib (rate controlled)  Wenda Low, MD 07/03/2013, 8:28 AM PGY-1, [facility] Family Medicine FPTS Intern pager: [phone], text pages welcome

## 2013-07-03 NOTE — Consult Note (Signed)
CARDIOLOGY CONSULT NOTE  Patient ID: KEVON TENCH, MRN: [id number], DOB/AGE: 12/11/1920 79 y.o. Admit date: 07/02/2013 Date of Consult: 07/03/2013  Primary Physician: Elvina Sidle, MD Primary Cardiologist: Patty Sermons  Referring Physician: [facility] Medicine Teaching Service  Chief Complaint: Increased SOB Reason for Consultation: help with diuresis for CHF exacerbation  HPI: 79 y.o. male w/ PMHx significant for paroxysmal atrial fibrillation s/p Pacemaker, diastolic CHF, dysautonomia and hypotension, and COPD presented to [facility] on 07/02/2013 with complaints of increase SOB and frequent falls.  This admission he presented with rales on lung ascultation, peripheral edema, and an increased proBNP >2000. He takes 20mg  po Lasix BID at home, and was given 40IV in the ED. Per medical records, diuresis for this pt has been tricky given his autonomic dysfunction and hypotension. He is down 12lbs from 06/08/13.   Pt was admitted 04/2012 after a fall and was found to be volume overloaded. His proBNP at that time was >2000. He is followed by Dr. Patty Sermons for his autonomic dysfunction, CHF, and afib. Per chart review, the pt takes 20mg  Lasix BID and seems able to tolerate this with his autonomic dysfunction without dizziness. His last TTE was 03/2012 which showed EF 65-70% with vigorous systolic function, mild LVH, no wall motion abnormalities, and elevated PA pressure of 47. He does take meclizine and fludrocortisone for his autonomic dysfunction and hypotension. He is also on Digoxin for his afib and has a pacemaker that was placed in 2012. He was on Coumadin which was stopped about 1 year ago per his daughter for some rectal bleeding and due to his increased falls.  He recently saw Dr. Patty Sermons on 06/26/13. Per his note, the pt had been prescribed metolazone, but never took the medication. He was hypotensive and his midodrine was increased to 10mg  BID while his Lasix was increased  to 20mg  BID.    Past Medical History  Diagnosis Date  . Long-term (current) use of anticoagulants   . Glaucoma   . Gout   . Hyperthyroidism   . Orthostatic hypotension   . Redundant prepuce and phimosis   . Acute cystitis   . Hypertonicity of bladder   . HTN (hypertension)   . Atelectasis   . Dysautonomia   . Pacemaker- [facility] [employer]     DOI 2012  . CHF (congestive heart failure)     Previous preserved EF.  [name] Atrial fibrillation   . Sinus node dysfunction   . Pneumonia 08/2011    after hernia OR  . Hypothyroidism   . Sleep apnea     does not use cpap ; last used ~ 2011 (03/10/12)  . Spinal stenosis of lumbar region   . Chronic kidney disease [facility]     UTI  1 MO AGO TX MEDICALLY   . Osteomyelitis of toe of right foot ~ 2009    amputated 2nd toe      Surgical History:  Past Surgical History  Procedure Laterality Date  . Elbow bursa surgery  04/07/2006    Right elbow septic olecranon bursitis  . Toe amputation  ~ 2009    Osteomyelitis, right foot second toe /  Right foot second ray amputation  . Cardiac catheterization  07/03/2002    EF 60-70% /  Normal left ventricular function. / Very mild trivial three-vessel coronary atherosclerosis. / There is no mitral regurgitation noted  . Tonsillectomy and adenoidectomy      "when I was a youngster"  . Appendectomy      "  when I was a youngster"  . Inguinal hernia repair  ~ 2003/13    right  . Cataract extraction w/ intraocular lens  implant, bilateral  01/2005     Home Meds: Prior to Admission medications   Medication Sig Start Date End Date Taking? Authorizing Provider  digoxin (LANOXIN) 0.125 MG tablet Take 1 tablet (125 mcg total) by mouth daily. 11/20/12  Yes Cassell Clement, MD  dorzolamide-timolol (COSOPT) 22.3-6.8 MG/ML ophthalmic solution Place 1 drop into both eyes daily.    Yes Historical Provider, MD  finasteride (PROSCAR) 5 MG tablet Take 5 mg by mouth daily.    Yes Historical Provider, MD  fludrocortisone  (FLORINEF) 0.1 MG tablet Take 0.1 mg by mouth daily.   Yes Historical Provider, MD  furosemide (LASIX) 20 MG tablet 20 mg 2 (two) times daily.  06/08/13  Yes Cassell Clement, MD  levothyroxine (SYNTHROID, LEVOTHROID) 150 MCG tablet Take 150 mcg by mouth daily before breakfast.   Yes Historical Provider, MD  meclizine (ANTIVERT) 25 MG tablet Take 25 mg by mouth 3 (three) times daily as needed for dizziness.   Yes Historical Provider, MD  midodrine (PROAMATINE) 10 MG tablet Take 10 mg by mouth 2 (two) times daily. 03/19/13  Yes Cassell Clement, MD  Multiple Vitamin (MULTIVITAMIN WITH MINERALS) TABS Take 1 tablet by mouth daily.   Yes Historical Provider, MD  pyridostigmine (MESTINON) 60 MG tablet Take 60 mg by mouth 3 (three) times daily. Breakfast lunch and dinner 04/06/11  Yes Duke Salvia, MD  tamsulosin (FLOMAX) 0.4 MG CAPS Take 0.4 mg by mouth daily.   Yes Historical Provider, MD  traMADol (ULTRAM) 50 MG tablet Take 50 mg by mouth every 12 (twelve) hours as needed for moderate pain.  04/24/13  Yes Historical Provider, MD    Inpatient Medications:  . Chlorhexidine Gluconate Cloth  6 each Topical Q0600  . collagenase   Topical Daily  . digoxin  125 mcg Oral Daily  . dorzolamide-timolol  1 drop Both Eyes Daily  . finasteride  5 mg Oral Daily  . heparin  5,000 Units Subcutaneous Q8H  . [START ON 07/04/2013] influenza vac split quadrivalent PF  0.5 mL Intramuscular Tomorrow-1000  . levothyroxine  150 mcg Oral QAC breakfast  . midodrine  10 mg Oral BID WC  . mupirocin ointment  1 application Nasal BID  . pyridostigmine  60 mg Oral TID WC  . sodium chloride  3 mL Intravenous Q12H  . tamsulosin  0.4 mg Oral Daily      Allergies: No Known Allergies  History   Social History  . Marital Status: Married    Spouse Name: N/A    Number of Children: 2  . Years of Education: N/A   Occupational History  . Retired    Social History Main Topics  . Smoking status: Former Smoker -- 1.00  packs/day for 25 years    Types: Cigarettes    Quit date: 08/09/1965  . Smokeless tobacco: Never Used  . Alcohol Use: No     Comment: "last alcohol ~ 2008"  . Drug Use: No  . Sexual Activity: No   Other Topics Concern  . Not on file   Social History Narrative  . No narrative on file     Family History  Problem Relation Age of Onset  . Cancer Mother 50    GYN     Review of Systems: A 12 point ROS was performed; pertinent positives and negatives were noted in the HPI  Physical Exam: Blood pressure 103/52, pulse 67, temperature 98.1 F (36.7 C), temperature source Oral, resp. rate 17, height 6\' 3"  (1.905 m), weight 195 lb 1.7 oz (88.5 kg), SpO2 96.00%. General: Chronically ill appearing elderly male, alert and oriented, in no acute distress. HEENT: Normocephalic, atraumatic, sclera non-icteric, nares are without discharge Neck: Supple. Carotids 2+ without bruits. JVP normal Lungs: Clear bilaterally to auscultation without wheezes, rales, or rhonchi. Breathing is unlabored. Heart: Irregularly irregular. 3/6 systolic murmur. Abdomen: Soft, non-tender, non-distended with normoactive bowel sounds. No hepatomegaly. No rebound/guarding. No obvious abdominal masses. Back: Sacral decubitus ulceration Extremities: BLE with scattered ecchymoses and foam dressing is applied to the left shin. The left shin is very tender to palpation. Faint pedal pulses palpable. Neuro: CNII-XII intact, moves all extremities spontaneously. Psych:  Responds to questions appropriately with a normal affect.    Labs: No results found for this basename: CKTOTAL, CKMB, TROPONINI,  in the last 72 hours Lab Results  Component Value Date   WBC 7.8 07/03/2013   HGB 11.3* 07/03/2013   HCT 34.4* 07/03/2013   MCV 94.8 07/03/2013   PLT 224 07/03/2013    Recent Labs Lab 07/03/13 0615  NA 138  K 4.1  CL 98  CO2 31  BUN 15  CREATININE 1.07  CALCIUM 8.7  PROT 6.4  BILITOT 0.9  ALKPHOS 84  ALT 14  AST  22  GLUCOSE 84   No results found for this basename: CHOL, HDL, LDLCALC, TRIG   Lab Results  Component Value Date   DDIMER 0.45 03/10/2012    Radiology/Studies:  Dg Chest 2 View  07/02/2013   CLINICAL DATA:  Leg swelling, history of CHF  EXAM: CHEST  2 VIEW  COMPARISON:  Prior chest x-ray 06/07/2012  FINDINGS: Stable cardiomegaly. Atherosclerotic calcifications noted in the transverse aorta. Left subclavian approach cardiac rhythm maintenance device with leads projecting over the right atrium and right ventricle. Mild pulmonary vascular congestion, slightly progressed from prior but without overt edema. Elevation of left hemidiaphragm with mild lingular atelectasis. No pneumothorax or large pleural effusion. No acute osseous abnormality. Central bronchitic changes are similar compared to prior.  IMPRESSION: Mild pulmonary vascular congestion without overt edema.  Stable cardiomegaly.   Electronically Signed   By: Malachy Moan M.D.   On: 07/02/2013 17:22   Ct Head Wo Contrast  07/02/2013   CLINICAL DATA:  Left-sided neck pain, recent falls  EXAM: CT HEAD WITHOUT CONTRAST  CT CERVICAL SPINE WITHOUT CONTRAST  TECHNIQUE: Multidetector CT imaging of the head and cervical spine was performed following the standard protocol without intravenous contrast. Multiplanar CT image reconstructions of the cervical spine were also generated.  COMPARISON:  04/19/2012  FINDINGS: CT HEAD FINDINGS  Mild soft tissue changes are noted within the left maxillary antrum likely of a chronic nature. The bony calvarium is intact. Atrophic changes are identified as well as chronic white matter ischemic change. No findings to suggest acute hemorrhage, acute infarction or space-occupying mass lesion are noted.  CT CERVICAL SPINE FINDINGS  Seven cervical segments are well visualized. Vertebral body height is well maintained. Multilevel facet hypertrophic changes are noted. No acute fracture or acute facet abnormality is noted.  The surrounding soft tissues are within normal limits with the exception of carotid calcifications.  IMPRESSION: CT head:  Chronic changes without acute abnormality.  CT of the cervical spine: Degenerative change without acute abnormality.   Electronically Signed   By: Alcide Clever M.D.   On: 07/02/2013 19:07  Ct Cervical Spine Wo Contrast  07/02/2013   CLINICAL DATA:  Left-sided neck pain, recent falls  EXAM: CT HEAD WITHOUT CONTRAST  CT CERVICAL SPINE WITHOUT CONTRAST  TECHNIQUE: Multidetector CT imaging of the head and cervical spine was performed following the standard protocol without intravenous contrast. Multiplanar CT image reconstructions of the cervical spine were also generated.  COMPARISON:  04/19/2012  FINDINGS: CT HEAD FINDINGS  Mild soft tissue changes are noted within the left maxillary antrum likely of a chronic nature. The bony calvarium is intact. Atrophic changes are identified as well as chronic white matter ischemic change. No findings to suggest acute hemorrhage, acute infarction or space-occupying mass lesion are noted.  CT CERVICAL SPINE FINDINGS  Seven cervical segments are well visualized. Vertebral body height is well maintained. Multilevel facet hypertrophic changes are noted. No acute fracture or acute facet abnormality is noted. The surrounding soft tissues are within normal limits with the exception of carotid calcifications.  IMPRESSION: CT head:  Chronic changes without acute abnormality.  CT of the cervical spine: Degenerative change without acute abnormality.   Electronically Signed   By: Alcide Clever M.D.   On: 07/02/2013 19:07    EKG:  07/03/13: Paced rhythm  ASSESSMENT AND PLAN:  79 y.o. male w/ PMHx significant for paroxysmal atrial fibrillation s/p pacemaker, diastolic CHF, autonomic dysfucntion and hypotension, and COPD presented to [facility] on 07/02/2013 with a CHF exacerbation.   1. CHF: Pt with h/o CHF. Last TTE was 03/2012 with EF 65-70%, mild  LVH, and vigorous systolic fx, no wall motion abnormalities. He is on Lasix 20mg  BID at home, which seems to be carefully up titrated as needed by his Cardiologist, Dr. Patty Sermons given his autonomic dysfunction. He will need a repeat ECHO today to reassess his EF and LV function. With his worsening fluid overload in the setting of hs autonomic dysfunction, he is just being treated with 2 counteracting medications with the diuretic and the fludrocortisone. With his worsening symptoms, and now that the patient is virtually bed bound given his sacral decubitus ulcer, recommending symptomatic treatment with Lasix and elevated the patient's legs. Continue to hold the fludrocortisone. Consider consulting Palliative Care for a discussion regarding goals of care for the patient.   2. Autonomic dysfunction with hypotension: As noted above in #1. BP low but stable for now. Will have to monitor closely given his diuresis. Continue home Midodrine.   3. Paroxsymal atrial fibrillation: Pt on Digoxin at home and has a pacemaker. His Coumadin was stopped about 1 year ago. Unsure if it was stopped by his PCP or Dr. Patty Sermons or by the pt himself. Per Dr. Yevonne Pax note from 06/26/13 the pt has refused anticoagulation. The digoxin was continued on admission, and appears to be sub therapeutic; dosing per Pharmacy. His rhythm is a paced rhythm.   4. ? Myasthenia gravis: Pt is on Mestinon at home. Per admission note, the pt and his daughter deny any diagnosis of MG. Mestinon continued on admission.    Signed, Genelle Gather, MD Internal Medicine Resident, PGY II [facility] Internal Medicine Program 07/03/2013 11:24 AM    Attending Note:   The patient was seen and examined.  Agree with assessment and plan as noted above.  Changes made to the above note as needed.  Mr. Lowdermill's condition has deteriated over the years.  He is basically bed bound ( has heel ulcers and decubitus ulcer.  He has leg edema that  should be treated with leg  elevation.    He has significant volume overload but has significant dysautonomia.   There is not much more that we can do for this.  I would favor hospice and palliative care consult to discuss goals of care.    Vesta Mixer, Montez Hageman., MD, [facility] 07/03/2013, 7:07 PM

## 2013-07-03 NOTE — Telephone Encounter (Signed)
Dr. Elbert Ewings - Pt's daughter called to let you know Chalres has been put in the hospital with congestive heart failure, UTI and some other problems.  Sh cancelled his appointment with you for tomorrow, but just wanted you to know why.

## 2013-07-03 NOTE — H&P (Signed)
FMTS Attending Admission Note: Renold Don MD Personal pager:  [phone] FPTS Service Pager:  [phone]  I  have seen and examined this patient, reviewed their chart. I have discussed this patient with the resident. I agree with the resident's findings, assessment and care plan.  Additionally:  Briefly, 87 M with known PAF s/p pacemaker not on any chronic anticoagulation per his own choice, dysautonomia, chronic heart failure, COPD, and questionable myesthenia gravis who presents with 1 day history of syncope.  Patient fell after standing suddenly while visiting family yesterday.  Of note, with several days worsening work of breathing.  Home diuretic increased.  Also with increasing falls at home.  Brought to ED for eval after fall, found to be hypotensive, admitted to [facility] for further w/u.  Doing well this AM, c/o back pain.   Exam: - Gen:  Alert and oriented, pleasant. Heart:  RRR.  Grade II/VI murmur upper sternal borders Lungs:  No increased WOB.  Some mild wheezing at BL bases.  No rales noted. Abdomen:  NT Ext:  +3 edema to midshins BL Back:  Decubitus ulcer noted, covered with bandage. Focal pain directly over lower lumbar spine.  No deformity noted.  Skin:  Chronic stasis changes BL LE's.  Also with wound on lateral aspect of Left calf.    Imp/Plan: 1.  Syncope: - likely orthostatic - now with focalized back pain middle of back - hypotensive here, likely cause of previous falls as well.  - see #2 below  2.  Dyspnea: - worsening despite increase of Lasix.  Mild congestion noted on CXR, along with what looks like hiatal hernia.  - BNP elevated, looks fluid overloaded based on LE edema.  No rales noted.  Consult cardiology for help with diuresis in light of  - Hx/o COPD listed in his chart.  Looks like he hasn't seen anyone besides Cardiology for over a year.  - Will start short-acting inhaler for any reversible causes today.   - Recommend starting long-acting inhalers vs PFTs to  ensure COPD as diagnosis.    3. Question of MG: - Agree with contacting Neurology for records.   Tobey Grim, MD 07/03/2013 8:42 AM

## 2013-07-03 NOTE — Discharge Summary (Signed)
Family Medicine Teaching [facility] Discharge Summary  Patient name: Marco Gregory Medical record number: [id number] Date of birth: May 18, 1921 Age: 79 y.o. Gender: male Date of Admission: 07/02/2013  Date of Discharge: 07/12/13 Admitting Physician: Tobey Grim, MD  Primary Care Provider: Elvina Sidle, MD Consultants: Surgery, Cardiology (HF Team), Palliative Care, PT/OT  Indication for Hospitalization: HF exacerbation  Discharge Diagnoses/Problem List:  1. Acute on Chronic HF exacerbation 2. Cellulitis 3. Sacral decubitus ulcer 4. Dysautonomia with Hypotension 5. Mechanical Fall 6. Afib 7. Myasthenia Gravis 8. Hypothyriodism 9. Hyperkalemia  Disposition: SNF [facility])  Discharge Condition: Stable  Brief Hospital Course: Marco Gregory is a 79 y.o. male with Paroxysmal atrial fibrillation s/p Pacemaker, Diastolic CHF, and COPD presents with worsening SOB and frequent falls. On admission he was found to have left lower leg wound with cellulitis and sacral decubitus ulcer. Due to his multiple medical problems social work and palliative care was consulted to discuss goals of care and placement, and decision was made for transfer to SNF.  # Acute on Chronic HF exacerbation:  Dyspnea on admission with 2+ LE edema, BNP (2389), wt 202 lbs, home regimen Lasix PO 20mg  daily. Florinef, Flomax was discontinued. His cardiologist (Dr. Patty Sermons) was consulted for HF management of diuresis, initially with Lasix IV 40mg  daily, eventually held diuretics x multiple days due to hypotension and dry fluid status. Overall, down 36 lbs on discharge, recommended home regimen Lasix PO 20mg  QOD.  # Sacral decubitus ulcer (Stage III), non-infected POA, and wound care recommended surgical consult, who decided that no surgical debridement required at this time. Additionally, risk of poor surgical outcome. Continue wound care management with Santyl applied daily.  # LE edema and Left  leg ulcer/cellulitis Most likely due to HF and vascular insufficiency. Wound care was consulted and completed 7 day course of Bactrim used to treat cellulitis. ABI showed R-incompressible likely secondary to calcification and L-arteries (1.3, 1.4, mild calcification)  # h/o Falls: Global weakness and PT recommend SNF placement. Family declined Palliative care at this time.  # Myasthenia : This is documented in the medical record but per patient and daughter, they are unaware of this condition. Continued [place] in hospital due to risk vs benefit discuss with Pharmacy and continued at discharge.  # Hypothyroidism: Continue Synthroid  # Hyperkalemia - resolved  Issues for Follow Up:  1. Wound Care: Sacral decub ulcer (Stage III), Left outer LE, Right upper LE. Continue topical wound care management with Santyl ointment applied daily, cover with wet gauze. No surgical debridement.  2. CHF Management: Discharge weight 175 lb. Continue to monitor fluid status daily. Per Cardiology HF team,  recommended resume Lasix PO 20mg  every other day. Consider holding dose if remains dry and/or BP < 100/60.  3.   High Fall Risk - H/o global weakness and autonomic dysfunction, associated with increased risk of hypotension.  Significant Procedures: None  Significant Labs and Imaging:   Recent Labs Lab 07/09/13 0718 07/10/13 0555 07/11/13 0603  WBC 8.0 10.9* 11.9*  HGB 12.8* 14.1 13.6  HCT 39.0 42.7 41.3  PLT 266 275 284    Recent Labs Lab 07/08/13 0615 07/09/13 0718 07/10/13 0555 07/10/13 1448 07/11/13 0603  NA 130* 134* 130* 130* 134*  K 4.8 5.2* 5.9* 5.7* 5.0  CL 93* 96 95* 95* 99  CO2 31 32 30 25 27   GLUCOSE 98 109* 117* 136* 128*  BUN 22 24* 26* 31* 30*  CREATININE 1.14 1.19 1.27 1.19 1.23  CALCIUM  8.9 9.3 9.3 9.6 9.3   CT HEAD FINDINGS  Mild soft tissue changes are noted within the left maxillary antrum  likely of a chronic nature. The bony calvarium is intact. Atrophic   changes are identified as well as chronic white matter ischemic  change. No findings to suggest acute hemorrhage, acute infarction or  space-occupying mass lesion are noted.   CT CERVICAL SPINE FINDINGS  Seven cervical segments are well visualized. Vertebral body height  is well maintained. Multilevel facet hypertrophic changes are noted.  No acute fracture or acute facet abnormality is noted. The  surrounding soft tissues are within normal limits with the exception  of carotid calcifications.   IMPRESSION:  CT head: Chronic changes without acute abnormality.  CT of the cervical spine: Degenerative change without acute  abnormality.  Results/Tests Pending at Time of Discharge: None  Discharge Medications:    Medication List    STOP taking these medications       fludrocortisone 0.1 MG tablet  Commonly known as:  FLORINEF     tamsulosin 0.4 MG Caps capsule  Commonly known as:  FLOMAX      TAKE these medications       collagenase ointment  Commonly known as:  SANTYL  Apply topically daily.     digoxin 0.125 MG tablet  Commonly known as:  LANOXIN  Take 1 tablet (125 mcg total) by mouth daily.     dorzolamide-timolol 22.3-6.8 MG/ML ophthalmic solution  Commonly known as:  COSOPT  Place 1 drop into both eyes daily.     feeding supplement (ENSURE) Pudg  Take 1 Container by mouth 2 (two) times daily between meals.     feeding supplement (PRO-STAT SUGAR FREE 64) Liqd  Take 30 mLs by mouth 3 (three) times daily with meals.     finasteride 5 MG tablet  Commonly known as:  PROSCAR  Take 5 mg by mouth daily.     furosemide 20 MG tablet  Commonly known as:  LASIX  Take 1 tablet (20 mg total) by mouth every other day.     levothyroxine 150 MCG tablet  Commonly known as:  SYNTHROID, LEVOTHROID  Take 150 mcg by mouth daily before breakfast.     meclizine 25 MG tablet  Commonly known as:  ANTIVERT  Take 25 mg by mouth 3 (three) times daily as needed for dizziness.      midodrine 10 MG tablet  Commonly known as:  PROAMATINE  Take 10 mg by mouth 2 (two) times daily.     multivitamin with minerals Tabs tablet  Take 1 tablet by mouth daily.     pyridostigmine 60 MG tablet  Commonly known as:  MESTINON  Take 60 mg by mouth 3 (three) times daily. Breakfast lunch and dinner     traMADol 50 MG tablet  Commonly known as:  ULTRAM  Take 50 mg by mouth every 12 (twelve) hours as needed for moderate pain.       Discharge Instructions: Please refer to Patient Instructions section of EMR for full details.  Patient was counseled important signs and symptoms that should prompt return to medical care, changes in medications, dietary instructions, activity restrictions, and follow up appointments.   Follow-Up Appointments: Follow-up Information   Follow up with [facility]. (physical and occupational therapy, nurse, aide)    Contact information:   [address] [place] [address] [phone]       Schedule an appointment as soon as possible for a  visit with Elvina Sidle, MD.   Specialty:  [facility] Medicine   Contact information:   [address] [place] [address] [phone]       Saralyn Pilar, DO 07/12/2013, 10:44 AM PGY-1, [facility] Family Medicine

## 2013-07-03 NOTE — Evaluation (Signed)
Physical Therapy Evaluation Patient Details Name: Marco Gregory MRN: [id number] DOB: 1920/10/01 Today's Date: 07/03/2013 Time: 4098-1191 PT Time Calculation (min): 25 min  PT Assessment / Plan / Recommendation History of Present Illness  Marco Gregory is a 79 y.o. male with Paroxysmal atrial fibrillation s/p Pacemaker, Diastolic CHF, and COPD presents with worsening SOB and frequent falls.  Clinical Impression  Pt admitted with above. Pt currently with functional limitations due to the deficits listed below (see PT Problem List). Will need NHP.   Pt will benefit from skilled PT to increase their independence and safety with mobility to allow discharge to the venue listed below.     PT Assessment  Patient needs continued PT services    Follow Up Recommendations  Supervision/Assistance - 24 hour;SNF         Barriers to Discharge Decreased caregiver support      Equipment Recommendations  None recommended by PT         Frequency Min 3X/week    Precautions / Restrictions Precautions Precautions: Fall Restrictions Weight Bearing Restrictions: No   Pertinent Vitals/Pain VSS, no pain      Mobility  Bed Mobility Bed Mobility: Supine to Sit;Sitting - Scoot to Edge of Bed;Sit to Supine Supine to Sit: 3: Mod assist Sitting - Scoot to Edge of Bed: 4: Min assist Sit to Supine: 3: Mod assist;HOB elevated Details for Bed Mobility Assistance: Assist to support trunk OOB and to transition LEs in/out of bed. Incr time due to pain. Transfers Transfers: Sit to Stand;Stand to Sit Sit to Stand: 3: Mod assist;With upper extremity assist;From bed Stand to Sit: 3: Mod assist;With upper extremity assist;To bed Details for Transfer Assistance: Gave pt a bath at EOB as pt was sweaty.  Changed all linens.  Stood at EOB and cleaned pts bottom.  Pt able to stand with steadying assist.   Ambulation/Gait Ambulation/Gait Assistance: Not tested (comment) Stairs: No Wheelchair  Mobility Wheelchair Mobility: No         PT Diagnosis: Generalized weakness  PT Problem List: Decreased activity tolerance;Decreased strength;Decreased balance;Decreased mobility;Decreased knowledge of use of DME;Decreased safety awareness;Decreased knowledge of precautions PT Treatment Interventions: Gait training;Functional mobility training;Therapeutic activities;Therapeutic exercise;Balance training;Patient/family education;DME instruction     PT Goals(Current goals can be found in the care plan section) Acute Rehab PT Goals Patient Stated Goal: to feel better PT Goal Formulation: With patient Time For Goal Achievement: 07/10/13 Potential to Achieve Goals: Good  Visit Information  Last PT Received On: 07/03/13 Assistance Needed: +2 History of Present Illness: Marco Gregory is a 79 y.o. male with Paroxysmal atrial fibrillation s/p Pacemaker, Diastolic CHF, and COPD presents with worsening SOB and frequent falls.       Prior Functioning  Home Living Family/patient expects to be discharged to:: Private residence Living Arrangements: Alone Type of Home: House Additional Comments: Pt states his wife has been at rehab for several weeks.  Has family living near by but would not specify whether they would be able to provide assist.  Pt providing very limited information. Prior Function Level of Independence: Independent (per pt report) Communication Communication: No difficulties    Cognition  Cognition Arousal/Alertness: Awake/alert Behavior During Therapy: WFL for tasks assessed/performed (internally distracted by back pain) Overall Cognitive Status: No family/caregiver present to determine baseline cognitive functioning    Extremity/Trunk Assessment Upper Extremity Assessment Upper Extremity Assessment: Defer to OT evaluation Lower Extremity Assessment Lower Extremity Assessment: Generalized weakness   Balance Balance Balance Assessed: Yes Static Standing  Balance Static Standing - Balance Support: Bilateral upper extremity supported;During functional activity Static Standing - Level of Assistance: 4: Min assist Static Standing - Comment/# of Minutes: 3  End of Session PT - End of Session Equipment Utilized During Treatment: Gait belt Activity Tolerance: Patient limited by fatigue Patient left: with call bell/phone within reach;with bed alarm set;in bed Nurse Communication: Mobility status       Gregory,Marco Rising 07/03/2013, 4:22 PM [facility] Acute Rehabilitation [phone] (pager)

## 2013-07-03 NOTE — Progress Notes (Signed)
The patient's MRSA screen came back positive.  The patient was left on the proper contact precautions and the MRSA order set was put in.

## 2013-07-03 NOTE — Progress Notes (Signed)
The patient has an order for a Foley catheter due to his pressure ulcer on his buttocks and incontinence.  The MD was asked if he still wanted the Foley catheter placed since the condom catheter seemed to be working well.  The MD asked that the Foley catheter be placed.  The RN will carry out the orders.

## 2013-07-03 NOTE — Telephone Encounter (Signed)
Spoke with daughter and patient currently admitted

## 2013-07-03 NOTE — Progress Notes (Signed)
  Echocardiogram 2D Echocardiogram has been performed.  Arvil Chaco 07/03/2013, 11:12 AM

## 2013-07-03 NOTE — Consult Note (Addendum)
WOC wound consult note Reason for Consult: Consult requested for sacrum and bilat legs.  Pt is reported to have fallen several times when at home during the past 10 days. Appearance of wounds to sacrum and both legs is consistent with this time frame; not during the past several days. Pt states he was not aware he had a "bad wound back there." Wound type: Sacrum with unstageable wound 6X7cm, 100% slough, mod amt tan drainage, fluctuant with strong foul odor.  This wound probably was a deep tissue injury R/T a fall which evolved into full thickness tissue loss.  RECOMMEND SURGICAL CONSULT for debridement of nonviable tissue.  Discussed plan of care with primary team. Santyl can be applied for chemical debridement after nonviable layer is removed. Pressure Ulcer POA: Yes  Left and right calves with dark purple linear deep tissue injury across entire anterior leg.  Approx 7X2cm; appearance consistent with hitting a hard object when he fell at some point.  No open wound or drainage; wounds are high risk to evolve into full thickness tissue loss. No topical treatment needed at this time. Left outer leg with full thickness abrasion; 5X3.5X.2cm; 100% yellow slough, mod amt yellow drainage, no odor. Right outer upper leg with full thickness abrasion; 3X1X.2cm, 10% dark red, 90% yellow slough, mod amt yellow drainage, no odor.  Both legs with generalized edema, erythremia, and scattered areas of red purpura.  Dressing procedure/placement/frequency: Santyl ointment to chemically debride nonviable tissue to left and right leg wounds.  Please re-consult if further assistance is needed.  Thank-you,  Cammie Mcgee MSN, RN, CWOCN, [place], CNS [phone]

## 2013-07-03 NOTE — Progress Notes (Signed)
The patient's daughter called and would like to speak with the physician in the morning after seeing the patient.  Her name is [name].  Cell:  [phone]  Home:  [phone].

## 2013-07-04 ENCOUNTER — Ambulatory Visit: Payer: Medicare Other | Admitting: Family Medicine

## 2013-07-04 DIAGNOSIS — R5381 Other malaise: Secondary | ICD-10-CM

## 2013-07-04 DIAGNOSIS — I5032 Chronic diastolic (congestive) heart failure: Secondary | ICD-10-CM

## 2013-07-04 DIAGNOSIS — M25519 Pain in unspecified shoulder: Secondary | ICD-10-CM

## 2013-07-04 DIAGNOSIS — L89109 Pressure ulcer of unspecified part of back, unspecified stage: Secondary | ICD-10-CM

## 2013-07-04 DIAGNOSIS — I83009 Varicose veins of unspecified lower extremity with ulcer of unspecified site: Secondary | ICD-10-CM

## 2013-07-04 DIAGNOSIS — M79609 Pain in unspecified limb: Secondary | ICD-10-CM

## 2013-07-04 DIAGNOSIS — Z515 Encounter for palliative care: Secondary | ICD-10-CM

## 2013-07-04 LAB — PROTIME-INR
INR: 1.23 (ref 0.00–1.49)
Prothrombin Time: 15.2 seconds (ref 11.6–15.2)

## 2013-07-04 MED ORDER — PRO-STAT SUGAR FREE PO LIQD
30.0000 mL | Freq: Three times a day (TID) | ORAL | Status: DC
  Administered 2013-07-04 – 2013-07-12 (×22): 30 mL via ORAL
  Filled 2013-07-04 (×26): qty 30

## 2013-07-04 MED ORDER — FUROSEMIDE 20 MG PO TABS
20.0000 mg | ORAL_TABLET | Freq: Two times a day (BID) | ORAL | Status: DC
  Administered 2013-07-04 – 2013-07-06 (×4): 20 mg via ORAL
  Filled 2013-07-04 (×6): qty 1

## 2013-07-04 MED ORDER — FUROSEMIDE 40 MG PO TABS
40.0000 mg | ORAL_TABLET | Freq: Two times a day (BID) | ORAL | Status: DC
  Filled 2013-07-04 (×2): qty 1

## 2013-07-04 MED ORDER — FUROSEMIDE 10 MG/ML IJ SOLN: 40.0000 mg | Freq: Once | INTRAMUSCULAR | Status: DC

## 2013-07-04 MED ORDER — CALCIUM CARBONATE ANTACID 500 MG PO CHEW
1.0000 | CHEWABLE_TABLET | Freq: Once | ORAL | Status: AC
  Administered 2013-07-04: 200 mg via ORAL
  Filled 2013-07-04: qty 1

## 2013-07-04 NOTE — Consult Note (Signed)
Patient [name]      DOB: 04-Oct-1920      WUJ:[id number]     Consult Note from the Palliative Medicine Team at [facility]    Consult Requested by: Dr. Gwendolyn Gregory     PCP: Marco Sidle, MD Reason for Consultation: GOC     Phone [phone]  Assessment of patients Current state: 79 yr old white male with full capacity for decision making admitted with decompensated heart failure, diastolic in nature, falling due to known autonomic dysfunction, and wounds to legs and buttocks.  Patient's daughter relates that she feels the patient is being pushed out of the hospital.  Her expectation is that he receives full standard of care treatment before discharge.  They would like to confirm with Dr. Patty Gregory that their are not alternative treatment options that might be available outside the [facility] system, before accepting that he is "just dying".  I was able to start to reframe his current condition to try to help them see how his heart and blood pressure conditions are limiting his mobility and function and that they are getting into a cycle of decline because of this with his sacral would and decreasing mobility.  They are starting to understand and asked to talk some about hospice so they would know all their options.  Patient was able to inform his daughter that he does not want go to rehab even for a short time, he does not want to have CPR or life support.  He wants to enjoy his family and realizes that the tide may be turning on his life and that death may be coming.    Goals of Care: 1.  Code Status: DNR   2. Scope of Treatment: Short of CPR and Life support.  The patient and family are expecting full course of standard treatment for his heart and wounds.  A MOST for was given for review and completion  4. Disposition: Patient wants to go home.  Family is trying to provide 24/7 care givers and asked about hospice .  This patient would likely qualify for hospice as if  his cardiac condition continues to be difficult to manage he will likely have a prognosis of 6 months are less  3. Symptom Management:  1. Pain: continue norco.  Would medicate prior to dressing changes.  Add air mattress overlay 2. Bowel Regimen: monitor, last movement 11/25 3. Fever: tylenol prn 4. Dyspnea improved with treatment for CHF, continue to optimize regimen  4. 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  Marco Gregory was present and relates that her dad and mom have cared for her since she developed childhood polio and she will care for them.   5. Spiritual: Marco Gregory, chaplains offered.        Patient Documents Completed or Given: Document Given Completed  Advanced Directives Pkt    MOST 1   DNR    Gone from My Sight    Hard Choices 2     Brief HPI: 79 yr old white male admitted with multiple falls and increasing edema.  We were asked to assist with goals of care   ROS: pain in buttocks and right shoulder, shrtness of breath better    PMH:  Past Medical History  Diagnosis Date  . Long-term (current) use of anticoagulants   . Glaucoma   . Gout   . Hyperthyroidism   .  Orthostatic hypotension   . Redundant prepuce and phimosis   . Acute cystitis   . Hypertonicity of bladder   . HTN (hypertension)   . Atelectasis   . Dysautonomia   . Pacemaker- [facility] [employer]     DOI 2012  . CHF (congestive heart failure)     Previous preserved EF.  [name] Atrial fibrillation   . Sinus node dysfunction   . Pneumonia 08/2011    after hernia OR  . Hypothyroidism   . Sleep apnea     does not use cpap ; last used ~ 2011 (03/10/12)  . Spinal stenosis of lumbar region   . Chronic kidney disease [facility]     UTI  1 MO AGO TX MEDICALLY   . Osteomyelitis of toe of right foot ~ 2009    amputated 2nd toe     PSH: Past Surgical History  Procedure Laterality Date  . Elbow bursa  surgery  04/07/2006    Right elbow septic olecranon bursitis  . Toe amputation  ~ 2009    Osteomyelitis, right foot second toe /  Right foot second ray amputation  . Cardiac catheterization  07/03/2002    EF 60-70% /  Normal left ventricular function. / Very mild trivial three-vessel coronary atherosclerosis. / There is no mitral regurgitation noted  . Tonsillectomy and adenoidectomy      "when I was a youngster"  . Appendectomy      "when I was a youngster"  . Inguinal hernia repair  ~ 2003/13    right  . Cataract extraction w/ intraocular lens  implant, bilateral  01/2005   I have reviewed the FH and SH and  If appropriate update it with new information. No Known Allergies Scheduled Meds: . Chlorhexidine Gluconate Cloth  6 each Topical Q0600  . collagenase   Topical Daily  . digoxin  125 mcg Oral Daily  . dorzolamide-timolol  1 drop Both Eyes Daily  . feeding supplement (PRO-STAT SUGAR FREE 64)  30 mL Oral TID WC  . finasteride  5 mg Oral Daily  . furosemide  20 mg Oral BID  . heparin  5,000 Units Subcutaneous Q8H  . levothyroxine  150 mcg Oral QAC breakfast  . midodrine  10 mg Oral BID WC  . mupirocin ointment  1 application Nasal BID  . pyridostigmine  60 mg Oral TID WC  . sodium chloride  3 mL Intravenous Q12H  . sulfamethoxazole-trimethoprim  1 tablet Oral Q12H  . tamsulosin  0.4 mg Oral Daily   Continuous Infusions:  PRN Meds:.HYDROcodone-acetaminophen, sodium chloride, traMADol    BP 120/54  Pulse 70  Temp(Src) 97.4 F (36.3 C) (Oral)  Resp 18  Ht 6\' 3"  (1.905 m)  Wt 83.5 kg (184 lb 1.4 oz)  BMI 23.01 kg/m2  SpO2 94%   PPS:30%   Intake/Output Summary (Last 24 hours) at 07/04/13 1816 Last data filed at 07/04/13 0700  Gross per 24 hour  Intake    280 ml  Output    950 ml  Net   -670 ml   LBM:11/25                         Physical Exam:  General: mild distress due to discomfort in back and shoulders HEENT:  PERRL, EOMI, anicteric, mmm Chest:    Decreased, no rhonchi, rales or wheezes CVS: distant , regular, s1.S2 Abdomen:soft, scaphoid, not tender Ext: right leg half dollar sized wound stage 2-3, multiple  smaller wounds , edema decreased Neuro:awake , alert but hard of hearing.  Labs: CBC    Component Value Date/Time   WBC 7.8 07/03/2013 0615   WBC 7.5 06/07/2012 1607   RBC 3.63* 07/03/2013 0615   RBC 4.52* 06/07/2012 1607   HGB 11.3* 07/03/2013 0615   HGB 13.7* 06/07/2012 1607   HCT 34.4* 07/03/2013 0615   HCT 45.2 06/07/2012 1607   PLT 224 07/03/2013 0615   MCV 94.8 07/03/2013 0615   MCV 99.9* 06/07/2012 1607   MCH 31.1 07/03/2013 0615   MCH 30.3 06/07/2012 1607   MCHC 32.8 07/03/2013 0615   MCHC 30.3* 06/07/2012 1607   RDW 13.6 07/03/2013 0615   LYMPHSABS 1.0 07/02/2013 1611   MONOABS 1.0 07/02/2013 1611   EOSABS 0.1 07/02/2013 1611   BASOSABS 0.0 07/02/2013 1611      CMP     Component Value Date/Time   NA 138 07/03/2013 0615   K 4.1 07/03/2013 0615   CL 98 07/03/2013 0615   CO2 31 07/03/2013 0615   GLUCOSE 84 07/03/2013 0615   BUN 15 07/03/2013 0615   CREATININE 1.07 07/03/2013 0615   CREATININE 1.06 06/07/2012 1603   CALCIUM 8.7 07/03/2013 0615   PROT 6.4 07/03/2013 0615   ALBUMIN 2.7* 07/03/2013 0615   AST 22 07/03/2013 0615   ALT 14 07/03/2013 0615   ALKPHOS 84 07/03/2013 0615   BILITOT 0.9 07/03/2013 0615   GFRNONAA 59* 07/03/2013 0615   GFRAA 68* 07/03/2013 0615    Chest Xray Reviewed/Impressions: mild vascular congestion no overt edema  CT scan of the Head Reviewed/Impressions: chronic changes with spinal degenerative changes    Time In Time Out Total Time Spent with Patient Total Overall Time  415 pm 615 pm 120 min 120 min    Greater than 50%  of this time was spent counseling and coordinating care related to the above assessment and plan.   Caeleigh Prohaska L. [name], MD MBA The Palliative Medicine Team at [facility] Phone: [phone] Pager: [phone]

## 2013-07-04 NOTE — Progress Notes (Signed)
FMTS Attending Note  I personally saw and evaluated the patient. The plan of care was discussed with the resident team. I agree with the assessment and plan as documented by the resident.   Patient laying in bed, oriented to person/place/self/president. Currently lives alone and has aid in the house on a daily basis.  1. CHF exacerbation - dyspnea improved with diuresis, wean lasix per Cardiology recommendations 2. Sacral Decubitus ulceration - unstageable due to eschar, no plan for debridement per surgery at this time, continue frequent turns and local wound care 3. Venous stasis ulcers on RLE - continue local wound care, agree with antibiotics to treat overlying cellulitis, awaiting ABI's prior to considering Unna boot 4. Debility - appreciate palliative care help with goals of care, in my short conversation with the patient he is hesitant to transfer to Nursing Home. I doubt that he can adequately perform ADL's to live on his own based on my exam, PT recommends SNF at discharge, patient would like his relatives to help him make this decision 5. Dysautonomia/Hypotension - blood pressures stable 6. Afib - management per cardiology  Donnella Sham MD

## 2013-07-04 NOTE — Telephone Encounter (Signed)
Will forward to  Dr. Patty Sermons so he can call daughter

## 2013-07-04 NOTE — Progress Notes (Signed)
Patient Name: Marco Gregory Date of Encounter: 07/04/2013     Principal Problem:   Acute exacerbation of CHF (congestive heart failure) Active Problems:   DYSAUTONOMIA   Orthostasis   Myasthenia gravis   PAF (paroxysmal atrial fibrillation)   Skin ulcer   Decubitus ulcer of sacral region, stage 3   Fall at home    SUBJECTIVE  Less dyspneic.  CURRENT MEDS . Chlorhexidine Gluconate Cloth  6 each Topical Q0600  . collagenase   Topical Daily  . digoxin  125 mcg Oral Daily  . dorzolamide-timolol  1 drop Both Eyes Daily  . finasteride  5 mg Oral Daily  . heparin  5,000 Units Subcutaneous Q8H  . influenza vac split quadrivalent PF  0.5 mL Intramuscular Tomorrow-1000  . levothyroxine  150 mcg Oral QAC breakfast  . midodrine  10 mg Oral BID WC  . mupirocin ointment  1 application Nasal BID  . pyridostigmine  60 mg Oral TID WC  . sodium chloride  3 mL Intravenous Q12H  . sulfamethoxazole-trimethoprim  1 tablet Oral Q12H  . tamsulosin  0.4 mg Oral Daily    OBJECTIVE  Filed Vitals:   07/03/13 1700 07/03/13 1820 07/03/13 2104 07/04/13 0617  BP: 105/60 138/69 109/60 99/58  Pulse: 72  65 73  Temp: 98 F (36.7 C)  98.3 F (36.8 C) 97.5 F (36.4 C)  TempSrc: Oral  Oral Oral  Resp: 18  19 20   Height:      Weight:    184 lb 1.4 oz (83.5 kg)  SpO2: 96%  95% 96%    Intake/Output Summary (Last 24 hours) at 07/04/13 0818 Last data filed at 07/04/13 0700  Gross per 24 hour  Intake    523 ml  Output   1350 ml  Net   -827 ml   Filed Weights   07/02/13 2217 07/03/13 0221 07/04/13 0617  Weight: 195 lb 1.7 oz (88.5 kg) 195 lb 1.7 oz (88.5 kg) 184 lb 1.4 oz (83.5 kg)    PHYSICAL EXAM  General: Pleasant, NAD. Neuro: Alert and oriented X 3. Moves all extremities spontaneously. Psych: Normal affect. HEENT:  Normal  Neck: Supple without bruits or JVD. Lungs: Mild basilar rales. Heart: Grade 2/6 systolic murmur. Paced regular rhythm Abdomen: Soft, non-tender,  non-distended, BS + x 4.  Extremities: Mild pretibial edema. Covered ulcer left leg.  Accessory Clinical Findings  CBC  Recent Labs  07/02/13 1611 07/03/13 0615  WBC 7.8 7.8  NEUTROABS 5.7  --   HGB 11.4* 11.3*  HCT 33.4* 34.4*  MCV 92.0 94.8  PLT 206 224   Basic Metabolic Panel  Recent Labs  07/02/13 1611 07/03/13 0615  NA 135 138  K 3.9 4.1  CL 96 98  CO2 28 31  GLUCOSE 108* 84  BUN 17 15  CREATININE 0.97 1.07  CALCIUM 9.0 8.7   Liver Function Tests  Recent Labs  07/03/13 0615  AST 22  ALT 14  ALKPHOS 84  BILITOT 0.9  PROT 6.4  ALBUMIN 2.7*   No results found for this basename: LIPASE, AMYLASE,  in the last 72 hours Cardiac Enzymes No results found for this basename: CKTOTAL, CKMB, CKMBINDEX, TROPONINI,  in the last 72 hours BNP No components found with this basename: POCBNP,  D-Dimer No results found for this basename: DDIMER,  in the last 72 hours Hemoglobin A1C No results found for this basename: HGBA1C,  in the last 72 hours Fasting Lipid Panel No results found for  this basename: CHOL, HDL, LDLCALC, TRIG, CHOLHDL, LDLDIRECT,  in the last 72 hours Thyroid Function Tests No results found for this basename: TSH, T4TOTAL, FREET3, T3FREE, THYROIDAB,  in the last 72 hours  TELE  V-paced.  ECG  Vpaced rhythm  Radiology/Studies  Dg Chest 2 View  07/02/2013   CLINICAL DATA:  Leg swelling, history of CHF  EXAM: CHEST  2 VIEW  COMPARISON:  Prior chest x-ray 06/07/2012  FINDINGS: Stable cardiomegaly. Atherosclerotic calcifications noted in the transverse aorta. Left subclavian approach cardiac rhythm maintenance device with leads projecting over the right atrium and right ventricle. Mild pulmonary vascular congestion, slightly progressed from prior but without overt edema. Elevation of left hemidiaphragm with mild lingular atelectasis. No pneumothorax or large pleural effusion. No acute osseous abnormality. Central bronchitic changes are similar  compared to prior.  IMPRESSION: Mild pulmonary vascular congestion without overt edema.  Stable cardiomegaly.   Electronically Signed   By: Malachy Moan M.D.   On: 07/02/2013 17:22   Ct Head Wo Contrast  07/02/2013   CLINICAL DATA:  Left-sided neck pain, recent falls  EXAM: CT HEAD WITHOUT CONTRAST  CT CERVICAL SPINE WITHOUT CONTRAST  TECHNIQUE: Multidetector CT imaging of the head and cervical spine was performed following the standard protocol without intravenous contrast. Multiplanar CT image reconstructions of the cervical spine were also generated.  COMPARISON:  04/19/2012  FINDINGS: CT HEAD FINDINGS  Mild soft tissue changes are noted within the left maxillary antrum likely of a chronic nature. The bony calvarium is intact. Atrophic changes are identified as well as chronic white matter ischemic change. No findings to suggest acute hemorrhage, acute infarction or space-occupying mass lesion are noted.  CT CERVICAL SPINE FINDINGS  Seven cervical segments are well visualized. Vertebral body height is well maintained. Multilevel facet hypertrophic changes are noted. No acute fracture or acute facet abnormality is noted. The surrounding soft tissues are within normal limits with the exception of carotid calcifications.  IMPRESSION: CT head:  Chronic changes without acute abnormality.  CT of the cervical spine: Degenerative change without acute abnormality.   Electronically Signed   By: Alcide Clever M.D.   On: 07/02/2013 19:07   Ct Cervical Spine Wo Contrast  07/02/2013   CLINICAL DATA:  Left-sided neck pain, recent falls  EXAM: CT HEAD WITHOUT CONTRAST  CT CERVICAL SPINE WITHOUT CONTRAST  TECHNIQUE: Multidetector CT imaging of the head and cervical spine was performed following the standard protocol without intravenous contrast. Multiplanar CT image reconstructions of the cervical spine were also generated.  COMPARISON:  04/19/2012  FINDINGS: CT HEAD FINDINGS  Mild soft tissue changes are noted within  the left maxillary antrum likely of a chronic nature. The bony calvarium is intact. Atrophic changes are identified as well as chronic white matter ischemic change. No findings to suggest acute hemorrhage, acute infarction or space-occupying mass lesion are noted.  CT CERVICAL SPINE FINDINGS  Seven cervical segments are well visualized. Vertebral body height is well maintained. Multilevel facet hypertrophic changes are noted. No acute fracture or acute facet abnormality is noted. The surrounding soft tissues are within normal limits with the exception of carotid calcifications.  IMPRESSION: CT head:  Chronic changes without acute abnormality.  CT of the cervical spine: Degenerative change without acute abnormality.   Electronically Signed   By: Alcide Clever M.D.   On: 07/02/2013 19:07  2D echo 07/03/13:  - Left ventricle: The cavity size was normal. Wall thickness was increased in a pattern of mild LVH. The  estimated ejection fraction was 55%. Wall motion was normal; there were no regional wall motion abnormalities. Indeterminant diastolic function (atrial fibrillation). - Aortic valve: Trileaflet; moderately calcified leaflets. There was mild stenosis. Trivial regurgitation. Mean gradient: 16mm Hg (S). Peak gradient: 27mm Hg (S). - Aorta: Mildly dilated aortic root. Aortic root dimension: 39mm (ED). - Mitral valve: Mildly calcified annulus. Normal thickness leaflets . No significant regurgitation. - Left atrium: The atrium was moderately dilated. - Right ventricle: The cavity size was mildly dilated. It is not well-visualized, but I think there is a pacemaker in the RV. Systolic function was normal. - Right atrium: The atrium was moderately dilated. - Tricuspid valve: Peak RV-RA gradient:40mm Hg (S). - Pulmonary arteries: PA peak pressure: 54mm Hg (S). - Systemic veins: IVC not visualized. Impressions:  - The patient was in atrial fibrillation. Normal LV size with mild LV hypertrophy. EF  55%. Mildly dilated RV with normal systolic function. There was mild aortic stenosis. Transthoracic echocardiography. M-mode, complete 2D,    ASSESSMENT AND PLAN 1. CHF: Pt with h/o CHF. [name] He is on Lasix 20mg  BID at home, which seems to be carefully up titrated as needed by his Cardiologist, Dr. Patty Sermons given his autonomic dysfunction. He will need a repeat ECHO today to reassess his EF and LV function. With his worsening fluid overload in the setting of hs autonomic dysfunction, he is just being treated with 2 counteracting medications with the diuretic and the fludrocortisone. With his worsening symptoms, and now that the patient is virtually bed bound given his sacral decubitus ulcer, recommending symptomatic treatment with Lasix and elevated the patient's legs. Continue to hold the fludrocortisone. Consider consulting Palliative Care for a discussion regarding goals of care for the patient.  2. Autonomic dysfunction with hypotension: As noted above in #1. BP low but stable for now. Will have to monitor closely given his diuresis. Continue home Midodrine.  3. Paroxsymal atrial fibrillation: Pt on Digoxin at home and has a pacemaker. His Coumadin was stopped about 1 year ago. . The digoxin was continued on admission, and appears to be sub therapeutic; dosing per Pharmacy. His rhythm is a paced rhythm.  4. ? Myasthenia gravis: Pt is on Mestinon at home. Per admission note, the pt and his daughter deny any diagnosis of MG. Mestinon continued on admission    Signed, Cassell Clement MD

## 2013-07-04 NOTE — Progress Notes (Signed)
Thank you for consulting the Palliative Medicine Team at [facility] to meet your patient's and family's needs.   The reason that you asked [place] to see your patient is for goals of care discussion  We have scheduled your patient for a meeting: today Wednesday 11/26 @ 4:00 pm per daughter request  The Surrogate decision maker is daughter Monico Hoar c: [phone]  Other family members that need to be present: per daughter she would like her daughter, patient's granddaughter to also be present for discussion  Your patient is able to participate: on this visit patient was alert and oriented to person place and time he was able to give me the correct phone number to contact his daughter  Additional Narrative: patient and daughter informed Clinical research associate that patient's wife is currently in [facility] SNF and patient has been at home alone with family checking on him; daughter has to take her mother to an appointment this afternoon and will be back to the hospital this afternooon   Valente David, RN 07/04/2013, 10:07 AM Palliative Medicine Team RN Liaison [phone]

## 2013-07-04 NOTE — Progress Notes (Signed)
INITIAL NUTRITION ASSESSMENT  DOCUMENTATION CODES Per approved criteria  -Not Applicable   INTERVENTION: - 30 ml Prostat TID, 30 ml provides 100 kcal, 15 g protein - RD will monitor intake and adjust interventions per goals of care   NUTRITION DIAGNOSIS: Inadequate oral intake related to decreased appetite as evidenced by <25% meal intake.   Goal: Patient will meet >/=90% of estimated nutrition needs.  Monitor:  PO intake, weight, labs, GOC  Reason for Assessment: Consult, Assessment of nutrition status and requirements  79 y.o. male  Admitting Dx: Acute exacerbation of CHF (congestive heart failure)  ASSESSMENT: 79 year old male patient, s/p pacemaker, with COPD, and chronic CHF. He was admitted with shortness of breath and falls. He reports that he was eating well prior to admission, but has no appetite now. He reports intake of <25% of his breakfast this AM.   Palliative care consulted. GOC meeting with family scheduled for 4pm today.   Patient with a stage 3 decubitus sacrum ulcer.   Height: Ht Readings from Last 1 Encounters:  07/02/13 6\' 3"  (1.905 m)    Weight: Wt Readings from Last 1 Encounters:  07/04/13 184 lb 1.4 oz (83.5 kg)    Ideal Body Weight: 184 pounds  % Ideal Body Weight: 100%  Wt Readings from Last 10 Encounters:  07/04/13 184 lb 1.4 oz (83.5 kg)  06/26/13 202 lb 12.8 oz (91.989 kg)  06/08/13 207 lb (93.895 kg)  03/19/13 206 lb 12.8 oz (93.804 kg)  11/13/12 208 lb (94.348 kg)  10/06/12 214 lb (97.07 kg)  08/07/12 214 lb (97.07 kg)  06/30/12 209 lb (94.802 kg)  06/08/12 204 lb 1.9 oz (92.588 kg)  06/07/12 204 lb 12.8 oz (92.897 kg)    Usual Body Weight: Unknown  % Usual Body Weight:   BMI:  Body mass index is 23.01 kg/(m^2). Patient is normal weight.   Estimated Nutritional Needs: Kcal: 1450-1600 kcal Protein: 55-65 g Fluid: 1.5 L/day  Skin: Stage 3 decubitus ulcer, sacrum  Diet Order: Cardiac  EDUCATION NEEDS: -No education  needs identified at this time   Intake/Output Summary (Last 24 hours) at 07/04/13 1342 Last data filed at 07/04/13 0700  Gross per 24 hour  Intake    280 ml  Output   1100 ml  Net   -820 ml    Last BM: 11/26   Labs:   Recent Labs Lab 07/02/13 0038 07/02/13 1611 07/03/13 0615  NA  --  135 138  K  --  3.9 4.1  CL  --  96 98  CO2  --  28 31  BUN  --  17 15  CREATININE 1.04 0.97 1.07  CALCIUM  --  9.0 8.7  GLUCOSE  --  108* 84    CBG (last 3)  No results found for this basename: GLUCAP,  in the last 72 hours  Scheduled Meds: . Chlorhexidine Gluconate Cloth  6 each Topical Q0600  . collagenase   Topical Daily  . digoxin  125 mcg Oral Daily  . dorzolamide-timolol  1 drop Both Eyes Daily  . finasteride  5 mg Oral Daily  . furosemide  20 mg Oral BID  . heparin  5,000 Units Subcutaneous Q8H  . levothyroxine  150 mcg Oral QAC breakfast  . midodrine  10 mg Oral BID WC  . mupirocin ointment  1 application Nasal BID  . pyridostigmine  60 mg Oral TID WC  . sodium chloride  3 mL Intravenous Q12H  . sulfamethoxazole-trimethoprim  1 tablet Oral Q12H  . tamsulosin  0.4 mg Oral Daily    Continuous Infusions:   Past Medical History  Diagnosis Date  . Long-term (current) use of anticoagulants   . Glaucoma   . Gout   . Hyperthyroidism   . Orthostatic hypotension   . Redundant prepuce and phimosis   . Acute cystitis   . Hypertonicity of bladder   . HTN (hypertension)   . Atelectasis   . Dysautonomia   . Pacemaker- [facility] [employer]     DOI 2012  . CHF (congestive heart failure)     Previous preserved EF.  [name] Atrial fibrillation   . Sinus node dysfunction   . Pneumonia 08/2011    after hernia OR  . Hypothyroidism   . Sleep apnea     does not use cpap ; last used ~ 2011 (03/10/12)  . Spinal stenosis of lumbar region   . Chronic kidney disease [facility]     UTI  1 MO AGO TX MEDICALLY   . Osteomyelitis of toe of right foot ~ 2009    amputated 2nd toe    Past  Surgical History  Procedure Laterality Date  . Elbow bursa surgery  04/07/2006    Right elbow septic olecranon bursitis  . Toe amputation  ~ 2009    Osteomyelitis, right foot second toe /  Right foot second ray amputation  . Cardiac catheterization  07/03/2002    EF 60-70% /  Normal left ventricular function. / Very mild trivial three-vessel coronary atherosclerosis. / There is no mitral regurgitation noted  . Tonsillectomy and adenoidectomy      "when I was a youngster"  . Appendectomy      "when I was a youngster"  . Inguinal hernia repair  ~ 2003/13    right  . Cataract extraction w/ intraocular lens  implant, bilateral  01/2005    Linnell Fulling, RD, LDN Pager #: [phone] After-Hours Pager #: [phone]

## 2013-07-04 NOTE — Consult Note (Signed)
Patient WG:NFAOZHY G Strawser      DOB: [birth year]      QMV:[id number]  Summary of Goals of Care; full consult to follow:  Met with patient who has capacity for decision making. His daughter [name] and Surveyor, quantity .    Patient was able to tell his daughter that he desires to be a do not resuscitate.  I have provided a MOST for for further discussion among themselves about his goals.  Joyce's perspective started as "if what needs to be done to keep him alive can not be done here ,were want him transferred to a bigger hospital".  As we talked through the nature of his illness with his heart and how it is impacting on his body and then listened to his wishes , she is starting to see we may be facing accepting comfort care.  The plan is to continue his DNR status,  To elicit Dr. Yevonne Pax final words on the reversibility vs lack of reversibility vs intermittent reversibility of his cardiac symptoms which are driving his inability to move around and sleep in a bed (he has been sleeping in a recline for the past few months).  The patient himself was able to verbalize that he does not want to go to SNF for rehab no matter what .  He was open to talking about Hospice care which would permit him to potential be at home with his wife who is also ailing and currently is in SNF at [place].   We will talk with Dr. Patty Sermons ASAP, and bring together the news that Surgery is not advocating a surgical intervention unless sepsis is involved.   Total time:  415 pm-615 pm  Talk with on call Family Resident   Clemon Devaul L. [name], MD MBA The Palliative Medicine Team at [facility] Phone: [phone] Pager: [phone]

## 2013-07-04 NOTE — Consult Note (Signed)
Reason for Consult:Dr. Gwendolyn Grant Referring Physician: Sacral decubitus ulcer  Marco Gregory is an 78 y.o. male.  HPI: Patient was admitted 2 days ago with hypotension and heart failure.  Has had a sacral decubitus ulcer that has been receiving care as an outpatient and has been doing well the last time he was seen at the wound care center 2 weeks ago.  Was seen by [facility] service here where surgical consultation was recommended. The patient has a number of medical problems including CHF, chronic hypotension, myasthenia gravis, atrial fibrillation, CKD.  He has a history of chronic anticoagulation, but I do not see him on any anticoagulant currently.    Past Medical History  Diagnosis Date  . Long-term (current) use of anticoagulants   . Glaucoma   . Gout   . Hyperthyroidism   . Orthostatic hypotension   . Redundant prepuce and phimosis   . Acute cystitis   . Hypertonicity of bladder   . HTN (hypertension)   . Atelectasis   . Dysautonomia   . Pacemaker- [facility] [employer]     DOI 2012  . CHF (congestive heart failure)     Previous preserved EF.  [name] Atrial fibrillation   . Sinus node dysfunction   . Pneumonia 08/2011    after hernia OR  . Hypothyroidism   . Sleep apnea     does not use cpap ; last used ~ 2011 (03/10/12)  . Spinal stenosis of lumbar region   . Chronic kidney disease [facility]     UTI  1 MO AGO TX MEDICALLY   . Osteomyelitis of toe of right foot ~ 2009    amputated 2nd toe    Past Surgical History  Procedure Laterality Date  . Elbow bursa surgery  04/07/2006    Right elbow septic olecranon bursitis  . Toe amputation  ~ 2009    Osteomyelitis, right foot second toe /  Right foot second ray amputation  . Cardiac catheterization  07/03/2002    EF 60-70% /  Normal left ventricular function. / Very mild trivial three-vessel coronary atherosclerosis. / There is no mitral regurgitation noted  . Tonsillectomy and adenoidectomy      "when I was a youngster"  . Appendectomy       "when I was a youngster"  . Inguinal hernia repair  ~ 2003/13    right  . Cataract extraction w/ intraocular lens  implant, bilateral  01/2005    Family History  Problem Relation Age of Onset  . Cancer Mother 61    GYN    Social History:  reports that he quit smoking about 47 years ago. His smoking use included Cigarettes. He has a 25 pack-year smoking history. He has never used smokeless tobacco. He reports that he does not drink alcohol or use illicit drugs.  Allergies: No Known Allergies  Medications: I have reviewed the patient's current medications.  Results for orders placed during the hospital encounter of 07/02/13 (from the past 48 hour(s))  CBC WITH DIFFERENTIAL     Status: Abnormal   Collection Time    07/02/13  4:11 PM      Result Value Range   WBC 7.8  4.0 - 10.5 K/uL   RBC 3.63 (*) 4.22 - 5.81 MIL/uL   Hemoglobin 11.4 (*) 13.0 - 17.0 g/dL   HCT 40.9 (*) 81.1 - 91.4 %   MCV 92.0  78.0 - 100.0 fL   MCH 31.4  26.0 - 34.0 pg   MCHC 34.1  30.0 - 36.0 g/dL   RDW 78.2  95.6 - 21.3 %   Platelets 206  150 - 400 K/uL   Neutrophils Relative % 74  43 - 77 %   Neutro Abs 5.7  1.7 - 7.7 K/uL   Lymphocytes Relative 13  12 - 46 %   Lymphs Abs 1.0  0.7 - 4.0 K/uL   Monocytes Relative 13 (*) 3 - 12 %   Monocytes Absolute 1.0  0.1 - 1.0 K/uL   Eosinophils Relative 1  0 - 5 %   Eosinophils Absolute 0.1  0.0 - 0.7 K/uL   Basophils Relative 0  0 - 1 %   Basophils Absolute 0.0  0.0 - 0.1 K/uL  BASIC METABOLIC PANEL     Status: Abnormal   Collection Time    07/02/13  4:11 PM      Result Value Range   Sodium 135  135 - 145 mEq/L   Potassium 3.9  3.5 - 5.1 mEq/L   Chloride 96  96 - 112 mEq/L   CO2 28  19 - 32 mEq/L   Glucose, Bld 108 (*) 70 - 99 mg/dL   BUN 17  6 - 23 mg/dL   Creatinine, Ser 0.86  0.50 - 1.35 mg/dL   Calcium 9.0  8.4 - 57.8 mg/dL   GFR calc non Af Amer 71 (*) >90 mL/min   GFR calc Af Amer 82 (*) >90 mL/min   Comment: (NOTE)     The eGFR has been  calculated using the CKD EPI equation.     This calculation has not been validated in all clinical situations.     eGFR's persistently <90 mL/min signify possible Chronic Kidney     Disease.  PRO B NATRIURETIC PEPTIDE     Status: Abnormal   Collection Time    07/02/13  4:12 PM      Result Value Range   Pro B Natriuretic peptide (BNP) 2389.0 (*) 0 - 450 pg/mL  POCT I-STAT TROPONIN I     Status: None   Collection Time    07/02/13  4:27 PM      Result Value Range   Troponin i, poc 0.01  0.00 - 0.08 ng/mL   Comment 3            Comment: Due to the release kinetics of cTnI,     a negative result within the first hours     of the onset of symptoms does not rule out     myocardial infarction with certainty.     If myocardial infarction is still suspected,     repeat the test at appropriate intervals.  URINALYSIS, ROUTINE W REFLEX MICROSCOPIC     Status: Abnormal   Collection Time    07/02/13  5:39 PM      Result Value Range   Color, Urine AMBER (*) YELLOW   Comment: BIOCHEMICALS MAY BE AFFECTED BY COLOR   APPearance CLOUDY (*) CLEAR   Specific Gravity, Urine 1.018  1.005 - 1.030   pH 5.5  5.0 - 8.0   Glucose, UA NEGATIVE  NEGATIVE mg/dL   Hgb urine dipstick TRACE (*) NEGATIVE   Bilirubin Urine SMALL (*) NEGATIVE   Ketones, ur NEGATIVE  NEGATIVE mg/dL   Protein, ur NEGATIVE  NEGATIVE mg/dL   Urobilinogen, UA 1.0  0.0 - 1.0 mg/dL   Nitrite NEGATIVE  NEGATIVE   Leukocytes, UA MODERATE (*) NEGATIVE  URINE MICROSCOPIC-ADD ON     Status: Abnormal  Collection Time    07/02/13  5:39 PM      Result Value Range   WBC, UA 7-10  <3 WBC/hpf   RBC / HPF 0-2  <3 RBC/hpf   Bacteria, UA FEW (*) RARE   Urine-Other AMORPHOUS URATES/PHOSPHATES    URINE CULTURE     Status: None   Collection Time    07/02/13  5:39 PM      Result Value Range   Specimen Description URINE, CATHETERIZED     Special Requests NONE     Culture  Setup Time       Value: 07/02/2013 19:21     Performed at [facility]       Value: NO GROWTH     Performed at [facility]   Culture       Value: NO GROWTH     Performed at [facility]   Report Status 07/03/2013 FINAL    DIGOXIN LEVEL     Status: Abnormal   Collection Time    07/02/13  6:05 PM      Result Value Range   Digoxin Level 0.5 (*) 0.8 - 2.0 ng/mL  MRSA PCR SCREENING     Status: Abnormal   Collection Time    07/02/13 11:57 PM      Result Value Range   MRSA by PCR POSITIVE (*) NEGATIVE   Comment:            The GeneXpert MRSA Assay (FDA     approved for NASAL specimens     only), is one component of a     comprehensive MRSA colonization     surveillance program. It is not     intended to diagnose MRSA     infection nor to guide or     monitor treatment for     MRSA infections.     RESULT CALLED TO, READ BACK BY AND VERIFIED WITH:     HARAWAY,J RN [id number] 07/03/13 MITCHELL,L  COMPREHENSIVE METABOLIC PANEL     Status: Abnormal   Collection Time    07/03/13  6:15 AM      Result Value Range   Sodium 138  135 - 145 mEq/L   Potassium 4.1  3.5 - 5.1 mEq/L   Chloride 98  96 - 112 mEq/L   CO2 31  19 - 32 mEq/L   Glucose, Bld 84  70 - 99 mg/dL   BUN 15  6 - 23 mg/dL   Creatinine, Ser 3.24  0.50 - 1.35 mg/dL   Calcium 8.7  8.4 - 40.1 mg/dL   Total Protein 6.4  6.0 - 8.3 g/dL   Albumin 2.7 (*) 3.5 - 5.2 g/dL   AST 22  0 - 37 U/L   ALT 14  0 - 53 U/L   Alkaline Phosphatase 84  39 - 117 U/L   Total Bilirubin 0.9  0.3 - 1.2 mg/dL   GFR calc non Af Amer 59 (*) >90 mL/min   GFR calc Af Amer 68 (*) >90 mL/min   Comment: (NOTE)     The eGFR has been calculated using the CKD EPI equation.     This calculation has not been validated in all clinical situations.     eGFR's persistently <90 mL/min signify possible Chronic Kidney     Disease.  CBC     Status: Abnormal   Collection Time    07/03/13  6:15 AM  Result Value Range   WBC 7.8  4.0 - 10.5 K/uL   RBC 3.63 (*) 4.22 - 5.81 MIL/uL   Hemoglobin  11.3 (*) 13.0 - 17.0 g/dL   HCT 16.1 (*) 09.6 - 04.5 %   MCV 94.8  78.0 - 100.0 fL   MCH 31.1  26.0 - 34.0 pg   MCHC 32.8  30.0 - 36.0 g/dL   RDW 40.9  81.1 - 91.4 %   Platelets 224  150 - 400 K/uL    Dg Chest 2 View  07/02/2013   CLINICAL DATA:  Leg swelling, history of CHF  EXAM: CHEST  2 VIEW  COMPARISON:  Prior chest x-ray 06/07/2012  FINDINGS: Stable cardiomegaly. Atherosclerotic calcifications noted in the transverse aorta. Left subclavian approach cardiac rhythm maintenance device with leads projecting over the right atrium and right ventricle. Mild pulmonary vascular congestion, slightly progressed from prior but without overt edema. Elevation of left hemidiaphragm with mild lingular atelectasis. No pneumothorax or large pleural effusion. No acute osseous abnormality. Central bronchitic changes are similar compared to prior.  IMPRESSION: Mild pulmonary vascular congestion without overt edema.  Stable cardiomegaly.   Electronically Signed   By: Malachy Moan M.D.   On: 07/02/2013 17:22   Ct Head Wo Contrast  07/02/2013   CLINICAL DATA:  Left-sided neck pain, recent falls  EXAM: CT HEAD WITHOUT CONTRAST  CT CERVICAL SPINE WITHOUT CONTRAST  TECHNIQUE: Multidetector CT imaging of the head and cervical spine was performed following the standard protocol without intravenous contrast. Multiplanar CT image reconstructions of the cervical spine were also generated.  COMPARISON:  04/19/2012  FINDINGS: CT HEAD FINDINGS  Mild soft tissue changes are noted within the left maxillary antrum likely of a chronic nature. The bony calvarium is intact. Atrophic changes are identified as well as chronic white matter ischemic change. No findings to suggest acute hemorrhage, acute infarction or space-occupying mass lesion are noted.  CT CERVICAL SPINE FINDINGS  Seven cervical segments are well visualized. Vertebral body height is well maintained. Multilevel facet hypertrophic changes are noted. No acute fracture  or acute facet abnormality is noted. The surrounding soft tissues are within normal limits with the exception of carotid calcifications.  IMPRESSION: CT head:  Chronic changes without acute abnormality.  CT of the cervical spine: Degenerative change without acute abnormality.   Electronically Signed   By: Alcide Clever M.D.   On: 07/02/2013 19:07   Ct Cervical Spine Wo Contrast  07/02/2013   CLINICAL DATA:  Left-sided neck pain, recent falls  EXAM: CT HEAD WITHOUT CONTRAST  CT CERVICAL SPINE WITHOUT CONTRAST  TECHNIQUE: Multidetector CT imaging of the head and cervical spine was performed following the standard protocol without intravenous contrast. Multiplanar CT image reconstructions of the cervical spine were also generated.  COMPARISON:  04/19/2012  FINDINGS: CT HEAD FINDINGS  Mild soft tissue changes are noted within the left maxillary antrum likely of a chronic nature. The bony calvarium is intact. Atrophic changes are identified as well as chronic white matter ischemic change. No findings to suggest acute hemorrhage, acute infarction or space-occupying mass lesion are noted.  CT CERVICAL SPINE FINDINGS  Seven cervical segments are well visualized. Vertebral body height is well maintained. Multilevel facet hypertrophic changes are noted. No acute fracture or acute facet abnormality is noted. The surrounding soft tissues are within normal limits with the exception of carotid calcifications.  IMPRESSION: CT head:  Chronic changes without acute abnormality.  CT of the cervical spine: Degenerative change without acute  abnormality.   Electronically Signed   By: Alcide Clever M.D.   On: 07/02/2013 19:07    Review of Systems  Constitutional: Negative for fever.  Respiratory: Positive for shortness of breath.   Cardiovascular: Positive for orthopnea.  Skin: Negative for rash.   Blood pressure 99/58, pulse 73, temperature 97.5 F (36.4 C), temperature source Oral, resp. rate 20, height 6\' 3"  (1.905 m),  weight 83.5 kg (184 lb 1.4 oz), SpO2 96.00%. Physical Exam  Constitutional: He is oriented to person, place, and time. He appears well-developed and well-nourished.  HENT:  Head: Normocephalic and atraumatic.  Eyes: Conjunctivae are normal. Pupils are equal, round, and reactive to light.  GI: Soft. Bowel sounds are normal.  Genitourinary:     Musculoskeletal: Normal range of motion.  Neurological: He is alert and oriented to person, place, and time.  Very hard of hearing.  Skin: Skin is warm and dry.  Psychiatric: He has a normal mood and affect. His behavior is normal. Judgment and thought content normal.    Assessment/Plan: Stage III sacral decubitus ulcer Palliative care consultation has been requested to determine goals of care Will check PT/INR.  Will make sure that the patient has not been on any of the newer anticoagulants. Patient was eating when I came by for the consultation. Would not debride in the OR unless patient cleared by cardiology and sepsis was a concern.   Cherylynn Ridges 07/04/2013, 8:43 AM

## 2013-07-04 NOTE — Progress Notes (Signed)
Family Medicine Teaching Service Daily Progress Note Intern Pager: [phone]  Patient name: Marco Gregory Medical record number: [id number] Date of birth: 02-16-1921 Age: 79 y.o. Gender: male  Primary Care Provider: Elvina Sidle, MD Consultants: HF team Code Status: Full  Pt Overview and Major Events to Date:   Assessment and Plan: Marco Gregory is a 79 y.o. male with Paroxysmal atrial fibrillation s/p Pacemaker, Diastolic CHF, and COPD presents with worsening SOB and frequent falls.   # Dyspnea - Likely CHF exacerbation given elevated BNP (2389), rales on PE, and LE edema. Patient was given IV Lasix 40 mg in the ED. Long history of dysautonomia and low BP.  - Hgb stable at 11.3; BMET wnl on 11/25 - Echo: mild LV hypertrophy. EF 55%. Mildly dilated RV with normal systolic function. mild aortic stenosis - Elevate legs - Strict I/O's: OUP yesterday -1350cc (0.7cc/kg/hr); Wt 184 down 9 lbs from yesterday (202 admit) - Cardiology: Consulted to aid in diuresis (especially in the setting of hypotension)   Symptomatic treatment w/ Lasix  40 PO BID started  Palliative care consulted: mtg @ 4pm w/ daughter  # Falls - Have been worsening recently. Likely secondary to orthostasis and deconditioning/weakness.  - PT/OT: Rec 24 hr assistance vs SNF - SW consulted - Patient will very likely need SNF placement (Wife is at [facility])  # UTI - UA revealed moderate leukocytes. Patient endorsing some dysuria. Patient was given CTX in the ED.  - Cx: NG x 2days - No dysuria: UTI covered by cellulitis abx   # Paroxysmal atrial fibrillation  - Dig level subtheraputic on admit: 0.5 - Currently in Afib w/o RVR - Continuing home Digoxin   # LE ulcer and Sacral Decubitus ulcer (Stage 3)  - Wound care consulted; May need to image sacrum for Osteomyelitis or broaden abx to cover Group A if spikes fever - WBC wnl on 11/25 - ABI ordered; discuss boot with wound care pending results -  Bactrim: day 2   - Surgery consult for discussion of Sacral Ulcer debridement  # Dysautonomia and hypotension  - Continuing home Midodrine  - Holding Florinef in the setting of diuresis as it increases sodium reabsorption (increasing volume)   # Hypothyroidism  - Continuing home synthroid   # ? Myasthenia  - This is documented in the medical record but per patient and daughter, they are unaware of this condition.  - Will continue [place].  - Will attempt to contact Neurologist (Dr. Sandria Manly) who has seen this patient in the past.   FEN/GI: Saline Lock IV; Heart Healthy Diet  Prophylaxis: Heparin SQ   Disposition: Pending clinical improvement; PT/OT assessment and will likely need placement given frequent falls.   Subjective:  Reports pain in his back and neck. Denies CP, and says his SOB is improved.    Objective: Temp:  [97.5 F (36.4 C)-98.3 F (36.8 C)] 97.5 F (36.4 C) (11/26 0617) Pulse Rate:  [65-73] 73 (11/26 0617) Resp:  [18-20] 20 (11/26 0617) BP: (99-138)/(54-69) 99/58 mmHg (11/26 0617) SpO2:  [95 %-96 %] 96 % (11/26 0617) Weight:  [184 lb 1.4 oz (83.5 kg)] 184 lb 1.4 oz (83.5 kg) (11/26 0617)  Physical Exam: General: chronically ill appearing elderly gentleman. NAD.  Cardiovascular: Irregularly, irregular. 2/6 systolic murmur noted.  Respiratory: Bibasilar rales noted. No wheezing noted on exam.  Extremities: Lower extremities red and warm to touch. ~4 cm circular wound noted on the left lateral lower leg; Purpura also noted on left lower leg.  Neuro: AO x 3.    Laboratory:  Recent Labs Lab 07/02/13 0038 07/02/13 1611 07/03/13 0615  WBC 7.3 7.8 7.8  HGB 11.1* 11.4* 11.3*  HCT 33.5* 33.4* 34.4*  PLT 211 206 224    Recent Labs Lab 07/02/13 0038 07/02/13 1611 07/03/13 0615  NA  --  135 138  K  --  3.9 4.1  CL  --  96 98  CO2  --  28 31  BUN  --  17 15  CREATININE 1.04 0.97 1.07  CALCIUM  --  9.0 8.7  PROT  --   --  6.4  BILITOT  --   --  0.9   ALKPHOS  --   --  84  ALT  --   --  14  AST  --   --  22  GLUCOSE  --  108* 84   Imaging/Diagnostic Tests: Dg Chest 2 View  07/02/2013 IMPRESSION: Mild pulmonary vascular congestion without overt edema. Stable cardiomegaly.   Ct Head Wo Contrast  07/02/2013 IMPRESSION: CT head: Chronic changes without acute abnormality. CT of the cervical spine: Degenerative change without acute abnormality.   Ct Cervical Spine Wo Contrast  07/02/2013 IMPRESSION: CT head: Chronic changes without acute abnormality. CT of the cervical spine: Degenerative change without acute abnormality.   EKG: RBBB and LAFB, A fib (rate controlled)  Wenda Low, MD 07/04/2013, 7:06 AM PGY-1, [place] [facility] Family Medicine FPTS Intern pager: [phone], text pages welcome

## 2013-07-04 NOTE — Telephone Encounter (Signed)
New problem    Need to speak with Juliette Alcide concerns her father care.

## 2013-07-04 NOTE — Progress Notes (Signed)
The patient only complained of heartburn overnight, which he says that he gets frequently.  The MD was notified and ordered TUMS.  The patient had relief after taking it.  He did not want to eat even after encouragement earlier in the shift and he slept for most of the night.  He did not have any acute changes.

## 2013-07-05 DIAGNOSIS — L8993 Pressure ulcer of unspecified site, stage 3: Secondary | ICD-10-CM

## 2013-07-05 DIAGNOSIS — L89109 Pressure ulcer of unspecified part of back, unspecified stage: Secondary | ICD-10-CM

## 2013-07-05 DIAGNOSIS — L8995 Pressure ulcer of unspecified site, unstageable: Secondary | ICD-10-CM

## 2013-07-05 LAB — BASIC METABOLIC PANEL
CO2: 31 mEq/L (ref 19–32)
Creatinine, Ser: 1.12 mg/dL (ref 0.50–1.35)
Glucose, Bld: 104 mg/dL — ABNORMAL HIGH (ref 70–99)
Potassium: 3.4 mEq/L — ABNORMAL LOW (ref 3.5–5.1)
Sodium: 136 mEq/L (ref 135–145)

## 2013-07-05 LAB — CBC
HCT: 35.7 % — ABNORMAL LOW (ref 39.0–52.0)
Hemoglobin: 11.6 g/dL — ABNORMAL LOW (ref 13.0–17.0)
RBC: 3.79 MIL/uL — ABNORMAL LOW (ref 4.22–5.81)

## 2013-07-05 MED ORDER — IBUPROFEN 400 MG PO TABS
400.0000 mg | ORAL_TABLET | Freq: Four times a day (QID) | ORAL | Status: DC | PRN
  Administered 2013-07-06: 400 mg via ORAL
  Filled 2013-07-05: qty 1

## 2013-07-05 MED ORDER — MIDODRINE HCL 5 MG PO TABS
10.0000 mg | ORAL_TABLET | Freq: Three times a day (TID) | ORAL | Status: DC
  Administered 2013-07-05 – 2013-07-12 (×21): 10 mg via ORAL
  Filled 2013-07-05 (×24): qty 2

## 2013-07-05 MED ORDER — HYDROCODONE-ACETAMINOPHEN 5-325 MG PO TABS
1.0000 | ORAL_TABLET | Freq: Four times a day (QID) | ORAL | Status: DC | PRN
  Administered 2013-07-07 – 2013-07-08 (×2): 1 via ORAL
  Filled 2013-07-05 (×2): qty 1

## 2013-07-05 MED ORDER — POTASSIUM CHLORIDE CRYS ER 20 MEQ PO TBCR
20.0000 meq | EXTENDED_RELEASE_TABLET | Freq: Three times a day (TID) | ORAL | Status: AC
  Administered 2013-07-05 (×3): 20 meq via ORAL
  Filled 2013-07-05 (×3): qty 1

## 2013-07-05 NOTE — Progress Notes (Signed)
Ambulated Pt 39ft on RA. Pt tolerated well.

## 2013-07-05 NOTE — Progress Notes (Signed)
Patient ID: Marco Gregory, male   DOB: 16-Jun-1921, 79 y.o.   MRN: [id number]  Subjective: Pt resting comfortably.  Palliative care on board, family and patient wish to proceed with surgery.  Objective:  Vital signs:  Filed Vitals:   07/04/13 1140 07/04/13 1430 07/04/13 2012 07/05/13 0616  BP:  120/54 91/52 109/64  Pulse: 72 70 64 62  Temp:  97.4 F (36.3 C) 97.1 F (36.2 C) 97.6 F (36.4 C)  TempSrc:  Oral Oral Oral  Resp:  18 18 19   Height:      Weight:    188 lb 4.4 oz (85.4 kg)  SpO2:  94% 95% 95%    Last BM Date: 07/04/13  Intake/Output   Yesterday:  11/26 0701 - 11/27 0700 In: 360 [P.O.:360] Out: 625 [Urine:625] This shift:     Physical Exam: General: Pt awake/alert/oriented x3 in no acute distress Wound: approx 5x4cm decubitus ulcer to coccyx, yellow slough, no eschar.    Problem List:   Principal Problem:   Acute exacerbation of CHF (congestive heart failure) Active Problems:   DYSAUTONOMIA   Orthostasis   Myasthenia gravis   PAF (paroxysmal atrial fibrillation)   Skin ulcer   Decubitus ulcer of sacral region, stage 3   Fall at home    Results:   Labs: Results for orders placed during the hospital encounter of 07/02/13 (from the past 48 hour(s))  PROTIME-INR     Status: None   Collection Time    07/04/13 10:32 AM      Result Value Range   Prothrombin Time 15.2  11.6 - 15.2 seconds   INR 1.23  0.00 - 1.49  CBC     Status: Abnormal   Collection Time    07/05/13  5:40 AM      Result Value Range   WBC 8.9  4.0 - 10.5 K/uL   RBC 3.79 (*) 4.22 - 5.81 MIL/uL   Hemoglobin 11.6 (*) 13.0 - 17.0 g/dL   HCT 14.7 (*) 82.9 - 56.2 %   MCV 94.2  78.0 - 100.0 fL   MCH 30.6  26.0 - 34.0 pg   MCHC 32.5  30.0 - 36.0 g/dL   RDW 13.0  86.5 - 78.4 %   Platelets 249  150 - 400 K/uL  BASIC METABOLIC PANEL     Status: Abnormal   Collection Time    07/05/13  5:40 AM      Result Value Range   Sodium 136  135 - 145 mEq/L   Potassium 3.4 (*) 3.5 - 5.1  mEq/L   Comment: DELTA CHECK NOTED   Chloride 98  96 - 112 mEq/L   CO2 31  19 - 32 mEq/L   Glucose, Bld 104 (*) 70 - 99 mg/dL   BUN 19  6 - 23 mg/dL   Creatinine, Ser 6.96  0.50 - 1.35 mg/dL   Calcium 8.7  8.4 - 29.5 mg/dL   GFR calc non Af Amer 56 (*) >90 mL/min   GFR calc Af Amer 65 (*) >90 mL/min   Comment: (NOTE)     The eGFR has been calculated using the CKD EPI equation.     This calculation has not been validated in all clinical situations.     eGFR's persistently <90 mL/min signify possible Chronic Kidney     Disease.    Imaging / Studies: No results found.  Medications / Allergies: per chart  Antibiotics: Anti-infectives   Start     Dose/Rate Route  Frequency Ordered Stop   07/03/13 1500  sulfamethoxazole-trimethoprim (BACTRIM DS) 800-160 MG per tablet 1 tablet     1 tablet Oral Every 12 hours 07/03/13 1427     07/03/13 1245  sulfamethoxazole-trimethoprim (BACTRIM DS) 800-160 MG per tablet 1 tablet  Status:  Discontinued     1 tablet Oral Every 12 hours 07/03/13 1243 07/03/13 1427   07/02/13 1930  cefTRIAXone (ROCEPHIN) 1 g in dextrose 5 % 50 mL IVPB     1 g 100 mL/hr over 30 Minutes Intravenous  Once 07/02/13 1920 07/02/13 2050      Assessment/Plan Stage III sacral decubitus ulcer Bedside debridement and hydrotherapy versus OR for debridement.  We would need cardiac clearance prior to surgical intervention and the patient has eaten this morning.  Will discuss with Dr. Carolynne Edouard further plans.    Ashok Norris, [facility] Pager [phone] Office [phone]  07/05/2013  9:00 AM

## 2013-07-05 NOTE — Progress Notes (Signed)
Agree with Emily Miliano RN BSN notes, assessment and charting,.  Signing off 

## 2013-07-05 NOTE — Progress Notes (Addendum)
Pt up in chair. Daughter in room talking to Pt. Daughter stated she wanted Pt to go to [facility] or [facility] if he was d/c Friday, Daughter stated she feels that more could be done. Daughter appears anxious over Pt  Condition. RN suggested that she talked to MD Brackbill over concerns and ask about the CHF team here at [facility]. Will continue to monitor Pt

## 2013-07-05 NOTE — Progress Notes (Signed)
Patient YN:Marco Gregory      DOB: 1921-04-01      HYQ:[id number]   Palliative Medicine Team at [facility] Progress Note    Subjective: Patient was trying to fall off to sleep when I arrived. He opened his eyes and stated he was doing fine. He was noted to have a slight wet cough. I elevated his head to assist in clearing his throat. He stated the new bed was comfortable for his back and bottom. He denied respiratory distress. Updated his daughter [name] via phone. She has requested a heart failure consult versus transferred to another hospital for further evaluation of his heart failure.    Filed Vitals:   07/05/13 1444  BP: 107/53  Pulse: 60  Temp: 97.8 F (36.6 C)  Resp: 18   Physical exam:  Gen.: no acute distress, slight wet cough Pupils are equal round and reactive to light, mucous membranes are dry Chest: upper tracheal wheezing, clears with cough, some basilar crackles anteriorly Cardiovascular: regular, rate and rhythm, S 1, S 2 don't appreciate murmur in current position abdomen: soft tender distended, positive bowel sounds Extremities: multiple red lesions, wrinkles consistent with decreased edema Neurologic: awakens easily but is fatigued   Lab Results  Component Value Date   CREATININE 1.12 07/05/2013   BUN 19 07/05/2013   NA 136 07/05/2013   K 3.4* 07/05/2013   CL 98 07/05/2013   CO2 31 07/05/2013   Lab Results  Component Value Date   WBC 8.9 07/05/2013   HGB 11.6* 07/05/2013   HCT 35.7* 07/05/2013   MCV 94.2 07/05/2013   PLT 249 07/05/2013       Assessment and plan:79 year old white male with exacerbation of diastolic heart failure, hypotension secondary to autonomic dysfunction making it difficult to maximize his diuresis. Patient has failure to thrive with a large secret decubitus secondary  to sitting in his recliner excessively over the last several weeks. Them asserting that they desire a second opinion from heart failure or another  facility despite conversations with his primary cardiology team today.  1. Do not resuscitate 2. Maximize diuresis and cardiac medications. Family requesting heart failure evaluation. 3. Sacral decubitus-surgery has weighed in. Cardiology stating patient likely able to tolerate surgical intervention if necessary. Family and patient asserting a desire standard of care best treatment for his wound. Your mattress overlay appears to be making him more comfortable.  Disposition: family considering home with hospice. Please make care management consult to offer choice when medically stable for discharge.  Total time 25 minutes: counseling and coordination of care greater than 50% of this time.  Keondrick Dilks L. [name], MD MBA The Palliative Medicine Team at [facility] Phone: [phone] Pager: [phone]

## 2013-07-05 NOTE — Progress Notes (Signed)
FMTS Attending  Note: Meryle Pugmire,MD I  have seen and examined this patient, reviewed their chart. I have discussed this patient with the resident. I agree with the resident's findings, assessment and care plan.  

## 2013-07-05 NOTE — Progress Notes (Signed)
General surgery attending note:  I have interviewed and examined this patient this morning. I agree with the assessment and treatment plan outlined by Ms. Riebock, NP.[name]  Basically the patient has superficial bilateral sacral decubitus ulcer. There is full thickness epidermal and dermal slough but this is clean. The deeper  tissues are soft. There is no cellulitis. There is no abscess. This has cleaned up somewhat with Santyl ointment.  If we are to debride this in the OR it would need to be done under general anesthesia. We will need cardiac clearance for this. Will check with Dr. Patty Sermons.  We're certainly willing to do this if cardiac risk assessment is favorable. If it is not, then continued enzymatic therapy would be a reasonable alternative, given the absence of any source of sepsis.   Angelia Mould. Derrell Lolling, M.D., [facility] General and Minimally invasive Surgery Breast and Colorectal Surgery Office:   [phone] Pager:   [phone]

## 2013-07-05 NOTE — Progress Notes (Signed)
Family Medicine Teaching Service Daily Progress Note Intern Pager: [phone]  Patient name: Marco Gregory Medical record number: [id number] Date of birth: [birth year] Age: 79 y.o. Gender: male  Primary Care Provider: Elvina Sidle, MD Consultants: Cardiology, Surgery Code Status: DNR  Pt Overview and Major Events to Date:   Assessment and Plan: Marco Gregory is a 79 y.o. male with Paroxysmal atrial fibrillation s/p Pacemaker, Diastolic CHF, and COPD presents with worsening SOB and frequent falls.   # Dyspnea - Likely CHF exacerbation given elevated BNP (2389), rales on PE, and LE edema. Patient was given IV Lasix 40 mg in the ED. Long history of dysautonomia and low BP.  - Hgb stable at 11.3; BMET wnl on 11/25 - Echo: mild LV hypertrophy. EF 55%. Mildly dilated RV with normal systolic function. mild aortic stenosis - Elevate legs - Strict I/O's: OUP yesterday -625cc (0.3cc/kg/hr); Wt 188 up 4 lbs from yesterday (202 admit) - Cardiology: Consulted to aid in diuresis (especially in the setting of hypotension)   Symptomatic treatment w/ Lasix: 20 PO BID   Palliative care consulted: Given info on hospice   # Falls - Have been worsening recently. Likely secondary to orthostasis and deconditioning/weakness.  - PT/OT: Rec 24 hr assistance vs SNF - SW and palliative consulted - Patient wishes to go home; hospice as a possibility (Wife is at [facility])  # UTI - UA revealed moderate leukocytes. Patient endorsing some dysuria. Patient was given CTX in the ED.  - Cx: NG x 3days - No dysuria: UTI covered by cellulitis abx   # Paroxysmal atrial fibrillation  - Dig level subtheraputic on admit: 0.5 - Currently in Afib w/o RVR - Continuing home Digoxin   # LE ulcer and Sacral Decubitus ulcer (Stage 3)  - Wound care consulted; May need to image sacrum for Osteomyelitis or broaden abx to cover Group A if spikes fever - WBC wnl on 11/25 - ABI ordered; discuss boot with wound  care pending results - Bactrim: day 3  - Surgery consult for discussion of Sacral Ulcer debridement  No surgery unless becomes septic   # Dysautonomia and hypotension  - discontinued Florinef in the setting of diuresis as it increases sodium reabsorption (increasing volume)  - [employer] Midodrine to TID dosing   # Hypothyroidism  - Continuing home synthroid   # ? Myasthenia  - This is documented in the medical record but per patient and daughter, they are unaware of this condition.  - Will continue Mesthinon.   FEN/GI: Saline Lock IV; Heart Healthy Diet  Prophylaxis: Heparin SQ   Disposition: Pending clinical improvement and palliative care recs  Subjective: Reports pain in his back and neck. Denies CP, and says his SOB is improved.    Objective: Temp:  [97.1 F (36.2 C)-97.6 F (36.4 C)] 97.6 F (36.4 C) (11/27 0616) Pulse Rate:  [62-72] 62 (11/27 0616) Resp:  [18-19] 19 (11/27 0616) BP: (91-120)/(52-64) 109/64 mmHg (11/27 0616) SpO2:  [94 %-95 %] 95 % (11/27 0616) Weight:  [188 lb 4.4 oz (85.4 kg)] 188 lb 4.4 oz (85.4 kg) (11/27 1478)  Physical Exam: General: elderly gentleman. NAD.  Cardiovascular: Irregularly, irregular. 2/6 systolic murmur noted.  Respiratory: Bibasilar rales noted w/ wheezing noted on exam.  Extremities: Lower extremities ~4 cm circular wound noted on the left lateral lower leg; Purpura also noted on left lower leg.  Neuro: A&O x 3.    Laboratory:  Recent Labs Lab 07/02/13 1611 07/03/13 0615 07/05/13 0540  WBC  7.8 7.8 8.9  HGB 11.4* 11.3* 11.6*  HCT 33.4* 34.4* 35.7*  PLT 206 224 249    Recent Labs Lab 07/02/13 1611 07/03/13 0615 07/05/13 0540  NA 135 138 136  K 3.9 4.1 3.4*  CL 96 98 98  CO2 28 31 31   BUN 17 15 19   CREATININE 0.97 1.07 1.12  CALCIUM 9.0 8.7 8.7  PROT  --  6.4  --   BILITOT  --  0.9  --   ALKPHOS  --  84  --   ALT  --  14  --   AST  --  22  --   GLUCOSE 108* 84 104*   Imaging/Diagnostic Tests: Dg  Chest 2 View  07/02/2013 IMPRESSION: Mild pulmonary vascular congestion without overt edema. Stable cardiomegaly.   Ct Head Wo Contrast  07/02/2013 IMPRESSION: CT head: Chronic changes without acute abnormality. CT of the cervical spine: Degenerative change without acute abnormality.   Ct Cervical Spine Wo Contrast  07/02/2013 IMPRESSION: CT head: Chronic changes without acute abnormality. CT of the cervical spine: Degenerative change without acute abnormality.   EKG: RBBB and LAFB, A fib (rate controlled)  Wenda Low, MD 07/05/2013, 8:49 AM PGY-1, [facility] Family Medicine FPTS Intern pager: [phone], text pages welcome

## 2013-07-05 NOTE — Progress Notes (Signed)
Move Pt to Chair 2x, BSC 1x. Family visiting, Pt request to do ambulation at a later time.

## 2013-07-05 NOTE — Progress Notes (Signed)
Patient Name: Marco Gregory Date of Encounter: 07/05/2013     Principal Problem:   Acute exacerbation of CHF (congestive heart failure) Active Problems:   DYSAUTONOMIA   Orthostasis   Myasthenia gravis   PAF (paroxysmal atrial fibrillation)   Skin ulcer   Decubitus ulcer of sacral region, stage 3   Fall at home    SUBJECTIVE  More alert this am. Asking a lot of questions about post-hospital care. He appreciated palliative care visit yesterday. Has not been out of bed yet because of orthostatic hypotension. No chest pain. Mild dyspnea at rest.  CURRENT MEDS . Chlorhexidine Gluconate Cloth  6 each Topical Q0600  . collagenase   Topical Daily  . digoxin  125 mcg Oral Daily  . dorzolamide-timolol  1 drop Both Eyes Daily  . feeding supplement (PRO-STAT SUGAR FREE 64)  30 mL Oral TID WC  . finasteride  5 mg Oral Daily  . furosemide  20 mg Oral BID  . heparin  5,000 Units Subcutaneous Q8H  . levothyroxine  150 mcg Oral QAC breakfast  . midodrine  10 mg Oral BID WC  . mupirocin ointment  1 application Nasal BID  . pyridostigmine  60 mg Oral TID WC  . sodium chloride  3 mL Intravenous Q12H  . sulfamethoxazole-trimethoprim  1 tablet Oral Q12H  . tamsulosin  0.4 mg Oral Daily    OBJECTIVE  Filed Vitals:   07/04/13 1140 07/04/13 1430 07/04/13 2012 07/05/13 0616  BP:  120/54 91/52 109/64  Pulse: 72 70 64 62  Temp:  97.4 F (36.3 C) 97.1 F (36.2 C) 97.6 F (36.4 C)  TempSrc:  Oral Oral Oral  Resp:  18 18 19   Height:      Weight:    188 lb 4.4 oz (85.4 kg)  SpO2:  94% 95% 95%    Intake/Output Summary (Last 24 hours) at 07/05/13 0815 Last data filed at 07/05/13 0617  Gross per 24 hour  Intake    360 ml  Output    625 ml  Net   -265 ml   Filed Weights   07/03/13 0221 07/04/13 0617 07/05/13 0616  Weight: 195 lb 1.7 oz (88.5 kg) 184 lb 1.4 oz (83.5 kg) 188 lb 4.4 oz (85.4 kg)    PHYSICAL EXAM  General: Pleasant, NAD. Neuro: Alert and oriented X 3.  Moves all extremities spontaneously. Psych: Normal affect. HEENT:  Normal  Neck: Supple without bruits or JVD. Lungs: Mild expiratory wheezing. Heart: Grade 2/6 systolic murmur. Paced regular rhythm Abdomen: Soft, non-tender, non-distended, BS + x 4.  Extremities: Mild pretibial edema. Covered ulcer left leg. Unusual ring-like ecchymotic bruise right lower leg.  Accessory Clinical Findings  CBC  Recent Labs  07/02/13 1611 07/03/13 0615 07/05/13 0540  WBC 7.8 7.8 8.9  NEUTROABS 5.7  --   --   HGB 11.4* 11.3* 11.6*  HCT 33.4* 34.4* 35.7*  MCV 92.0 94.8 94.2  PLT 206 224 249   Basic Metabolic Panel  Recent Labs  07/03/13 0615 07/05/13 0540  NA 138 136  K 4.1 3.4*  CL 98 98  CO2 31 31  GLUCOSE 84 104*  BUN 15 19  CREATININE 1.07 1.12  CALCIUM 8.7 8.7   Liver Function Tests  Recent Labs  07/03/13 0615  AST 22  ALT 14  ALKPHOS 84  BILITOT 0.9  PROT 6.4  ALBUMIN 2.7*   No results found for this basename: LIPASE, AMYLASE,  in the last 72 hours Cardiac  Enzymes No results found for this basename: CKTOTAL, CKMB, CKMBINDEX, TROPONINI,  in the last 72 hours BNP No components found with this basename: POCBNP,  D-Dimer No results found for this basename: DDIMER,  in the last 72 hours Hemoglobin A1C No results found for this basename: HGBA1C,  in the last 72 hours Fasting Lipid Panel No results found for this basename: CHOL, HDL, LDLCALC, TRIG, CHOLHDL, LDLDIRECT,  in the last 72 hours Thyroid Function Tests No results found for this basename: TSH, T4TOTAL, FREET3, T3FREE, THYROIDAB,  in the last 72 hours  TELE  V-paced.  ECG  Vpaced rhythm  Radiology/Studies  Dg Chest 2 View  07/02/2013   CLINICAL DATA:  Leg swelling, history of CHF  EXAM: CHEST  2 VIEW  COMPARISON:  Prior chest x-ray 06/07/2012  FINDINGS: Stable cardiomegaly. Atherosclerotic calcifications noted in the transverse aorta. Left subclavian approach cardiac rhythm maintenance device with  leads projecting over the right atrium and right ventricle. Mild pulmonary vascular congestion, slightly progressed from prior but without overt edema. Elevation of left hemidiaphragm with mild lingular atelectasis. No pneumothorax or large pleural effusion. No acute osseous abnormality. Central bronchitic changes are similar compared to prior.  IMPRESSION: Mild pulmonary vascular congestion without overt edema.  Stable cardiomegaly.   Electronically Signed   By: Malachy Moan M.D.   On: 07/02/2013 17:22   Ct Head Wo Contrast  07/02/2013   CLINICAL DATA:  Left-sided neck pain, recent falls  EXAM: CT HEAD WITHOUT CONTRAST  CT CERVICAL SPINE WITHOUT CONTRAST  TECHNIQUE: Multidetector CT imaging of the head and cervical spine was performed following the standard protocol without intravenous contrast. Multiplanar CT image reconstructions of the cervical spine were also generated.  COMPARISON:  04/19/2012  FINDINGS: CT HEAD FINDINGS  Mild soft tissue changes are noted within the left maxillary antrum likely of a chronic nature. The bony calvarium is intact. Atrophic changes are identified as well as chronic white matter ischemic change. No findings to suggest acute hemorrhage, acute infarction or space-occupying mass lesion are noted.  CT CERVICAL SPINE FINDINGS  Seven cervical segments are well visualized. Vertebral body height is well maintained. Multilevel facet hypertrophic changes are noted. No acute fracture or acute facet abnormality is noted. The surrounding soft tissues are within normal limits with the exception of carotid calcifications.  IMPRESSION: CT head:  Chronic changes without acute abnormality.  CT of the cervical spine: Degenerative change without acute abnormality.   Electronically Signed   By: Alcide Clever M.D.   On: 07/02/2013 19:07   Ct Cervical Spine Wo Contrast  07/02/2013   CLINICAL DATA:  Left-sided neck pain, recent falls  EXAM: CT HEAD WITHOUT CONTRAST  CT CERVICAL SPINE WITHOUT  CONTRAST  TECHNIQUE: Multidetector CT imaging of the head and cervical spine was performed following the standard protocol without intravenous contrast. Multiplanar CT image reconstructions of the cervical spine were also generated.  COMPARISON:  04/19/2012  FINDINGS: CT HEAD FINDINGS  Mild soft tissue changes are noted within the left maxillary antrum likely of a chronic nature. The bony calvarium is intact. Atrophic changes are identified as well as chronic white matter ischemic change. No findings to suggest acute hemorrhage, acute infarction or space-occupying mass lesion are noted.  CT CERVICAL SPINE FINDINGS  Seven cervical segments are well visualized. Vertebral body height is well maintained. Multilevel facet hypertrophic changes are noted. No acute fracture or acute facet abnormality is noted. The surrounding soft tissues are within normal limits with the exception of carotid  calcifications.  IMPRESSION: CT head:  Chronic changes without acute abnormality.  CT of the cervical spine: Degenerative change without acute abnormality.   Electronically Signed   By: Alcide Clever M.D.   On: 07/02/2013 19:07  2D echo 07/03/13:  - Left ventricle: The cavity size was normal. Wall thickness was increased in a pattern of mild LVH. The estimated ejection fraction was 55%. Wall motion was normal; there were no regional wall motion abnormalities. Indeterminant diastolic function (atrial fibrillation). - Aortic valve: Trileaflet; moderately calcified leaflets. There was mild stenosis. Trivial regurgitation. Mean gradient: 16mm Hg (S). Peak gradient: 27mm Hg (S). - Aorta: Mildly dilated aortic root. Aortic root dimension: 39mm (ED). - Mitral valve: Mildly calcified annulus. Normal thickness leaflets . No significant regurgitation. - Left atrium: The atrium was moderately dilated. - Right ventricle: The cavity size was mildly dilated. It is not well-visualized, but I think there is a pacemaker in the RV.  Systolic function was normal. - Right atrium: The atrium was moderately dilated. - Tricuspid valve: Peak RV-RA gradient:78mm Hg (S). - Pulmonary arteries: PA peak pressure: 54mm Hg (S). - Systemic veins: IVC not visualized. Impressions:  - The patient was in atrial fibrillation. Normal LV size with mild LV hypertrophy. EF 55%. Mildly dilated RV with normal systolic function. There was mild aortic stenosis. Transthoracic echocardiography. M-mode, complete 2D,    ASSESSMENT AND PLAN 1. CHF:  Acute on chronic diastolic CHF. Aggressive diuresis limited by his orthostatic hypotension.  2. Autonomic dysfunction with hypotension: As noted above in #1. BP low but stable for now. Will have to monitor closely given his diuresis. Continue  Midodrine. Will increase midodrine to TID.  3. Paroxsymal atrial fibrillation: Pt on Digoxin at home and has a pacemaker. His Coumadin was stopped about 1 year ago. . The digoxin was continued on admission, and appears to be sub therapeutic; dosing per Pharmacy. His rhythm is a paced rhythm.  4. ? Myasthenia gravis: Pt is on Mestinon at home. Per admission note, the pt and his daughter deny any diagnosis of MG. Mestinon continued on admission  Plan: Continue current lasix 20 mg BID. Increase midodrine. Try to get him out of bed if BP stable enough. I spoke with patient and later by phone with daughter [name]. Patient adamant about not going to a nursing home. Best approach would appear to be eventually home with hospice nurse and round the clock care.  Signed, Cassell Clement MD

## 2013-07-05 NOTE — Progress Notes (Signed)
Dr. Derrell Lolling has raised question of operative risk for debridement of decubitus in OR using general anesthesia in prone position for about 30-45 minutes.  He has good LV function and a functioning pacemaker. His operative risk would not be excessive.

## 2013-07-06 DIAGNOSIS — I5033 Acute on chronic diastolic (congestive) heart failure: Principal | ICD-10-CM

## 2013-07-06 DIAGNOSIS — M79609 Pain in unspecified limb: Secondary | ICD-10-CM

## 2013-07-06 LAB — BASIC METABOLIC PANEL
BUN: 19 mg/dL (ref 6–23)
BUN: 21 mg/dL (ref 6–23)
CO2: 31 mEq/L (ref 19–32)
Calcium: 8.8 mg/dL (ref 8.4–10.5)
Creatinine, Ser: 1.17 mg/dL (ref 0.50–1.35)
Creatinine, Ser: 1.12 mg/dL (ref 0.50–1.35)
GFR calc non Af Amer: 53 mL/min — ABNORMAL LOW (ref >90)
GFR calc non Af Amer: 56 mL/min — ABNORMAL LOW (ref >90)
Glucose, Bld: 100 mg/dL — ABNORMAL HIGH (ref 70–99)
Glucose, Bld: 119 mg/dL — ABNORMAL HIGH (ref 70–99)
Potassium: 4.6 mEq/L (ref 3.5–5.1)

## 2013-07-06 LAB — CBC
HCT: 35.8 % — ABNORMAL LOW (ref 39.0–52.0)
Hemoglobin: 11.8 g/dL — ABNORMAL LOW (ref 13.0–17.0)
MCH: 30.8 pg (ref 26.0–34.0)
MCHC: 33 g/dL (ref 30.0–36.0)
MCV: 93.5 fL (ref 78.0–100.0)
RBC: 3.83 MIL/uL — ABNORMAL LOW (ref 4.22–5.81)
RDW: 13.6 % (ref 11.5–15.5)

## 2013-07-06 MED ORDER — FUROSEMIDE 10 MG/ML IJ SOLN
40.0000 mg | Freq: Two times a day (BID) | INTRAMUSCULAR | Status: DC
  Administered 2013-07-06: 40 mg via INTRAVENOUS

## 2013-07-06 NOTE — Progress Notes (Signed)
Agree with Emily Miliano RN BSN assessment and notes signing off   

## 2013-07-06 NOTE — Progress Notes (Signed)
Air mattress overlay added to patient's bed on 07/05/13 after daily weight taken.  Patient weighed standing up on scale A this AM.

## 2013-07-06 NOTE — Progress Notes (Signed)
Dr. Lubertha Basque note seen.  I have asked heart failure team to see him.

## 2013-07-06 NOTE — Care Management Note (Addendum)
   CARE MANAGEMENT NOTE 07/06/2013  Patient:  Marco Gregory, Marco Gregory   Account Number:  [id number]  Date Initiated:  07/06/2013  Documentation initiated by:  Kippy Gohman  Subjective/Objective Assessment:   Order for [facility] and hospice choices, however this family has decided that they do not wish hospice services at this time, but would like [facility] services and DME as pt wishes to have HHPT and HHOT.     Action/Plan:   Met with pt and daughter, they wish to use [facility] and will place list of DME needs in chart for MD to order as requested.   Anticipated DC Date:     Anticipated DC Plan:  HOME W HOME HEALTH SERVICES         Choice offered to / List presented to:  C-1 Patient   DME arranged  HOSPITAL BED  AIR OVERLAY MATTRESS  WHEELCHAIR - MANUAL      DME agency  [facility]     HH arranged  HH-1 RN  HH-2 PT  HH-3 OT  HH-4 NURSE'S AIDE      HH agency  [facility]   Status of service:  In process, will continue to follow Medicare Important Message given?   (If response is "NO", the following Medicare IM given date fields will be blank) Date Medicare IM given:   Date Additional Medicare IM given:    Discharge Disposition:  HOME W HOME HEALTH SERVICES  Per UR Regulation:    If discussed at Long Length of Stay Meetings, dates discussed:    Comments:  07/06/2013 Met with pt and family, pt selected [facility] for hh needs and DME. DME needs include: Hospital Bed, Speciality mattress and wheelchair. Family provided with list of private agencies for ongoing aide services on a 24 hr basis, as is their plan. Family and pt have decided against Home Hospice services at this time and will contact pt PCP post d/c if they find that they need Home Hospice services, currently they wish to use Home health so that they can  receive as much HHPT and HHOT as possible. Marco Shock RN MPH, case manager, [phone] Contact phone numbers to arrange delivery: Daughter: Marco Gregory    [phone] Pt: Marco Gregory [phone]

## 2013-07-06 NOTE — Progress Notes (Signed)
FMTS Attending  Note: Kehinde Eniola,MD I  have seen and examined this patient, reviewed their chart. I have discussed this patient with the resident. I agree with the resident's findings, assessment and care plan.  

## 2013-07-06 NOTE — Progress Notes (Signed)
Physical Therapy Treatment Patient Details Name: Marco Gregory MRN: [id number] DOB: 17-Oct-1920 Today's Date: 07/06/2013 Time: 4098-1191 PT Time Calculation (min): 24 min  PT Assessment / Plan / Recommendation  History of Present Illness Marco Gregory is a 79 y.o. male with Paroxysmal atrial fibrillation s/p Pacemaker, Diastolic CHF, and COPD presents with worsening SOB and frequent falls.   PT Comments   Pt admitted with above.   Pt currently with functional limitations due to the deficits listed below (see PT Problem List). Pt family wants to take pt home with HHPTf/u.  Appropriate per PT as long as family will hire a 24 hour caregiver.  Pt needs RW but pt has one.   Pt will benefit from skilled PT to increase their independence and safety with mobility to allow discharge to the venue listed below.  Follow Up Recommendations  Supervision/Assistance - 24 hour;Home health PT                 Equipment Recommendations  None recommended by PT        Frequency Min 3X/week   Progress towards PT Goals Progress towards PT goals: Progressing toward goals  Plan Current plan remains appropriate    Precautions / Restrictions Precautions Precautions: Fall Restrictions Weight Bearing Restrictions: No   Pertinent Vitals/Pain BP dropped to 83/32 with ambulation return to 100/45 after back in bed.  No pain    Mobility  Bed Mobility Bed Mobility: Supine to Sit;Sitting - Scoot to Edge of Bed;Sit to Supine Supine to Sit: 3: Mod assist Sitting - Scoot to Edge of Bed: 4: Min assist Sit to Supine: 3: Mod assist;HOB elevated Details for Bed Mobility Assistance: Assist to support trunk OOB and to transition LEs in/out of bed. Incr time due to pain. Transfers Transfers: Sit to Stand;Stand to Sit Sit to Stand: 3: Mod assist;With upper extremity assist;From bed Stand to Sit: 3: Mod assist;With upper extremity assist;To bed Details for Transfer Assistance: Pt needed cues for hand  placement.  Needed assist for power up as well as to control descent into chair.  Ambulation/Gait Ambulation/Gait Assistance: 4: Min assist;3: Mod assist Ambulation Distance (Feet): 65 Feet Assistive device: Rolling walker Ambulation/Gait Assistance Details: Pt needed cues to stay close to RW as well as for sequencing steps and RW.  needed assist to steer RW as well.  Postural cues as well as pt stays flexed.  Pt c/o dizziness.  BP dropped to 83/32 with ambulation.  Asssited pt back to bed and notified nursing.   Gait Pattern: Step-to pattern;Decreased stride length;Shuffle;Trunk flexed Gait velocity: decreased Stairs: No Wheelchair Mobility Wheelchair Mobility: No    PT Goals (current goals can now be found in the care plan section)    Visit Information  Last PT Received On: 07/06/13 Assistance Needed: +2 History of Present Illness: Marco Gregory is a 79 y.o. male with Paroxysmal atrial fibrillation s/p Pacemaker, Diastolic CHF, and COPD presents with worsening SOB and frequent falls.    Subjective Data  Subjective: "I feel better but am  weak."   Cognition  Cognition Arousal/Alertness: Awake/alert Behavior During Therapy: WFL for tasks assessed/performed (internally distracted by back pain) Overall Cognitive Status: No family/caregiver present to determine baseline cognitive functioning    Balance  Static Standing Balance Static Standing - Balance Support: Bilateral upper extremity supported;During functional activity Static Standing - Level of Assistance: 4: Min assist Static Standing - Comment/# of Minutes: 3  End of Session PT - End of Session Equipment Utilized During  Treatment: Gait belt Activity Tolerance: Patient limited by fatigue Patient left: in bed;with call bell/phone within reach;with bed alarm set Nurse Communication: Mobility status       INGOLD,Marguerite Jarboe 07/06/2013, 4:06 PM [facility] Acute Rehabilitation [phone] (pager)

## 2013-07-06 NOTE — Telephone Encounter (Signed)
Dr. Patty Sermons spoke with patients daughter on 07/04/13

## 2013-07-06 NOTE — Progress Notes (Signed)
Family Medicine Teaching Service Daily Progress Note Intern Pager: [phone]  Patient name: Marco Gregory Medical record number: [id number] Date of birth: Jul 24, 1921 Age: 79 y.o. Gender: male  Primary Care Provider: Elvina Sidle, MD Consultants: Cardiology, Surgery Code Status: DNR  Pt Overview and Major Events to Date:   Assessment and Plan: Marco Gregory is a 79 y.o. male with Paroxysmal atrial fibrillation s/p Pacemaker, Diastolic CHF, and COPD presents with worsening SOB and frequent falls.   # Dyspnea - Likely CHF exacerbation given elevated BNP (2389), rales on PE, and LE edema. Patient was given IV Lasix 40 mg in the ED. Long history of dysautonomia and low BP.  - Hgb stable at 11.8 today - Echo: mild LV hypertrophy. EF 55%. Mildly dilated RV with normal systolic function. mild aortic stenosis - Elevate legs - Strict I/O's: OUP yesterday -1520cc (0.7cc/kg/hr); Wt 187 down 1 lb from yesterday (202 admit) - Cardiology   Symptomatic treatment w/ Lasix: 20 PO BID   Palliative care consulted: Given info on hospice  Follow-up case management recs for home hospice - cardiology consulted CHF team per family request, follow-up recommendations  # Hyperkalemia: received potassium 60mg  total yesterday. Potassium increased from 3.4 to 5.3. - repeat Bmet later this afternoon and make sure potassium is not continuing to increase  # Falls - Have been worsening recently. Likely secondary to orthostasis and deconditioning/weakness.  - PT/OT: Rec 24 hr assistance vs SNF - SW and palliative consulted - Patient wishes to go home; hospice as a possibility (Wife is at [facility])  # UTI - UA revealed moderate leukocytes. Patient endorsing some dysuria. Patient was given CTX in the ED.  - Cx: NG x 3days - No dysuria: UTI covered by cellulitis abx   # Paroxysmal atrial fibrillation  - Dig level subtheraputic on admit: 0.5 - Currently in Afib w/o RVR - Continuing home Digoxin    # LE ulcer and Sacral Decubitus ulcer (Stage 3)  - Wound care consulted; May need to image sacrum for Osteomyelitis or broaden abx to cover Group A if spikes fever - WBC wnl on 11/25 - follow-up ABI ; discuss boot with wound care pending results - Bactrim: day 4  - Surgery consult for discussion of Sacral Ulcer debridement Patient made NPO for possible surgery today. Cardiology cleared patient for general anesthesia. Patient, however, ate a couple of bites of food. Will follow-up surgery recommendations today  # Dysautonomia and hypotension  - discontinued Florinef in the setting of diuresis as it increases sodium reabsorption (increasing volume)  - continue Midodrine 10mg  TID  # Hypothyroidism  - Continuing home synthroid   # ? Myasthenia  - This is documented in the medical record but per patient and daughter, they are unaware of this condition.  - Will continue [place].   FEN/GI: Saline Lock IV; Heart Healthy Diet  Prophylaxis: Heparin SQ   Disposition: Pending clinical improvement, surgery recommendations, CHF team evaluation and possible home hospice  Subjective: Patient reports no problems overnight. No chest pain, SOB is significantly better.    Objective: Temp:  [97.8 F (36.6 C)-98.2 F (36.8 C)] 98.1 F (36.7 C) (11/28 0601) Pulse Rate:  [60-77] 77 (11/28 0729) Resp:  [18-20] 20 (11/28 0729) BP: (91-142)/(53-89) 132/89 mmHg (11/28 0729) SpO2:  [93 %-100 %] 95 % (11/28 0729) Weight:  [181 lb 7 oz (82.3 kg)-187 lb 6.3 oz (85 kg)] 187 lb 6.3 oz (85 kg) (11/28 0700)  Physical Exam: General: sleeping but easily arousable, laying in bed  in no acute distress, pleasant, looks comfortable  Cardiovascular: Regular rate, rhythm. 1/6 systolic murmur noted.  Respiratory: Bibasilar rales noted w/ wheezing noted on exam.  Extremities: Lower extremities ~4 cm circular wound noted on the left lateral lower leg; Purpura also noted on left lower leg. Minimal bilateral pitting  edema Neuro: A&O x 3.    Laboratory:  Recent Labs Lab 07/03/13 0615 07/05/13 0540 07/06/13 0550  WBC 7.8 8.9 6.3  HGB 11.3* 11.6* 11.8*  HCT 34.4* 35.7* 35.8*  PLT 224 249 260    Recent Labs Lab 07/03/13 0615 07/05/13 0540 07/06/13 0550  NA 138 136 137  K 4.1 3.4* 5.3*  CL 98 98 100  CO2 31 31 31   BUN 15 19 19   CREATININE 1.07 1.12 1.17  CALCIUM 8.7 8.7 8.8  PROT 6.4  --   --   BILITOT 0.9  --   --   ALKPHOS 84  --   --   ALT 14  --   --   AST 22  --   --   GLUCOSE 84 104* 100*   Imaging/Diagnostic Tests:  No recent imaging/diagnostic tests  Jacquelin Hawking, MD 07/06/2013, 8:41 AM PGY-1, [facility] FPTS Intern pager: [phone], text pages welcome

## 2013-07-06 NOTE — Progress Notes (Signed)
Pt had a BP 83/32 when ambulating with PT, Pt stated he felt dizzy when ambulating. Pt check Pt lying 104/40s. Rn check Pt after dressing change, 100/62.. MD paged. Will continue to monitor.

## 2013-07-06 NOTE — Progress Notes (Signed)
General Surgery [facility]  Patient seen at bedside.  Discussed with Emina Riebock who examined wound this morning.  Plan local wound care adding Santyl at this time.  Await improvement in cardiac status.  Will reassess on Monday 12/1 for possible need of operative debridement (Dr. Jamey Ripa will evaluate).  Patient understands and agrees with plans.  Call if needed over the weekend.  Velora Heckler, MD, [facility] Office: [phone]

## 2013-07-06 NOTE — Consult Note (Signed)
ADVANCED HEART FAILURE CONSULT NOTE  Patient ID: Marco Gregory MRN: [id number] DOB/AGE: [birth year] 79 y.o.  Admit date: 07/02/2013 Primary Physician: Dr. Faustino Congress Primary Cardiologist: Dr. Patty Sermons Reason for Consultation: CHF  HPI: 79 yo with history of diastolic CHF, persistent atrial fibrillation, tachy-brady syndrome s/p St Jude PCM, and autonomic dysfunction presented initially with CHF exacerbation.  At baseline, he has severe autonomic dysfunction with orthostatic lightheadedness and frequent falls.  He is not very active and spends most of the day in a chair.  He has developed a sacral decubitus.  The proximate cause for the admission, however, was CHF.  His daughter notes that he has seemed more short of breath with ambulation (walks short distances with walker) and was short of breath while talking prior to admission.  He has improved somewhat since he has been in the hospital but still is dyspneic and orthopneic.  He has been on Lasix 20 mg po bid.  At home, he takes Lasix 20 mg po daily.  He has been in rate-controlled atrial fibrillation in the hospital.  He has been evaluated by the palliative care service.   Review of systems complete and found to be negative unless listed above in HPI  Past Medical History: 1. Autonomic dysfunction with orthostatic dizziness.  Patient had been on midodrine, pyridostigmine, and Florinef at home.  Frequent falls.  2. Sacral decubitus ulcer 3. COPD: Quit smoking in 1960s 4. Diastolic CHF: Echo (11/14) with EF 55%, mild RV dilation/normal systolic function, mild aortic stenosis, PA systolic pressure 54 mmHg.  5. OSA 6. Atrial fibrillation: Persistent.  He has refused anticoagulation but is also a high fall risk.  7. Sick sinus syndrome s/p St Jude PCM.  8. Hypothyroidism  Family History  Problem Relation Age of Onset  . Cancer Mother 68    GYN    History   Social History  . Marital Status: Married    Spouse Name: N/A   Number of Children: 2  . Years of Education: N/A   Occupational History  . Retired    Social History Main Topics  . Smoking status: Former Smoker -- 1.00 packs/day for 25 years    Types: Cigarettes    Quit date: 08/09/1965  . Smokeless tobacco: Never Used  . Alcohol Use: No     Comment: "last alcohol ~ 2008"  . Drug Use: No  . Sexual Activity: No   Other Topics Concern  . Not on file   Social History Narrative  . No narrative on file     Prescriptions prior to admission  Medication Sig Dispense Refill  . digoxin (LANOXIN) 0.125 MG tablet Take 1 tablet (125 mcg total) by mouth daily.  90 tablet  3  . dorzolamide-timolol (COSOPT) 22.3-6.8 MG/ML ophthalmic solution Place 1 drop into both eyes daily.       . finasteride (PROSCAR) 5 MG tablet Take 5 mg by mouth daily.       . fludrocortisone (FLORINEF) 0.1 MG tablet Take 0.1 mg by mouth daily.      . furosemide (LASIX) 20 MG tablet 20 mg 2 (two) times daily.       [name] levothyroxine (SYNTHROID, LEVOTHROID) 150 MCG tablet Take 150 mcg by mouth daily before breakfast.      . meclizine (ANTIVERT) 25 MG tablet Take 25 mg by mouth 3 (three) times daily as needed for dizziness.      . midodrine (PROAMATINE) 10 MG tablet Take 10 mg by mouth 2 (two)  times daily.      . Multiple Vitamin (MULTIVITAMIN WITH MINERALS) TABS Take 1 tablet by mouth daily.      [name] pyridostigmine (MESTINON) 60 MG tablet Take 60 mg by mouth 3 (three) times daily. Breakfast lunch and dinner      . tamsulosin (FLOMAX) 0.4 MG CAPS Take 0.4 mg by mouth daily.      . traMADol (ULTRAM) 50 MG tablet Take 50 mg by mouth every 12 (twelve) hours as needed for moderate pain.         Physical exam Blood pressure 119/74, pulse 68, temperature 98.1 F (36.7 C), temperature source Oral, resp. rate 20, height 6\' 3"  (1.905 m), weight 187 lb 6.3 oz (85 kg), SpO2 95.00%. General: NAD, frail Neck: JVP 11-12 cm, no thyromegaly or thyroid nodule.  Lungs: Crackles at bases  bilaterally CV: Nondisplaced PMI.  Heart regular S1/S2, no S3/S4,2/6 early SEM RUSB.  1+ edema 1/2 up lower legs bilaterally.  No carotid bruit.   Abdomen: Soft, nontender, no hepatosplenomegaly, no distention.  Skin: Dressed sacral decubitus ulcer.  Dressed left lower leg ulceration.  Neurologic: Alert and oriented x 3.  Psych: Normal affect. Extremities: No clubbing or cyanosis.   Labs:   Lab Results  Component Value Date   WBC 6.3 07/06/2013   HGB 11.8* 07/06/2013   HCT 35.8* 07/06/2013   MCV 93.5 07/06/2013   PLT 260 07/06/2013    Recent Labs Lab 07/03/13 0615  07/06/13 0550  NA 138  < > 137  K 4.1  < > 5.3*  CL 98  < > 100  CO2 31  < > 31  BUN 15  < > 19  CREATININE 1.07  < > 1.17  CALCIUM 8.7  < > 8.8  PROT 6.4  --   --   BILITOT 0.9  --   --   ALKPHOS 84  --   --   ALT 14  --   --   AST 22  --   --   GLUCOSE 84  < > 100*  < > = values in this interval not displayed. Lab Results  Component Value Date   CKTOTAL 75 03/11/2012   CKMB 4.2* 03/11/2012   TROPONINI <0.30 04/19/2012     Radiology: - CXR: Pulmonary vascular congestion  EKG: Atrial fibrillation, V-paced  ASSESSMENT AND PLAN:  79 yo with history of diastolic CHF, persistent atrial fibrillation, tachy-brady syndrome s/p St Jude PCM, and autonomic dysfunction presented initially with acute on chronic diastolic CHF exacerbation.  1. Orthostatic hypotension: Long-standing.  Has been thought to have autonomic insufficiency.  This is a very limiting symptom.   - He continues on midodrine and pyridostigmine.   Would leave these medications at current doses.  - Agree with discontinuation of Florinef given CHF.  - I would also stop Flomax, which can contribute to orthostatic hypotension. He is also on finasteride for his prostate.  - I would hold off on compression stockings for now given wound on left leg.  - I am not sure that we are going to make this particular problem much better in the long run.  2. Atrial  fibrillation: V-paced.  Rate is reasonable.  He is not anticoagulated (high fall risk) and has refused anticoagulation anyway.  3. Acute on chronic diastolic CHF: Patient is volume overloaded on exam.  He needs fluid off to help him breathe better.  I will stop po Lasix and start Lasix 40 mg IV bid.  Follow response.  4. Sacral decubitus: per primary team.  5. Agree with palliative care consult.  I think that long-term his prognosis is poor.   Marca Ancona 07/06/2013 12:29 PM   Signed: @ME1 @ 07/06/2013, 12:16 PM Co-Sign MD

## 2013-07-06 NOTE — Progress Notes (Signed)
  Subjective: Pt c/o sob when eating only.  No fever, chills or sweats.  Objective: Vital signs in last 24 hours: Temp:  [97.8 F (36.6 C)-98.2 F (36.8 C)] 98.1 F (36.7 C) (11/28 0601) Pulse Rate:  [60-77] 68 (11/28 1022) Resp:  [18-20] 20 (11/28 0729) BP: (107-142)/(53-89) 119/74 mmHg (11/28 1022) SpO2:  [93 %-100 %] 95 % (11/28 0729) Weight:  [181 lb 7 oz (82.3 kg)-187 lb 6.3 oz (85 kg)] 187 lb 6.3 oz (85 kg) (11/28 0700) Last BM Date: 07/05/13  Intake/Output from previous day: 11/27 0701 - 11/28 0700 In: 1083 [P.O.:1080; I.V.:3] Out: 1521 [Urine:1520; Stool:1] Intake/Output this shift: Total I/O In: 3 [I.V.:3] Out: 250 [Urine:250]  Physical Exam:  General: Pt awake/alert/oriented x3 in no acute distress  Wound: approx 5x4cm decubitus ulcer to coccyx, yellow slough, no eschar.    Lab Results:   Recent Labs  07/05/13 0540 07/06/13 0550  WBC 8.9 6.3  HGB 11.6* 11.8*  HCT 35.7* 35.8*  PLT 249 260   BMET  Recent Labs  07/05/13 0540 07/06/13 0550  NA 136 137  K 3.4* 5.3*  CL 98 100  CO2 31 31  GLUCOSE 104* 100*  BUN 19 19  CREATININE 1.12 1.17  CALCIUM 8.7 8.8   PT/INR  Recent Labs  07/04/13 1032  LABPROT 15.2  INR 1.23   ABG No results found for this basename: PHART, PCO2, PO2, HCO3,  in the last 72 hours  Studies/Results: No results found.  Anti-infectives: Anti-infectives   Start     Dose/Rate Route Frequency Ordered Stop   07/03/13 1500  sulfamethoxazole-trimethoprim (BACTRIM DS) 800-160 MG per tablet 1 tablet     1 tablet Oral Every 12 hours 07/03/13 1427     07/03/13 1245  sulfamethoxazole-trimethoprim (BACTRIM DS) 800-160 MG per tablet 1 tablet  Status:  Discontinued     1 tablet Oral Every 12 hours 07/03/13 1243 07/03/13 1427   07/02/13 1930  cefTRIAXone (ROCEPHIN) 1 g in dextrose 5 % 50 mL IVPB     1 g 100 mL/hr over 30 Minutes Intravenous  Once 07/02/13 1920 07/02/13 2050      Assessment/Plan: Stage III sacral decubitus  ulcer  -WOC consult -santyl -no surgical intervention at present time. -discussed with family agree with plan of care.     LOS: 4 days    Christophor Eick, [facility] 07/06/2013

## 2013-07-06 NOTE — Plan of Care (Signed)
Problem: Phase I Progression Outcomes Goal: EF % per last Echo/documented,Core Reminder form on chart Outcome: Completed/Met Date Met:  07/06/13 55%

## 2013-07-06 NOTE — Progress Notes (Signed)
Family Medicine Teaching Service Interim Progress Note  Given patient's symptomatic orthostatic hypotension (dizziness) with BPs down to 83/32 while standing after receiving IV lasix, discontinued order and will reassess in the morning.  Jacquelin Hawking, MD 07/06/2013, 5:20 PM PGY-1, [facility]

## 2013-07-06 NOTE — Progress Notes (Signed)
Pt has order to Access need for foley, but no orders to maintain Foley. MD page for order to maintain foley.

## 2013-07-06 NOTE — Progress Notes (Signed)
Patient's indwelling catheter noted to be twisted around the statlock device.  Fixed catheter and statlock.  Will continue to monitor.

## 2013-07-06 NOTE — Clinical Social Work Psychosocial (Signed)
     Clinical Social Work Department BRIEF PSYCHOSOCIAL ASSESSMENT 07/06/2013  Patient:  Marco Gregory, Marco Gregory     Account Number:  [id number]     Admit date:  07/02/2013  Clinical Social Worker:  Tiburcio Pea  Date/Time:  07/06/2013 11:12 AM  Referred by:  Physician  Date Referred:  07/04/2013 Referred for  Other - See comment   Other Referral:   Interview type:  Other - See comment Other interview type:   Patient, daughter [name] and granddaughter Alan Ripper    PSYCHOSOCIAL DATA Living Status:  FAMILY Admitted from facility:   Level of care:   Primary support name:  Monico Hoar -Daughter [phone] Primary support relationship to patient:  CHILD, ADULT Degree of support available:   Strong support    Victorino Fatzinger- wife- currently a resident of [facility]    CURRENT CONCERNS Current Concerns  Other - See comment   Other Concerns:   Hospice vs Home health    SOCIAL WORK ASSESSMENT / PLAN CSW met with patient, daughter [name] and granddaughter Alan Ripper to discuss d/c needs. Patient is alert and oriented; fully involved in process. Patient and family state that they have been counseled by Dr. [name] and family regarding possible Hospice referral.They asked many quesitons about home health vs Hospice and patient has elected, at this time, to seek home health. He does not want to go to a SNF at this time and prefers to return home.  Daughter wants to bring their mother home from [place] in the near future so that they can be togther as opposed to him being placed at [facility].  They are requesting a hospital bed and air mattress, RN and PT for home health. They have a private sitter in place to help and home and plan to seek additional private care at home through one of the local private home care agency.  CSW discussed with RNCM - Elnita Maxwell Royal who will follow up with patient and family.   Assessment/plan status:  Psychosocial Support/Ongoing Assessment of Needs Other  assessment/ plan:   Information/referral to community resources:   Referred to [facility] for home care follow up.    PATIENTS/FAMILYS RESPONSE TO PLAN OF CARE: Patient is alert and oriented; very pleasant gentle who is very invovled in his plan of care.  Patient's daughter is extremely supportive and has been staying with patient and her mother (prior to SNF placement).  They want to try home health and private duty care prior to seeking hospice services but are aware that this will probably be needed in the future.  CSW will sign off for now but will be available in the future if needed.  Patient, daughter, and granddaughter were very pleased with d/c plan. Lorri Frederick. Shannelle Alguire, LCSWA  [phone]

## 2013-07-06 NOTE — Progress Notes (Signed)
Weight Pt on Bed scale at 600am, Weight on standing scale a 730am.  After Pt return to bed form standing on scale Pt stated feeling SOB, VS wnl, O2 stat 95% on RA, After 3-36min Pt stated he was no long SOB. Pt states his back is in pain, 7 out of 10. Rn will give PRN and continue to monitor

## 2013-07-06 NOTE — Progress Notes (Signed)
VASCULAR LAB PRELIMINARY  ARTERIAL  ABI completed:    RIGHT    LEFT    PRESSURE WAVEFORM  PRESSURE WAVEFORM  BRACHIAL 139 Triphasic BRACHIAL 124 Triphasic  DP >305 Triphasic DP 183 Triphasic  PT >311 Triphasic PT 194 Triphasic    RIGHT LEFT  ABI N/A CA++ 1.40   Right ABIs could not be ascertained due to incompressible pedal vessels probable secondary to calcification. Doppler waveforms however are normal. Left ABI indicates mild calcification of the pedal vessels with normal Doppler waveforms  Marco Gregory, RVS 07/06/2013, 7:26 PM

## 2013-07-07 LAB — BASIC METABOLIC PANEL
BUN: 24 mg/dL — ABNORMAL HIGH (ref 6–23)
CO2: 33 mEq/L — ABNORMAL HIGH (ref 19–32)
Chloride: 99 mEq/L (ref 96–112)
Creatinine, Ser: 1.11 mg/dL (ref 0.50–1.35)
GFR calc Af Amer: 65 mL/min — ABNORMAL LOW (ref >90)
Glucose, Bld: 96 mg/dL (ref 70–99)

## 2013-07-07 LAB — CBC
HCT: 36.8 % — ABNORMAL LOW (ref 39.0–52.0)
Hemoglobin: 12.3 g/dL — ABNORMAL LOW (ref 13.0–17.0)
MCV: 93.6 fL (ref 78.0–100.0)
RBC: 3.93 MIL/uL — ABNORMAL LOW (ref 4.22–5.81)
RDW: 13.5 % (ref 11.5–15.5)
WBC: 5.3 10*3/uL (ref 4.0–10.5)

## 2013-07-07 MED ORDER — FUROSEMIDE 10 MG/ML IJ SOLN
40.0000 mg | Freq: Every day | INTRAMUSCULAR | Status: DC
  Administered 2013-07-07 – 2013-07-08 (×2): 40 mg via INTRAVENOUS
  Filled 2013-07-07 (×3): qty 4

## 2013-07-07 NOTE — Progress Notes (Signed)
Patient ZO:[id number] Marco Gregory      DOB: 1920/10/14      WUJ:[id number]  Late entry.   Chart reviewed.  Spoke with patient. States still feeling rough as his blood pressure dropped with getting up.  Family asserting home with home health.  Will continue to follow.  Gitty Osterlund L. [name], MD MBA The Palliative Medicine Team at [facility] Phone: [phone] Pager: [phone]

## 2013-07-07 NOTE — Progress Notes (Signed)
   CARE MANAGEMENT NOTE 07/07/2013  Patient:  Marco Gregory, Marco Gregory   Account Number:  [id number]  Date Initiated:  07/06/2013  Documentation initiated by:  ROYAL,CHERYL  Subjective/Objective Assessment:   Order for [facility] and hospice choices, however this family has decided that they do not wish hospice services at this time, but would like [facility] services and DME as pt wishes to have HHPT and HHOT.     Action/Plan:   Met with pt and daughter, they wish to use [facility] and will place list of DME needs in chart for MD to order as requested.   Anticipated DC Date:     Anticipated DC Plan:  HOME W HOME HEALTH SERVICES         Choice offered to / List presented to:  C-1 Patient   DME arranged  HOSPITAL BED  AIR OVERLAY MATTRESS  WHEELCHAIR - MANUAL      DME agency  [facility]     HH arranged  HH-1 RN  HH-2 PT  HH-3 OT  HH-4 NURSE'S AIDE      HH agency  [facility]   Status of service:  Completed, signed off Medicare Important Message given?   (If response is "NO", the following Medicare IM given date fields will be blank) Date Medicare IM given:   Date Additional Medicare IM given:    Discharge Disposition:  HOME W HOME HEALTH SERVICES  Per UR Regulation:    If discussed at Long Length of Stay Meetings, dates discussed:    Comments:  07/07/13 16:00 CM called [facility] to clarify orders for hospital bed and overlay and to notify of discharge.  No other CM needs were communicated.  [name], BSN, [phone].  07/06/2013 Met with pt and family, pt selected [facility] for hh needs and DME. DME needs include: Hospital Bed, Speciality mattress and wheelchair. Family provided with list of private agencies for ongoing aide services on a 24 hr basis, as is their plan. Family and pt have decided against Home Hospice services at this time and will contact pt PCP post d/c if they find that they need Home Hospice services, currently they wish to use Home health so that they  can  receive as much HHPT and HHOT as possible. Johny Shock RN MPH, case manager, [phone]

## 2013-07-07 NOTE — Progress Notes (Addendum)
SUBJECTIVE:  No compliants this am  OBJECTIVE:   Vitals:   Filed Vitals:   07/06/13 1617 07/06/13 2047 07/07/13 0420 07/07/13 0523  BP: 100/62 122/65  144/74  Pulse: 67 65  69  Temp:  97.1 F (36.2 C)  97.3 F (36.3 C)  TempSrc:  Oral  Oral  Resp:  18  18  Height:      Weight:   184 lb 1.4 oz (83.5 kg)   SpO2:  96%  94%   I&O's:   Intake/Output Summary (Last 24 hours) at 07/07/13 1027 Last data filed at 07/07/13 0900  Gross per 24 hour  Intake    600 ml  Output   1325 ml  Net   -725 ml    PHYSICAL EXAM General: Well developed, well nourished, in no acute distress Head: Eyes PERRLA, No xanthomas.   Normal cephalic and atramatic  Lungs:  Crackles at bases bilaterally Heart:   Irregularly irregular S1 S2 Pulses are 2+ & equal. Abdomen: Bowel sounds are positive, abdomen soft and non-tender without masses Extremities:   No clubbing, cyanosis or edema.  DP +1 Neuro: Alert and oriented X 3. Psych:  Good affect, responds appropriately   LABS: Basic Metabolic Panel:  Recent Labs  16/10/96 1528 07/07/13 0500  NA 137 135  K 4.6 4.9  CL 98 99  CO2 32 33*  GLUCOSE 119* 96  BUN 21 24*  CREATININE 1.12 1.11  CALCIUM 8.9 8.8   Liver Function Tests: No results found for this basename: AST, ALT, ALKPHOS, BILITOT, PROT, ALBUMIN,  in the last 72 hours No results found for this basename: LIPASE, AMYLASE,  in the last 72 hours CBC:  Recent Labs  07/06/13 0550 07/07/13 0500  WBC 6.3 5.3  HGB 11.8* 12.3*  HCT 35.8* 36.8*  MCV 93.5 93.6  PLT 260 255   Cardiac Enzymes: No results found for this basename: CKTOTAL, CKMB, CKMBINDEX, TROPONINI,  in the last 72 hours BNP: No components found with this basename: POCBNP,  D-Dimer: No results found for this basename: DDIMER,  in the last 72 hours Hemoglobin A1C: No results found for this basename: HGBA1C,  in the last 72 hours Fasting Lipid Panel: No results found for this basename: CHOL, HDL, LDLCALC, TRIG, CHOLHDL,  LDLDIRECT,  in the last 72 hours Thyroid Function Tests: No results found for this basename: TSH, T4TOTAL, FREET3, T3FREE, THYROIDAB,  in the last 72 hours Anemia Panel: No results found for this basename: VITAMINB12, FOLATE, FERRITIN, TIBC, IRON, RETICCTPCT,  in the last 72 hours Coag Panel:   Lab Results  Component Value Date   INR 1.23 07/04/2013   INR 3.07* 04/19/2012   INR 3.2 04/06/2012    RADIOLOGY: Dg Chest 2 View  07/02/2013   CLINICAL DATA:  Leg swelling, history of CHF  EXAM: CHEST  2 VIEW  COMPARISON:  Prior chest x-ray 06/07/2012  FINDINGS: Stable cardiomegaly. Atherosclerotic calcifications noted in the transverse aorta. Left subclavian approach cardiac rhythm maintenance device with leads projecting over the right atrium and right ventricle. Mild pulmonary vascular congestion, slightly progressed from prior but without overt edema. Elevation of left hemidiaphragm with mild lingular atelectasis. No pneumothorax or large pleural effusion. No acute osseous abnormality. Central bronchitic changes are similar compared to prior.  IMPRESSION: Mild pulmonary vascular congestion without overt edema.  Stable cardiomegaly.   Electronically Signed   By: Malachy Moan M.D.   On: 07/02/2013 17:22   Ct Head Wo Contrast  07/02/2013   CLINICAL  DATA:  Left-sided neck pain, recent falls  EXAM: CT HEAD WITHOUT CONTRAST  CT CERVICAL SPINE WITHOUT CONTRAST  TECHNIQUE: Multidetector CT imaging of the head and cervical spine was performed following the standard protocol without intravenous contrast. Multiplanar CT image reconstructions of the cervical spine were also generated.  COMPARISON:  04/19/2012  FINDINGS: CT HEAD FINDINGS  Mild soft tissue changes are noted within the left maxillary antrum likely of a chronic nature. The bony calvarium is intact. Atrophic changes are identified as well as chronic white matter ischemic change. No findings to suggest acute hemorrhage, acute infarction or  space-occupying mass lesion are noted.  CT CERVICAL SPINE FINDINGS  Seven cervical segments are well visualized. Vertebral body height is well maintained. Multilevel facet hypertrophic changes are noted. No acute fracture or acute facet abnormality is noted. The surrounding soft tissues are within normal limits with the exception of carotid calcifications.  IMPRESSION: CT head:  Chronic changes without acute abnormality.  CT of the cervical spine: Degenerative change without acute abnormality.   Electronically Signed   By: Alcide Clever M.D.   On: 07/02/2013 19:07   Ct Cervical Spine Wo Contrast  07/02/2013   CLINICAL DATA:  Left-sided neck pain, recent falls  EXAM: CT HEAD WITHOUT CONTRAST  CT CERVICAL SPINE WITHOUT CONTRAST  TECHNIQUE: Multidetector CT imaging of the head and cervical spine was performed following the standard protocol without intravenous contrast. Multiplanar CT image reconstructions of the cervical spine were also generated.  COMPARISON:  04/19/2012  FINDINGS: CT HEAD FINDINGS  Mild soft tissue changes are noted within the left maxillary antrum likely of a chronic nature. The bony calvarium is intact. Atrophic changes are identified as well as chronic white matter ischemic change. No findings to suggest acute hemorrhage, acute infarction or space-occupying mass lesion are noted.  CT CERVICAL SPINE FINDINGS  Seven cervical segments are well visualized. Vertebral body height is well maintained. Multilevel facet hypertrophic changes are noted. No acute fracture or acute facet abnormality is noted. The surrounding soft tissues are within normal limits with the exception of carotid calcifications.  IMPRESSION: CT head:  Chronic changes without acute abnormality.  CT of the cervical spine: Degenerative change without acute abnormality.   Electronically Signed   By: Alcide Clever M.D.   On: 07/02/2013 19:07    ASSESSMENT AND PLAN:  79 yo with history of diastolic CHF, persistent atrial  fibrillation, tachy-brady syndrome s/p St Jude PCM, and autonomic dysfunction presented initially with acute on chronic diastolic CHF exacerbation.  1. Orthostatic hypotension: Long-standing. Has been thought to have autonomic insufficiency. This is a very limiting symptom.  - He continues on midodrine and pyridostigmine. Would leave these medications at current doses.  -  Florinef stopped given CHF.  -  Flomax stopped yesterday -  Would hold off on compression stockings for now given wound on left leg.  2. Atrial fibrillation: V-paced. Rate is reasonable. He is not anticoagulated (high fall risk) and has refused anticoagulation anyway.  3. Acute on chronic diastolic CHF: Patient is volume overloaded on exam. He diuresed 1.5L yesterday and id 3.4L negative since admit.  He is down 3 lbs since yesterday.  He still has crackles at bases. His lasix was stopped yesterday due to orthostasis.   He needs fluid off to help him breathe better.Will give IV Lasix 40 mg today and follow response.  4. Sacral decubitus: per primary team.  5. Agree with palliative care consult. I think that long-term his prognosis is poor.  Quintella Reichert, MD  07/07/2013  10:27 AM

## 2013-07-07 NOTE — Progress Notes (Signed)
Family Medicine Teaching Service Daily Progress Note Intern Pager: [phone]  Patient name: Marco Gregory Medical record number: [id number] Date of birth: August 21, 1920 Age: 79 y.o. Gender: male  Primary Care Provider: Elvina Sidle, MD Consultants: Cardiology, Surgery Code Status: DNR  Pt Overview and Major Events to Date:   Assessment and Plan: Marco Gregory is a 79 y.o. male with Paroxysmal atrial fibrillation s/p Pacemaker, Diastolic CHF, and COPD presents with worsening SOB and frequent falls.   # Dyspnea - Likely CHF exacerbation given elevated BNP (2389), rales on PE, and LE edema. Patient was given IV Lasix 40 mg in the ED. Long history of dysautonomia and low BP.  - Hgb stable at 12.3 today - Echo: mild LV hypertrophy. EF 55%. Mildly dilated RV with normal systolic function. mild aortic stenosis - Elevate legs - Strict I/O's: OUP yesterday ~ -1500cc (0.8cc/kg/hr); Wt 184 lb  (202 admit) - Cardiology   Palliative care consulted:   Follow-up case management recs: Given info on home health - cardiology consulted CHF team per family request, follow-up recommendations  Flomax discontinued   Diuresis per HF team: Lasix stopped yesterday due to orthostatic hypotension  # LE ulcer and Sacral Decubitus ulcer (Stage 3)  - Wound care consulted; May need to image sacrum for Osteomyelitis or broaden abx to cover Group A if spikes fever - WBC wnl on 11/29 - ABI ; Left ABI indicates mild calcification of the pedal vessels with normal Doppler waveforms - Bactrim: day 5 - Surgery consult for discussion of Sacral Ulcer debridement   Will reassess on Monday 12/1 for possible need of operative debridement (Dr. Jamey Ripa will evaluate).  # Hyperkalemia: Resolved 4.9 today - continue to follow  # Falls - Have been worsening recently. Likely secondary to orthostasis and deconditioning/weakness.  - PT/OT: Rec 24 hr assistance vs SNF - SW and palliative consulted - Patient  wishes to go home; hospice as a possibility (Wife is at [facility])  # UTI - UA revealed moderate leukocytes. Patient endorsing some dysuria. Patient was given CTX in the ED.  - Cx: NG x 5 days - No dysuria: UTI covered by cellulitis abx   # Paroxysmal atrial fibrillation  - Dig level subtheraputic on admit: 0.5 - Currently in Afib w/o RVR - Continuing home Digoxin   # Dysautonomia and hypotension  - discontinued Florinef in the setting of diuresis as it increases sodium reabsorption (increasing volume)  - continue Midodrine 10mg  TID  # Hypothyroidism  - Continuing home synthroid   # Myasthenia  - This is documented in the medical record but per patient and daughter, they are unaware of this condition.  - Will continue [place].   FEN/GI: Saline Lock IV; Heart Healthy Diet  Prophylaxis: Heparin SQ   Disposition: Pending clinical improvement, surgery recommendations, CHF team evaluation and possible home hospice  Subjective: Patient reports some pain in his lower back/buttocks area. No chest pain and SOB is significantly better.    Objective: Temp:  [97.1 F (36.2 C)-98.1 F (36.7 C)] 97.3 F (36.3 C) (11/29 0523) Pulse Rate:  [62-69] 69 (11/29 0523) Resp:  [18] 18 (11/29 0523) BP: (100-144)/(56-74) 144/74 mmHg (11/29 0523) SpO2:  [94 %-96 %] 94 % (11/29 0523) Weight:  [184 lb 1.4 oz (83.5 kg)] 184 lb 1.4 oz (83.5 kg) (11/29 0420)  Physical Exam: General: sleeping but easily arousable, laying in bed in no acute distress, pleasant, looks comfortable  Cardiovascular: Regular rate, rhythm. 1/6 systolic murmur noted.  Respiratory: Bibasilar rales noted  w/ wheezing noted on exam.  Extremities: Lower extremities ~4 cm circular wound noted on the left lateral lower leg; Purpura also noted on left lower leg which is much improved. Minimal bilateral pitting edema Neuro: A&O x 3.    Laboratory:  Recent Labs Lab 07/05/13 0540 07/06/13 0550 07/07/13 0500  WBC 8.9 6.3 5.3   HGB 11.6* 11.8* 12.3*  HCT 35.7* 35.8* 36.8*  PLT 249 260 255    Recent Labs Lab 07/03/13 0615  07/06/13 0550 07/06/13 1528 07/07/13 0500  NA 138  < > 137 137 135  K 4.1  < > 5.3* 4.6 4.9  CL 98  < > 100 98 99  CO2 31  < > 31 32 33*  BUN 15  < > 19 21 24*  CREATININE 1.07  < > 1.17 1.12 1.11  CALCIUM 8.7  < > 8.8 8.9 8.8  PROT 6.4  --   --   --   --   BILITOT 0.9  --   --   --   --   ALKPHOS 84  --   --   --   --   ALT 14  --   --   --   --   AST 22  --   --   --   --   GLUCOSE 84  < > 100* 119* 96  < > = values in this interval not displayed. Imaging/Diagnostic Tests:  No recent imaging/diagnostic tests  Wenda Low, MD 07/07/2013, 9:30 AM PGY-1, [facility] Family Medicine FPTS Intern pager: [phone], text pages welcome

## 2013-07-07 NOTE — Progress Notes (Signed)
FMTS Attending Note  I personally saw and evaluated the patient. The plan of care was discussed with the resident team. I agree with the assessment and plan as documented by the resident.   1. CHF exacerbation - symptoms improved with diuresis, appreciated cardiology input 2. Sacral decubitus ulceration - plan for OR on Monday per surgery for debridement Other medical conditions stable and management per resident note.  Donnella Sham MD

## 2013-07-08 LAB — BASIC METABOLIC PANEL
BUN: 22 mg/dL (ref 6–23)
Calcium: 8.9 mg/dL (ref 8.4–10.5)
Creatinine, Ser: 1.14 mg/dL (ref 0.50–1.35)
GFR calc Af Amer: 63 mL/min — ABNORMAL LOW (ref >90)
GFR calc non Af Amer: 55 mL/min — ABNORMAL LOW (ref >90)
Glucose, Bld: 98 mg/dL (ref 70–99)
Potassium: 4.8 mEq/L (ref 3.5–5.1)

## 2013-07-08 LAB — CBC
HCT: 36.4 % — ABNORMAL LOW (ref 39.0–52.0)
MCH: 30.3 pg (ref 26.0–34.0)
MCHC: 32.4 g/dL (ref 30.0–36.0)
MCV: 93.6 fL (ref 78.0–100.0)
RDW: 13.6 % (ref 11.5–15.5)

## 2013-07-08 MED ORDER — DOCUSATE SODIUM 100 MG PO CAPS
100.0000 mg | ORAL_CAPSULE | Freq: Two times a day (BID) | ORAL | Status: DC
  Administered 2013-07-08 – 2013-07-12 (×7): 100 mg via ORAL
  Filled 2013-07-08 (×10): qty 1

## 2013-07-08 MED ORDER — BISACODYL 10 MG RE SUPP
10.0000 mg | Freq: Every day | RECTAL | Status: DC | PRN
  Administered 2013-07-12: 12:00:00 10 mg via RECTAL
  Filled 2013-07-08: qty 1

## 2013-07-08 NOTE — Progress Notes (Signed)
Patient Name: Marco Gregory Date of Encounter: 07/08/2013  Principal Problem:   Acute exacerbation of CHF (congestive heart failure) Active Problems:   DYSAUTONOMIA   Orthostasis   Myasthenia gravis   PAF (paroxysmal atrial fibrillation)   Skin ulcer   Decubitus ulcer of sacral region, stage 3   Fall at home    SUBJECTIVE: Breathing better, weight is down. Still with congestion, cough.  OBJECTIVE Filed Vitals:   07/07/13 0523 07/07/13 1500 07/07/13 2026 07/08/13 0617  BP: 144/74 91/55 119/58 136/73  Pulse: 69 64 63 62  Temp: 97.3 F (36.3 C) 98 F (36.7 C) 97.5 F (36.4 C) 97.4 F (36.3 C)  TempSrc: Oral Oral Oral Oral  Resp: 18 18 17 17   Height:      Weight:    183 lb 3.2 oz (83.1 kg)  SpO2: 94% 94% 96%     Intake/Output Summary (Last 24 hours) at 07/08/13 0815 Last data filed at 07/08/13 0618  Gross per 24 hour  Intake    596 ml  Output   1950 ml  Net  -1354 ml   Filed Weights   07/06/13 0700 07/07/13 0420 07/08/13 0617  Weight: 187 lb 6.3 oz (85 kg) 184 lb 1.4 oz (83.5 kg) 183 lb 3.2 oz (83.1 kg)    PHYSICAL EXAM General: Well developed, well nourished, male in no acute distress. Head: Normocephalic, atraumatic.  Neck: Supple without bruits, JVD at 10 cm. Lungs:  Resp regular and unlabored, rales bases. Heart: irregular at times, S1, S2, no S3, S4, 2+ murmur; no rub. Abdomen: Soft, non-tender, non-distended, BS + x 4.  Extremities: No clubbing, cyanosis, no edema.  Neuro: Alert and oriented X 3. Moves all extremities spontaneously. Psych: Normal affect.  LABS: CBC: Recent Labs  07/07/13 0500 07/08/13 0615  WBC 5.3 5.7  HGB 12.3* 11.8*  HCT 36.8* 36.4*  MCV 93.6 93.6  PLT 255 256   Basic Metabolic Panel: Recent Labs  07/07/13 0500 07/08/13 0615  NA 135 130*  K 4.9 4.8  CL 99 93*  CO2 33* 31  GLUCOSE 96 98  BUN 24* 22  CREATININE 1.11 1.14  CALCIUM 8.8 8.9   BNP: Pro B Natriuretic peptide (BNP)  Date/Time Value  Range Status  07/02/2013  4:12 PM 2389.0* 0 - 450 pg/mL Final  04/19/2012  1:58 PM 2303.0* 0 - 450 pg/mL Final    TELE:    Radiology/Studies: Dg Chest 2 View 07/02/2013   CLINICAL DATA:  Leg swelling, history of CHF  EXAM: CHEST  2 VIEW  COMPARISON:  Prior chest x-ray 06/07/2012  FINDINGS: Stable cardiomegaly. Atherosclerotic calcifications noted in the transverse aorta. Left subclavian approach cardiac rhythm maintenance device with leads projecting over the right atrium and right ventricle. Mild pulmonary vascular congestion, slightly progressed from prior but without overt edema. Elevation of left hemidiaphragm with mild lingular atelectasis. No pneumothorax or large pleural effusion. No acute osseous abnormality. Central bronchitic changes are similar compared to prior.  IMPRESSION: Mild pulmonary vascular congestion without overt edema.  Stable cardiomegaly.   Electronically Signed   By: Malachy Moan M.D.   On: 07/02/2013 17:22    Current Medications:  . collagenase   Topical Daily  . digoxin  125 mcg Oral Daily  . dorzolamide-timolol  1 drop Both Eyes Daily  . feeding supplement (PRO-STAT SUGAR FREE 64)  30 mL Oral TID WC  . finasteride  5 mg Oral Daily  . furosemide  40 mg Intravenous Daily  .  heparin  5,000 Units Subcutaneous Q8H  . levothyroxine  150 mcg Oral QAC breakfast  . midodrine  10 mg Oral TID WC  . pyridostigmine  60 mg Oral TID WC  . sodium chloride  3 mL Intravenous Q12H  . sulfamethoxazole-trimethoprim  1 tablet Oral Q12H      ASSESSMENT AND PLAN: Principal Problem:   Acute exacerbation of CHF (congestive heart failure) Active Problems:   DYSAUTONOMIA   Orthostasis   Myasthenia gravis   PAF (paroxysmal atrial fibrillation)   Skin ulcer   Decubitus ulcer of sacral region, stage 3   Fall at home  79 yo with history of diastolic CHF, persistent atrial fibrillation, tachy-brady syndrome s/p St Jude PCM, and autonomic dysfunction presented initially with  acute on chronic diastolic CHF exacerbation.   1. Orthostatic hypotension: Long-standing. Has been thought to have autonomic insufficiency. This is a very limiting symptom.  - He continues on midodrine and pyridostigmine. Would leave these medications at current doses.  - Florinef stopped given CHF.  - Flomax stopped yesterday  - Would hold off on compression stockings for now given wound on left leg.   2. Atrial fibrillation: V-paced. Rate is reasonable. He is not anticoagulated (high fall risk) and has refused anticoagulation anyway.    3. Acute on chronic diastolic CHF: Patient is volume overloaded on exam. He diuresed 12 lbs since admit,  He still has crackles at bases. His lasix was stopped 11/28 due to orthostasis. He needs fluid off to help him breathe better.Will give IV Lasix 40 mg daily and follow response.   4. Sacral decubitus: per primary team.   5. Agree with palliative care consult. I think that long-term his prognosis is poor.   6. Left leg wound - can get skin care consult in am, consider Unna boot if not healing - per primary  Charlton Haws

## 2013-07-08 NOTE — Progress Notes (Signed)
Family Medicine Teaching Service Daily Progress Note Intern Pager: [phone]  Patient name: Marco Gregory Medical record number: [id number] Date of birth: 06/20/1921 Age: 79 y.o. Gender: male  Primary Care Provider: Elvina Sidle, MD Consultants: Cardiology, Surgery Code Status: DNR  Pt Overview and Major Events to Date:   Assessment and Plan: Marco Gregory is a 79 y.o. male with Paroxysmal atrial fibrillation s/p Pacemaker, Diastolic CHF, and COPD presents with worsening SOB and frequent falls.   # Dyspnea - Likely CHF exacerbation given elevated BNP (2389), rales on PE, and LE edema. Patient was given IV Lasix 40 mg in the ED. Long history of dysautonomia and low BP.  - Hgb stable at 11.8 today - Echo: mild LV hypertrophy. EF 55%. Mildly dilated RV with normal systolic function. mild aortic stenosis - Elevate legs - Strict I/O's: OUP yesterday 1950cc (1cc/kg/hr); Wt 183 lb  (202 admit) - Cardiology   Palliative care consulted  Follow-up case management recs: Given info on home health - cardiology consulted CHF team per family request, follow-up recommendations  Flomax discontinued   Diuresis per HF team: Lasix restarted yesterday. Continue today  # LE ulcer and Sacral Decubitus ulcer (Stage 3)  - Wound care consulted; May need to image sacrum for Osteomyelitis or broaden abx to cover Group A if spikes fever - WBC wnl on 11/30 - ABI ; Left ABI indicates mild calcification of the pedal vessels with normal Doppler waveforms - Bactrim: day 6/7 - Surgery consult for discussion of Sacral Ulcer debridement   Will reassess on Monday 12/1 for possible need of operative debridement (Dr. Jamey Ripa will evaluate).  # Falls - Have been worsening recently. Likely secondary to orthostasis and deconditioning/weakness.  - PT/OT: Rec 24 hr assistance vs SNF - SW and palliative consulted - Patient wishes to go home  # UTI - UA revealed moderate leukocytes. Patient endorsing  some dysuria. Patient was given CTX in the ED.  - Cx: NG x 5 days - No dysuria: UTI covered by cellulitis abx   # Paroxysmal atrial fibrillation  - Dig level subtheraputic on admit: 0.5 - Currently in Afib w/o RVR - Continuing home Digoxin   # Dysautonomia and hypotension  - discontinued Florinef in the setting of diuresis as it increases sodium reabsorption (increasing volume)  - continue Midodrine 10mg  TID  # Hypothyroidism  - Continuing home synthroid   # Myasthenia  - This is documented in the medical record but per patient and daughter, they are unaware of this condition.  - Will continue [place].   FEN/GI: Saline Lock IV; Heart Healthy Diet  Prophylaxis: Heparin SQ   Disposition: Pending clinical improvement, surgery recommendations, CHF team evaluation and home health equipment  Subjective: Patient reporting some neck and back pain. No chest pain. Has had some shortness of breath overnight, but currently not having symptoms..    Objective: Temp:  [97.4 F (36.3 C)-98 F (36.7 C)] 97.4 F (36.3 C) (11/30 0617) Pulse Rate:  [62-72] 72 (11/30 0944) Resp:  [17-18] 17 (11/30 0617) BP: (91-136)/(55-73) 136/73 mmHg (11/30 0617) SpO2:  [94 %-96 %] 96 % (11/29 2026) Weight:  [183 lb 3.2 oz (83.1 kg)] 183 lb 3.2 oz (83.1 kg) (11/30 0617)  Physical Exam: General: laying in bed, in no acute distress, pleasant, looks comfortable  Cardiovascular: Irregular rhythm. 1/6 systolic murmur noted.  Respiratory: Mild bibasilar rales noted  Extremities: Lower extremities ~4 cm circular wound noted on the left lateral lower leg; Purpura also noted on left lower  leg which is much improved. Right leg wound improved from previous exams. Minimal bilateral pitting edema Neuro: A&O x 3.  Laboratory:  Recent Labs Lab 07/06/13 0550 07/07/13 0500 07/08/13 0615  WBC 6.3 5.3 5.7  HGB 11.8* 12.3* 11.8*  HCT 35.8* 36.8* 36.4*  PLT 260 255 256    Recent Labs Lab 07/03/13 0615   07/06/13 1528 07/07/13 0500 07/08/13 0615  NA 138  < > 137 135 130*  K 4.1  < > 4.6 4.9 4.8  CL 98  < > 98 99 93*  CO2 31  < > 32 33* 31  BUN 15  < > 21 24* 22  CREATININE 1.07  < > 1.12 1.11 1.14  CALCIUM 8.7  < > 8.9 8.8 8.9  PROT 6.4  --   --   --   --   BILITOT 0.9  --   --   --   --   ALKPHOS 84  --   --   --   --   ALT 14  --   --   --   --   AST 22  --   --   --   --   GLUCOSE 84  < > 119* 96 98  < > = values in this interval not displayed. Imaging/Diagnostic Tests:  No recent imaging/diagnostic tests  Jacquelin Hawking, MD 07/08/2013, 9:58 AM PGY-1, [facility] Family Medicine FPTS Intern pager: [phone], text pages welcome

## 2013-07-08 NOTE — Progress Notes (Signed)
FMTS Attending Note  I personally saw and evaluated the patient. The plan of care was discussed with the resident team. I agree with the assessment and plan as documented by the resident.   Patient currently alert and oriented X4, no acute issues, denies dyspnea at this time, no chest pain, tolerating breakfast  1. CHF exacerbation/dyspnea - improved, diuresis per HF team 2. Hx. Of orthostasis/dysautonomia - blood pressure has tolerated diuresis to this point, agree with discontinuation of Flomax, monitor BP's, continue Midodrine 3. Sacral Decubitus Ulcer - unstagable, surgery to take patient for debridement on 12/1 4. LE venous stasis ulcers with cellulitis - improved, continue local wound care, complete 7 days of Bactrim 5. Atrial Fibrillation - currently rate controlled, on Digoxin, not a candidate for anticoagulation due to fall risk  Donnella Sham MD

## 2013-07-09 LAB — BASIC METABOLIC PANEL
Calcium: 9.3 mg/dL (ref 8.4–10.5)
GFR calc Af Amer: 60 mL/min — ABNORMAL LOW (ref >90)
GFR calc non Af Amer: 52 mL/min — ABNORMAL LOW (ref >90)
Potassium: 5.2 mEq/L — ABNORMAL HIGH (ref 3.5–5.1)
Sodium: 134 mEq/L — ABNORMAL LOW (ref 135–145)

## 2013-07-09 LAB — CBC
Hemoglobin: 12.8 g/dL — ABNORMAL LOW (ref 13.0–17.0)
MCH: 30.6 pg (ref 26.0–34.0)
MCHC: 32.8 g/dL (ref 30.0–36.0)
Platelets: 266 10*3/uL (ref 150–400)
RDW: 13.6 % (ref 11.5–15.5)
WBC: 8 10*3/uL (ref 4.0–10.5)

## 2013-07-09 NOTE — Progress Notes (Signed)
Patient Name: Marco Gregory Date of Encounter: 07/09/2013     Principal Problem:   Acute exacerbation of CHF (congestive heart failure) Active Problems:   DYSAUTONOMIA   Orthostasis   Myasthenia gravis   PAF (paroxysmal atrial fibrillation)   Skin ulcer   Decubitus ulcer of sacral region, stage 3   Fall at home    SUBJECTIVE  Very dizzy this am when PT tried to stand him up.  No chest pain.  CURRENT MEDS . collagenase   Topical Daily  . digoxin  125 mcg Oral Daily  . docusate sodium  100 mg Oral BID  . dorzolamide-timolol  1 drop Both Eyes Daily  . feeding supplement (PRO-STAT SUGAR FREE 64)  30 mL Oral TID WC  . finasteride  5 mg Oral Daily  . furosemide  40 mg Intravenous Daily  . heparin  5,000 Units Subcutaneous Q8H  . levothyroxine  150 mcg Oral QAC breakfast  . midodrine  10 mg Oral TID WC  . pyridostigmine  60 mg Oral TID WC  . sodium chloride  3 mL Intravenous Q12H  . sulfamethoxazole-trimethoprim  1 tablet Oral Q12H    OBJECTIVE  Filed Vitals:   07/08/13 0944 07/08/13 1330 07/08/13 1949 07/09/13 0454  BP:  94/55 104/66 104/84  Pulse: 72 64 69 65  Temp:  97.8 F (36.6 C) 97.6 F (36.4 C) 97.7 F (36.5 C)  TempSrc:  Oral Oral Oral  Resp:  18 20 20   Height:      Weight:    178 lb 2.1 oz (80.8 kg)  SpO2:  97% 99% 95%    Intake/Output Summary (Last 24 hours) at 07/09/13 0817 Last data filed at 07/09/13 0719  Gross per 24 hour  Intake    238 ml  Output   2380 ml  Net  -2142 ml   Filed Weights   07/07/13 0420 07/08/13 0617 07/09/13 0454  Weight: 184 lb 1.4 oz (83.5 kg) 183 lb 3.2 oz (83.1 kg) 178 lb 2.1 oz (80.8 kg)    PHYSICAL EXAM  General: Pleasant, NAD. Neuro: Alert and oriented X 3. Moves all extremities spontaneously. Psych: Normal affect. HEENT:  Normal  Neck: Supple without bruits or JVD. Lungs:  Resp regular and unlabored, CTA. Heart: Grade 2/6 systolic murmur at base. Abdomen: Soft, non-tender, non-distended, BS + x 4.    Extremities: No clubbing, cyanosis or edema.  Accessory Clinical Findings  CBC  Recent Labs  07/08/13 0615 07/09/13 0718  WBC 5.7 8.0  HGB 11.8* 12.8*  HCT 36.4* 39.0  MCV 93.6 93.3  PLT 256 266   Basic Metabolic Panel  Recent Labs  07/07/13 0500 07/08/13 0615  NA 135 130*  K 4.9 4.8  CL 99 93*  CO2 33* 31  GLUCOSE 96 98  BUN 24* 22  CREATININE 1.11 1.14  CALCIUM 8.8 8.9   Liver Function Tests No results found for this basename: AST, ALT, ALKPHOS, BILITOT, PROT, ALBUMIN,  in the last 72 hours No results found for this basename: LIPASE, AMYLASE,  in the last 72 hours Cardiac Enzymes No results found for this basename: CKTOTAL, CKMB, CKMBINDEX, TROPONINI,  in the last 72 hours BNP No components found with this basename: POCBNP,  D-Dimer No results found for this basename: DDIMER,  in the last 72 hours Hemoglobin A1C No results found for this basename: HGBA1C,  in the last 72 hours Fasting Lipid Panel No results found for this basename: CHOL, HDL, LDLCALC, TRIG, CHOLHDL, LDLDIRECT,  in the last 72 hours Thyroid Function Tests No results found for this basename: TSH, T4TOTAL, FREET3, T3FREE, THYROIDAB,  in the last 72 hours  TELE  Not on tele  ECG    Radiology/Studies  Dg Chest 2 View  07/02/2013   CLINICAL DATA:  Leg swelling, history of CHF  EXAM: CHEST  2 VIEW  COMPARISON:  Prior chest x-ray 06/07/2012  FINDINGS: Stable cardiomegaly. Atherosclerotic calcifications noted in the transverse aorta. Left subclavian approach cardiac rhythm maintenance device with leads projecting over the right atrium and right ventricle. Mild pulmonary vascular congestion, slightly progressed from prior but without overt edema. Elevation of left hemidiaphragm with mild lingular atelectasis. No pneumothorax or large pleural effusion. No acute osseous abnormality. Central bronchitic changes are similar compared to prior.  IMPRESSION: Mild pulmonary vascular congestion without  overt edema.  Stable cardiomegaly.   Electronically Signed   By: Malachy Moan M.D.   On: 07/02/2013 17:22   Ct Head Wo Contrast  07/02/2013   CLINICAL DATA:  Left-sided neck pain, recent falls  EXAM: CT HEAD WITHOUT CONTRAST  CT CERVICAL SPINE WITHOUT CONTRAST  TECHNIQUE: Multidetector CT imaging of the head and cervical spine was performed following the standard protocol without intravenous contrast. Multiplanar CT image reconstructions of the cervical spine were also generated.  COMPARISON:  04/19/2012  FINDINGS: CT HEAD FINDINGS  Mild soft tissue changes are noted within the left maxillary antrum likely of a chronic nature. The bony calvarium is intact. Atrophic changes are identified as well as chronic white matter ischemic change. No findings to suggest acute hemorrhage, acute infarction or space-occupying mass lesion are noted.  CT CERVICAL SPINE FINDINGS  Seven cervical segments are well visualized. Vertebral body height is well maintained. Multilevel facet hypertrophic changes are noted. No acute fracture or acute facet abnormality is noted. The surrounding soft tissues are within normal limits with the exception of carotid calcifications.  IMPRESSION: CT head:  Chronic changes without acute abnormality.  CT of the cervical spine: Degenerative change without acute abnormality.   Electronically Signed   By: Alcide Clever M.D.   On: 07/02/2013 19:07   Ct Cervical Spine Wo Contrast  07/02/2013   CLINICAL DATA:  Left-sided neck pain, recent falls  EXAM: CT HEAD WITHOUT CONTRAST  CT CERVICAL SPINE WITHOUT CONTRAST  TECHNIQUE: Multidetector CT imaging of the head and cervical spine was performed following the standard protocol without intravenous contrast. Multiplanar CT image reconstructions of the cervical spine were also generated.  COMPARISON:  04/19/2012  FINDINGS: CT HEAD FINDINGS  Mild soft tissue changes are noted within the left maxillary antrum likely of a chronic nature. The bony calvarium  is intact. Atrophic changes are identified as well as chronic white matter ischemic change. No findings to suggest acute hemorrhage, acute infarction or space-occupying mass lesion are noted.  CT CERVICAL SPINE FINDINGS  Seven cervical segments are well visualized. Vertebral body height is well maintained. Multilevel facet hypertrophic changes are noted. No acute fracture or acute facet abnormality is noted. The surrounding soft tissues are within normal limits with the exception of carotid calcifications.  IMPRESSION: CT head:  Chronic changes without acute abnormality.  CT of the cervical spine: Degenerative change without acute abnormality.   Electronically Signed   By: Alcide Clever M.D.   On: 07/02/2013 19:07    ASSESSMENT AND PLAN 79 yo with history of diastolic CHF, persistent atrial fibrillation, tachy-brady syndrome s/p St Jude PCM, and autonomic dysfunction presented initially with acute on chronic diastolic  CHF exacerbation.  1. Orthostatic hypotension: Long-standing. Has been thought to have autonomic insufficiency. This is a very limiting symptom.  - He continues on midodrine and pyridostigmine. Would leave these medications at current doses.   - I would hold off on compression stockings for now given wound on left leg.  - I am not sure that we are going to make this particular problem much better in the long run.  2. Atrial fibrillation: V-paced. Rate is reasonable. He is not anticoagulated (high fall risk) and has refused anticoagulation anyway.  3. Acute on chronic diastolic CHF: Weight is down 6 lb in past 2 days. More orthostatic today. Will hold Lasix today. BP remains soft. 4. Sacral decubitus: per primary team. Possible operative debridement this am. 5. Agree with palliative care consult. I think that long-term his prognosis is poor.     Signed, Cassell Clement  MD

## 2013-07-09 NOTE — Progress Notes (Signed)
Agree with A&P of MD,PA. This does not look to need surgical debridement at this time

## 2013-07-09 NOTE — Progress Notes (Signed)
Physical Therapy Treatment Patient Details Name: Marco Gregory MRN: [id number] DOB: 1920-11-12 Today's Date: 07/09/2013 Time: 8657-8469 PT Time Calculation (min): 12 min  PT Assessment / Plan / Recommendation  History of Present Illness Marco Gregory is a 79 y.o. male with Paroxysmal atrial fibrillation s/p Pacemaker, Diastolic CHF, and COPD presents with worsening SOB and frequent falls.   PT Comments   Pt limited by dizziness this session.  Unable to tolerate standing > 1 minute, needed laying rest after few attempts standing.  Follow Up Recommendations  Supervision/Assistance - 24 hour;Home health PT     Does the patient have the potential to tolerate intense rehabilitation     Barriers to Discharge        Equipment Recommendations  None recommended by PT    Recommendations for Other Services    Frequency Min 3X/week   Progress towards PT Goals Progress towards PT goals: Progressing toward goals  Plan Current plan remains appropriate    Precautions / Restrictions Precautions Precautions: Fall Restrictions Weight Bearing Restrictions: No   Pertinent Vitals/Pain Pt c/o pain in wound, RN/MD aware.    Mobility  Bed Mobility Bed Mobility: Scooting to HOB Supine to Sit: 4: Min assist;With rails;HOB elevated Sitting - Scoot to Edge of Bed: 4: Min assist Sit to Supine: 4: Min assist Scooting to [facility]: 4: Min assist;With rail Details for Bed Mobility Assistance: assist for trunk control Transfers Sit to Stand: 4: Min assist Stand to Sit: 4: Min assist Details for Transfer Assistance: cues for UE placement with RW, pt requires assist for forward wt shift for momentum to stand. Performed multiple reps, pt limited by dizziness    Exercises     PT Diagnosis:    PT Problem List:   PT Treatment Interventions:     PT Goals (current goals can now be found in the care plan section)    Visit Information  Last PT Received On: 07/09/13 Assistance Needed:  +1 History of Present Illness: Marco Gregory is a 79 y.o. male with Paroxysmal atrial fibrillation s/p Pacemaker, Diastolic CHF, and COPD presents with worsening SOB and frequent falls.    Subjective Data      Cognition  Cognition Arousal/Alertness: Awake/alert Behavior During Therapy: WFL for tasks assessed/performed    Balance  Static Standing Balance Static Standing - Balance Support: Bilateral upper extremity supported;During functional activity Static Standing - Level of Assistance: 5: Stand by assistance Static Standing - Comment/# of Minutes: 3 x 1 minute, limited by dizziness.  After 3 x standing pt needed to lay down due to dizziness  End of Session PT - End of Session Equipment Utilized During Treatment: Gait belt Activity Tolerance: Patient limited by fatigue Patient left: in bed;with nursing/sitter in room;with call bell/phone within reach;with bed alarm set   GP     [facility] 07/09/2013, 9:27 AM

## 2013-07-09 NOTE — Progress Notes (Signed)
Pt daughter states PT said he was SOB couple times today. Pt did not tell RN or staff. Daughter ask that we put oxygen on. Pt not is distress or labored breathing, O2 stat wnl on RA. Put Pt on 2L for comfort Will continue to monitor

## 2013-07-09 NOTE — Progress Notes (Signed)
Pt resting comfortable on bed on 1L O2 for comfort. VSS denies any pain at this moment. We'll continue with POC.

## 2013-07-09 NOTE — Progress Notes (Signed)
Pt concern about no BM since 11-28 Rn will give prune juice and continue to monitor

## 2013-07-09 NOTE — Progress Notes (Signed)
Pt O4x, states back pain PRN given will continue to monitor

## 2013-07-09 NOTE — Progress Notes (Signed)
FMTS Attending Daily Note:  Jeff Jullie Arps MD  [phone] pager  Family Practice pager:  [phone] I have discussed this patient with the resident Dr. Karamalegos.  I agree with their findings, assessment, and care plan  

## 2013-07-09 NOTE — Progress Notes (Signed)
  Subjective: Pt doing well, still having pain on his sacrum.  No fevers or leukocytosis, does not appear septic.  Mobilizing, tolerating dressing change.  Objective: Vital signs in last 24 hours: Temp:  [97.6 F (36.4 C)-97.8 F (36.6 C)] 97.7 F (36.5 C) (12/01 0454) Pulse Rate:  [64-69] 65 (12/01 0454) Resp:  [18-20] 20 (12/01 0454) BP: (94-104)/(55-84) 104/84 mmHg (12/01 0454) SpO2:  [95 %-99 %] 95 % (12/01 0454) Weight:  [178 lb 2.1 oz (80.8 kg)] 178 lb 2.1 oz (80.8 kg) (12/01 0454) Last BM Date: 07/06/13  Intake/Output from previous day: 11/30 0701 - 12/01 0700 In: 238 [P.O.:238] Out: 2080 [Urine:2080] Intake/Output this shift: Total I/O In: 0  Out: 300 [Urine:300]  PE: Gen:  Alert, NAD, pleasant Sacrum:  3 x 4cm sacral decubitus covered with yellow slough, no eschar, no cellulitis, fluctuance or purulent drainage, seems improved per nursing staff from Friday, no significant pain to palpation  Lab Results:   Recent Labs  07/08/13 0615 07/09/13 0718  WBC 5.7 8.0  HGB 11.8* 12.8*  HCT 36.4* 39.0  PLT 256 266   BMET  Recent Labs  07/08/13 0615 07/09/13 0718  NA 130* 134*  K 4.8 5.2*  CL 93* 96  CO2 31 32  GLUCOSE 98 109*  BUN 22 24*  CREATININE 1.14 1.19  CALCIUM 8.9 9.3   PT/INR No results found for this basename: LABPROT, INR,  in the last 72 hours CMP     Component Value Date/Time   NA 134* 07/09/2013 0718   K 5.2* 07/09/2013 0718   CL 96 07/09/2013 0718   CO2 32 07/09/2013 0718   GLUCOSE 109* 07/09/2013 0718   BUN 24* 07/09/2013 0718   CREATININE 1.19 07/09/2013 0718   CREATININE 1.06 06/07/2012 1603   CALCIUM 9.3 07/09/2013 0718   PROT 6.4 07/03/2013 0615   ALBUMIN 2.7* 07/03/2013 0615   AST 22 07/03/2013 0615   ALT 14 07/03/2013 0615   ALKPHOS 84 07/03/2013 0615   BILITOT 0.9 07/03/2013 0615   GFRNONAA 52* 07/09/2013 0718   GFRAA 60* 07/09/2013 0718   Lipase  No results found for this basename: lipase       Studies/Results: No  results found.  Anti-infectives: Anti-infectives   Start     Dose/Rate Route Frequency Ordered Stop   07/03/13 1500  sulfamethoxazole-trimethoprim (BACTRIM DS) 800-160 MG per tablet 1 tablet     1 tablet Oral Every 12 hours 07/03/13 1427     07/03/13 1245  sulfamethoxazole-trimethoprim (BACTRIM DS) 800-160 MG per tablet 1 tablet  Status:  Discontinued     1 tablet Oral Every 12 hours 07/03/13 1243 07/03/13 1427   07/02/13 1930  cefTRIAXone (ROCEPHIN) 1 g in dextrose 5 % 50 mL IVPB     1 g 100 mL/hr over 30 Minutes Intravenous  Once 07/02/13 1920 07/02/13 2050       Assessment/Plan Stage III sacral decubitus ulcer - noninfected -santyl  -no surgical intervention at present time -cont pain meds as needed -should not need extensive antibiotic regimen -restart diet    LOS: 7 days    DORT, Marco Gregory 07/09/2013, 10:16 AM Pager: [phone]

## 2013-07-09 NOTE — Progress Notes (Signed)
Family Medicine Teaching Service Daily Progress Note Intern Pager: [phone]  Patient name: Marco Gregory Medical record number: [id number] Date of birth: [birth year] Age: 79 y.o. Gender: male  Primary Care Provider: Elvina Sidle, MD Consultants: Cardiology, Surgery, Palliative Care Code Status: DNR  Pt Overview and Major Events to Date:   07/09/13 - wt 178 (183) down 6 lbs in 2 days, +orthostatic, Hold Lasix, no surgical debridement  Assessment and Plan: Marco Gregory is a 79 y.o. male with Paroxysmal atrial fibrillation s/p Pacemaker, Diastolic CHF, and COPD presents with worsening SOB and frequent falls.   # Dyspnea - Suspected secondary to CHF exacerbation given elevated BNP (2389), rales on PE, and LE edema. Patient was given IV Lasix 40 mg in the ED. Long history of dysautonomia and low BP.  - Hgb stable at 12.8 today - Echo: mild LV hypertrophy. EF 55%. Mildly dilated RV with normal systolic function. mild aortic stenosis - Elevate legs - Strict I/O's: OUP yesterday 2080cc (1.1cc/kg/hr); Wt 178 lb  (202 admit) - continue fluid restriction - Cardiology   Palliative care consulted - interested in 24 hr home health  Follow-up case management recs: Given info on home health - cardiology consulted CHF team per family request, follow-up recommendations  Flomax discontinued   Diuresis per HF team: Lasix held today (previously resumed Lasix 40 IV)  Re-evaluate fluid status in AM, and appreciate recs for diuretic plan on discharge  # LE ulcer and Sacral Decubitus ulcer (Stage 3)  - Wound care consulted; May need to image sacrum for Osteomyelitis or broaden abx to cover Group A if spikes fever - Less concerning for infection vs osteo, will not proceed with MRI at this time - WBC 8.0 (5.7) stable - ABI ; Left ABI indicates mild calcification of the pedal vessels with normal Doppler waveforms - Bactrim: day 7/7 (complete) - Surgery consult for discussion of Sacral  Ulcer debridement   Will reassess on Monday 12/1 for possible need of operative debridement (Dr. Jamey Ripa will evaluate).  Initial recommendation is  - Foley Cath in place on admission (d/t Sacral decub) - leave in place until eval per surgery 12/1  # Falls - Have been worsening recently. Likely secondary to orthostasis and deconditioning/weakness.  - PT/OT: Rec 24 hr assistance vs SNF - SW and palliative consulted - Patient wishes to go home  # UTI - UA revealed moderate leukocytes. Patient endorsing some dysuria. Patient was given CTX in the ED.  - Cx: NG x 5 days - No dysuria: UTI covered by cellulitis abx   # Paroxysmal atrial fibrillation  - Dig level subtheraputic on admit: 0.5 - Currently in Afib w/o RVR - Continuing home Digoxin  # Dysautonomia and hypotension, suspect multifactorial etiology likely autonomic dysfunction and age - discontinued Florinef in the setting of diuresis as it increases sodium reabsorption (increasing volume)  - continue Midodrine 10mg  TID - continue +dizzy when stand, suspect   # Hypothyroidism  - Continuing home synthroid   # Myasthenia  - This is documented in the medical record but per patient and daughter, they are unaware of this condition.  - Will continue [place].   FEN/GI: Saline Lock IV; Heart Healthy Diet (no longer NPO), fluid restriction Prophylaxis: Heparin SQ   Disposition: Pending clinical improvement, surgery recommendations, CHF team evaluation and home health equipment - contacted CM, home health arrangements already made. Hospital bed arrived at patient's house. No further pending issues prior to discharge with home health and family supervision  Subjective:  Patient continues to report some chronic upper back and b/l shoulder pain, Tramadol helps in past. Denies any active CP or dyspnea. Overall, feels better today. Does admit to some dizziness earlier when standing when working w/ PT/OT. Has not had a BM x 3 days. Good  UOP.  Objective: Temp:  [97.6 F (36.4 C)-97.8 F (36.6 C)] 97.7 F (36.5 C) (12/01 0454) Pulse Rate:  [64-72] 65 (12/01 0454) Resp:  [18-20] 20 (12/01 0454) BP: (94-104)/(55-84) 104/84 mmHg (12/01 0454) SpO2:  [95 %-99 %] 95 % (12/01 0454) Weight:  [178 lb 2.1 oz (80.8 kg)] 178 lb 2.1 oz (80.8 kg) (12/01 0454)  Physical Exam: General: laying in bed, pleasant and conversational, appears comfortable, NAD Neck: no significant JVD, non-tender  Cardiovascular: Irregularly irregular rhythm. 1/6 systolic murmur noted.  Respiratory: Mild bibasilar crackles (improved). Good air movement b/l. Normal work of breathing Extremities: +1 b/l pitting edema (improved). Lower extremities ~4 cm circular wound noted on the left lateral lower leg (currently dressed); Purpura also noted on left lower leg which is much improved. Right leg wound improved from previous exams. Neuro: awake, alert, oriented  Laboratory:  Recent Labs Lab 07/07/13 0500 07/08/13 0615 07/09/13 0718  WBC 5.3 5.7 8.0  HGB 12.3* 11.8* 12.8*  HCT 36.8* 36.4* 39.0  PLT 255 256 266    Recent Labs Lab 07/03/13 0615  07/07/13 0500 07/08/13 0615 07/09/13 0718  NA 138  < > 135 130* 134*  K 4.1  < > 4.9 4.8 5.2*  CL 98  < > 99 93* 96  CO2 31  < > 33* 31 32  BUN 15  < > 24* 22 24*  CREATININE 1.07  < > 1.11 1.14 1.19  CALCIUM 8.7  < > 8.8 8.9 9.3  PROT 6.4  --   --   --   --   BILITOT 0.9  --   --   --   --   ALKPHOS 84  --   --   --   --   ALT 14  --   --   --   --   AST 22  --   --   --   --   GLUCOSE 84  < > 96 98 109*  < > = values in this interval not displayed. Imaging/Diagnostic Tests:  No recent imaging/diagnostic tests  Saralyn Pilar, DO 07/09/2013, 8:58 AM PGY-1, [facility] Family Medicine FPTS Intern pager: [phone], text pages welcome

## 2013-07-10 ENCOUNTER — Telehealth: Payer: Self-pay | Admitting: Radiology

## 2013-07-10 LAB — BASIC METABOLIC PANEL
BUN: 31 mg/dL — ABNORMAL HIGH (ref 6–23)
CO2: 30 mEq/L (ref 19–32)
Calcium: 9.3 mg/dL (ref 8.4–10.5)
Calcium: 9.6 mg/dL (ref 8.4–10.5)
Chloride: 95 mEq/L — ABNORMAL LOW (ref 96–112)
GFR calc Af Amer: 56 mL/min — ABNORMAL LOW (ref >90)
GFR calc Af Amer: 60 mL/min — ABNORMAL LOW (ref >90)
GFR calc non Af Amer: 48 mL/min — ABNORMAL LOW (ref >90)
GFR calc non Af Amer: 52 mL/min — ABNORMAL LOW (ref >90)
Glucose, Bld: 117 mg/dL — ABNORMAL HIGH (ref 70–99)
Glucose, Bld: 136 mg/dL — ABNORMAL HIGH (ref 70–99)
Potassium: 5.9 mEq/L — ABNORMAL HIGH (ref 3.5–5.1)
Potassium: 5.7 mEq/L — ABNORMAL HIGH (ref 3.5–5.1)
Sodium: 130 mEq/L — ABNORMAL LOW (ref 135–145)
Sodium: 130 mEq/L — ABNORMAL LOW (ref 135–145)

## 2013-07-10 LAB — CBC
Hemoglobin: 14.1 g/dL (ref 13.0–17.0)
MCH: 31.1 pg (ref 26.0–34.0)
MCHC: 33 g/dL (ref 30.0–36.0)
Platelets: 275 10*3/uL (ref 150–400)
RBC: 4.54 MIL/uL (ref 4.22–5.81)
WBC: 10.9 10*3/uL — ABNORMAL HIGH (ref 4.0–10.5)

## 2013-07-10 MED ORDER — CALCIUM CARBONATE ANTACID 500 MG PO CHEW
1.0000 | CHEWABLE_TABLET | Freq: Two times a day (BID) | ORAL | Status: DC
  Administered 2013-07-10 – 2013-07-12 (×4): 200 mg via ORAL
  Filled 2013-07-10 (×6): qty 1

## 2013-07-10 MED ORDER — SODIUM POLYSTYRENE SULFONATE 15 GM/60ML PO SUSP
15.0000 g | Freq: Once | ORAL | Status: AC
  Administered 2013-07-10: 15 g via ORAL
  Filled 2013-07-10: qty 60

## 2013-07-10 MED ORDER — ENSURE PUDDING PO PUDG
1.0000 | Freq: Two times a day (BID) | ORAL | Status: DC
  Administered 2013-07-10 – 2013-07-12 (×5): 1 via ORAL

## 2013-07-10 NOTE — Progress Notes (Signed)
NUTRITION FOLLOW UP  Intervention:   - Continue Prostat 30 ml TID, provides 300 kcal, 45 g protein - Ensure Pudding BID, each provides 170 kcal, 4 g protein - Continue to encourage adequate oral intake.   Nutrition Dx:   Inadequate oral intake related to decreased appetite as evidenced by <25% meal intake, improved  Goal:   Patient will meet >/=90% of estimated nutrition needs, ongoing  Monitor:   PO intake, weight, labs, GOC  Assessment:   Patient with continued fair intake. Intake has varied over the last week 25-85% (averaging around 50% of meals). Weight down 9 pounds due to diuresis. Palliative care meeting, long term prognosis poor, patient and family expecting full course of standard treatment.  No surgical debridement of wound necessary per surgery.   Height: Ht Readings from Last 1 Encounters:  07/02/13 6\' 3"  (1.905 m)    Weight Status:   Wt Readings from Last 1 Encounters:  07/10/13 176 lb 12.9 oz (80.2 kg)    Re-estimated needs:  Kcal: 1450-1600 kcal  Protein: 55-65 g  Fluid: 1.5 L/day  Skin: Stage 3 decubitus sacrum ulcer, 1+ pitting edema  Diet Order: Cardiac   Intake/Output Summary (Last 24 hours) at 07/10/13 1235 Last data filed at 07/10/13 1155  Gross per 24 hour  Intake    775 ml  Output   1580 ml  Net   -805 ml    Last BM: 12/2   Labs:   Recent Labs Lab 07/08/13 0615 07/09/13 0718 07/10/13 0555  NA 130* 134* 130*  K 4.8 5.2* 5.9*  CL 93* 96 95*  CO2 31 32 30  BUN 22 24* 26*  CREATININE 1.14 1.19 1.27  CALCIUM 8.9 9.3 9.3  GLUCOSE 98 109* 117*    CBG (last 3)  No results found for this basename: GLUCAP,  in the last 72 hours  Scheduled Meds: . collagenase   Topical Daily  . digoxin  125 mcg Oral Daily  . docusate sodium  100 mg Oral BID  . dorzolamide-timolol  1 drop Both Eyes Daily  . feeding supplement (PRO-STAT SUGAR FREE 64)  30 mL Oral TID WC  . finasteride  5 mg Oral Daily  . heparin  5,000 Units Subcutaneous Q8H   . levothyroxine  150 mcg Oral QAC breakfast  . midodrine  10 mg Oral TID WC  . pyridostigmine  60 mg Oral TID WC  . sodium chloride  3 mL Intravenous Q12H  . sulfamethoxazole-trimethoprim  1 tablet Oral Q12H    Continuous Infusions:   Linnell Fulling, RD, LDN Pager #: [phone] After-Hours Pager #: [phone]

## 2013-07-10 NOTE — Progress Notes (Signed)
Patient Name: Marco Gregory Date of Encounter: 07/10/2013     Principal Problem:   Acute exacerbation of CHF (congestive heart failure) Active Problems:   DYSAUTONOMIA   Orthostasis   Myasthenia gravis   PAF (paroxysmal atrial fibrillation)   Skin ulcer   Decubitus ulcer of sacral region, stage 3   Fall at home    SUBJECTIVE  No new complaints today. Was not out of bed yesterday. Denies chest pain. Dyspnea relieved by nasal oxygen.  CURRENT MEDS . collagenase   Topical Daily  . digoxin  125 mcg Oral Daily  . docusate sodium  100 mg Oral BID  . dorzolamide-timolol  1 drop Both Eyes Daily  . feeding supplement (PRO-STAT SUGAR FREE 64)  30 mL Oral TID WC  . finasteride  5 mg Oral Daily  . heparin  5,000 Units Subcutaneous Q8H  . levothyroxine  150 mcg Oral QAC breakfast  . midodrine  10 mg Oral TID WC  . pyridostigmine  60 mg Oral TID WC  . sodium chloride  3 mL Intravenous Q12H  . sulfamethoxazole-trimethoprim  1 tablet Oral Q12H    OBJECTIVE  Filed Vitals:   07/09/13 1032 07/09/13 1436 07/09/13 2100 07/10/13 0640  BP: 133/77 104/65  107/68  Pulse: 67 64 68 73  Temp: 97.4 F (36.3 C) 97.7 F (36.5 C) 97.5 F (36.4 C) 97.5 F (36.4 C)  TempSrc: Oral Oral Oral Oral  Resp: 20 18 18 18   Height:      Weight:    176 lb 12.9 oz (80.2 kg)  SpO2: 95% 95% 95% 94%    Intake/Output Summary (Last 24 hours) at 07/10/13 0724 Last data filed at 07/10/13 0500  Gross per 24 hour  Intake    775 ml  Output   1380 ml  Net   -605 ml   Filed Weights   07/08/13 0617 07/09/13 0454 07/10/13 0640  Weight: 183 lb 3.2 oz (83.1 kg) 178 lb 2.1 oz (80.8 kg) 176 lb 12.9 oz (80.2 kg)    PHYSICAL EXAM  General: Pleasant, NAD. Neuro: Alert and oriented X 3. Moves all extremities spontaneously. Psych: Normal affect. HEENT:  Normal  Neck: Supple without bruits or JVD. Lungs:  Resp regular and unlabored, CTA. Heart: Grade 2/6 systolic murmur at base. Abdomen: Soft,  non-tender, non-distended, BS + x 4.  Extremities: No clubbing, cyanosis or edema.  Accessory Clinical Findings  CBC  Recent Labs  07/09/13 0718 07/10/13 0555  WBC 8.0 10.9*  HGB 12.8* 14.1  HCT 39.0 42.7  MCV 93.3 94.1  PLT 266 275   Basic Metabolic Panel  Recent Labs  07/09/13 0718 07/10/13 0555  NA 134* 130*  K 5.2* 5.9*  CL 96 95*  CO2 32 30  GLUCOSE 109* 117*  BUN 24* 26*  CREATININE 1.19 1.27  CALCIUM 9.3 9.3   Liver Function Tests No results found for this basename: AST, ALT, ALKPHOS, BILITOT, PROT, ALBUMIN,  in the last 72 hours No results found for this basename: LIPASE, AMYLASE,  in the last 72 hours Cardiac Enzymes No results found for this basename: CKTOTAL, CKMB, CKMBINDEX, TROPONINI,  in the last 72 hours BNP No components found with this basename: POCBNP,  D-Dimer No results found for this basename: DDIMER,  in the last 72 hours Hemoglobin A1C No results found for this basename: HGBA1C,  in the last 72 hours Fasting Lipid Panel No results found for this basename: CHOL, HDL, LDLCALC, TRIG, CHOLHDL, LDLDIRECT,  in the  last 72 hours Thyroid Function Tests No results found for this basename: TSH, T4TOTAL, FREET3, T3FREE, THYROIDAB,  in the last 72 hours  TELE  Not on tele  ECG    Radiology/Studies  Dg Chest 2 View  07/02/2013   CLINICAL DATA:  Leg swelling, history of CHF  EXAM: CHEST  2 VIEW  COMPARISON:  Prior chest x-ray 06/07/2012  FINDINGS: Stable cardiomegaly. Atherosclerotic calcifications noted in the transverse aorta. Left subclavian approach cardiac rhythm maintenance device with leads projecting over the right atrium and right ventricle. Mild pulmonary vascular congestion, slightly progressed from prior but without overt edema. Elevation of left hemidiaphragm with mild lingular atelectasis. No pneumothorax or large pleural effusion. No acute osseous abnormality. Central bronchitic changes are similar compared to prior.  IMPRESSION:  Mild pulmonary vascular congestion without overt edema.  Stable cardiomegaly.   Electronically Signed   By: Malachy Moan M.D.   On: 07/02/2013 17:22   Ct Head Wo Contrast  07/02/2013   CLINICAL DATA:  Left-sided neck pain, recent falls  EXAM: CT HEAD WITHOUT CONTRAST  CT CERVICAL SPINE WITHOUT CONTRAST  TECHNIQUE: Multidetector CT imaging of the head and cervical spine was performed following the standard protocol without intravenous contrast. Multiplanar CT image reconstructions of the cervical spine were also generated.  COMPARISON:  04/19/2012  FINDINGS: CT HEAD FINDINGS  Mild soft tissue changes are noted within the left maxillary antrum likely of a chronic nature. The bony calvarium is intact. Atrophic changes are identified as well as chronic white matter ischemic change. No findings to suggest acute hemorrhage, acute infarction or space-occupying mass lesion are noted.  CT CERVICAL SPINE FINDINGS  Seven cervical segments are well visualized. Vertebral body height is well maintained. Multilevel facet hypertrophic changes are noted. No acute fracture or acute facet abnormality is noted. The surrounding soft tissues are within normal limits with the exception of carotid calcifications.  IMPRESSION: CT head:  Chronic changes without acute abnormality.  CT of the cervical spine: Degenerative change without acute abnormality.   Electronically Signed   By: Alcide Clever M.D.   On: 07/02/2013 19:07   Ct Cervical Spine Wo Contrast  07/02/2013   CLINICAL DATA:  Left-sided neck pain, recent falls  EXAM: CT HEAD WITHOUT CONTRAST  CT CERVICAL SPINE WITHOUT CONTRAST  TECHNIQUE: Multidetector CT imaging of the head and cervical spine was performed following the standard protocol without intravenous contrast. Multiplanar CT image reconstructions of the cervical spine were also generated.  COMPARISON:  04/19/2012  FINDINGS: CT HEAD FINDINGS  Mild soft tissue changes are noted within the left maxillary antrum  likely of a chronic nature. The bony calvarium is intact. Atrophic changes are identified as well as chronic white matter ischemic change. No findings to suggest acute hemorrhage, acute infarction or space-occupying mass lesion are noted.  CT CERVICAL SPINE FINDINGS  Seven cervical segments are well visualized. Vertebral body height is well maintained. Multilevel facet hypertrophic changes are noted. No acute fracture or acute facet abnormality is noted. The surrounding soft tissues are within normal limits with the exception of carotid calcifications.  IMPRESSION: CT head:  Chronic changes without acute abnormality.  CT of the cervical spine: Degenerative change without acute abnormality.   Electronically Signed   By: Alcide Clever M.D.   On: 07/02/2013 19:07    ASSESSMENT AND PLAN 79 yo with history of diastolic CHF, persistent atrial fibrillation, tachy-brady syndrome s/p St Jude PCM, and autonomic dysfunction presented initially with acute on chronic diastolic CHF exacerbation.  1. Orthostatic hypotension: Long-standing. Has been thought to have autonomic insufficiency. This is a very limiting symptom.  - He continues on midodrine and pyridostigmine. Would leave these medications at current doses.   - I would hold off on compression stockings for now given wound on left leg.  - I am not sure that we are going to make this particular problem much better in the long run.  2. Atrial fibrillation: V-paced. Rate is reasonable. He is not anticoagulated (high fall risk) and has refused anticoagulation anyway. Not on telemetry. 3. Acute on chronic diastolic CHF: Weight is down 8 lb in past 3 days. He appears to be a little on the dry side today. Potassium is high.Treatment per primary service. Will not resume lasix yet. When he goes home will resume lower dose lasix 20 mg daily. 4. Sacral decubitus: No debridement necessary per surgeons. 5. Palliative care.    Signed, Cassell Clement  MD

## 2013-07-10 NOTE — Progress Notes (Signed)
FMTS Attending Daily Note:  Renold Don MD  [phone] pager  Family Practice pager:  [phone] I have seen and examined this patient and have reviewed their chart. I have discussed this patient with the resident. I agree with the resident's findings, assessment and care plan.  Additionally: - Patient declines palliative care.  Greatly appreciate cardiology input.  Plan to DC home likely tomorrow.   Tobey Grim, MD 07/10/2013 6:24 PM

## 2013-07-10 NOTE — Progress Notes (Signed)
Family Medicine Teaching Service Daily Progress Note Intern Pager: [phone]  Patient name: Marco Gregory Medical record number: [id number] Date of birth: [birth year] Age: 79 y.o. Gender: male  Primary Care Provider: Elvina Sidle, MD Consultants: Cardiology, Surgery, Palliative Care Code Status: DNR  Pt Overview and Major Events to Date:   07/09/13 - wt 178 (183) down 6 lbs in 2 days, +orthostatic, Hold Lasix, no surgical debridement 07/10/13 - wt 176, Hold Lasix, K+ elevated 5.9   Assessment and Plan: Marco Gregory is a 79 y.o. male with Paroxysmal atrial fibrillation s/p Pacemaker, Diastolic CHF, and COPD presents with worsening SOB and frequent falls.   # Dyspnea - Suspected secondary to CHF exacerbation given elevated BNP (2389), initially rales on PE, and LE edema. Patient was given IV Lasix 40 mg in the ED. However, long history of dysautonomia and low BP.  - Hgb stable at 12.8 - Echo: mild LV hypertrophy. EF 55%. Mildly dilated RV with normal systolic function. mild aortic stenosis - Strict I/O's: 24hr OUP 1280cc; Wt 176 lb  (202 admit) - continue fluid restriction 1.5L - Cardiology consulted CHF team per family request, follow-up recommendations  Flomax, Florinef discontinued   Diuresis per HF team: Lasix held today (held x2 days)  Dry fluid status, at or below b/l dry weight. Plan to resume home Lasix PO 20mg  daily on discharge  # LE ulcer and Sacral Decubitus ulcer (Stage 3)  - Wound care consulted; May need to image sacrum for Osteomyelitis or broaden abx to cover Group A if spikes fever - Less concerning for infection vs osteo, will not proceed with MRI at this time - WBC 8.0 (5.7) stable - ABI ; Left ABI indicates mild calcification of the pedal vessels with normal Doppler waveforms - DC Bactrim (completed 7 day course) - Surgery consult (eval Sacral decub ulcer) - No surgical debridement recommended. Cont santyl, no abx regimen. - Trial off of Foley,  prior to discharge  # Falls - Have been worsening recently. Likely secondary to orthostasis and deconditioning/weakness.  - PT/OT: Rec 24 hr assistance / home health - SW and palliative consulted - Patient wishes to go home  # UTI - UA revealed moderate leukocytes. Patient endorsing some dysuria. Patient was given CTX in the ED.  - Cx: NG x 5 days - No dysuria: UTI covered by cellulitis abx   # Paroxysmal atrial fibrillation  - Dig level subtheraputic on admit: 0.5 - Currently in Afib w/o RVR - Continuing home Digoxin  # Dysautonomia and hypotension, suspect multifactorial etiology likely autonomic dysfunction and age - discontinued Florinef in the setting of diuresis as it increases sodium reabsorption (increasing volume)  - continue Midodrine 10mg  TID - continue +dizzy when stand, suspect   # Hypothyroidism  - Continuing home synthroid   # Myasthenia  - This is documented in the medical record but per patient and daughter, they are unaware of this condition.  - Will continue [place].   # Hyperkalemia - gradual increasing trend 4.6-->4.9-->4.8-->5.2-->5.9 today - prior b/l around 4.0 - no concerning medications that could cause hyperkalemia, possibly due to hemolysis from lab - given Kayexalate (15mg  x 1 dose) -  re-check BMET today 1400   FEN/GI: Saline Lock IV; Heart Healthy Diet (no longer NPO), fluid restriction 1.5L Prophylaxis: Heparin SQ   Disposition: Pending clinical improvement, surgery recommendations, CHF team evaluation and home health equipment, potential discharge today vs tomorrow - contacted CM, home health arrangements already made. Hospital bed arrived at patient's house.  No further pending issues prior to discharge with home health and family supervision  Subjective: Per nursing, yesterday patient did report a few episodes of dyspnea, however normal O2 sat (>95% on RA) and he was not in respiratory distress or having any increased work of breathing.  Placed on 2L O2 [place] for comfort. Today speaking with patient he feels better, and reports that he is ready to go home. He tolerated breakfast well. Will try to stand with assistance today to assess dizziness, as this is a chronic issue he feels it is unchanged from before. Foley in place, continues with good UOP.  Objective: Temp:  [97.4 F (36.3 C)-97.7 F (36.5 C)] 97.5 F (36.4 C) (12/01 2100) Pulse Rate:  [64-68] 68 (12/01 2100) Resp:  [18-20] 18 (12/01 2100) BP: (104-133)/(65-77) 104/65 mmHg (12/01 1436) SpO2:  [95 %] 95 % (12/01 2100)  Physical Exam: General: laying in bed, pleasant, appears comfortable, NAD Neck: no JVD Cardiovascular: Irregularly irregular rhythm. 1/6 systolic murmur noted.  Respiratory: CTAB, resolved bibasilar crackles on exam. Good air movement b/l. Normal work of breathing Extremities: No edema (resolved, prior +1 pitting edema). Purpura also noted on left lower leg which is much improved. Right leg wound improved from previous exams. Neuro: awake, alert, oriented  Laboratory:  Recent Labs Lab 07/07/13 0500 07/08/13 0615 07/09/13 0718  WBC 5.3 5.7 8.0  HGB 12.3* 11.8* 12.8*  HCT 36.8* 36.4* 39.0  PLT 255 256 266    Recent Labs Lab 07/03/13 0615  07/07/13 0500 07/08/13 0615 07/09/13 0718  NA 138  < > 135 130* 134*  K 4.1  < > 4.9 4.8 5.2*  CL 98  < > 99 93* 96  CO2 31  < > 33* 31 32  BUN 15  < > 24* 22 24*  CREATININE 1.07  < > 1.11 1.14 1.19  CALCIUM 8.7  < > 8.8 8.9 9.3  PROT 6.4  --   --   --   --   BILITOT 0.9  --   --   --   --   ALKPHOS 84  --   --   --   --   ALT 14  --   --   --   --   AST 22  --   --   --   --   GLUCOSE 84  < > 96 98 109*  < > = values in this interval not displayed. Imaging/Diagnostic Tests:  No recent imaging/diagnostic tests  Saralyn Pilar, DO 07/10/2013, 5:57 AM PGY-1, [facility] Family Medicine FPTS Intern pager: [phone], text pages welcome

## 2013-07-10 NOTE — Progress Notes (Signed)
Patient evaluated for community based chronic disease management services with [facility] Care Management Program as a benefit of patient's [employer]. Spoke with patient/daughter at bedside to explain [facility] Care Management services.  Both have consented to services.  Written consents obtained.  Daughter Monico Hoar is the primary contact [phone].  Patient has a telehealth scale, BSC, Hospital Bed, [facility], and LCSW from [facility] ordered.  He has not being consistently weighing at home due to dizziness.  May benefit from a chair scale.  He will have 24/7 personal care assistance at home to bridge his recovery.  Have requested an FL2 for the chart. The [facility] team will use the document for direct to SNF admission from home as needed.  Daughter has agreed to this plan.  Patient's spouse is currently at [facility] SNF for short term rehabilitation due to a fall.  [facility] will assess her needs as indicated upon her discharge to home.  Patient's daughter will receive a post discharge transition of care call and will be evaluated for monthly home visits for assessments and disease process education.  Left contact information and [facility] literature at bedside. Made Inpatient Case Manager aware that [facility] Care Management following. Of note, [facility] Care Management services does not replace or interfere with any services that are arranged by inpatient case management or social work.  For additional questions or referrals please contact Anibal Henderson BSN RN [facility] Liaison at [phone].

## 2013-07-10 NOTE — Progress Notes (Signed)
Pt's daughter requesting Tums for pt's stomach.  Notified resident, order placed, will carry out orders.

## 2013-07-10 NOTE — Progress Notes (Signed)
Patient ZO:XWRUEAV OBI SCRIMA      DOB: May 20, 1921      WUJ:[id number]   Palliative Medicine Team at [facility] Progress Note    Subjective: Patient states he is not doing well.  He relates that he continues to have low blood pressure and is very fatigued.  He continues to express that he wants to get home to be with his wife.    Filed Vitals:   07/10/13 1400  BP: 120/71  Pulse: 63  Temp: 97.6 F (36.4 C)  Resp: 18   Physical exam:   General: awake and alert but very fatigued PERRL, EOMI, mmm Chest decreased but clear, no rhonchi [id number], S1,S2 Abd: soft, odor from sacral wound Ext: warm, no edema or mottling Neuro: awake, alert oriented     Assessment and plan: 79 yr old white male with autonomic dysfunction, CHF, sacral decubitus.  Autonomic dysfunction is his limiting factor.  He is funtionally bed bound.  He would benefit from hospice at home but refuses services.   1.DNR  2.  Sacral decubitus:  Positive odor .  If debridment is not going to take place could ask would care to consider topical flagyl to help with odor.  3.  Autonomic dysfunction: difficult situation. Optimize hydration with balance for chf .  Consider teds when leg wound heals.   Total time 15 min  Adysson Revelle L. [name], MD MBA The Palliative Medicine Team at [facility] Phone: [phone] Pager: [phone]

## 2013-07-10 NOTE — Telephone Encounter (Signed)
Phone call from [facility] health. They want to verify patient sees Dr Milus Glazier as his PCP, advised yes, and have advised when patient was last seen. Oct 2013

## 2013-07-10 NOTE — Care Management Note (Addendum)
  Page 2 of 2   07/10/2013     2:51:08 PM   CARE MANAGEMENT NOTE 07/10/2013  Patient:  Marco Gregory   Account Number:  [id number]  Date Initiated:  07/06/2013  Documentation initiated by:  ROYAL,CHERYL  Subjective/Objective Assessment:   Order for [facility] and hospice choices, however this family has decided that they do not wish hospice services at this time, but would like [facility] services and DME as pt wishes to have HHPT and HHOT.     Action/Plan:   Met with pt and daughter, they wish to use [facility] and will place list of DME needs in chart for MD to order as requested.   Anticipated DC Date:  07/10/2013   Anticipated DC Plan:  HOME W HOME HEALTH SERVICES      DC Planning Services  CM consult      PAC Choice  DURABLE MEDICAL EQUIPMENT  HOME HEALTH   Choice offered to / List presented to:  C-1 Patient   DME arranged  HOSPITAL BED  AIR OVERLAY MATTRESS  WHEELCHAIR - MANUAL  BEDSIDE COMMODE      DME agency  [facility]     HH arranged  HH-1 RN  HH-2 PT  HH-3 OT  HH-4 NURSE'S AIDE      HH agency  [facility]   Status of service:  Completed, signed off Medicare Important Message given?   (If response is "NO", the following Medicare IM given date fields will be blank) Date Medicare IM given:   Date Additional Medicare IM given:    Discharge Disposition:  HOME W HOME HEALTH SERVICES  Per UR Regulation:    If discussed at Long Length of Stay Meetings, dates discussed:    Comments:  07/10/13 1300 Marco Gregory Marco Gregory, BSN, [place] [phone] Spoke to pt daughter concerning delivery of DME to pt home. Daughter informed me that hospital bed and overlay mattress were delivered yesterday.  BSC and WC will be delivered to pt room prior to d/c home.  07/09/13 1015 Marco Cohn, RN, [name] [phone] Spoke with [facility] concerning hospital bed delivery.  07/07/13 16:00 CM called [facility] to clarify orders for hospital bed and overlay and to notify of discharge.   No other CM needs were communicated.  Marco Gregory, BSN, [place] [phone].  07/06/2013 Met with pt and family, pt selected [facility] for hh needs and DME. DME needs include: Hospital Bed, Speciality mattress and wheelchair. Family provided with list of private agencies for ongoing aide services on a 24 hr basis, as is their plan. Family and pt have decided against Home Hospice services at this time and will contact pt PCP post d/c if they find that they need Home Hospice services, currently they wish to use Home health so that they can  receive as much HHPT and HHOT as possible. Marco Shock RN MPH, case manager, [phone]

## 2013-07-11 DIAGNOSIS — G4733 Obstructive sleep apnea (adult) (pediatric): Secondary | ICD-10-CM

## 2013-07-11 DIAGNOSIS — Z7901 Long term (current) use of anticoagulants: Secondary | ICD-10-CM

## 2013-07-11 LAB — BASIC METABOLIC PANEL
BUN: 30 mg/dL — ABNORMAL HIGH (ref 6–23)
CO2: 27 mEq/L (ref 19–32)
Calcium: 9.3 mg/dL (ref 8.4–10.5)
Chloride: 99 mEq/L (ref 96–112)
GFR calc non Af Amer: 50 mL/min — ABNORMAL LOW (ref >90)
Glucose, Bld: 128 mg/dL — ABNORMAL HIGH (ref 70–99)
Potassium: 5 mEq/L (ref 3.5–5.1)
Sodium: 134 mEq/L — ABNORMAL LOW (ref 135–145)

## 2013-07-11 LAB — CBC
HCT: 41.3 % (ref 39.0–52.0)
Hemoglobin: 13.6 g/dL (ref 13.0–17.0)
Platelets: 284 10*3/uL (ref 150–400)
RBC: 4.41 MIL/uL (ref 4.22–5.81)
WBC: 11.9 10*3/uL — ABNORMAL HIGH (ref 4.0–10.5)

## 2013-07-11 MED ORDER — ENSURE PUDDING PO PUDG: 1.0000 | Freq: Two times a day (BID) | ORAL | Status: DC

## 2013-07-11 MED ORDER — PRO-STAT SUGAR FREE PO LIQD: 30.0000 mL | Freq: Three times a day (TID) | ORAL | Status: DC

## 2013-07-11 MED ORDER — FUROSEMIDE 20 MG PO TABS: 20.0000 mg | ORAL_TABLET | ORAL | Status: DC

## 2013-07-11 MED ORDER — COLLAGENASE 250 UNIT/GM EX OINT: TOPICAL_OINTMENT | Freq: Every day | CUTANEOUS | Status: DC

## 2013-07-11 NOTE — Progress Notes (Signed)
FMTS Attending Daily Note:  Renold Don MD  [phone] pager  Family Practice pager:  [phone] I have seen and examined this patient and have reviewed their chart. I have discussed this patient with the resident. I agree with the resident's findings, assessment and care plan.  Additionally:  - Appreciate Cardiology input for this patient.  OK to DC home today.   Tobey Grim, MD 07/11/2013 4:16 PM

## 2013-07-11 NOTE — Progress Notes (Signed)
Family Medicine Teaching Service Daily Progress Note Intern Pager: [phone]  Patient name: Marco Gregory Medical record number: [id number] Date of birth: [birth year] Age: 79 y.o. Gender: male  Primary Care Provider: Elvina Sidle, MD Consultants: Cardiology, Surgery, Palliative Care Code Status: DNR  Pt Overview and Major Events to Date:  07/09/13 - wt 178 (183) down 6 lbs in 2 days, +orthostatic, Hold Lasix, no surgical debridement 07/10/13 - wt 176, Hold Lasix, K+ elevated 5.9    Assessment and Plan: Marco Gregory is a 79 y.o. male with Paroxysmal atrial fibrillation s/p Pacemaker, Diastolic CHF, and COPD presents with worsening SOB and frequent falls.   # Dyspnea - Suspected secondary to CHF exacerbation given elevated BNP (2389), initially rales on PE, and LE edema. Long history of dysautonomia and low BP.  - Hgb stable at 13.6 - Echo: mild LV hypertrophy. EF 55%. Mildly dilated RV with normal systolic function. mild aortic stenosis - Strict I/O's: 24hr OUP 400cc; Wt 166 lb  (202 admit) - continue fluid restriction 1.5L - Cardiology consulted CHF team per family request, follow-up recommendations  Flomax, Florinef discontinued   Diuresis per HF team: Lasix to be restarted QOD  Dry fluid status, at or below b/l dry weight. Plan to resume home Lasix PO 20mg  QOD on discharge  # LE ulcer and Sacral Decubitus ulcer (Stage 3)  - Wound care consulted; May need to image sacrum for Osteomyelitis or broaden abx to cover Group A if spikes fever - Less concerning for infection vs osteo, will not proceed with MRI at this time - WBC 8.0 (5.7) stable - ABI ; Left ABI indicates mild calcification of the pedal vessels with normal Doppler waveforms - s/p Bactrim x7 days  - Surgery consult (eval Sacral decub ulcer) - No surgical debridement recommended. Cont santyl, no abx regimen. - Trial off of Foley, voiding into condom catheter and requesting these for home given limited  mobility  # Falls - Have been worsening recently. Likely secondary to orthostasis and deconditioning/weakness.  - PT/OT: Rec 24 hr assistance / home health - SW and palliative consulted - Patient wishes to go home  # UTI - UA revealed moderate leukocytes. Patient endorsing some dysuria. Patient was given CTX in the ED.  - Cx: NG x 5 days - No dysuria: UTI covered by cellulitis abx   # Paroxysmal atrial fibrillation  - Dig level subtheraputic on admit: 0.5 - Currently in Afib w/o RVR - Continuing home Digoxin  # Dysautonomia and hypotension, suspect multifactorial etiology likely autonomic dysfunction and age - continue Midodrine and pyridostigmine - this a long standing issue for him  # Hypothyroidism  - Continuing home synthroid   # Myasthenia  - This is documented in the medical record but per patient and daughter, they are unaware of this condition.  - Will continue [place].   # Hyperkalemia- resolved  FEN/GI: Saline Lock IV; Heart Healthy Diet, fluid restriction 1.5L Prophylaxis: Heparin SQ   Disposition: discharge today, home health arrangements already made. Hospital bed arrived at patient's house. No further pending issues prior to discharge with home health and family supervision  Subjective: Today patient feels better, and reports that he is ready to go home. He tolerated breakfast well. No dizziness or lightheadeness  Objective: Temp:  [97.2 F (36.2 C)-97.6 F (36.4 C)] 97.2 F (36.2 C) (12/03 0523) Pulse Rate:  [63-68] 67 (12/03 0523) Resp:  [18-20] 18 (12/03 0523) BP: (105-120)/(63-71) 112/67 mmHg (12/03 0523) SpO2:  [96 %-99 %] 96 % (  12/03 0523) Weight:  [166 lb 3.6 oz (75.4 kg)] 166 lb 3.6 oz (75.4 kg) (12/03 0523)  Physical Exam: General: laying in bed, pleasant, appears comfortable, NAD Neck: no JVD Cardiovascular: Irregularly irregular rhythm. No murmur Appreciated Respiratory: CTAB, no crackles on exam. Good air movement b/l. Normal work of  breathing Extremities: No edema. Right leg wound improved from previous exams. Neuro: awake, alert, oriented  Laboratory:  Recent Labs Lab 07/08/13 0615 07/09/13 0718 07/10/13 0555  WBC 5.7 8.0 10.9*  HGB 11.8* 12.8* 14.1  HCT 36.4* 39.0 42.7  PLT 256 266 275    Recent Labs Lab 07/09/13 0718 07/10/13 0555 07/10/13 1448  NA 134* 130* 130*  K 5.2* 5.9* 5.7*  CL 96 95* 95*  CO2 32 30 25  BUN 24* 26* 31*  CREATININE 1.19 1.27 1.19  CALCIUM 9.3 9.3 9.6  GLUCOSE 109* 117* 136*   Imaging/Diagnostic Tests:  No recent imaging/diagnostic tests  Beverely Low, MD 07/11/2013, 6:54 AM PGY-1, [facility] Family Medicine FPTS Intern pager: [phone], text pages welcome

## 2013-07-11 NOTE — Progress Notes (Signed)
Occupational Therapy Treatment Patient Details Name: Marco Gregory MRN: [id number] DOB: [birth year] Today's Date: 07/11/2013 Time: 6962-9528 OT Time Calculation (min): 17 min  OT Assessment / Plan / Recommendation  History of present illness Marco Gregory is a 79 y.o. male with Paroxysmal atrial fibrillation s/p Pacemaker, Diastolic CHF, and COPD presents with worsening SOB and frequent falls.   OT comments  Pt sat EOB x 5 minutes before c/o sacral would area pain and stated that he needed to lay back down. Pt completed UB dressing in supine. OT ad nurse tech assisted pt with pericare.hygiebe after he urinated. Pt scheduled to d/c today  Follow Up Recommendations  SNF;Supervision/Assistance - 24 hour;Home health OT    Barriers to Discharge   none    Equipment Recommendations    none   Recommendations for Other Services    Frequency Min 2X/week   Progress towards OT Goals Progress towards OT goals: OT to reassess next treatment  Plan Discharge plan remains appropriate    Precautions / Restrictions Precautions Precautions: Fall   Pertinent Vitals/Pain 7/10 sacral wound pain after sitting EOB x 5 minutes    ADL  Grooming: Performed;Wash/dry hands;Wash/dry face;Set up Where Assessed - Grooming: Unsupported sitting Upper Body Dressing: Performed;Min guard Where Assessed - Upper Body Dressing: Supine, head of bed up;Unsupported sitting Transfers/Ambulation Related to ADLs: pt declined transfer to [facility] whensitting EOB due to pain from sacra wound and wanted to return to supine ADL Comments: pt sat EOB for UB dressing and completed buttoning shirt in supine. pericare/hygiene with  max A while in supine with nurse tech, pt urinating (condom cath will not stay on). unable to address LB dressing    OT Diagnosis:    OT Problem List:   OT Treatment Interventions:     OT Goals(current goals can now be found in the care plan section)    Visit Information  Last OT Received  On: 07/11/13 Assistance Needed: +1 History of Present Illness: Marco Gregory is a 79 y.o. male with Paroxysmal atrial fibrillation s/p Pacemaker, Diastolic CHF, and COPD presents with worsening SOB and frequent falls.    Subjective Data      Prior Functioning       Cognition  Cognition Arousal/Alertness: Awake/alert Behavior During Therapy: WFL for tasks assessed/performed Overall Cognitive Status: Within Functional Limits for tasks assessed    Mobility  Bed Mobility Bed Mobility: Supine to Sit;Sitting - Scoot to [place] of Bed;Sit to Supine;Scooting to [facility] Supine to Sit: 6: Modified independent (Device/Increase time);HOB elevated;With rails Sitting - Scoot to Edge of Bed: 5: Supervision Sit to Supine: 4: Min guard Scooting to HOB: 4: Min assist;With rail Details for Bed Mobility Assistance: pt sat EOB x 5 minutes for UB dressing and then stated he had to lay back down due to pain form sacral wound Transfers Transfers: Not assessed Details for Transfer Assistance: pt sat EOB x 5 miuntes for UB dressing and then stated he had to lay back down due to pain form sacral wound    Exercises      Balance Balance Balance Assessed: Yes Static Sitting Balance Static Sitting - Level of Assistance: 6: Modified independent (Device/Increase time);5: Stand by assistance Dynamic Sitting Balance Dynamic Sitting - Balance Support: No upper extremity supported;Feet unsupported;During functional activity Dynamic Sitting - Level of Assistance: Other (comment) (min guard A)   End of Session OT - End of Session Activity Tolerance: Patient limited by pain Patient left: in bed;with nursing/sitter in room  GO  Marco Gregory 07/11/2013, 2:10 PM

## 2013-07-11 NOTE — Progress Notes (Signed)
Physical Therapy Treatment Patient Details Name: Marco Gregory MRN: [id number] DOB: [birth year] Today's Date: 07/11/2013 Time: 1252-1302 PT Time Calculation (min): 10 min  PT Assessment / Plan / Recommendation  History of Present Illness Marco Gregory is a 79 y.o. male with Paroxysmal atrial fibrillation s/p Pacemaker, Diastolic CHF, and COPD presents with worsening SOB and frequent falls.   PT Comments   Pt transfers well to EOB.  He became very dizzy and was unable to even attempt standing.  Pt returned supine and BP taken ~ 2 mins later and was 94/58.  Discussed with pt and daughter d/c plans.  Pt is planning on going home today via w/c and will have 24 hour aides.  Discussed whether or not pt will tolerate sitting up in w/c on drive home due to dizziness.  Per daughter, ambulance is cost-prohibitive.  Spoke with nurse re: supine BP and pt's performance.  Pt's primary limitation is BP and dizziness.  He moved well with supine <> sit and with scooting up in bed.  Follow Up Recommendations  Supervision/Assistance - 24 hour;Home health PT     Does the patient have the potential to tolerate intense rehabilitation     Barriers to Discharge        Equipment Recommendations  None recommended by PT    Recommendations for Other Services    Frequency     Progress towards PT Goals Progress towards PT goals: Not progressing toward goals - comment (limited by dizziness)  Plan Current plan remains appropriate    Precautions / Restrictions Precautions Precautions: Fall   Pertinent Vitals/Pain No c/o pain    Mobility  Bed Mobility Bed Mobility: Scooting to HOB Supine to Sit: 6: Modified independent (Device/Increase time);HOB elevated;With rails Sitting - Scoot to Edge of Bed: 5: Supervision Sit to Supine: 4: Min guard Details for Bed Mobility Assistance:  (Pt with improved mobility with use of rails. Dizzy at [facility]) Transfers Transfers: Not assessed    Exercises     PT  Diagnosis:    PT Problem List:   PT Treatment Interventions:     PT Goals (current goals can now be found in the care plan section) Acute Rehab PT Goals Time For Goal Achievement: 07/18/13  Visit Information  Last PT Received On: 07/11/13 Assistance Needed: +1 History of Present Illness: Marco Gregory is a 79 y.o. male with Paroxysmal atrial fibrillation s/p Pacemaker, Diastolic CHF, and COPD presents with worsening SOB and frequent falls.    Subjective Data  Subjective: "I'm dizzy.  I have to lay down."   Cognition  Cognition Arousal/Alertness: Awake/alert Behavior During Therapy: WFL for tasks assessed/performed Overall Cognitive Status: Within Functional Limits for tasks assessed    Balance  Balance Balance Assessed: Yes Static Sitting Balance Static Sitting - Level of Assistance: 6: Modified independent (Device/Increase time);5: Stand by assistance  End of Session PT - End of Session Activity Tolerance: Treatment limited secondary to medical complications (Comment) (Limited by dizziness) Patient left: in bed;with family/visitor present Nurse Communication: Mobility status;Other (comment) (BP and dizziness)   GP     Marco Gregory 07/11/2013, 1:15 PM

## 2013-07-11 NOTE — Progress Notes (Signed)
Patient Name: Marco Gregory Date of Encounter: 07/11/2013     Principal Problem:   Acute exacerbation of CHF (congestive heart failure) Active Problems:   DYSAUTONOMIA   Orthostasis   Myasthenia gravis   PAF (paroxysmal atrial fibrillation)   Skin ulcer   Decubitus ulcer of sacral region, stage 3   Fall at home    SUBJECTIVE No chest pain or increased dyspnea today.  His weight is down further since yesterday.  Recorded weight is 166.  He is not eating or drinking much.  He is off diuretics.  CURRENT MEDS . calcium carbonate  1 tablet Oral BID WC  . collagenase   Topical Daily  . digoxin  125 mcg Oral Daily  . docusate sodium  100 mg Oral BID  . dorzolamide-timolol  1 drop Both Eyes Daily  . feeding supplement (ENSURE)  1 Container Oral BID BM  . feeding supplement (PRO-STAT SUGAR FREE 64)  30 mL Oral TID WC  . finasteride  5 mg Oral Daily  . heparin  5,000 Units Subcutaneous Q8H  . levothyroxine  150 mcg Oral QAC breakfast  . midodrine  10 mg Oral TID WC  . pyridostigmine  60 mg Oral TID WC  . sodium chloride  3 mL Intravenous Q12H    OBJECTIVE  Filed Vitals:   07/10/13 0640 07/10/13 1400 07/10/13 2048 07/11/13 0523  BP: 107/68 120/71 105/63 112/67  Pulse: 73 63 68 67  Temp: 97.5 F (36.4 C) 97.6 F (36.4 C) 97.4 F (36.3 C) 97.2 F (36.2 C)  TempSrc: Oral Oral Oral Oral  Resp: 18 18 20 18   Height:      Weight: 176 lb 12.9 oz (80.2 kg)   166 lb 3.6 oz (75.4 kg)  SpO2: 94% 96% 99% 96%    Intake/Output Summary (Last 24 hours) at 07/11/13 0831 Last data filed at 07/11/13 0700  Gross per 24 hour  Intake    240 ml  Output    525 ml  Net   -285 ml   Filed Weights   07/09/13 0454 07/10/13 0640 07/11/13 0523  Weight: 178 lb 2.1 oz (80.8 kg) 176 lb 12.9 oz (80.2 kg) 166 lb 3.6 oz (75.4 kg)    PHYSICAL EXAM  General: Pleasant, NAD. Neuro: Alert and oriented X 3. Moves all extremities spontaneously. Psych: Normal affect. HEENT:  Normal  Neck:  Supple without bruits or JVD. Lungs:  Resp regular and unlabored, CTA. Heart: RRR no s3, s4, or murmurs. Abdomen: Soft, non-tender, non-distended, BS + x 4.  Extremities: No clubbing, cyanosis or edema. DP/PT/Radials 2+ and equal bilaterally.  Accessory Clinical Findings  CBC  Recent Labs  07/10/13 0555 07/11/13 0603  WBC 10.9* 11.9*  HGB 14.1 13.6  HCT 42.7 41.3  MCV 94.1 93.7  PLT 275 284   Basic Metabolic Panel  Recent Labs  07/10/13 1448 07/11/13 0603  NA 130* 134*  K 5.7* 5.0  CL 95* 99  CO2 25 27  GLUCOSE 136* 128*  BUN 31* 30*  CREATININE 1.19 1.23  CALCIUM 9.6 9.3   Liver Function Tests No results found for this basename: AST, ALT, ALKPHOS, BILITOT, PROT, ALBUMIN,  in the last 72 hours No results found for this basename: LIPASE, AMYLASE,  in the last 72 hours Cardiac Enzymes No results found for this basename: CKTOTAL, CKMB, CKMBINDEX, TROPONINI,  in the last 72 hours BNP No components found with this basename: POCBNP,  D-Dimer No results found for this basename: DDIMER,  in the last 72 hours Hemoglobin A1C No results found for this basename: HGBA1C,  in the last 72 hours Fasting Lipid Panel No results found for this basename: CHOL, HDL, LDLCALC, TRIG, CHOLHDL, LDLDIRECT,  in the last 72 hours Thyroid Function Tests No results found for this basename: TSH, T4TOTAL, FREET3, T3FREE, THYROIDAB,  in the last 72 hours  TELE  Not on telemetry    Radiology/Studies  Dg Chest 2 View  07/02/2013   CLINICAL DATA:  Leg swelling, history of CHF  EXAM: CHEST  2 VIEW  COMPARISON:  Prior chest x-ray 06/07/2012  FINDINGS: Stable cardiomegaly. Atherosclerotic calcifications noted in the transverse aorta. Left subclavian approach cardiac rhythm maintenance device with leads projecting over the right atrium and right ventricle. Mild pulmonary vascular congestion, slightly progressed from prior but without overt edema. Elevation of left hemidiaphragm with mild lingular  atelectasis. No pneumothorax or large pleural effusion. No acute osseous abnormality. Central bronchitic changes are similar compared to prior.  IMPRESSION: Mild pulmonary vascular congestion without overt edema.  Stable cardiomegaly.   Electronically Signed   By: Malachy Moan M.D.   On: 07/02/2013 17:22   Ct Head Wo Contrast  07/02/2013   CLINICAL DATA:  Left-sided neck pain, recent falls  EXAM: CT HEAD WITHOUT CONTRAST  CT CERVICAL SPINE WITHOUT CONTRAST  TECHNIQUE: Multidetector CT imaging of the head and cervical spine was performed following the standard protocol without intravenous contrast. Multiplanar CT image reconstructions of the cervical spine were also generated.  COMPARISON:  04/19/2012  FINDINGS: CT HEAD FINDINGS  Mild soft tissue changes are noted within the left maxillary antrum likely of a chronic nature. The bony calvarium is intact. Atrophic changes are identified as well as chronic white matter ischemic change. No findings to suggest acute hemorrhage, acute infarction or space-occupying mass lesion are noted.  CT CERVICAL SPINE FINDINGS  Seven cervical segments are well visualized. Vertebral body height is well maintained. Multilevel facet hypertrophic changes are noted. No acute fracture or acute facet abnormality is noted. The surrounding soft tissues are within normal limits with the exception of carotid calcifications.  IMPRESSION: CT head:  Chronic changes without acute abnormality.  CT of the cervical spine: Degenerative change without acute abnormality.   Electronically Signed   By: Alcide Clever M.D.   On: 07/02/2013 19:07   Ct Cervical Spine Wo Contrast  07/02/2013   CLINICAL DATA:  Left-sided neck pain, recent falls  EXAM: CT HEAD WITHOUT CONTRAST  CT CERVICAL SPINE WITHOUT CONTRAST  TECHNIQUE: Multidetector CT imaging of the head and cervical spine was performed following the standard protocol without intravenous contrast. Multiplanar CT image reconstructions of the  cervical spine were also generated.  COMPARISON:  04/19/2012  FINDINGS: CT HEAD FINDINGS  Mild soft tissue changes are noted within the left maxillary antrum likely of a chronic nature. The bony calvarium is intact. Atrophic changes are identified as well as chronic white matter ischemic change. No findings to suggest acute hemorrhage, acute infarction or space-occupying mass lesion are noted.  CT CERVICAL SPINE FINDINGS  Seven cervical segments are well visualized. Vertebral body height is well maintained. Multilevel facet hypertrophic changes are noted. No acute fracture or acute facet abnormality is noted. The surrounding soft tissues are within normal limits with the exception of carotid calcifications.  IMPRESSION: CT head:  Chronic changes without acute abnormality.  CT of the cervical spine: Degenerative change without acute abnormality.   Electronically Signed   By: Alcide Clever M.D.   On:  07/02/2013 19:07    ASSESSMENT AND PLAN 79 yo with history of diastolic CHF, persistent atrial fibrillation, tachy-brady syndrome s/p St Jude PCM, and autonomic dysfunction presented initially with acute on chronic diastolic CHF exacerbation.  1. Orthostatic hypotension: Long-standing. Has been thought to have autonomic insufficiency. This is a very limiting symptom.  This has improved since diuretics have been held. - He continues on midodrine and pyridostigmine. Would leave these medications at current doses.  - I would hold off on compression stockings for now given wound on left leg.   2. Atrial fibrillation: V-paced. Rate is reasonable. He is not anticoagulated (high fall risk) and has refused anticoagulation anyway. Not on telemetry.  3. Acute on chronic diastolic CHF: Weight continues to fall.  He appears to be a little on the dry side today. Potassium is improved since oral Kayexalate yesterday. Will not resume lasix yet. When he goes home will resume lower dose lasix 20 mg just every other day to start  with. 4. Sacral decubitus: No debridement necessary per surgeons.   Anticipate home soon with home health nurses  Signed, Cassell Clement MD

## 2013-07-11 NOTE — Progress Notes (Addendum)
Spoke with daughter [name]) at bedside 07/10/13 regarding condom catheters for pt.  Advised daughter to purchase from medical supply store.  Will follow-up with daughter prior to pt d/c home today.

## 2013-07-12 DIAGNOSIS — N390 Urinary tract infection, site not specified: Secondary | ICD-10-CM

## 2013-07-12 MED ORDER — TRAMADOL HCL 50 MG PO TABS: 50.0000 mg | ORAL_TABLET | Freq: Two times a day (BID) | ORAL | Status: DC | PRN

## 2013-07-12 NOTE — Progress Notes (Signed)
FMTS Attending Daily Note:  Jeff Walden MD  [phone] pager  Family Practice pager:  [phone] I have discussed this patient with the resident Dr. Karamalegos.  I agree with their findings, assessment, and care plan  

## 2013-07-12 NOTE — Plan of Care (Signed)
Problem: Discharge Progression Outcomes Goal: Able to perform self care activities Outcome: Not Met (add Reason) Patient total care

## 2013-07-12 NOTE — Progress Notes (Signed)
Family Medicine Teaching Service Daily Progress Note Intern Pager: [phone]  Patient name: Marco Gregory Medical record number: [id number] Date of birth: 1920-12-16 Age: 79 y.o. Gender: male  Primary Care Provider: Elvina Sidle, MD Consultants: Cardiology, Surgery, Palliative Care Code Status: DNR  Pt Overview and Major Events to Date:  07/09/13 - wt 178 (183) down 6 lbs in 2 days, +orthostatic, Hold Lasix, no surgical debridement 07/10/13 - wt 176, Hold Lasix, K+ elevated 5.9 07/11/13 - wt 166, Hold Lasix, K+ improved 5.0. Family decided not comfortable caring for at home. Request SNF 07/12/13 - wt 175, Hold Lasix, pending SNF placement today  Assessment and Plan: Marco Gregory is a 79 y.o. male with Paroxysmal atrial fibrillation s/p Pacemaker, Diastolic CHF, and COPD presents with worsening SOB and frequent falls.   # Dyspnea - improved Suspected secondary to CHF exacerbation given elevated BNP (2389), initially rales on PE, and LE edema. Long history of dysautonomia and low BP.  - Hgb stable at 13.6 - Echo: mild LV hypertrophy. EF 55%. Mildly dilated RV with normal systolic function. mild aortic stenosis - Strict I/O's: 24hr OUP 400cc; Wt 166 lb  (202 admit) - continue fluid restriction 1.5L - Cardiology consulted CHF team per family request, follow-up recommendations  Flomax, Florinef discontinued   Diuresis per HF team: continue to hold Lasix (x 3 days)  Dry fluid status, at or below b/l dry weight. Plan to resume home Lasix PO 20mg  QOD on discharge  # LE ulcer and Sacral Decubitus ulcer (Stage 3) Stable, followed by wound care, less concerning for infection at this time, WBC stable. No plans for imaging. Completed abx course with Bactrim x 7 days. - ABI ; Left ABI indicates mild calcification of the pedal vessels with normal Doppler waveforms - Surgery consult (eval Sacral decub ulcer) - No surgical debridement recommended. Cont santyl, no further abx - Off of  Foley, voiding into condom catheter and requesting these for home given limited mobility  # Falls Recent h/o worsening. Likely secondary to orthostasis and deconditioning/weakness.  - PT/OT: Rec SNF / 24 hr assistance / home health - SW and palliative consulted - Patient and family declined palliative care at this time. - agree to SNF placement today at [facility], pending CSW clearance  # Paroxysmal atrial fibrillation  - Dig level subtheraputic on admit: 0.5 - Currently in Afib w/o RVR - Continuing home Digoxin  # Dysautonomia and hypotension, suspect multifactorial etiology likely autonomic dysfunction and age - continue Midodrine and pyridostigmine - this a long standing issue for him  # Hypothyroidism  - Continuing home synthroid   # Myasthenia  - This is documented in the medical record but per patient and daughter, they are unaware of this condition.  - Will continue [place].   # Hyperkalemia- resolved  # Possible UTI - Resolved Initial UA suggestive of infection w/ mod leuks, some dysuria on presentation. Patient was given CTX in the ED.  - Urine Cx: negative  FEN/GI: Saline Lock IV; Heart Healthy Diet, fluid restriction 1.5L Prophylaxis: Heparin SQ   Disposition: Currently accepted and ready for transfer to SNF [facility]) today. Previously medically ready for discharge, now pending transfer to facility when availability. Expect to be transferred to SNF today.  Subjective: Today patient feels about the same as yesterday. Admits to feeling uncomfortable sitting on his sacral ulcer, and can't find a comfortable position. Notes that yesterday he was unable to stand and walk, he felt dizzy while sitting on side of bed. Continues  to work with PT/OT. Agreeable to SNF transfer to [facility]. No dyspnea or edema.  Objective: Temp:  [97.2 F (36.2 C)-98 F (36.7 C)] 97.2 F (36.2 C) (12/03 2043) Pulse Rate:  [62-70] 62 (12/03 2043) Resp:  [18] 18 (12/03 2043) BP:  (94-112)/(58-67) 98/59 mmHg (12/03 2043) SpO2:  [96 %-97 %] 97 % (12/03 2043) Weight:  [166 lb 3.6 oz (75.4 kg)] 166 lb 3.6 oz (75.4 kg) (12/03 0523)  Physical Exam: General: laying in bed, pleasant, appears comfortable, NAD Neck: no JVD Cardiovascular: Irregularly irregular rhythm. No murmur Appreciated Respiratory: CTAB, no crackles on exam. Good air movement b/l. Normal work of breathing Extremities: No edema. Right leg wound improved from previous exams. Neuro: awake, alert, oriented  Laboratory:  Recent Labs Lab 07/09/13 0718 07/10/13 0555 07/11/13 0603  WBC 8.0 10.9* 11.9*  HGB 12.8* 14.1 13.6  HCT 39.0 42.7 41.3  PLT 266 275 284    Recent Labs Lab 07/10/13 0555 07/10/13 1448 07/11/13 0603  NA 130* 130* 134*  K 5.9* 5.7* 5.0  CL 95* 95* 99  CO2 30 25 27   BUN 26* 31* 30*  CREATININE 1.27 1.19 1.23  CALCIUM 9.3 9.6 9.3  GLUCOSE 117* 136* 128*   Imaging/Diagnostic Tests:  No recent imaging/diagnostic tests  Saralyn Pilar, DO 07/12/2013, 3:54 AM PGY-1, [facility] FPTS Intern pager: [phone], text pages welcome

## 2013-07-12 NOTE — Discharge Summary (Signed)
Family Medicine Teaching Service  Discharge Note : Attending Jeff Walden MD Pager [phone] Inpatient Team Pager:  [phone]  I have reviewed this patient and the patient's chart and have discussed discharge planning with the resident at the time of discharge. I agree with the discharge plan as above.    

## 2013-07-12 NOTE — Progress Notes (Signed)
Pt alert and oriented times 4, suppository given per request, pt complained of constipation, tolerated well, daughter at bedside, ambulance transported patient to [facility], by stretcher, NAD noted, able to communicate needs, report called to [employer]

## 2013-07-12 NOTE — Progress Notes (Signed)
Patient Name: Marco Gregory Date of Encounter: 07/12/2013     Principal Problem:   Acute exacerbation of CHF (congestive heart failure) Active Problems:   DYSAUTONOMIA   Orthostasis   Myasthenia gravis   PAF (paroxysmal atrial fibrillation)   Skin ulcer   Decubitus ulcer of sacral region, stage 3   Fall at home    SUBJECTIVE No chest pain or increased dyspnea today.  Dramatic swings in daily weights. Clinically still euvolemic to slightly dry. Still very dizzy when vertical.   CURRENT MEDS . calcium carbonate  1 tablet Oral BID WC  . collagenase   Topical Daily  . digoxin  125 mcg Oral Daily  . docusate sodium  100 mg Oral BID  . dorzolamide-timolol  1 drop Both Eyes Daily  . feeding supplement (ENSURE)  1 Container Oral BID BM  . feeding supplement (PRO-STAT SUGAR FREE 64)  30 mL Oral TID WC  . finasteride  5 mg Oral Daily  . heparin  5,000 Units Subcutaneous Q8H  . levothyroxine  150 mcg Oral QAC breakfast  . midodrine  10 mg Oral TID WC  . pyridostigmine  60 mg Oral TID WC  . sodium chloride  3 mL Intravenous Q12H    OBJECTIVE  Filed Vitals:   07/11/13 1300 07/11/13 1426 07/11/13 2043 07/12/13 0604  BP: 94/58 96/67 98/59  106/67  Pulse:  70 62 84  Temp:  98 F (36.7 C) 97.2 F (36.2 C) 97.6 F (36.4 C)  TempSrc:  Oral Oral Oral  Resp:   18 18  Height:      Weight:    175 lb (79.379 kg)  SpO2:  97% 97% 90%    Intake/Output Summary (Last 24 hours) at 07/12/13 0848 Last data filed at 07/12/13 0818  Gross per 24 hour  Intake    940 ml  Output    325 ml  Net    615 ml   Filed Weights   07/10/13 0640 07/11/13 0523 07/12/13 0604  Weight: 176 lb 12.9 oz (80.2 kg) 166 lb 3.6 oz (75.4 kg) 175 lb (79.379 kg)    PHYSICAL EXAM  General: Pleasant, NAD. Neuro: Alert and oriented X 3. Moves all extremities spontaneously. Psych: Normal affect. HEENT:  Normal  Neck: Supple without bruits or JVD. Lungs:  Resp regular and unlabored, Few rales at left  base. Heart: RRR no s3, s4, There is soft systolic murmur at base. Abdomen: Soft, non-tender, non-distended, BS + x 4.  Extremities: No clubbing, cyanosis or edema. DP/PT/Radials 2+ and equal bilaterally.  Accessory Clinical Findings  CBC  Recent Labs  07/10/13 0555 07/11/13 0603  WBC 10.9* 11.9*  HGB 14.1 13.6  HCT 42.7 41.3  MCV 94.1 93.7  PLT 275 284   Basic Metabolic Panel  Recent Labs  07/10/13 1448 07/11/13 0603  NA 130* 134*  K 5.7* 5.0  CL 95* 99  CO2 25 27  GLUCOSE 136* 128*  BUN 31* 30*  CREATININE 1.19 1.23  CALCIUM 9.6 9.3   Liver Function Tests No results found for this basename: AST, ALT, ALKPHOS, BILITOT, PROT, ALBUMIN,  in the last 72 hours No results found for this basename: LIPASE, AMYLASE,  in the last 72 hours Cardiac Enzymes No results found for this basename: CKTOTAL, CKMB, CKMBINDEX, TROPONINI,  in the last 72 hours BNP No components found with this basename: POCBNP,  D-Dimer No results found for this basename: DDIMER,  in the last 72 hours Hemoglobin A1C No results found  for this basename: HGBA1C,  in the last 72 hours Fasting Lipid Panel No results found for this basename: CHOL, HDL, LDLCALC, TRIG, CHOLHDL, LDLDIRECT,  in the last 72 hours Thyroid Function Tests No results found for this basename: TSH, T4TOTAL, FREET3, T3FREE, THYROIDAB,  in the last 72 hours  TELE  Not on telemetry    Radiology/Studies  Dg Chest 2 View  07/02/2013   CLINICAL DATA:  Leg swelling, history of CHF  EXAM: CHEST  2 VIEW  COMPARISON:  Prior chest x-ray 06/07/2012  FINDINGS: Stable cardiomegaly. Atherosclerotic calcifications noted in the transverse aorta. Left subclavian approach cardiac rhythm maintenance device with leads projecting over the right atrium and right ventricle. Mild pulmonary vascular congestion, slightly progressed from prior but without overt edema. Elevation of left hemidiaphragm with mild lingular atelectasis. No pneumothorax or large  pleural effusion. No acute osseous abnormality. Central bronchitic changes are similar compared to prior.  IMPRESSION: Mild pulmonary vascular congestion without overt edema.  Stable cardiomegaly.   Electronically Signed   By: Malachy Moan M.D.   On: 07/02/2013 17:22   Ct Head Wo Contrast  07/02/2013   CLINICAL DATA:  Left-sided neck pain, recent falls  EXAM: CT HEAD WITHOUT CONTRAST  CT CERVICAL SPINE WITHOUT CONTRAST  TECHNIQUE: Multidetector CT imaging of the head and cervical spine was performed following the standard protocol without intravenous contrast. Multiplanar CT image reconstructions of the cervical spine were also generated.  COMPARISON:  04/19/2012  FINDINGS: CT HEAD FINDINGS  Mild soft tissue changes are noted within the left maxillary antrum likely of a chronic nature. The bony calvarium is intact. Atrophic changes are identified as well as chronic white matter ischemic change. No findings to suggest acute hemorrhage, acute infarction or space-occupying mass lesion are noted.  CT CERVICAL SPINE FINDINGS  Seven cervical segments are well visualized. Vertebral body height is well maintained. Multilevel facet hypertrophic changes are noted. No acute fracture or acute facet abnormality is noted. The surrounding soft tissues are within normal limits with the exception of carotid calcifications.  IMPRESSION: CT head:  Chronic changes without acute abnormality.  CT of the cervical spine: Degenerative change without acute abnormality.   Electronically Signed   By: Alcide Clever M.D.   On: 07/02/2013 19:07   Ct Cervical Spine Wo Contrast  07/02/2013   CLINICAL DATA:  Left-sided neck pain, recent falls  EXAM: CT HEAD WITHOUT CONTRAST  CT CERVICAL SPINE WITHOUT CONTRAST  TECHNIQUE: Multidetector CT imaging of the head and cervical spine was performed following the standard protocol without intravenous contrast. Multiplanar CT image reconstructions of the cervical spine were also generated.   COMPARISON:  04/19/2012  FINDINGS: CT HEAD FINDINGS  Mild soft tissue changes are noted within the left maxillary antrum likely of a chronic nature. The bony calvarium is intact. Atrophic changes are identified as well as chronic white matter ischemic change. No findings to suggest acute hemorrhage, acute infarction or space-occupying mass lesion are noted.  CT CERVICAL SPINE FINDINGS  Seven cervical segments are well visualized. Vertebral body height is well maintained. Multilevel facet hypertrophic changes are noted. No acute fracture or acute facet abnormality is noted. The surrounding soft tissues are within normal limits with the exception of carotid calcifications.  IMPRESSION: CT head:  Chronic changes without acute abnormality.  CT of the cervical spine: Degenerative change without acute abnormality.   Electronically Signed   By: Alcide Clever M.D.   On: 07/02/2013 19:07    ASSESSMENT AND PLAN 79 yo  with history of diastolic CHF, persistent atrial fibrillation, tachy-brady syndrome s/p St Jude PCM, and autonomic dysfunction presented initially with acute on chronic diastolic CHF exacerbation.  1. Orthostatic hypotension: Long-standing. Has been thought to have autonomic insufficiency. This is a very limiting symptom.  This has improved since diuretics have been held. - He continues on midodrine and pyridostigmine. Would leave these medications at current doses.  - I would hold off on compression stockings for now given wound on left leg.   2. Atrial fibrillation: V-paced. Rate is reasonable. He is not anticoagulated (high fall risk) and has refused anticoagulation anyway. Not on telemetry.  3. Acute on chronic diastolic CHF: Weight continues to fall.  He appears to be a little on the dry side today. Potassium is improved since oral Kayexalate yesterday. Will not resume lasix yet. When he goes home will resume lower dose lasix 20 mg just every other day to start with. 4. Sacral decubitus: No  debridement necessary per surgeons.   Anticipate discharge today.  Signed, Cassell Clement MD

## 2013-07-13 ENCOUNTER — Other Ambulatory Visit: Payer: Self-pay | Admitting: *Deleted

## 2013-07-13 ENCOUNTER — Non-Acute Institutional Stay (SKILLED_NURSING_FACILITY): Payer: Medicare Other | Admitting: Nurse Practitioner

## 2013-07-13 DIAGNOSIS — I5032 Chronic diastolic (congestive) heart failure: Secondary | ICD-10-CM

## 2013-07-13 DIAGNOSIS — I509 Heart failure, unspecified: Secondary | ICD-10-CM

## 2013-07-13 DIAGNOSIS — L89109 Pressure ulcer of unspecified part of back, unspecified stage: Secondary | ICD-10-CM

## 2013-07-13 DIAGNOSIS — I951 Orthostatic hypotension: Secondary | ICD-10-CM

## 2013-07-13 DIAGNOSIS — L89153 Pressure ulcer of sacral region, stage 3: Secondary | ICD-10-CM

## 2013-07-13 DIAGNOSIS — L8993 Pressure ulcer of unspecified site, stage 3: Secondary | ICD-10-CM

## 2013-07-13 DIAGNOSIS — R5381 Other malaise: Secondary | ICD-10-CM

## 2013-07-13 DIAGNOSIS — I4891 Unspecified atrial fibrillation: Secondary | ICD-10-CM

## 2013-07-13 MED ORDER — TRAMADOL HCL 50 MG PO TABS: 50.0000 mg | ORAL_TABLET | Freq: Two times a day (BID) | ORAL | Status: DC | PRN

## 2013-07-13 NOTE — Progress Notes (Signed)
Patient ID: Marco Gregory, male   DOB: Oct 19, 1920, 79 y.o.   MRN: [id number]    Nursing Home Location:  [facility]   Place of Service: SNF (31)  PCP: Elvina Sidle, MD  No Known Allergies  Chief Complaint  Patient presents with  . Hospitalization Follow-up    HPI:  Marco Gregory is a 79 y.o. male with Paroxysmal atrial fibrillation s/p Pacemaker, Diastolic CHF, and COPD who went to the hospital with worsening SOB and frequent falls. On admission he was found to have left lower leg wound with cellulitis and sacral decubitus ulcer. on admission with 2+ LE edema, BNP (2389), wt 202 lbs, home regimen Lasix PO 20mg  daily. Florinef, Flomax was discontinued. His cardiologist (Dr. Patty Sermons) was consulted for HF management of diuresis, initially with Lasix IV 40mg  daily, eventually held diuretics x multiple days due to hypotension and dry fluid status. Overall, he was down 36 lbs on discharge  LE edema and Left leg ulcer/cellulitis - was Most likely due to HF and vascular insufficiency. Wound care was consulted and completed 7 day course of Bactrim used to treat cellulitis. ABI showed R-incompressible likely secondary to calcification and L-arteries (1.3, 1.4, mild calcification) this improved on discharge. Pt with frequent falls at home and weakness and PT recommend SNF placement and pt was transferred to [facility] for ongoing rehab.  Review of Systems:  Review of Systems  Constitutional: Positive for weight loss. Negative for fever, chills and malaise/fatigue.  Respiratory: Positive for cough. Negative for shortness of breath.   Cardiovascular: Negative for chest pain and leg swelling.  Gastrointestinal: Negative for heartburn, abdominal pain, diarrhea and constipation.  Genitourinary: Negative for dysuria.  Musculoskeletal: Positive for falls. Negative for joint pain and myalgias.  Skin:       Wound on sacrum  Neurological: Positive for dizziness (upon  standing; reports this has improved) and weakness. Negative for headaches.  Psychiatric/Behavioral: Negative for depression. The patient is not nervous/anxious.      Past Medical History  Diagnosis Date  . Long-term (current) use of anticoagulants   . Glaucoma   . Gout   . Hyperthyroidism   . Orthostatic hypotension   . Redundant prepuce and phimosis   . Acute cystitis   . Hypertonicity of bladder   . HTN (hypertension)   . Atelectasis   . Dysautonomia   . Pacemaker- [employer] Judes     DOI 2012  . CHF (congestive heart failure)     Previous preserved EF.  [name] Atrial fibrillation   . Sinus node dysfunction   . Pneumonia 08/2011    after hernia OR  . Hypothyroidism   . Sleep apnea     does not use cpap ; last used ~ 2011 (03/10/12)  . Spinal stenosis of lumbar region   . Chronic kidney disease [facility]     UTI  1 MO AGO TX MEDICALLY   . Osteomyelitis of toe of right foot ~ 2009    amputated 2nd toe   Past Surgical History  Procedure Laterality Date  . Elbow bursa surgery  04/07/2006    Right elbow septic olecranon bursitis  . Toe amputation  ~ 2009    Osteomyelitis, right foot second toe /  Right foot second ray amputation  . Cardiac catheterization  07/03/2002    EF 60-70% /  Normal left ventricular function. / Very mild trivial three-vessel coronary atherosclerosis. / There is no mitral regurgitation noted  . Tonsillectomy and adenoidectomy      "  when I was a youngster"  . Appendectomy      "when I was a youngster"  . Inguinal hernia repair  ~ 2003/13    right  . Cataract extraction w/ intraocular lens  implant, bilateral  01/2005   Social History:   reports that he quit smoking about 47 years ago. His smoking use included Cigarettes. He has a 25 pack-year smoking history. He has never used smokeless tobacco. He reports that he does not drink alcohol or use illicit drugs.  Family History  Problem Relation Age of Onset  . Cancer Mother 63    GYN     Medications: Patient's Medications  New Prescriptions   No medications on file  Previous Medications   AMINO ACIDS-PROTEIN HYDROLYS (FEEDING SUPPLEMENT, PRO-STAT SUGAR FREE 64,) LIQD    Take 30 mLs by mouth 3 (three) times daily with meals.   COLLAGENASE (SANTYL) OINTMENT    Apply topically daily.   DIGOXIN (LANOXIN) 0.125 MG TABLET    Take 1 tablet (125 mcg total) by mouth daily.   DORZOLAMIDE-TIMOLOL (COSOPT) 22.3-6.8 MG/ML OPHTHALMIC SOLUTION    Place 1 drop into both eyes daily.    FEEDING SUPPLEMENT, ENSURE, (ENSURE) PUDG    Take 1 Container by mouth 2 (two) times daily between meals.   FINASTERIDE (PROSCAR) 5 MG TABLET    Take 5 mg by mouth daily.    FUROSEMIDE (LASIX) 20 MG TABLET    Take 1 tablet (20 mg total) by mouth every other day.   LEVOTHYROXINE (SYNTHROID, LEVOTHROID) 150 MCG TABLET    Take 150 mcg by mouth daily before breakfast.   MECLIZINE (ANTIVERT) 25 MG TABLET    Take 25 mg by mouth 3 (three) times daily as needed for dizziness.   MIDODRINE (PROAMATINE) 10 MG TABLET    Take 10 mg by mouth 2 (two) times daily.   MULTIPLE VITAMIN (MULTIVITAMIN WITH MINERALS) TABS    Take 1 tablet by mouth daily.   PYRIDOSTIGMINE (MESTINON) 60 MG TABLET    Take 60 mg by mouth 3 (three) times daily. Breakfast lunch and dinner   TRAMADOL (ULTRAM) 50 MG TABLET    Take 1 tablet (50 mg total) by mouth every 12 (twelve) hours as needed for moderate pain.  Modified Medications   No medications on file  Discontinued Medications   No medications on file     Physical Exam: Physical Exam  Constitutional: No distress.  Thin male in NAD  HENT:  Mouth/Throat: Oropharynx is clear and moist. No oropharyngeal exudate.  Eyes: Conjunctivae and EOM are normal. Pupils are equal, round, and reactive to light.  Neck: Normal range of motion. Neck supple.  Cardiovascular: Normal rate, regular rhythm and normal heart sounds.   Pulmonary/Chest: Effort normal. No respiratory distress.  Diminished    Abdominal: Soft. Bowel sounds are normal. He exhibits no distension.  Musculoskeletal: Normal range of motion. He exhibits no edema and no tenderness.  Lymphadenopathy:    He has no cervical adenopathy.  Neurological: He is alert.  Skin: Skin is warm and dry. He is not diaphoretic.  Wound on sacrum CDI    Filed Vitals:   07/13/13 1158  BP: 95/59  Pulse: 62  Temp: 97 F (36.1 C)  Resp: 17  SpO2: 98%      Labs reviewed: Basic Metabolic Panel:  Recent Labs  69/62/95 0555 07/10/13 1448 07/11/13 0603  NA 130* 130* 134*  K 5.9* 5.7* 5.0  CL 95* 95* 99  CO2 30 25 27  GLUCOSE 117* 136* 128*  BUN 26* 31* 30*  CREATININE 1.27 1.19 1.23  CALCIUM 9.3 9.6 9.3   Liver Function Tests:  Recent Labs  07/03/13 0615  AST 22  ALT 14  ALKPHOS 84  BILITOT 0.9  PROT 6.4  ALBUMIN 2.7*   No results found for this basename: LIPASE, AMYLASE,  in the last 8760 hours No results found for this basename: AMMONIA,  in the last 8760 hours CBC:  Recent Labs  10/06/12 1437 02/07/13 0835  07/02/13 1611  07/09/13 0718 07/10/13 0555 07/11/13 0603  WBC 6.1 5.9  < > 7.8  < > 8.0 10.9* 11.9*  NEUTROABS 3.6 4.3  --  5.7  --   --   --   --   HGB 12.9* 13.4  < > 11.4*  < > 12.8* 14.1 13.6  HCT 39.1 40.2  < > 33.4*  < > 39.0 42.7 41.3  MCV 94.2 94.4  < > 92.0  < > 93.3 94.1 93.7  PLT 143.0* 129*  < > 206  < > 266 275 284  < > = values in this interval not displayed.  Lab Results  Component Value Date   TSH 0.056* 03/10/2012   Lab Results  Component Value Date   DIGOXIN 0.5* 07/02/2013     Radiological Exams: CT HEAD FINDINGS   Mild soft tissue changes are noted within the left maxillary antrum   likely of a chronic nature. The bony calvarium is intact. Atrophic   changes are identified as well as chronic white matter ischemic   change. No findings to suggest acute hemorrhage, acute infarction or   space-occupying mass lesion are noted.   CT CERVICAL SPINE FINDINGS   Seven  cervical segments are well visualized. Vertebral body height   is well maintained. Multilevel facet hypertrophic changes are noted.   No acute fracture or acute facet abnormality is noted. The   surrounding soft tissues are within normal limits with the exception   of carotid calcifications.   IMPRESSION:   CT head: Chronic changes without acute abnormality.   CT of the cervical spine: Degenerative change without acute   abnormality.  Assessment/Plan 1. Orthostasis -conts on midodrine 10 mg BID; remains with dizziness on standing but reports this has improved  2. Chronic diastolic heart failure conts on lasix 20 mg QOD, will follow up BMP   3. Atrial fibrillation -rate controlled, currently on dig 0.125 daily  4. Acute exacerbation of CHF (congestive heart failure) -improved, weight remains down and no swelling or worsening Shortness of breath, will get daily weights except for sundays   5. Decubitus ulcer of sacral region, stage 3 -conts with santyl and followed by treatment nurse and wound care MD at the facility   6. Debility -here for STR, strength training and nutritional support, followed by PT/OT, ST and RD

## 2013-07-18 ENCOUNTER — Encounter: Payer: Self-pay | Admitting: *Deleted

## 2013-07-18 ENCOUNTER — Non-Acute Institutional Stay (SKILLED_NURSING_FACILITY): Payer: Medicare Other | Admitting: Internal Medicine

## 2013-07-18 ENCOUNTER — Encounter: Payer: Self-pay | Admitting: Internal Medicine

## 2013-07-18 DIAGNOSIS — I509 Heart failure, unspecified: Secondary | ICD-10-CM

## 2013-07-18 DIAGNOSIS — Z950 Presence of cardiac pacemaker: Secondary | ICD-10-CM

## 2013-07-18 DIAGNOSIS — G7000 Myasthenia gravis without (acute) exacerbation: Secondary | ICD-10-CM

## 2013-07-18 DIAGNOSIS — Q078 Other specified congenital malformations of nervous system: Secondary | ICD-10-CM

## 2013-07-18 DIAGNOSIS — L98491 Non-pressure chronic ulcer of skin of other sites limited to breakdown of skin: Secondary | ICD-10-CM

## 2013-07-18 DIAGNOSIS — W19XXXA Unspecified fall, initial encounter: Secondary | ICD-10-CM

## 2013-07-18 DIAGNOSIS — L89153 Pressure ulcer of sacral region, stage 3: Secondary | ICD-10-CM

## 2013-07-18 DIAGNOSIS — W19XXXD Unspecified fall, subsequent encounter: Secondary | ICD-10-CM

## 2013-07-18 DIAGNOSIS — L8993 Pressure ulcer of unspecified site, stage 3: Secondary | ICD-10-CM

## 2013-07-18 DIAGNOSIS — L89109 Pressure ulcer of unspecified part of back, unspecified stage: Secondary | ICD-10-CM

## 2013-07-18 DIAGNOSIS — I4891 Unspecified atrial fibrillation: Secondary | ICD-10-CM

## 2013-07-18 DIAGNOSIS — J441 Chronic obstructive pulmonary disease with (acute) exacerbation: Secondary | ICD-10-CM

## 2013-07-18 DIAGNOSIS — L98499 Non-pressure chronic ulcer of skin of other sites with unspecified severity: Secondary | ICD-10-CM

## 2013-07-18 DIAGNOSIS — E039 Hypothyroidism, unspecified: Secondary | ICD-10-CM

## 2013-07-18 NOTE — Assessment & Plan Note (Signed)
Presented with dyspnea and 2+ edema; cards was consulted-rec d/c fliorinef, flomax and eventually Lasix 20 mg QOD. Pt was over diuresed in hospital-so had low BP several days and lasix held. Admitted at 202 pounds, down 36 pounds in hospital; will continue lasix QOD; plan restart fliorinef bacause pt's BPs have been very low here and he is weak and dizzy- SEE AUTONOMIA

## 2013-07-18 NOTE — Assessment & Plan Note (Signed)
TSH on 12/5 was 5.854- could leave at 150 mcg or go up another 12.5 mcg- will leave on current dose for now; repeat TSH in several weeks

## 2013-07-18 NOTE — Assessment & Plan Note (Signed)
Pt has a long h/o orthostatic hypotension. In hospital his fliorinef was stopped while he was being diuresed.Pt is on midodrine 10 mg TID, still BP here  90/52,88/38, 92/60, 90/52 and he is symptomatic so I plan to restart fliorinef 0.1 mg daily. Pt is also on mestinon with SE of low BP.  Will watch weights

## 2013-07-18 NOTE — Progress Notes (Signed)
MRN: [id number] Name: Marco Gregory  Sex: male Age: 79 y.o. DOB: 05-Aug-1921  PSC #: [name] Facility/Room: 305 B Level Of Care: SNF Provider: Merrilee Seashore D Emergency Contacts: Extended Emergency Contact Information Primary Emergency Contact: [name] [place] Home Phone: [phone] Mobile Phone: [phone] Relation: Daughter Secondary Emergency Contact: [name] Address: [address]          [place], [place] [address] [place] Home Phone: [phone] Relation: Spouse  Code Status: DNR  Allergies: Review of patient's allergies indicates no known allergies.  Chief Complaint  Patient presents with  . Acute Visit  . Medical Managment of Chronic Issues    HPI: Patient is 79 y.o. male who had acute on chronic CHF in hospital, h/i orthostatic hypotension, h/o falls and weakness admitted for PT/OT.  Past Medical History  Diagnosis Date  . Long-term (current) use of anticoagulants   . Glaucoma   . Gout   . Hyperthyroidism   . Orthostatic hypotension   . Redundant prepuce and phimosis   . Acute cystitis   . Hypertonicity of bladder   . HTN (hypertension)   . Atelectasis   . Dysautonomia   . Pacemaker- [employer]     DOI 2012  . CHF (congestive heart failure)     Previous preserved EF.  [name] Atrial fibrillation   . Sinus node dysfunction   . Pneumonia 08/2011    after hernia OR  . Hypothyroidism   . Sleep apnea     does not use cpap ; last used ~ 2011 (03/10/12)  . Spinal stenosis of lumbar region   . Chronic kidney disease [facility]     UTI  1 MO AGO TX MEDICALLY   . Osteomyelitis of toe of right foot ~ 2009    amputated 2nd toe  . Hypothyroid     Past Surgical History  Procedure Laterality Date  . Elbow bursa surgery  04/07/2006    Right elbow septic olecranon bursitis  . Toe amputation  ~ 2009    Osteomyelitis, right foot second toe /  Right foot second ray amputation  . Cardiac  catheterization  07/03/2002    EF 60-70% /  Normal left ventricular function. / Very mild trivial three-vessel coronary atherosclerosis. / There is no mitral regurgitation noted  . Tonsillectomy and adenoidectomy      "when I was a youngster"  . Appendectomy      "when I was a youngster"  . Inguinal hernia repair  ~ 2003/13    right  . Cataract extraction w/ intraocular lens  implant, bilateral  01/2005      Medication List       This list is accurate as of: 07/18/13 11:13 AM.  Always use your most recent med list.               collagenase ointment  Commonly known as:  SANTYL  Apply topically daily.     digoxin 0.125 MG tablet  Commonly known as:  LANOXIN  Take 1 tablet (125 mcg total) by mouth daily.     dorzolamide-timolol 22.3-6.8 MG/ML ophthalmic solution  Commonly known as:  COSOPT  Place 1 drop into both eyes daily.     feeding supplement (ENSURE) Pudg  Take 1 Container by mouth 2 (two) times daily between meals.     feeding supplement (PRO-STAT SUGAR FREE 64) Liqd  Take 30 mLs by mouth 3 (three) times daily with meals.  finasteride 5 MG tablet  Commonly known as:  PROSCAR  Take 5 mg by mouth daily.     furosemide 20 MG tablet  Commonly known as:  LASIX  Take 1 tablet (20 mg total) by mouth every other day.     levothyroxine 150 MCG tablet  Commonly known as:  SYNTHROID, LEVOTHROID  Take 150 mcg by mouth daily before breakfast.     meclizine 25 MG tablet  Commonly known as:  ANTIVERT  Take 25 mg by mouth 3 (three) times daily as needed for dizziness.     midodrine 10 MG tablet  Commonly known as:  PROAMATINE  Take 10 mg by mouth 2 (two) times daily.     multivitamin with minerals Tabs tablet  Take 1 tablet by mouth daily.     pyridostigmine 60 MG tablet  Commonly known as:  MESTINON  Take 60 mg by mouth 3 (three) times daily. Breakfast lunch and dinner     traMADol 50 MG tablet  Commonly known as:  ULTRAM  Take 1 tablet (50 mg total) by  mouth every 12 (twelve) hours as needed for moderate pain.        No orders of the defined types were placed in this encounter.    Immunization History  Administered Date(s) Administered  . Influenza,inj,Quad PF,36+ Mos 07/04/2013  . Influenza-Unspecified 07/04/2013    History  Substance Use Topics  . Smoking status: Former Smoker -- 1.00 packs/day for 25 years    Types: Cigarettes    Quit date: 08/09/1965  . Smokeless tobacco: Never Used  . Alcohol Use: No     Comment: "last alcohol ~ 2008"    Family history is noncontributory    Review of Systems  DATA OBTAINED: from patient, nurse GENERAL: Feels weak;nurse reports low BP SKIN: No itching, rash; EYES: No eye pain, redness, discharge EARS: No earache, tinnitus, change in hearing NOSE: No congestion, drainage or bleeding  MOUTH/THROAT: No mouth or tooth pain, No sore throat, No difficulty chewing or swallowing  RESPIRATORY: + cough with some sputum, feels congested in chest, no  wheezing, mild SOB CARDIAC: No chest pain, palpitations, lower extremity edema  GI: No abdominal pain, No N/V/D or constipation, No heartburn or reflux  GU: No dysuria, frequency or urgency, or incontinence  MUSCULOSKELETAL: No unrelieved bone/joint pain NEUROLOGIC: No headache, dizziness or focal weakness PSYCHIATRIC: No overt anxiety or sadness. Sleeps well. No behavior issue.   Filed Vitals:   07/18/13 0949  BP: 98/53  Pulse: 64  Resp: 20    Physical Exam  GENERAL APPEARANCE: Alert, conversant. Appropriately groomed. No acute distress.  SKIN: No diaphoresis rash; covered wound on L leg and sacrum HEAD: Normocephalic, atraumatic  EYES: Conjunctiva/lids clear. Pupils round, reactive. EOMs intact.  EARS: External exam WNL, canals clear. Hearing grossly normal.  NOSE: No deformity or discharge.  MOUTH/THROAT: Lips w/o lesions. RESPIRATORY: Breathing is even, unlabored. Lung sounds are clear  But diffusely dectreased; O2 sat at bedside  per ADAMD was 95% on RA CARDIOVASCULAR: Heart RRR no murmurs, rubs or gallops. No peripheral edema. GASTROINTESTINAL: Abdomen is soft, non-tender, not distended w/ normal bowel sounds. GENITOURINARY: Bladder non tender, not distended  MUSCULOSKELETAL: No abnormal joints or musculature NEUROLOGIC: Oriented X3. Cranial nerves 2-12 grossly intact. Moves all extremities no tremor. PSYCHIATRIC: Mood and affect appropriate to situation, no behavioral issues  Patient Active Problem List   Diagnosis Date Noted  . Hypothyroid   . Decubitus ulcer of sacral region, stage 3 07/03/2013  .  Acute exacerbation of CHF (congestive heart failure) 07/03/2013  . Fall at home 07/03/2013  . Skin ulcer 06/26/2013  . UTI (urinary tract infection) 06/26/2013  . PAF (paroxysmal atrial fibrillation) 08/07/2012  . Long-term (current) use of anticoagulants   . Chronic diastolic heart failure 04/19/2012  . Orthostasis 04/19/2012  . Myasthenia gravis 04/19/2012  . Pancreatic mass 04/19/2012  . COPD exacerbation 03/10/2012  . Overactive bladder 04/05/2011  . Atrial fibrillation 12/03/2008  . DYSAUTONOMIA 12/03/2008  . 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 OBSTRUCTIVE SLEEP APNEA 11/24/2007    CBC    Component Value Date/Time   WBC 11.9* 07/11/2013 0603   WBC 7.5 06/07/2012 1607   RBC 4.41 07/11/2013 0603   RBC 4.52* 06/07/2012 1607   HGB 13.6 07/11/2013 0603   HGB 13.7* 06/07/2012 1607   HCT 41.3 07/11/2013 0603   HCT 45.2 06/07/2012 1607   PLT 284 07/11/2013 0603   MCV 93.7 07/11/2013 0603   MCV 99.9* 06/07/2012 1607   LYMPHSABS 1.0 07/02/2013 1611   MONOABS 1.0 07/02/2013 1611   EOSABS 0.1 07/02/2013 1611   BASOSABS 0.0 07/02/2013 1611    CMP     Component Value Date/Time   NA 134* 07/11/2013 0603   K 5.0 07/11/2013 0603   CL 99 07/11/2013 0603   CO2 27 07/11/2013 0603   GLUCOSE 128* 07/11/2013 0603   BUN 30* 07/11/2013 0603   CREATININE 1.23 07/11/2013 0603   CREATININE 1.06 06/07/2012 1603   CALCIUM 9.3  07/11/2013 0603   PROT 6.4 07/03/2013 0615   ALBUMIN 2.7* 07/03/2013 0615   AST 22 07/03/2013 0615   ALT 14 07/03/2013 0615   ALKPHOS 84 07/03/2013 0615   BILITOT 0.9 07/03/2013 0615   GFRNONAA 50* 07/11/2013 0603   GFRAA 58* 07/11/2013 0603    Assessment and Plan  Acute exacerbation of CHF (congestive heart failure) Presented with dyspnea and 2+ edema; cards was consulted-rec d/c fliorinef, flomax and eventually Lasix 20 mg QOD. Pt was over diuresed in hospital-so had low BP several days and lasix held. Admitted at 202 pounds, down 36 pounds in hospital; will continue lasix QOD; plan restart fliorinef bacause pt's BPs have been very low here and he is weak and dizzy- SEE AUTONOMIA  DYSAUTONOMIA Pt has a long h/o orthostatic hypotension. In hospital his fliorinef was stopped while he was being diuresed.Pt is on midodrine 10 mg TID, still BP here  90/52,88/38, 92/60, 90/52 and he is symptomatic so I plan to restart fliorinef 0.1 mg daily. Pt is also on mestinon with SE of low BP.  Will watch weights  Atrial fibrillation Plan continue Dig 0.125  Myasthenia gravis Will continue mestinon  Decubitus ulcer of sacral region, stage 3 Received 7 day course of bactrim ibn hospital;wound care is following herre  Skin ulcer This time on L leg-received 7 day bactrim and wound care is seeing here at SNF  Hypothyroid TSH on 12/5 was 5.854- could leave at 150 mcg or go up another 12.5 mcg- will leave on current dose for now; repeat TSH in several weeks  Fall at home  - SNF for PT/OT for  global weakness and h/o falls  COPD exacerbation Pt c/o congestion in chest;he is not wheezing but does have diffusely dec BS - will start alb nebs q^ while awake for 2 days then prn    Margit Hanks, MD

## 2013-07-18 NOTE — Assessment & Plan Note (Signed)
-   SNF for PT/OT for  global weakness and h/o falls

## 2013-07-18 NOTE — Assessment & Plan Note (Signed)
Pt c/o congestion in chest;he is not wheezing but does have diffusely dec BS - will start alb nebs q^ while awake for 2 days then prn

## 2013-07-18 NOTE — Assessment & Plan Note (Signed)
Received 7 day course of bactrim ibn hospital;wound care is following herre

## 2013-07-18 NOTE — Assessment & Plan Note (Signed)
This time on L leg-received 7 day bactrim and wound care is seeing here at [facility]

## 2013-07-18 NOTE — Assessment & Plan Note (Signed)
Will continue mestinon 

## 2013-07-18 NOTE — Assessment & Plan Note (Signed)
Plan continue Dig 0.125

## 2013-07-19 NOTE — Clinical Social Work Placement (Addendum)
    Clinical Social Work Department CLINICAL SOCIAL WORK PLACEMENT NOTE 07/19/2013  Patient:  Marco Gregory, Marco Gregory  Account Number:  [id number] Admit date:  07/02/2013  Clinical Social Worker:  Lupita Leash Cristal Qadir, Theresia Majors  Date/time:  07/11/2013 04:45 PM  Clinical Social Work is seeking post-discharge placement for this patient at the following level of care:   SKILLED NURSING   (*CSW will update this form in Epic as items are completed)     Patient/family provided with [facility] Department of Clinical Social Work's list of facilities offering this level of care within the geographic area requested by the patient (or if unable, by the patient's family).    Patient/family informed of their freedom to choose among providers that offer the needed level of care, that participate in Medicare, Medicaid or managed care program needed by the patient, have an available bed and are willing to accept the patient.    Patient/family informed of [facility]' ownership interest in [facility], as well as of the fact that they are under no obligation to receive care at this facility.  PASARR submitted to EDS on 07/11/2013 PASARR number received from EDS on 07/11/2013  FL2 transmitted to all facilities in geographic area requested by pt/family on  07/11/2013 FL2 transmitted to all facilities within larger geographic area on   Patient informed that his/her managed care company has contracts with or will negotiate with  certain facilities, including the following:   NA     Patient/family informed of bed offers received:  07/11/2013 Patient chooses bed at [facility] Physician recommends and patient chooses bed at    Patient to be transferred to [facility] on  07/12/2013 Patient to be transferred to facility by Ambulance  Sharin Mons)  The following physician request were entered in Epic:   Additional Comments: 07/12/13  DC today to SNF. Prior d/c  plan was for return home on 07/11/13 but family changed their mind at the last moment and requested SNF placement.  DC plan facilitated for today. Patient and daughter Marco Gregory are pleased with d/c plan.  CSW signing off.  Lorri Frederick. Kyri Dai, LCSWA  [phone]

## 2013-07-23 ENCOUNTER — Encounter: Payer: Self-pay | Admitting: Internal Medicine

## 2013-07-23 ENCOUNTER — Non-Acute Institutional Stay (SKILLED_NURSING_FACILITY): Payer: Medicare Other | Admitting: Internal Medicine

## 2013-07-23 DIAGNOSIS — R4182 Altered mental status, unspecified: Secondary | ICD-10-CM

## 2013-07-23 DIAGNOSIS — L8993 Pressure ulcer of unspecified site, stage 3: Secondary | ICD-10-CM

## 2013-07-23 DIAGNOSIS — I5032 Chronic diastolic (congestive) heart failure: Secondary | ICD-10-CM

## 2013-07-23 DIAGNOSIS — L89153 Pressure ulcer of sacral region, stage 3: Secondary | ICD-10-CM

## 2013-07-23 DIAGNOSIS — Z950 Presence of cardiac pacemaker: Secondary | ICD-10-CM

## 2013-07-23 DIAGNOSIS — Q078 Other specified congenital malformations of nervous system: Secondary | ICD-10-CM

## 2013-07-23 DIAGNOSIS — J441 Chronic obstructive pulmonary disease with (acute) exacerbation: Secondary | ICD-10-CM

## 2013-07-23 DIAGNOSIS — G7000 Myasthenia gravis without (acute) exacerbation: Secondary | ICD-10-CM

## 2013-07-23 DIAGNOSIS — E039 Hypothyroidism, unspecified: Secondary | ICD-10-CM

## 2013-07-23 DIAGNOSIS — L89109 Pressure ulcer of unspecified part of back, unspecified stage: Secondary | ICD-10-CM

## 2013-07-23 NOTE — Progress Notes (Signed)
MRN: [id number] Name: Marco Gregory  Sex: male Age: 79 y.o. DOB: 08/09/1921  PSC #: Marco Gregory Facility/Room: 305B Level Of Care: SNF Provider: Merrilee Seashore D Emergency Contacts: Extended Emergency Contact Information Primary Emergency Contact: Marco Gregory [place] Home Phone: [phone] Mobile Phone: [phone] Relation: Daughter Secondary Emergency Contact: Marco Gregory Address: [address]          [place], [place] [address] [place] Home Phone: [phone] Relation: Spouse  Code Status:   Allergies: Review of patient's allergies indicates no known allergies.  Chief Complaint  Patient presents with  . Acute Visit    HPI: Patient is 79 y.o. male who has a decreased responsiveness per pt's daughter as compared to admission.  Past Medical History  Diagnosis Date  . Long-term (current) use of anticoagulants   . Glaucoma   . Gout   . Hyperthyroidism   . Orthostatic hypotension   . Redundant prepuce and phimosis   . Acute cystitis   . Hypertonicity of bladder   . HTN (hypertension)   . Atelectasis   . Dysautonomia   . Pacemaker- [employer]     DOI 2012  . CHF (congestive heart failure)     Previous preserved EF.  Marco Gregory Atrial fibrillation   . Sinus node dysfunction   . Pneumonia 08/2011    after hernia OR  . Hypothyroidism   . Sleep apnea     does not use cpap ; last used ~ 2011 (03/10/12)  . Spinal stenosis of lumbar region   . Chronic kidney disease [facility]     UTI  1 MO AGO TX MEDICALLY   . Osteomyelitis of toe of right foot ~ 2009    amputated 2nd toe  . Hypothyroid     Past Surgical History  Procedure Laterality Date  . Elbow bursa surgery  04/07/2006    Right elbow septic olecranon bursitis  . Toe amputation  ~ 2009    Osteomyelitis, right foot second toe /  Right foot second ray amputation  . Cardiac catheterization  07/03/2002    EF 60-70% /  Normal left ventricular function. / Very  mild trivial three-vessel coronary atherosclerosis. / There is no mitral regurgitation noted  . Tonsillectomy and adenoidectomy      "when I was a youngster"  . Appendectomy      "when I was a youngster"  . Inguinal hernia repair  ~ 2003/13    right  . Cataract extraction w/ intraocular lens  implant, bilateral  01/2005      Medication List       This list is accurate as of: 07/23/13  8:47 PM.  Always use your most recent med list.               collagenase ointment  Commonly known as:  SANTYL  Apply topically daily.     digoxin 0.125 MG tablet  Commonly known as:  LANOXIN  Take 1 tablet (125 mcg total) by mouth daily.     dorzolamide-timolol 22.3-6.8 MG/ML ophthalmic solution  Commonly known as:  COSOPT  Place 1 drop into both eyes daily.     feeding supplement (ENSURE) Pudg  Take 1 Container by mouth 2 (two) times daily between meals.     feeding supplement (PRO-STAT SUGAR FREE 64) Liqd  Take 30 mLs by mouth 3 (three) times daily with meals.     finasteride 5 MG tablet  Commonly known as:  PROSCAR  Take 5  mg by mouth daily.     furosemide 20 MG tablet  Commonly known as:  LASIX  Take 1 tablet (20 mg total) by mouth every other day.     levothyroxine 150 MCG tablet  Commonly known as:  SYNTHROID, LEVOTHROID  Take 150 mcg by mouth daily before breakfast.     meclizine 25 MG tablet  Commonly known as:  ANTIVERT  Take 25 mg by mouth 3 (three) times daily as needed for dizziness.     midodrine 10 MG tablet  Commonly known as:  PROAMATINE  Take 10 mg by mouth 2 (two) times daily.     multivitamin with minerals Tabs tablet  Take 1 tablet by mouth daily.     pyridostigmine 60 MG tablet  Commonly known as:  MESTINON  Take 60 mg by mouth 3 (three) times daily. Breakfast lunch and dinner     traMADol 50 MG tablet  Commonly known as:  ULTRAM  Take 1 tablet (50 mg total) by mouth every 12 (twelve) hours as needed for moderate pain.        No orders of the  defined types were placed in this encounter.    Immunization History  Administered Date(s) Administered  . Influenza,inj,Quad PF,36+ Mos 07/04/2013  . Influenza-Unspecified 07/04/2013    History  Substance Use Topics  . Smoking status: Former Smoker -- 1.00 packs/day for 25 years    Types: Cigarettes    Quit date: 08/09/1965  . Smokeless tobacco: Never Used  . Alcohol Use: No     Comment: "last alcohol ~ 2008"    Review of Systems  DATA OBTAINED: from patient, nurse, daughter GENERAL:   no fevers,+ fatigue, appetite changes SKIN: No itching, rash HEENT: No complaint RESPIRATORY: some cough, wheezing, no SOB CARDIAC: No chest pain, palpitations, lower extremity edema  GI: No abdominal pain, No N/V/D or constipation, No heartburn or reflux  GU: No dysuria, frequency or urgency, or incontinence  MUSCULOSKELETAL: No unrelieved bone/joint pain; buttocks pain is not worse NEUROLOGIC: No headache, dizziness or focal weakness PSYCHIATRIC: No overt anxiety or sadness. Sleeps well.   Filed Vitals:   07/23/13 2042  BP: 114/69  Pulse: 74  Temp: 97.3 F (36.3 C)  Resp: 20    Physical Exam  GENERAL APPEARANCE: appears sick, not toxic, min conversant. Appropriately groomed. No acute distress  SKIN: No diaphoresis rash HEENT: Unremarkable RESPIRATORY: Breathing is even, unlabored. Lung sounds are dec, some rhonchi, mild wheeze  CARDIOVASCULAR: Heart RRR no murmurs, rubs or gallops. No peripheral edema  GASTROINTESTINAL: Abdomen is soft, non-tender, not distended w/ normal bowel sounds.  GENITOURINARY: Bladder non tender, not distended  MUSCULOSKELETAL: No abnormal joints or musculature NEUROLOGIC: Cranial nerves 2-12 grossly intact. Moves all extremities no tremor. PSYCHIATRIC: depressed affect, no behavioral issues  Patient Active Problem List   Diagnosis Date Noted  . Hypothyroid   . Decubitus ulcer of sacral region, stage 3 07/03/2013  . Acute exacerbation of CHF  (congestive heart failure) 07/03/2013  . Fall at home 07/03/2013  . Skin ulcer 06/26/2013  . UTI (urinary tract infection) 06/26/2013  . PAF (paroxysmal atrial fibrillation) 08/07/2012  . Long-term (current) use of anticoagulants   . Chronic diastolic heart failure 04/19/2012  . Orthostasis 04/19/2012  . Myasthenia gravis 04/19/2012  . Pancreatic mass 04/19/2012  . COPD exacerbation 03/10/2012  . Overactive bladder 04/05/2011  . Atrial fibrillation 12/03/2008  . DYSAUTONOMIA 12/03/2008  . 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 OBSTRUCTIVE SLEEP APNEA 11/24/2007    CBC  Component Value Date/Time   WBC 11.9* 07/11/2013 0603   WBC 7.5 06/07/2012 1607   RBC 4.41 07/11/2013 0603   RBC 4.52* 06/07/2012 1607   HGB 13.6 07/11/2013 0603   HGB 13.7* 06/07/2012 1607   HCT 41.3 07/11/2013 0603   HCT 45.2 06/07/2012 1607   PLT 284 07/11/2013 0603   MCV 93.7 07/11/2013 0603   MCV 99.9* 06/07/2012 1607   LYMPHSABS 1.0 07/02/2013 1611   MONOABS 1.0 07/02/2013 1611   EOSABS 0.1 07/02/2013 1611   BASOSABS 0.0 07/02/2013 1611    CMP     Component Value Date/Time   NA 134* 07/11/2013 0603   K 5.0 07/11/2013 0603   CL 99 07/11/2013 0603   CO2 27 07/11/2013 0603   GLUCOSE 128* 07/11/2013 0603   BUN 30* 07/11/2013 0603   CREATININE 1.23 07/11/2013 0603   CREATININE 1.06 06/07/2012 1603   CALCIUM 9.3 07/11/2013 0603   PROT 6.4 07/03/2013 0615   ALBUMIN 2.7* 07/03/2013 0615   AST 22 07/03/2013 0615   ALT 14 07/03/2013 0615   ALKPHOS 84 07/03/2013 0615   BILITOT 0.9 07/03/2013 0615   GFRNONAA 50* 07/11/2013 0603   GFRAA 58* 07/11/2013 0603    Assessment and Plan  MENTAL STATUS CHANGE - pt is on lasix QOD, need to check BUN/Cr, Na, need to check CBC; he looks infected, no urinary c/o but will check U/A and also CXR and BC; pt is very delicate since d/c; cardiology note stated pt may continue to decline  Margit Hanks, MD

## 2013-07-24 ENCOUNTER — Emergency Department (HOSPITAL_COMMUNITY): Payer: Medicare Other

## 2013-07-24 ENCOUNTER — Inpatient Hospital Stay (HOSPITAL_COMMUNITY)
Admission: EM | Admit: 2013-07-24 | Discharge: 2013-07-30 | DRG: 682 | Disposition: A | Discharge status: Skilled Nursing Facility | Payer: Medicare Other | Attending: Family Medicine | Admitting: Family Medicine

## 2013-07-24 ENCOUNTER — Encounter: Payer: Medicare Other | Admitting: Cardiology

## 2013-07-24 DIAGNOSIS — G92 Toxic encephalopathy: Secondary | ICD-10-CM | POA: Diagnosis not present

## 2013-07-24 DIAGNOSIS — Z66 Do not resuscitate: Secondary | ICD-10-CM | POA: Diagnosis present

## 2013-07-24 DIAGNOSIS — N401 Enlarged prostate with lower urinary tract symptoms: Secondary | ICD-10-CM | POA: Diagnosis present

## 2013-07-24 DIAGNOSIS — L89153 Pressure ulcer of sacral region, stage 3: Secondary | ICD-10-CM

## 2013-07-24 DIAGNOSIS — E875 Hyperkalemia: Secondary | ICD-10-CM | POA: Diagnosis present

## 2013-07-24 DIAGNOSIS — R64 Cachexia: Secondary | ICD-10-CM | POA: Diagnosis present

## 2013-07-24 DIAGNOSIS — I129 Hypertensive chronic kidney disease with stage 1 through stage 4 chronic kidney disease, or unspecified chronic kidney disease: Secondary | ICD-10-CM | POA: Diagnosis present

## 2013-07-24 DIAGNOSIS — T424X5A Adverse effect of benzodiazepines, initial encounter: Secondary | ICD-10-CM | POA: Diagnosis present

## 2013-07-24 DIAGNOSIS — I5032 Chronic diastolic (congestive) heart failure: Secondary | ICD-10-CM

## 2013-07-24 DIAGNOSIS — G7000 Myasthenia gravis without (acute) exacerbation: Secondary | ICD-10-CM

## 2013-07-24 DIAGNOSIS — L89109 Pressure ulcer of unspecified part of back, unspecified stage: Secondary | ICD-10-CM | POA: Diagnosis present

## 2013-07-24 DIAGNOSIS — I951 Orthostatic hypotension: Secondary | ICD-10-CM | POA: Diagnosis present

## 2013-07-24 DIAGNOSIS — E039 Hypothyroidism, unspecified: Secondary | ICD-10-CM

## 2013-07-24 DIAGNOSIS — N189 Chronic kidney disease, unspecified: Secondary | ICD-10-CM | POA: Diagnosis present

## 2013-07-24 DIAGNOSIS — G929 Unspecified toxic encephalopathy: Secondary | ICD-10-CM | POA: Diagnosis not present

## 2013-07-24 DIAGNOSIS — G4733 Obstructive sleep apnea (adult) (pediatric): Secondary | ICD-10-CM | POA: Diagnosis present

## 2013-07-24 DIAGNOSIS — Z7901 Long term (current) use of anticoagulants: Secondary | ICD-10-CM

## 2013-07-24 DIAGNOSIS — Z87891 Personal history of nicotine dependence: Secondary | ICD-10-CM

## 2013-07-24 DIAGNOSIS — I959 Hypotension, unspecified: Secondary | ICD-10-CM | POA: Diagnosis present

## 2013-07-24 DIAGNOSIS — N390 Urinary tract infection, site not specified: Secondary | ICD-10-CM

## 2013-07-24 DIAGNOSIS — N179 Acute kidney failure, unspecified: Principal | ICD-10-CM

## 2013-07-24 DIAGNOSIS — R079 Chest pain, unspecified: Secondary | ICD-10-CM

## 2013-07-24 DIAGNOSIS — L8990 Pressure ulcer of unspecified site, unspecified stage: Secondary | ICD-10-CM | POA: Diagnosis present

## 2013-07-24 DIAGNOSIS — N139 Obstructive and reflux uropathy, unspecified: Secondary | ICD-10-CM | POA: Diagnosis present

## 2013-07-24 DIAGNOSIS — Z950 Presence of cardiac pacemaker: Secondary | ICD-10-CM

## 2013-07-24 DIAGNOSIS — J189 Pneumonia, unspecified organism: Secondary | ICD-10-CM

## 2013-07-24 DIAGNOSIS — N138 Other obstructive and reflux uropathy: Secondary | ICD-10-CM | POA: Diagnosis present

## 2013-07-24 DIAGNOSIS — I4891 Unspecified atrial fibrillation: Secondary | ICD-10-CM

## 2013-07-24 DIAGNOSIS — L89309 Pressure ulcer of unspecified buttock, unspecified stage: Secondary | ICD-10-CM | POA: Diagnosis present

## 2013-07-24 DIAGNOSIS — E876 Hypokalemia: Secondary | ICD-10-CM | POA: Diagnosis not present

## 2013-07-24 DIAGNOSIS — I509 Heart failure, unspecified: Secondary | ICD-10-CM | POA: Diagnosis present

## 2013-07-24 DIAGNOSIS — Z8614 Personal history of Methicillin resistant Staphylococcus aureus infection: Secondary | ICD-10-CM

## 2013-07-24 DIAGNOSIS — Z79899 Other long term (current) drug therapy: Secondary | ICD-10-CM

## 2013-07-24 DIAGNOSIS — R339 Retention of urine, unspecified: Secondary | ICD-10-CM

## 2013-07-24 DIAGNOSIS — E870 Hyperosmolality and hypernatremia: Secondary | ICD-10-CM | POA: Diagnosis not present

## 2013-07-24 DIAGNOSIS — G909 Disorder of the autonomic nervous system, unspecified: Secondary | ICD-10-CM | POA: Diagnosis present

## 2013-07-24 DIAGNOSIS — L8993 Pressure ulcer of unspecified site, stage 3: Secondary | ICD-10-CM | POA: Diagnosis present

## 2013-07-24 LAB — CBC WITH DIFFERENTIAL/PLATELET
Basophils Relative: 0 % (ref 0–1)
Eosinophils Absolute: 0 10*3/uL (ref 0.0–0.7)
Eosinophils Relative: 0 % (ref 0–5)
HCT: 38.6 % — ABNORMAL LOW (ref 39.0–52.0)
Hemoglobin: 13.1 g/dL (ref 13.0–17.0)
Lymphocytes Relative: 9 % — ABNORMAL LOW (ref 12–46)
Lymphs Abs: 1.2 10*3/uL (ref 0.7–4.0)
MCH: 30.9 pg (ref 26.0–34.0)
MCHC: 33.9 g/dL (ref 30.0–36.0)
MCV: 91 fL (ref 78.0–100.0)
Monocytes Absolute: 1.4 10*3/uL — ABNORMAL HIGH (ref 0.1–1.0)
Monocytes Relative: 10 % (ref 3–12)
Neutro Abs: 10.9 10*3/uL — ABNORMAL HIGH (ref 1.7–7.7)
Neutrophils Relative %: 81 % — ABNORMAL HIGH (ref 43–77)
RBC: 4.24 MIL/uL (ref 4.22–5.81)

## 2013-07-24 LAB — BASIC METABOLIC PANEL
BUN: 93 mg/dL — ABNORMAL HIGH (ref 6–23)
Calcium: 9.3 mg/dL (ref 8.4–10.5)
Chloride: 96 mEq/L (ref 96–112)
Creatinine, Ser: 5.56 mg/dL — ABNORMAL HIGH (ref 0.50–1.35)
GFR calc Af Amer: 9 mL/min — ABNORMAL LOW (ref >90)
GFR calc non Af Amer: 8 mL/min — ABNORMAL LOW (ref >90)
Glucose, Bld: 138 mg/dL — ABNORMAL HIGH (ref 70–99)

## 2013-07-24 LAB — URINALYSIS, ROUTINE W REFLEX MICROSCOPIC
Glucose, UA: NEGATIVE mg/dL
Ketones, ur: 15 mg/dL — AB
Protein, ur: 100 mg/dL — AB
Urobilinogen, UA: 1 mg/dL (ref 0.0–1.0)

## 2013-07-24 LAB — URINE MICROSCOPIC-ADD ON

## 2013-07-24 LAB — TROPONIN I: Troponin I: <0.3 ng/mL (ref <0.30)

## 2013-07-24 MED ORDER — HEPARIN SODIUM (PORCINE) 5000 UNIT/ML IJ SOLN
5000.0000 [IU] | Freq: Three times a day (TID) | INTRAMUSCULAR | Status: DC
  Administered 2013-07-25 – 2013-07-30 (×17): 5000 [IU] via SUBCUTANEOUS
  Filled 2013-07-24 (×22): qty 1

## 2013-07-24 MED ORDER — DORZOLAMIDE HCL-TIMOLOL MAL 2-0.5 % OP SOLN
1.0000 [drp] | Freq: Every day | OPHTHALMIC | Status: DC
  Administered 2013-07-26 – 2013-07-30 (×5): 1 [drp] via OPHTHALMIC
  Filled 2013-07-24 (×4): qty 10

## 2013-07-24 MED ORDER — MIDODRINE HCL 5 MG PO TABS
10.0000 mg | ORAL_TABLET | Freq: Two times a day (BID) | ORAL | Status: DC
  Administered 2013-07-25: 10 mg via ORAL
  Filled 2013-07-24 (×3): qty 2

## 2013-07-24 MED ORDER — ENSURE PUDDING PO PUDG
1.0000 | Freq: Two times a day (BID) | ORAL | Status: DC
  Administered 2013-07-26 – 2013-07-30 (×8): 1 via ORAL

## 2013-07-24 MED ORDER — SODIUM CHLORIDE 0.9 % IJ SOLN: 3.0000 mL | INTRAMUSCULAR | Status: DC | PRN

## 2013-07-24 MED ORDER — ALBUTEROL SULFATE (5 MG/ML) 0.5% IN NEBU: 2.5000 mg | INHALATION_SOLUTION | RESPIRATORY_TRACT | Status: DC | PRN

## 2013-07-24 MED ORDER — PYRIDOSTIGMINE BROMIDE 60 MG PO TABS
60.0000 mg | ORAL_TABLET | Freq: Three times a day (TID) | ORAL | Status: DC
  Administered 2013-07-25 – 2013-07-30 (×17): 60 mg via ORAL
  Filled 2013-07-24 (×20): qty 1

## 2013-07-24 MED ORDER — SODIUM CHLORIDE 0.9 % IJ SOLN
3.0000 mL | Freq: Two times a day (BID) | INTRAMUSCULAR | Status: DC
  Administered 2013-07-25 – 2013-07-27 (×2): 3 mL via INTRAVENOUS

## 2013-07-24 MED ORDER — LEVOTHYROXINE SODIUM 150 MCG PO TABS
150.0000 ug | ORAL_TABLET | Freq: Every day | ORAL | Status: DC
  Administered 2013-07-25 – 2013-07-30 (×6): 150 ug via ORAL
  Filled 2013-07-24 (×9): qty 1

## 2013-07-24 MED ORDER — DEXTROSE 5 % IV SOLN
1.0000 g | Freq: Once | INTRAVENOUS | Status: AC
  Administered 2013-07-25: 1 g via INTRAVENOUS
  Filled 2013-07-24: qty 10

## 2013-07-24 MED ORDER — ADULT MULTIVITAMIN W/MINERALS CH
1.0000 | ORAL_TABLET | Freq: Every day | ORAL | Status: DC
  Administered 2013-07-25 – 2013-07-30 (×6): 1 via ORAL
  Filled 2013-07-24 (×6): qty 1

## 2013-07-24 MED ORDER — ALBUTEROL SULFATE (5 MG/ML) 0.5% IN NEBU
2.5000 mg | INHALATION_SOLUTION | RESPIRATORY_TRACT | Status: DC | PRN
  Administered 2013-07-25: 2.5 mg via RESPIRATORY_TRACT
  Filled 2013-07-24: qty 0.5

## 2013-07-24 MED ORDER — FINASTERIDE 5 MG PO TABS
5.0000 mg | ORAL_TABLET | Freq: Every day | ORAL | Status: DC
  Administered 2013-07-25 – 2013-07-30 (×6): 5 mg via ORAL
  Filled 2013-07-24 (×6): qty 1

## 2013-07-24 MED ORDER — DIGOXIN 125 MCG PO TABS
125.0000 ug | ORAL_TABLET | Freq: Every day | ORAL | Status: DC
  Administered 2013-07-25 – 2013-07-26 (×2): 125 ug via ORAL
  Filled 2013-07-24 (×2): qty 1

## 2013-07-24 MED ORDER — PRO-STAT SUGAR FREE PO LIQD
30.0000 mL | Freq: Three times a day (TID) | ORAL | Status: DC
  Administered 2013-07-26 – 2013-07-30 (×13): 30 mL via ORAL
  Filled 2013-07-24 (×18): qty 30

## 2013-07-24 MED ORDER — LORAZEPAM 2 MG/ML IJ SOLN
1.0000 mg | INTRAMUSCULAR | Status: DC | PRN
  Administered 2013-07-24: 1 mg via INTRAVENOUS
  Filled 2013-07-24: qty 1

## 2013-07-24 MED ORDER — SODIUM CHLORIDE 0.9 % IV SOLN
250.0000 mL | INTRAVENOUS | Status: DC | PRN
  Administered 2013-07-27 (×2): 250 mL via INTRAVENOUS

## 2013-07-24 NOTE — ED Notes (Signed)
Report given to [place], RN unit 6E.

## 2013-07-24 NOTE — ED Notes (Addendum)
Per [facility]: Pt from [facility]; called [facility] for transport to cardiology for pacemaker check when [facility] noticed pt to have bilateral wheezes/rales; 95% RA. Pt given 1 breathing tx. Pt AO at baseline. Pt reports chronic lower back pain. Per [place], pt had chest xray that was negative.  138/70. 59 bpm. 95% RA. 20 RR.

## 2013-07-24 NOTE — ED Provider Notes (Signed)
CSN: [id number]     Arrival date & time 07/24/13  1526 History   First MD Initiated Contact with Patient 07/24/13 1527     Chief Complaint  Patient presents with  . Respiratory Distress    HPI  79 year old male from [facility].  She complained shortness of breath. The patient had increasing shortness of breath over the last 2-3 days. Per the daughter's report who arrived shortly after the patient, he has had progressive dyspnea over last 4-5 days. He agreed treatment/plan this morning. She states that some blood was obtained last night as well as an x-ray. She states that they told her at the nursing home today that his creatinine had gone away out". When asked if he is still urinating he states yes. However upon arrival he is obviously in urinary retention with a distended bladder. He's had a "wet sounding" cough with dyspnea and a nonproductive cough. No known fever. He has a complaint of back pain which is rather chronic for sacral decubiti. He has some healing wounds on his lower extremities.  Past Medical History  Diagnosis Date  . Long-term (current) use of anticoagulants   . Glaucoma   . Gout   . Hyperthyroidism   . Orthostatic hypotension   . Redundant prepuce and phimosis   . Acute cystitis   . Hypertonicity of bladder   . HTN (hypertension)   . Atelectasis   . Dysautonomia   . Pacemaker- [employer]     DOI 2012  . CHF (congestive heart failure)     Previous preserved EF.  [name] Atrial fibrillation   . Sinus node dysfunction   . Pneumonia 08/2011    after hernia OR  . Hypothyroidism   . Sleep apnea     does not use cpap ; last used ~ 2011 (03/10/12)  . Spinal stenosis of lumbar region   . Chronic kidney disease [facility]     UTI  1 MO AGO TX MEDICALLY   . Osteomyelitis of toe of right foot ~ 2009    amputated 2nd toe  . Hypothyroid    Past Surgical History  Procedure Laterality Date  . Elbow bursa surgery  04/07/2006    Right elbow septic olecranon  bursitis  . Toe amputation  ~ 2009    Osteomyelitis, right foot second toe /  Right foot second ray amputation  . Cardiac catheterization  07/03/2002    EF 60-70% /  Normal left ventricular function. / Very mild trivial three-vessel coronary atherosclerosis. / There is no mitral regurgitation noted  . Tonsillectomy and adenoidectomy      "when I was a youngster"  . Appendectomy      "when I was a youngster"  . Inguinal hernia repair  ~ 2003/13    right  . Cataract extraction w/ intraocular lens  implant, bilateral  01/2005   Family History  Problem Relation Age of Onset  . Cancer Mother 42    GYN   History  Substance Use Topics  . Smoking status: Former Smoker -- 1.00 packs/day for 25 years    Types: Cigarettes    Quit date: 08/09/1965  . Smokeless tobacco: Never Used  . Alcohol Use: No     Comment: "last alcohol ~ 2008"    Review of Systems  Constitutional: Positive for appetite change, fatigue and unexpected weight change. Negative for fever, chills and diaphoresis.  HENT: Positive for congestion. Negative for mouth sores, sore throat and trouble swallowing.   Eyes:  Negative for redness and visual disturbance.  Respiratory: Positive for cough and shortness of breath. Negative for chest tightness and wheezing.   Cardiovascular: Negative for chest pain, palpitations and leg swelling.  Gastrointestinal: Positive for abdominal pain and abdominal distention. Negative for nausea, vomiting and diarrhea.  Endocrine: Negative for polydipsia, polyphagia and polyuria.  Genitourinary: Positive for decreased urine volume. Negative for dysuria, frequency and hematuria.  Musculoskeletal: Positive for back pain. Negative for gait problem.  Skin: Negative for color change, pallor and rash.  Neurological: Negative for dizziness, syncope, light-headedness and headaches.  Hematological: Does not bruise/bleed easily.  Psychiatric/Behavioral: Negative for behavioral problems and confusion.     Allergies  Review of patient's allergies indicates no known allergies.  Home Medications   Current Outpatient Rx  Name  Route  Sig  Dispense  Refill  . Amino Acids-Protein Hydrolys (FEEDING SUPPLEMENT, PRO-STAT SUGAR FREE 64,) LIQD   Oral   Take 30 mLs by mouth 3 (three) times daily with meals.   900 mL   0   . collagenase (SANTYL) ointment   Topical   Apply topically daily.   15 g   0   . digoxin (LANOXIN) 0.125 MG tablet   Oral   Take 1 tablet (125 mcg total) by mouth daily.   90 tablet   3   . dorzolamide-timolol (COSOPT) 22.3-6.8 MG/ML ophthalmic solution   Both Eyes   Place 1 drop into both eyes daily.          . feeding supplement, ENSURE, (ENSURE) PUDG   Oral   Take 1 Container by mouth 2 (two) times daily between meals.   60 Container   0   . finasteride (PROSCAR) 5 MG tablet   Oral   Take 5 mg by mouth daily.          . furosemide (LASIX) 20 MG tablet   Oral   Take 1 tablet (20 mg total) by mouth every other day.   15 tablet   0   . levothyroxine (SYNTHROID, LEVOTHROID) 150 MCG tablet   Oral   Take 150 mcg by mouth daily before breakfast.         . meclizine (ANTIVERT) 25 MG tablet   Oral   Take 25 mg by mouth 3 (three) times daily as needed for dizziness.         . midodrine (PROAMATINE) 10 MG tablet   Oral   Take 10 mg by mouth 2 (two) times daily.         . Multiple Vitamin (MULTIVITAMIN WITH MINERALS) TABS   Oral   Take 1 tablet by mouth daily.         [name] pyridostigmine (MESTINON) 60 MG tablet   Oral   Take 60 mg by mouth 3 (three) times daily. Breakfast lunch and dinner         . traMADol (ULTRAM) 50 MG tablet   Oral   Take 1 tablet (50 mg total) by mouth every 12 (twelve) hours as needed for moderate pain.   60 tablet   5    BP 122/57  Pulse 62  Temp(Src) 98.8 F (37.1 C) (Rectal)  Resp 18  SpO2 98% Physical Exam  Constitutional: He appears cachectic. He appears ill.  79 year old male short of breath.  Very hard of hearing. Oriented x2.  HENT:  Conjunctiva are not pale.  Eyes: No scleral icterus.  Neck:  No JVD  Cardiovascular: Exam reveals no S3 and no S4.   Paced  rhythm. Intermittent native beats with irregularity. No gallop  Pulmonary/Chest: He has rhonchi in the right upper field, the right middle field, the right lower field, the left upper field, the left middle field and the left lower field.  Tachypnea with scattered rhonchi and a frequent cough  Abdominal:    Genitourinary:  Urinary retention. Normal appearing penis and scrotum  Musculoskeletal:  Diffuse muscle wasting  Neurological: He is alert.  Mild diffuse weakness. No hemiparesis.  Skin:  Sacral decubiti    ED Course  Procedures (including critical care time) Labs Review Labs Reviewed  CBC WITH DIFFERENTIAL - Abnormal; Notable for the following:    WBC 13.5 (*)    HCT 38.6 (*)    Neutrophils Relative % 81 (*)    Lymphocytes Relative 9 (*)    Neutro Abs 10.9 (*)    Monocytes Absolute 1.4 (*)    All other components within normal limits  BASIC METABOLIC PANEL - Abnormal; Notable for the following:    Potassium 5.5 (*)    Glucose, Bld 138 (*)    BUN 93 (*)    Creatinine, Ser 5.56 (*)    GFR calc non Af Amer 8 (*)    GFR calc Af Amer 9 (*)    All other components within normal limits  PRO B NATRIURETIC PEPTIDE - Abnormal; Notable for the following:    Pro B Natriuretic peptide (BNP) 8586.0 (*)    All other components within normal limits  URINALYSIS, ROUTINE W REFLEX MICROSCOPIC - Abnormal; Notable for the following:    Color, Urine BROWN (*)    APPearance TURBID (*)    Hgb urine dipstick LARGE (*)    Bilirubin Urine SMALL (*)    Ketones, ur 15 (*)    Protein, ur 100 (*)    Nitrite POSITIVE (*)    Leukocytes, UA LARGE (*)    All other components within normal limits  URINE MICROSCOPIC-ADD ON - Abnormal; Notable for the following:    Squamous Epithelial / LPF FEW (*)    Bacteria, UA MANY (*)     All other components within normal limits  URINE CULTURE  TROPONIN I   Imaging Review Dg Chest Port 1 View  07/24/2013   CLINICAL DATA:  Respiratory distress.  EXAM: PORTABLE CHEST - 1 VIEW  COMPARISON:  07/02/2013  FINDINGS: Left-sided dual lead pacemaker is unchanged. Marked enlargement of the cardiac silhouette remains. Thoracic aortic calcifications are noted. Elevation of the left hemidiaphragm with mild left basilar atelectasis is unchanged. Mild pulmonary vascular congestion is unchanged. There is no evidence of new airspace consolidation or overt edema. No large pleural effusion is identified. No acute osseous abnormality is seen.  IMPRESSION: Unchanged cardiomegaly and mild pulmonary vascular congestion. No evidence of pneumonia or overt pulmonary edema.   Electronically Signed   By: Sebastian Ache   On: 07/24/2013 16:41    EKG Interpretation   None       MDM   1. Acute renal failure   2. Urinary retention     Initial impression is urinary retention per the report of renal failure this is likely post renal and obstructive. We'll recheck his urinalysis and renal function here. Chest x-ray is pending. Nebulized albuterol and Atrovent given.  Diuresis over 1200 cc. He is in acute renal failure. Potassium is 5.5. He is paced. Plan I discussed the case with family medicine where he is followed. He was admitted to their care.   Roney Marion, MD 07/24/13  2018 

## 2013-07-24 NOTE — ED Notes (Signed)
Admiting MD at bedside

## 2013-07-24 NOTE — H&P (Signed)
Family Medicine Teaching [facility] Admission History and Physical Service Pager: [phone]  Patient name: Marco Gregory Medical record number: [id number] Date of birth: [birth year] Age: 79 y.o. Gender: male  Primary Care Provider: Elvina Sidle, MD Consultants: none Code Status: DNR  Chief Complaint: "rattly" sounding  Assessment and Plan: Marco Gregory is a 79 y.o. male presenting with urinary retention and AKI . PMH is significant for myasthenia gravis, sacral decub, OSA, afib, HFpEF.  # AKI on CKD: patient with Cr to 5.56 with apparent baseline of 1.2. Patient with likely obstruction as a cause given palpable bladder on ED physician exam and post-obstructive diuresis after foley placed. -will admit to observation status, attending Dr Randolm Idol -will leave foley in place to allow for continued drainage of urine - suspect relief of bladder outlet obstruction will improve AKI -will trend Cr on daily BMET -will hold home lasix given elevated in Cr -will continue home finasteride for BPH  # Altered mental status: upon my evaluation in the ED the patient was difficult to arouse. Nursing noted he received ativan about 20-30 minutes prior to my seeing him and he became progressively more sleepy. I suspect that this is related to the ativan as per report the patient was acting at his baseline prior to my arrival. -continue to monitor -if not improving will obtain CT head, UDS, alcohol level, complete cycle of troponins  # CHF: patient with BNP to 8586, increased from 2389 3 weeks ago. Patient does not appear volume overloaded on exam without LE edema and without apparent crackles on exam. CXR without overt signs of pulmonary edema. Potentially BNP elevated related to AKI. -will continue to monitor volume status with daily weights and strict I/O's -will hold home lasix at this time given Cr elevation - suspect he will diurese well on his own with the foley in place -troponin  negative x1  # UTI: UA turbid with positive Nitrites and large leukocytes -will treat with one dose of CTX and make determination of additional antibiotics tomorrow -obtain blood cultures prior to antibiotics given elevation in WBC from previously  # Hyperkalemia: 5.5 on admission. Suspect this is related to acute renal failure.  -will monitor on am BMET -will give dose of PO kayexelate   # Paroxysmal atrial fibrillation  -Continuing home Digoxin   # LE ulcer and Sacral Decubitus ulcer (Stage 3): sacral ulcer does not appear infected at this time -Wound care consult   -continue to monitor for infection  # Dysautonomia and hypotension  - Continuing home Midodrine   # Hypothyroidism  - Continuing home synthroid   # Myasthenia  - Will continue [place].   FEN/GI: Saline Lock IV; NPO until more awake Prophylaxis: Heparin SQ   Disposition: admit to tele, discharge pending clinical improvement   History of Present Illness: Marco Gregory is a 79 y.o. male presenting with urinary retention. Patient is unable to answer questions at the time of the evaluation due to having received ativan, thus a level V caveat applies. Daughter gives some history. Notes patient was to go to have pacemaker checked on today and has to be transported by ambulance. EMTs noted that he "sounded rattly" so they brought him to the ED. In the ED he was found to have a wet cough and some reported dyspnea that responded well to a breathing treatment. He was also noted to be in urinary retention with a palpable bladder on exam. A foley was placed and returned 1200 cc urine. His labs  revealed a Cr to 5.56, BUN 93, potassium 5.5, WBC 13.5, neutrophil % 81, BNP 8586, UA with turbid appearance, large Hgb, 15 ketones, 100 protein, positive nitrites, and large leuks. He had a negative troponin. CXR revealed no pulmonary edema or acute processes. Note that patient received ativan about 30 minutes prior to my evaluation  and was difficult to arouse. He was able to open his eyes and fixate on me and the nurse as we were able to turn him, though he did not answer any questions.  Review Of Systems: Per HPI with the following additions: unable to perform given mental status. Otherwise 12 point review of systems was performed and was unremarkable.  Patient Active Problem List   Diagnosis Date Noted  . Acute renal failure 07/24/2013  . Hypothyroid   . Decubitus ulcer of sacral region, stage 3 07/03/2013  . Acute exacerbation of CHF (congestive heart failure) 07/03/2013  . Fall at home 07/03/2013  . Skin ulcer 06/26/2013  . UTI (urinary tract infection) 06/26/2013  . PAF (paroxysmal atrial fibrillation) 08/07/2012  . Long-term (current) use of anticoagulants   . Chronic diastolic heart failure 04/19/2012  . Orthostasis 04/19/2012  . Myasthenia gravis 04/19/2012  . Pancreatic mass 04/19/2012  . COPD exacerbation 03/10/2012  . Overactive bladder 04/05/2011  . Atrial fibrillation 12/03/2008  . DYSAUTONOMIA 12/03/2008  . 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 OBSTRUCTIVE SLEEP APNEA 11/24/2007   Past Medical History: Past Medical History  Diagnosis Date  . Long-term (current) use of anticoagulants   . Glaucoma   . Gout   . Hyperthyroidism   . Orthostatic hypotension   . Redundant prepuce and phimosis   . Acute cystitis   . Hypertonicity of bladder   . HTN (hypertension)   . Atelectasis   . Dysautonomia   . Pacemaker- [employer]     DOI 2012  . CHF (congestive heart failure)     Previous preserved EF.  [name] Atrial fibrillation   . Sinus node dysfunction   . Pneumonia 08/2011    after hernia OR  . Hypothyroidism   . Sleep apnea     does not use cpap ; last used ~ 2011 (03/10/12)  . Spinal stenosis of lumbar region   . Chronic kidney disease [facility]     UTI  1 MO AGO TX MEDICALLY   . Osteomyelitis of toe of right foot ~ 2009    amputated 2nd toe  . Hypothyroid    Past Surgical History: Past Surgical  History  Procedure Laterality Date  . Elbow bursa surgery  04/07/2006    Right elbow septic olecranon bursitis  . Toe amputation  ~ 2009    Osteomyelitis, right foot second toe /  Right foot second ray amputation  . Cardiac catheterization  07/03/2002    EF 60-70% /  Normal left ventricular function. / Very mild trivial three-vessel coronary atherosclerosis. / There is no mitral regurgitation noted  . Tonsillectomy and adenoidectomy      "when I was a youngster"  . Appendectomy      "when I was a youngster"  . Inguinal hernia repair  ~ 2003/13    right  . Cataract extraction w/ intraocular lens  implant, bilateral  01/2005   Social History: History  Substance Use Topics  . Smoking status: Former Smoker -- 1.00 packs/day for 25 years    Types: Cigarettes    Quit date: 08/09/1965  . Smokeless tobacco: Never Used  . Alcohol Use: No  Comment: "last alcohol ~ 2008"   Additional social history: [facility]  Please also refer to relevant sections of EMR.  Family History: Family History  Problem Relation Age of Onset  . Cancer Mother 100    GYN   Allergies and Medications: No Known Allergies No current facility-administered medications on file prior to encounter.   Current Outpatient Prescriptions on File Prior to Encounter  Medication Sig Dispense Refill  . Amino Acids-Protein Hydrolys (FEEDING SUPPLEMENT, PRO-STAT SUGAR FREE 64,) LIQD Take 30 mLs by mouth 3 (three) times daily with meals.  900 mL  0  . collagenase (SANTYL) ointment Apply topically daily.  15 g  0  . digoxin (LANOXIN) 0.125 MG tablet Take 1 tablet (125 mcg total) by mouth daily.  90 tablet  3  . dorzolamide-timolol (COSOPT) 22.3-6.8 MG/ML ophthalmic solution Place 1 drop into both eyes daily.       . feeding supplement, ENSURE, (ENSURE) PUDG Take 1 Container by mouth 2 (two) times daily between meals.  60 Container  0  . finasteride (PROSCAR) 5 MG tablet Take 5 mg by mouth daily.        . furosemide (LASIX) 20 MG tablet Take 1 tablet (20 mg total) by mouth every other day.  15 tablet  0  . levothyroxine (SYNTHROID, LEVOTHROID) 150 MCG tablet Take 150 mcg by mouth daily before breakfast.      . meclizine (ANTIVERT) 25 MG tablet Take 25 mg by mouth 3 (three) times daily as needed for dizziness.      . midodrine (PROAMATINE) 10 MG tablet Take 10 mg by mouth 2 (two) times daily.      . Multiple Vitamin (MULTIVITAMIN WITH MINERALS) TABS Take 1 tablet by mouth daily.      [name] pyridostigmine (MESTINON) 60 MG tablet Take 60 mg by mouth 3 (three) times daily. Breakfast lunch and dinner      . traMADol (ULTRAM) 50 MG tablet Take 1 tablet (50 mg total) by mouth every 12 (twelve) hours as needed for moderate pain.  60 tablet  5    Objective: BP 102/56  Pulse 65  Temp(Src) 98.8 F (37.1 C) (Rectal)  Resp 21  SpO2 96% Exam: General: NAD, patient is asleep on evaluation, will wake up to vigorous stimuli HEENT: NCAT, MMM, PERRL Cardiovascular: rrr, no murmurs appreciated Respiratory: coarse referred upper airway sounds, clear at the bases Abdomen: soft, apparently non-tender, non-distended Extremities: no edema noted in LE Skin: right buttock stage 3 decubitus ulcer non-infected appearing, LLE with bandage in place Neuro: sleeping, will awake to vigorous stimuli, though is non-verbal on waking  Labs and Imaging: CBC BMET   Recent Labs Lab 07/24/13 1549  WBC 13.5*  HGB 13.1  HCT 38.6*  PLT 322    Recent Labs Lab 07/24/13 1549  NA 135  K 5.5*  CL 96  CO2 22  BUN 93*  CREATININE 5.56*  GLUCOSE 138*  CALCIUM 9.3     Results for orders placed during the hospital encounter of 07/24/13 (from the past 24 hour(s))  CBC WITH DIFFERENTIAL     Status: Abnormal   Collection Time    07/24/13  3:49 PM      Result Value Range   WBC 13.5 (*) 4.0 - 10.5 K/uL   RBC 4.24  4.22 - 5.81 MIL/uL   Hemoglobin 13.1  13.0 - 17.0 g/dL   HCT 16.1 (*) 09.6 - 04.5 %   MCV 91.0  78.0 -  100.0  fL   MCH 30.9  26.0 - 34.0 pg   MCHC 33.9  30.0 - 36.0 g/dL   RDW 16.1  09.6 - 04.5 %   Platelets 322  150 - 400 K/uL   Neutrophils Relative % 81 (*) 43 - 77 %   Lymphocytes Relative 9 (*) 12 - 46 %   Monocytes Relative 10  3 - 12 %   Eosinophils Relative 0  0 - 5 %   Basophils Relative 0  0 - 1 %   Neutro Abs 10.9 (*) 1.7 - 7.7 K/uL   Lymphs Abs 1.2  0.7 - 4.0 K/uL   Monocytes Absolute 1.4 (*) 0.1 - 1.0 K/uL   Eosinophils Absolute 0.0  0.0 - 0.7 K/uL   Basophils Absolute 0.0  0.0 - 0.1 K/uL  BASIC METABOLIC PANEL     Status: Abnormal   Collection Time    07/24/13  3:49 PM      Result Value Range   Sodium 135  135 - 145 mEq/L   Potassium 5.5 (*) 3.5 - 5.1 mEq/L   Chloride 96  96 - 112 mEq/L   CO2 22  19 - 32 mEq/L   Glucose, Bld 138 (*) 70 - 99 mg/dL   BUN 93 (*) 6 - 23 mg/dL   Creatinine, Ser 4.09 (*) 0.50 - 1.35 mg/dL   Calcium 9.3  8.4 - 81.1 mg/dL   GFR calc non Af Amer 8 (*) >90 mL/min   GFR calc Af Amer 9 (*) >90 mL/min  PRO B NATRIURETIC PEPTIDE     Status: Abnormal   Collection Time    07/24/13  3:49 PM      Result Value Range   Pro B Natriuretic peptide (BNP) 8586.0 (*) 0 - 450 pg/mL  TROPONIN I     Status: None   Collection Time    07/24/13  3:49 PM      Result Value Range   Troponin I <0.30  <0.30 ng/mL  URINALYSIS, ROUTINE W REFLEX MICROSCOPIC     Status: Abnormal   Collection Time    07/24/13  4:05 PM      Result Value Range   Color, Urine BROWN (*) YELLOW   APPearance TURBID (*) CLEAR   Specific Gravity, Urine 1.017  1.005 - 1.030   pH 5.0  5.0 - 8.0   Glucose, UA NEGATIVE  NEGATIVE mg/dL   Hgb urine dipstick LARGE (*) NEGATIVE   Bilirubin Urine SMALL (*) NEGATIVE   Ketones, ur 15 (*) NEGATIVE mg/dL   Protein, ur 914 (*) NEGATIVE mg/dL   Urobilinogen, UA 1.0  0.0 - 1.0 mg/dL   Nitrite POSITIVE (*) NEGATIVE   Leukocytes, UA LARGE (*) NEGATIVE  URINE MICROSCOPIC-ADD ON     Status: Abnormal   Collection Time    07/24/13  4:05 PM      Result  Value Range   Squamous Epithelial / LPF FEW (*) RARE   WBC, UA TOO NUMEROUS TO COUNT  <3 WBC/hpf   RBC / HPF 21-50  <3 RBC/hpf   Bacteria, UA MANY (*) RARE   Urine-Other MUCOUS PRESENT     Dg Chest Port 1 View  07/24/2013   IMPRESSION: Unchanged cardiomegaly and mild pulmonary vascular congestion. No evidence of pneumonia or overt pulmonary edema.      Glori Luis, MD 07/24/2013, 10:33 PM PGY-2, [facility] FPTS Intern pager: [phone], text pages welcome

## 2013-07-24 NOTE — ED Notes (Signed)
Daughter: Monico Hoar [phone]

## 2013-07-25 ENCOUNTER — Telehealth: Payer: Self-pay | Admitting: Cardiology

## 2013-07-25 ENCOUNTER — Inpatient Hospital Stay (HOSPITAL_COMMUNITY): Payer: Medicare Other

## 2013-07-25 ENCOUNTER — Encounter (HOSPITAL_COMMUNITY): Payer: Self-pay

## 2013-07-25 DIAGNOSIS — E039 Hypothyroidism, unspecified: Secondary | ICD-10-CM

## 2013-07-25 DIAGNOSIS — R339 Retention of urine, unspecified: Secondary | ICD-10-CM

## 2013-07-25 DIAGNOSIS — I951 Orthostatic hypotension: Secondary | ICD-10-CM

## 2013-07-25 DIAGNOSIS — N179 Acute kidney failure, unspecified: Secondary | ICD-10-CM | POA: Diagnosis present

## 2013-07-25 DIAGNOSIS — Z950 Presence of cardiac pacemaker: Secondary | ICD-10-CM

## 2013-07-25 DIAGNOSIS — L89109 Pressure ulcer of unspecified part of back, unspecified stage: Secondary | ICD-10-CM

## 2013-07-25 DIAGNOSIS — N390 Urinary tract infection, site not specified: Secondary | ICD-10-CM

## 2013-07-25 DIAGNOSIS — G7000 Myasthenia gravis without (acute) exacerbation: Secondary | ICD-10-CM

## 2013-07-25 DIAGNOSIS — L8993 Pressure ulcer of unspecified site, stage 3: Secondary | ICD-10-CM

## 2013-07-25 LAB — STREP PNEUMONIAE URINARY ANTIGEN: Strep Pneumo Urinary Antigen: NEGATIVE

## 2013-07-25 LAB — BASIC METABOLIC PANEL
Calcium: 9.3 mg/dL (ref 8.4–10.5)
Chloride: 100 mEq/L (ref 96–112)
Creatinine, Ser: 4.36 mg/dL — ABNORMAL HIGH (ref 0.50–1.35)
GFR calc Af Amer: 13 mL/min — ABNORMAL LOW (ref >90)
Potassium: 5.4 mEq/L — ABNORMAL HIGH (ref 3.5–5.1)
Sodium: 139 mEq/L (ref 135–145)

## 2013-07-25 LAB — BLOOD GAS, ARTERIAL
Bicarbonate: 22.4 mEq/L (ref 20.0–24.0)
Patient temperature: 98.6
pH, Arterial: 7.437 (ref 7.350–7.450)
pO2, Arterial: 320 mmHg — ABNORMAL HIGH (ref 80.0–100.0)

## 2013-07-25 LAB — CBC
HCT: 38.3 % — ABNORMAL LOW (ref 39.0–52.0)
Hemoglobin: 13 g/dL (ref 13.0–17.0)
Platelets: 291 10*3/uL (ref 150–400)
RDW: 14.5 % (ref 11.5–15.5)
WBC: 13.4 10*3/uL — ABNORMAL HIGH (ref 4.0–10.5)

## 2013-07-25 LAB — URINE CULTURE

## 2013-07-25 LAB — DIGOXIN LEVEL: Digoxin Level: 1.2 ng/mL (ref 0.8–2.0)

## 2013-07-25 MED ORDER — VANCOMYCIN HCL IN DEXTROSE 1-5 GM/200ML-% IV SOLN
1000.0000 mg | INTRAVENOUS | Status: DC
  Filled 2013-07-25 (×2): qty 200

## 2013-07-25 MED ORDER — ACETAMINOPHEN 325 MG PO TABS
650.0000 mg | ORAL_TABLET | Freq: Four times a day (QID) | ORAL | Status: DC | PRN
  Administered 2013-07-25 – 2013-07-30 (×8): 650 mg via ORAL
  Filled 2013-07-25 (×8): qty 2

## 2013-07-25 MED ORDER — MIDODRINE HCL 5 MG PO TABS
10.0000 mg | ORAL_TABLET | Freq: Two times a day (BID) | ORAL | Status: DC
  Administered 2013-07-25 – 2013-07-30 (×11): 10 mg via ORAL
  Filled 2013-07-25 (×13): qty 2

## 2013-07-25 MED ORDER — SODIUM POLYSTYRENE SULFONATE 15 GM/60ML PO SUSP
15.0000 g | Freq: Once | ORAL | Status: AC
  Administered 2013-07-25: 15 g via ORAL
  Filled 2013-07-25: qty 60

## 2013-07-25 MED ORDER — PIPERACILLIN-TAZOBACTAM IN DEX 2-0.25 GM/50ML IV SOLN
2.2500 g | Freq: Four times a day (QID) | INTRAVENOUS | Status: DC
  Filled 2013-07-25 (×5): qty 50

## 2013-07-25 MED ORDER — SODIUM POLYSTYRENE SULFONATE 15 GM/60ML PO SUSP
15.0000 g | Freq: Once | ORAL | Status: AC
  Administered 2013-07-25: 15 g via RECTAL
  Filled 2013-07-25: qty 60

## 2013-07-25 NOTE — Progress Notes (Signed)
Family Practice Teaching Service Interval Progress Note  Went to reevaluate the patient given his previously decreased mental status. Patient is now opening his eyes to voice and tracking well. He is oriented to person and place. He asked appropriate questions about what is going on. Though if he was not well engaged he would drift off back to sleep. He appears much improved from my exam in the ED. Will allow him to have sips with meds and continue to evaluate his mental status for further improvement.  Marikay Alar, MD Family Medicine PGY-2 Service Pager [phone]

## 2013-07-25 NOTE — Progress Notes (Signed)
Utilization Review Completed.Marco Gregory T12/17/2014

## 2013-07-25 NOTE — Progress Notes (Signed)
INITIAL NUTRITION ASSESSMENT  DOCUMENTATION CODES Per approved criteria  -Severe malnutrition in the context of chronic illness   INTERVENTION: Monitor magnesium, potassium, and phosphorus daily for at least 3 days, MD to replete as needed, as pt is at risk for refeeding syndrome given severe malnutrition. Continue oral nutrition supplements that are ordered (Ensure Pudding and Prostat) as able. Please consult RD if aggressive nutrition therapy desired.  RD to continue to follow nutrition care plan.  NUTRITION DIAGNOSIS: Inadequate oral intake related to inability to eat as evidenced by NPO status.   Goal: Diet advancement; nutrition interventions within GOC.  Monitor:  weight trends, lab trends, I/O's, diet advancement and GOC  Reason for Assessment: Malnutrition Screening Tool + Low Braden Score  79 y.o. male  Admitting Dx: obstruction of bladder outlet  ASSESSMENT: PMHx significant for myasthenia gravis, sacral decub, OSA, afib. Admitted with urinary retention and AKI. Foley placed in ED and 1200 ml urine was returned. Work-up reveals likely obstruction of bladder outlet.  WOC RN attempted to assess patient's wound this AM, however rapid response was notified 2/2 desaturations. Pt placed on NRB and breathing improved accordingly.  Pt with 20% wt loss x 2 months. This is significant for this time frame. Daughter at bedside reports that pt has not eaten anything for 1 week. She said she would try to get him to drink Ensure but he would only take "drops."   Currently NPO. Ordered for Ensure Pudding PO BID and 30 ml Prostat liquid protein PO TID - this was ordered during previous admission by RD staff.  Pt meets criteria for severe MALNUTRITION in the context of chronic illness as evidenced by 20% wt loss x 2 months and intake of < 75% x at least 1 month.  Potassium is currently elevated at 5.4.  Height: Ht Readings from Last 1 Encounters:  07/24/13 6\' 3"  (1.905 m)     Weight: Wt Readings from Last 1 Encounters:  07/24/13 169 lb 9.6 oz (76.93 kg)    Ideal Body Weight: 196 lb  % Ideal Body Weight: 86%  Wt Readings from Last 10 Encounters:  07/24/13 169 lb 9.6 oz (76.93 kg)  07/12/13 175 lb (79.379 kg)  06/26/13 202 lb 12.8 oz (91.989 kg)  06/08/13 207 lb (93.895 kg)  03/19/13 206 lb 12.8 oz (93.804 kg)  11/13/12 208 lb (94.348 kg)  10/06/12 214 lb (97.07 kg)  08/07/12 214 lb (97.07 kg)  06/30/12 209 lb (94.802 kg)  06/08/12 204 lb 1.9 oz (92.588 kg)    Usual Body Weight: 205 - 215 lb  % Usual Body Weight: 80%  BMI:  Body mass index is 21.2 kg/(m^2). Normal weight  Estimated Nutritional Needs: Kcal: 1800 - 2000 Protein: at least 110 g Fluid: 1.8 - 2 liters  Skin:  Stage III on sacrum Stage II on coccyx Stage I on R heel Skin tear of L leg Abrasion to R knee  Diet Order:    EDUCATION NEEDS: -No education needs identified at this time   Intake/Output Summary (Last 24 hours) at 07/25/13 1134 Last data filed at 07/25/13 1100  Gross per 24 hour  Intake      0 ml  Output   1100 ml  Net  -1100 ml    Last BM: 12/16  Labs:   Recent Labs Lab 07/24/13 1549 07/25/13 0520  NA 135 139  K 5.5* 5.4*  CL 96 100  CO2 22 22  BUN 93* 97*  CREATININE 5.56* 4.36*  CALCIUM  9.3 9.3  GLUCOSE 138* 147*    CBG (last 3)  No results found for this basename: GLUCAP,  in the last 72 hours  Scheduled Meds: . digoxin  125 mcg Oral Daily  . dorzolamide-timolol  1 drop Both Eyes Daily  . feeding supplement (ENSURE)  1 Container Oral BID BM  . feeding supplement (PRO-STAT SUGAR FREE 64)  30 mL Oral TID WC  . finasteride  5 mg Oral Daily  . heparin  5,000 Units Subcutaneous Q8H  . levothyroxine  150 mcg Oral QAC breakfast  . midodrine  10 mg Oral BID  . multivitamin with minerals  1 tablet Oral Daily  . pyridostigmine  60 mg Oral TID WC  . sodium chloride  3 mL Intravenous Q12H  . sodium polystyrene  15 g Rectal Once     Continuous Infusions:   Past Medical History  Diagnosis Date  . Long-term (current) use of anticoagulants   . Glaucoma   . Gout   . Hyperthyroidism   . Orthostatic hypotension   . Redundant prepuce and phimosis   . Acute cystitis   . Hypertonicity of bladder   . HTN (hypertension)   . Atelectasis   . Dysautonomia   . Pacemaker- [facility] [employer]     DOI 2012  . CHF (congestive heart failure)     Previous preserved EF.  [name] Atrial fibrillation   . Sinus node dysfunction   . Pneumonia 08/2011    after hernia OR  . Hypothyroidism   . Sleep apnea     does not use cpap ; last used ~ 2011 (03/10/12)  . Spinal stenosis of lumbar region   . Chronic kidney disease [facility]     UTI  1 MO AGO TX MEDICALLY   . Osteomyelitis of toe of right foot ~ 2009    amputated 2nd toe  . Hypothyroid     Past Surgical History  Procedure Laterality Date  . Elbow bursa surgery  04/07/2006    Right elbow septic olecranon bursitis  . Toe amputation  ~ 2009    Osteomyelitis, right foot second toe /  Right foot second ray amputation  . Cardiac catheterization  07/03/2002    EF 60-70% /  Normal left ventricular function. / Very mild trivial three-vessel coronary atherosclerosis. / There is no mitral regurgitation noted  . Tonsillectomy and adenoidectomy      "when I was a youngster"  . Appendectomy      "when I was a youngster"  . Inguinal hernia repair  ~ 2003/13    right  . Cataract extraction w/ intraocular lens  implant, bilateral  01/2005    Jarold Motto MS, RD, LDN Pager: [phone] After-hours pager: [phone]

## 2013-07-25 NOTE — Progress Notes (Signed)
07/25/2013 Rn was called to patient room at 1000. When arrive at room wound care nurse reported to primary Rn patient saturation was in the 60's. Patient was on 2 Liters Ward and saturation went in the 70's. Respiratory was call for a breathing treatment for patient, rapid response was notified to look at patient and md was notified about patient status. Patient was place on a non rebreathing and eventually saturation increase to 100%. Md arrive on unit to assess patient and orders were given for patient to have chest x-ray, blood gas and transfer to stepdown. Rn continue to monitor patient until he was transfer to 2600. [facility] RN.

## 2013-07-25 NOTE — Consult Note (Signed)
WOC wound consult note Reason for Consult: evaluation of sacral pressure ulcer. Upon my entrance to the room pt is very congested and audible wheezing heard.  Breath sounds rhonchi on the right, left is very diminished.  Pulse ox at 70% on 2L O2, I have called the bedside nurse to aid with patient care. NT at the bedside to assist, BP 100/70.  Repositioned in the bed, was able to get O2 sats to register at 90% at highest, however did not maintain.  Respiratory called to bedside to give the patient breathing tx.  Bedside nurse obtained order to increase 02 to 4L.  Continuous pulse ox ordered, RT to bedside care assumed per bedside nurse and RT. Updated daughter on my role and that I would return to assess patient once respiratory and overall status improved.  Bedside nurse has contacted rapid response nurse and teaching service to assist in further care.   WOC will follow up later today on pressure ulcer today. Dion Sibal [place] RN,CWOCN [phone]

## 2013-07-25 NOTE — Progress Notes (Signed)
Interim Progress Note  A: pt nurse called to let me know that he has lots access and wondering if we would like a PICC line, pt stable and without complaints at this time;   O:BP 101/52  Pulse 68  Temp(Src) 97.3 F (36.3 C) (Oral)  Resp 21  Ht 6\' 3"  (1.905 m)  Wt 169 lb 9.6 oz (76.93 kg)  BMI 21.20 kg/m2  SpO2 100% Gen: very elderly male, non distressed, cachectic Resp: normal work of breathing on room air, rhonchi bilaterally without wheezes   A/P: 79 year old M with numerous co-morbidities who presents with AKI due to post-renal obstruction and then developed hypoxemia this morning. Due to the hypoxemia and patchy opacities on CXR this morning, there was concern for HCAP. Therefore, IV antibiotics were ordered. He has lost all peripheral acces and has not received any anti-biotics yet. Nonetheless, he has remained afebrile and his O2 at has remained > 95% on room air for 5 hours. Therefore, the clinical evidence supporting HCAP has diminished and previous findings on X-ray could be explained by atelectasis. Also, his mental status has been improving. At this point, he is not a candidate for PICC given failing kidneys, low creat clearance, and possible need to HD in the near future. Although, given his age and functional status, he is likely a poor candidate for such intervention. The only other options for lines with be a central line, which is not requisite at this time.  - Cancel antibiotic orders for HCAP at this time - Allow him to PO since mental status improved and pt needs hydration and no IV access - Cont to trend vitals  Si Raider. Clinton Sawyer, MD, MBA 07/25/2013, 10:46 PM Family Medicine Resident, PGY-3 [phone] pager

## 2013-07-25 NOTE — Telephone Encounter (Signed)
Update  Pt has been hospitalized and in critical care.

## 2013-07-25 NOTE — Progress Notes (Signed)
FMTS Attending Note Patient seen by me in 2C unit after transfer to SDU; he is resting comfortably.  Discussed with resident team and I agree with Dr Nyra Capes assessment and plan for today.  Paula Compton, MD

## 2013-07-25 NOTE — Telephone Encounter (Signed)
Spoke with daughter and she just wanted to let  Dr. Patty Sermons know about patient. Will forward to him

## 2013-07-25 NOTE — Progress Notes (Signed)
07/25/13 patient sats has increased to 100% on a NRB. Pt is stable RT will continue to monitor

## 2013-07-25 NOTE — Progress Notes (Signed)
ANTIBIOTIC CONSULT NOTE - INITIAL  Pharmacy Consult for vancomycin/zosyn Indication: HCAP  No Known Allergies  Patient Measurements: Height: 6\' 3"  (190.5 cm) Weight: 169 lb 9.6 oz (76.93 kg) IBW/kg (Calculated) : 84.5  Vital Signs: Temp: 97.4 F (36.3 C) (12/17 0935) Temp src: Axillary (12/17 0935) BP: 100/67 mmHg (12/17 0935) Pulse Rate: 69 (12/17 0935) Intake/Output from previous day: 12/16 0701 - 12/17 0700 In: -  Out: 1100 [Urine:1100] Intake/Output from this shift:    Labs:  Recent Labs  07/24/13 1549 07/25/13 0520  WBC 13.5* 13.4*  HGB 13.1 13.0  PLT 322 291  CREATININE 5.56* 4.36*   Estimated Creatinine Clearance: 12.2 ml/min (by C-G formula based on Cr of 4.36). No results found for this basename: Rolm Gala, VANCORANDOM, GENTTROUGH, GENTPEAK, GENTRANDOM, TOBRATROUGH, TOBRAPEAK, TOBRARND, AMIKACINPEAK, AMIKACINTROU, AMIKACIN,  in the last 72 hours   Microbiology: Recent Results (from the past 720 hour(s))  URINE CULTURE     Status: None   Collection Time    07/02/13  5:39 PM      Result Value Range Status   Specimen Description URINE, CATHETERIZED   Final   Special Requests NONE   Final   Culture  Setup Time     Final   Value: 07/02/2013 19:21     Performed at [facility] Count     Final   Value: NO GROWTH     Performed at [facility]   Culture     Final   Value: NO GROWTH     Performed at [facility]   Report Status 07/03/2013 FINAL   Final  MRSA PCR SCREENING     Status: Abnormal   Collection Time    07/02/13 11:57 PM      Result Value Range Status   MRSA by PCR POSITIVE (*) NEGATIVE Final   Comment:            The GeneXpert MRSA Assay (FDA     approved for NASAL specimens     only), is one component of a     comprehensive MRSA colonization     surveillance program. It is not     intended to diagnose MRSA     infection nor to guide or     monitor treatment for     MRSA infections.   RESULT CALLED TO, READ BACK BY AND VERIFIED WITH:     HARAWAY,J RN 1610 07/03/13 MITCHELL,L    Medical History: Past Medical History  Diagnosis Date  . Long-term (current) use of anticoagulants   . Glaucoma   . Gout   . Hyperthyroidism   . Orthostatic hypotension   . Redundant prepuce and phimosis   . Acute cystitis   . Hypertonicity of bladder   . HTN (hypertension)   . Atelectasis   . Dysautonomia   . Pacemaker- [facility] [employer]     DOI 2012  . CHF (congestive heart failure)     Previous preserved EF.  [name] Atrial fibrillation   . Sinus node dysfunction   . Pneumonia 08/2011    after hernia OR  . Hypothyroidism   . Sleep apnea     does not use cpap ; last used ~ 2011 (03/10/12)  . Spinal stenosis of lumbar region   . Chronic kidney disease [facility]     UTI  1 MO AGO TX MEDICALLY   . Osteomyelitis of toe of right foot ~ 2009    amputated 2nd toe  .  Hypothyroid     Assessment: 89 YOM from nursing home admitted yesterday with AMS and AKI. Chest Xray shows hypoventilation and bibasilar opacities, to add vancomycin and zosyn empirically to cover HCAP. Pt. Is afebrile, wbc slightly elevated = 13.4. Scr still greatly elevated from baseline, but trending down, at 4.36 today. Est. crcl ~ 10-15 ml/min. Making urine, UOP ~ 0.83ml/kg/hr.  12/16 Blood x 2 >> 12/16 Urine >>  Goal of Therapy:  Vancomycin trough level 15-20 mcg/ml  Plan:  - Vancomycin 1g IV Q 48 hrs - Zosyn 2.25g IV Q 6 hrs - F/u renal function and cultures - Consider random vanc level Friday morning before giving 2nd vanc dose if renal function is not stable.   Bayard Hugger, PharmD, BCPS  Clinical Pharmacist  Pager: [phone]   07/25/2013,12:41 PM

## 2013-07-25 NOTE — Progress Notes (Signed)
Interim progress note S: Patient transferred to step down earlier 2/2 requirement of 15LNC on non rebreather, patient awakens to voice, states that he is in pain, off O2 on assessment O: BP 100/67  Pulse 69  Temp(Src) 97.3 F (36.3 C) (Axillary)  Resp 24  Ht 6\' 3"  (1.905 m)  Wt 169 lb 9.6 oz (76.93 kg)  BMI 21.20 kg/m2  SpO2 100% Resp: rhonchorous breath sounds throughout, no no evidence of increased WOB, sating 97-10% on RA Neuro: awake and oriented to self, follows commands (appears to be close to baseline?)  A/P: 79y.o M with episode of ARF of unknown etiology, now with findings of HCAP on CXR being treated with vanc/zoyn (hx of mrsa, recent hospitalization) -f/up urine cx, legionella, sputum cultures -cont off o2 as pt tolerates with low threshold to restart -SLP for diet needs(given AMS this morning), pt stating that he is thirsty -cont abx -dig level at 6 pm tonight for toxicity, will adjust pending trough -fluids KVO  Charlane Ferretti, MD Family Medicine PGY-1 Please page or call with questions

## 2013-07-25 NOTE — Progress Notes (Signed)
Called by primary RN for patient with desaturation in the 60's.  Upon my arrival, RN, MD and family at bedside.  Patient is lying in bed with NRB mask on.  Breath sounds rhonchi b/l.  Spo2 100% currently, and is responsive.  Prior to my arrival, had a breathing RX by RT.  Orders placed by MD.  Rn to call if assistance needed

## 2013-07-25 NOTE — Progress Notes (Signed)
07/25/13 patient desats to 84 on treatment with albuterol of 8L patient 84% I placed patient on NRB callin Arva Chafe and His MD.

## 2013-07-25 NOTE — Telephone Encounter (Signed)
Acute renal failure by the chart. Thanks.

## 2013-07-25 NOTE — Progress Notes (Signed)
Family Medicine Teaching Service Daily Progress Note Intern Pager: [phone]  Patient name: Marco Gregory Medical record number: [id number] Date of birth: 08-Apr-1921 Age: 79 y.o. Gender: male  Primary Care Provider: Elvina Sidle, MD Consultants: none Code Status: DNR  Pt Overview and Major Events to Date:   Assessment and Plan: Marco Gregory is a 79 y.o. male presenting with urinary retention and AKI . PMH is significant for myasthenia gravis, sacral decub, OSA, afib, HFpEF.   # AKI on CKD: patient with Cr to 5.56 with apparent baseline of 1.2. Patient with likely obstruction as a cause given palpable bladder on ED physician exam and post-obstructive diuresis after foley placed. Hx of BPH, improved to 4.36 this morning -will leave foley in place to allow for continued drainage of urine - suspect relief of bladder outlet obstruction will improve AKI  -will trend Cr on daily BMET  -will hold home lasix given elevated in Cr, monitor for fluid overload -will continue home finasteride for BPH (would contact outpt urologist for management)   # Altered mental status: S/p ativan in the ED, opening eyes to voice and tracking this morning, grunting and c/o pain otherwise not oriented  -continue to monitor this pm -if not improving will obtain CT head, UDS, alcohol level, complete cycle of troponins  -NPO until improved status  # CHF: patient with BNP to 8586, increased from 2389 3 weeks ago. Patient does not appear volume overloaded on exam without LE edema and without apparent crackles on exam. CXR without overt signs of pulmonary edema.Elevated BNP likely elevated related to AKI.  -will continue to monitor volume status with daily weights and strict I/O's (net negative 1.1L) -will hold home lasix at this time given Cr elevation - suspect he will diurese well on his own with the foley in place  -troponin negative x1   # UTI: UA turbid with positive Nitrites and large leukocytes   -s/p one dose of CTX  -Urine CX pending -obtained blood cultures prior to antibiotics given elevation in WBC from previously   # Hyperkalemia: 5.5 on admission. Suspect this is related to acute renal failure. 5.4 on repeat s/p 1 dose of PO kaylexate -will monitor on am BMET  -will give another dose of rectal kayexalate   # Paroxysmal atrial fibrillation  -Continuing home Digoxin   # LE ulcer and Sacral Decubitus ulcer (Stage 3): sacral ulcer does not appear infected at this time  -Wound care consult  -continue to monitor for infection   # Dysautonomia and hypotension  - Continuing home Midodrine   # Hypothyroidism  - Continuing home synthroid   # Myasthenia  - Will continue Mesthinon.    FEN/GI: sips with meds PPx: HSQ  Disposition: pending resolution of AKI with rehydration, further w/up of mental status and uro consult for retention   Subjective: Still altered this morning, awakening to voice   Objective: Temp:  [97.4 F (36.3 C)-98.8 F (37.1 C)] 97.5 F (36.4 C) (12/17 0505) Pulse Rate:  [60-89] 89 (12/17 0505) Resp:  [13-24] 19 (12/17 0505) BP: (92-139)/(55-70) 116/70 mmHg (12/17 0505) SpO2:  [92 %-100 %] 100 % (12/17 0505) Weight:  [169 lb 9.6 oz (76.93 kg)] 169 lb 9.6 oz (76.93 kg) (12/16 2250) Physical Exam: General: NAD, awakens to voice  HEENT: NCAT,  Dry MM, PERRL  Cardiovascular: rrr, no murmurs appreciated  Respiratory: coarse referred upper airway sounds transmitted throughout clear at the bases  Abdomen: soft, wincing with palpation of suprapubic region, non-distended  Extremities: no edema noted in LE  Skin: right buttock stage 3 decubitus ulcer non-infected appearing, LLE with bandage in place  Neuro: sleeping, will awake to vigorous stimuli, though is not oriented   Laboratory:  Recent Labs Lab 07/24/13 1549 07/25/13 0520  WBC 13.5* 13.4*  HGB 13.1 13.0  HCT 38.6* 38.3*  PLT 322 291    Recent Labs Lab 07/24/13 1549 07/25/13 0520   NA 135 139  K 5.5* 5.4*  CL 96 100  CO2 22 22  BUN 93* 97*  CREATININE 5.56* 4.36*  CALCIUM 9.3 9.3  GLUCOSE 138* 147*    U/A- WBC TNTC, many bacteria   Imaging/Diagnostic Tests: Dg Chest Port 1 View  07/24/2013 IMPRESSION: Unchanged cardiomegaly and mild pulmonary vascular congestion. No evidence of pneumonia or overt pulmonary edema   Anselm Lis, MD 07/25/2013, 7:13 AM PGY-1, [facility] Family Medicine FPTS Intern pager: [phone], text pages welcome

## 2013-07-25 NOTE — H&P (Signed)
FMTS Attending Note  I personally saw and evaluated the patient. The plan of care was discussed with the resident team. I agree with the assessment and plan as documented by the resident.   79 y/o male with PMH HFpEF, OSA, orthostatic hypotension, PAF, Hypothyroidism found to have AKI secondary to urinary retention. Please refer to resident note for HPI. Patient had over 1200 diuresis after insertion of foley. Patient unable to answer questions are currently sedated from Ativan received in the ED. Per nursing staff patient has began to showing increased awareness, now opening eyes, no family present to obtain further HPI  Vitals: reviewed Gen: Caucasian male, drowsy, opens eyes with verbal stimuli, able to state name and is aware that he is in [place] HEENT: pupils equal in size, no icterus, MMM, neck supple Cardiac: RRR, S1 and S3 present, 3/6 systolic murmur Resp: rhonchi present, breath sounds present in all lung fields Abd: soft, no tenderness, normal bowel sounds, no rebound, no guarding Ext: no edema, multiple wounds on lower extremities covered with bandages Skin: sacral decub. Not visualized however stage 3 per resident examination  Reviewed lab work and imaging.   1. AKI secondary to urinary retention - slightly improved this AM after foley catheter placed, continue BPH medications, monitor CR, consult urology 2. Encephalopathy - appears to be secondary to Ativan received in the ER and is slowly improving, no further workup planned at this time, agree with additional workup as outlined by resident if no improvement by later this morning.  3. CHF - agree with resident that does not appear acutely overloaded, hold lasix in setting of AKI, monitor I&O's and daily weights 4. UTI - agree with Rocephin until urine culture obtained 5. Hyperkalemia - due to AKI, monitor BMP 6. Sacral Decubitus Ulcer - local wound care 7. Other chronic medical issues stable.   Donnella Sham MD

## 2013-07-26 LAB — BASIC METABOLIC PANEL
BUN: 86 mg/dL — ABNORMAL HIGH (ref 6–23)
Calcium: 9.4 mg/dL (ref 8.4–10.5)
Creatinine, Ser: 2.29 mg/dL — ABNORMAL HIGH (ref 0.50–1.35)
GFR calc Af Amer: 27 mL/min — ABNORMAL LOW (ref >90)
GFR calc non Af Amer: 24 mL/min — ABNORMAL LOW (ref >90)
Glucose, Bld: 162 mg/dL — ABNORMAL HIGH (ref 70–99)

## 2013-07-26 LAB — CBC
HCT: 40.9 % (ref 39.0–52.0)
Hemoglobin: 13 g/dL (ref 13.0–17.0)
MCH: 28.9 pg (ref 26.0–34.0)
MCHC: 31.8 g/dL (ref 30.0–36.0)
Platelets: 308 10*3/uL (ref 150–400)
RDW: 14.3 % (ref 11.5–15.5)

## 2013-07-26 LAB — LEGIONELLA ANTIGEN, URINE

## 2013-07-26 MED ORDER — DIGOXIN 0.0625 MG HALF TABLET
0.0625 mg | ORAL_TABLET | Freq: Every day | ORAL | Status: DC
  Administered 2013-07-27 – 2013-07-30 (×4): 0.0625 mg via ORAL
  Filled 2013-07-26 (×4): qty 1

## 2013-07-26 MED ORDER — TRAMADOL HCL 50 MG PO TABS
25.0000 mg | ORAL_TABLET | Freq: Two times a day (BID) | ORAL | Status: DC
  Administered 2013-07-26 – 2013-07-30 (×8): 25 mg via ORAL
  Filled 2013-07-26 (×8): qty 1

## 2013-07-26 NOTE — Progress Notes (Signed)
FMTS Attending Note Patient seen and examined by me, discussed with resident team and I agree with Dr Nyra Capes assessment and plan for today.  Patient appears comfortable and attempting to eat at time of my visit.  Results of metabolic panel and urine culture noted.  Serum Cr trending downward nicely, suggesting AKI post-renal.  Plan for transfer to SNF tomorrow with foley in place until Urology follow up as per their plan.  Paula Compton, MD

## 2013-07-26 NOTE — Progress Notes (Signed)
Patient to be transferred to [id number] report called to [facility] RN.

## 2013-07-26 NOTE — Progress Notes (Signed)
Family Medicine Teaching Service Daily Progress Note Intern Pager: [phone]  Patient name: Marco Gregory Medical record number: [id number] Date of birth: 15-May-1921 Age: 79 y.o. Gender: male  Primary Care Provider: Elvina Sidle, MD Consultants: none Code Status: DNR  Pt Overview and Major Events to Date:   Assessment and Plan: Marco Gregory is a 79 y.o. male presenting with urinary retention and AKI . PMH is significant for myasthenia gravis, sacral decub, OSA, afib, HFpEF.   # AKI on CKD: patient with Cr to 5.56 with apparent baseline of 1.2. Patient with likely obstruction as a cause given palpable bladder on ED physician exam and post-obstructive diuresis after foley placed. Hx of BPH, improved to 2.29 this morning -will leave foley in place to allow for continued drainage of urine - suspect relief of bladder outlet obstruction will improve AKI  -will trend Cr on daily BMET  -will hold home lasix given elevated in Cr, monitor for fluid overload -will continue home finasteride for BPH, urology would like to keep foley in place and send to outpt apptmt in 2 weeks  # Altered mental status: S/p ativan in the ED, had episode of AMS and ARF yesterday morning requiring non-rebreather, seems to have resolved spontaneously; trops cycled, neg X3; dig level 1.2, would consider d/c this morning; this am oriented to person and place, daughter feels that he is slightly more confused, has only taken minimal PO over the last few days -continue to monitor this pm -if not improving will obtain CT head, UDS, alcohol level -nutrition following, ordered ensure pudding BID and prostat liquid protein TID  # CHF: patient with BNP to 8586, increased from 2389 3 weeks ago. Patient does not appear volume overloaded on exam. CXR without overt signs of pulmonary edema.Elevated BNP likely elevated related to AKI.  -will continue to monitor volume status with daily weights and strict I/O's  -will  hold home lasix at this time given Cr elevation - suspect he will diurese well on his own with the foley in place    # UTI: UA turbid with positive Nitrites and large leukocytes  -s/p one dose of CTX, vanc/zosyn ordered yesterday for concern for HCAP but pt lost IV access and has clinically remained stable, will hold at this time -Urine CX- no growth -obtained blood cultures prior to antibiotics given elevation in WBC from previously   # Hyperkalemia: 5.5 on admission. Suspect this is related to acute renal failure. 3.5K this morning -will monitor on am BMET  -encourage PO intake  # Paroxysmal atrial fibrillation  -Continuing home Digoxin, level 1.2 -consider d/c if AMS not improving today  # LE ulcer and Sacral Decubitus ulcer (Stage 3): sacral ulcer does not appear infected at this time  -Wound care consult  -continue to monitor for infection   # Dysautonomia and hypotension  - Continuing home Midodrine   # Hypothyroidism  - Continuing home synthroid   # Myasthenia  - Will continue Mesthinon.    FEN/GI: sips with meds PPx: HSQ  Disposition: pending resolution of AKI with rehydration, further w/up of mental status  Subjective: Appears better this morning, feels improved, hungry and ready to eat  Objective: Temp:  [97.3 F (36.3 C)-98 F (36.7 C)] 98 F (36.7 C) (12/18 0457) Pulse Rate:  [66-74] 74 (12/18 0100) Resp:  [19-27] 27 (12/18 0100) BP: (100-132)/(52-88) 132/88 mmHg (12/18 0457) SpO2:  [74 %-100 %] 100 % (12/18 0457)  Intake/Output Summary (Last 24 hours) at 07/26/13 0932 Last data  filed at 07/26/13 0457  Gross per 24 hour  Intake      0 ml  Output   1700 ml  Net  -1700 ml    Physical Exam: General: NAD, sitting up in bed  HEENT: NCAT,  Dry MM, PERRL  Cardiovascular: rrr, no murmurs appreciated  Respiratory: coarse rhonchorous breath sounds  Abdomen: soft, nt non-distended  Extremities: no edema noted in LE  Skin: right buttock stage 3 decubitus  ulcer non-infected appearing, LLE with bandage in place  Neuro: oriented to self and place, per daughter this is marked change from previous  Laboratory:  Recent Labs Lab 07/24/13 1549 07/25/13 0520  WBC 13.5* 13.4*  HGB 13.1 13.0  HCT 38.6* 38.3*  PLT 322 291    Recent Labs Lab 07/24/13 1549 07/25/13 0520  NA 135 139  K 5.5* 5.4*  CL 96 100  CO2 22 22  BUN 93* 97*  CREATININE 5.56* 4.36*  CALCIUM 9.3 9.3  GLUCOSE 138* 147*    U/A- WBC TNTC, many bacteria   Imaging/Diagnostic Tests: Dg Chest Port 1 View  07/24/2013 IMPRESSION: Unchanged cardiomegaly and mild pulmonary vascular congestion. No evidence of pneumonia or overt pulmonary edema   Anselm Lis, MD 07/26/2013, 6:49 AM PGY-1, Gastro[facility]ology [facility] FPTS Intern pager: [phone], text pages welcome

## 2013-07-26 NOTE — Progress Notes (Signed)
Clinical Social Work Department BRIEF PSYCHOSOCIAL ASSESSMENT 07/26/2013  Patient:  Marco Gregory, Marco Gregory     Account Number:  [id number]     Admit date:  07/24/2013  Clinical Social Worker:  Varney Biles  Date/Time:  07/26/2013 01:48 PM  Referred by:  Physician  Date Referred:  07/26/2013 Referred for  SNF Placement   Other Referral:   Interview type:  Family Other interview type:    PSYCHOSOCIAL DATA Living Status:  FACILITY Admitted from facility:  [facility] Level of care:  Skilled Nursing Facility Primary support name:  Miley Lindon ([phone]) Primary support relationship to patient:  CHILD, ADULT Degree of support available:   Good--pt living at [facility] with his wife prior to hospital admission, and daughter [name] is actively involved in his care.    CURRENT CONCERNS Current Concerns  Post-Acute Placement   Other Concerns:    SOCIAL WORK ASSESSMENT / PLAN CSW called pt's daughter, as pt is not oriented to time and situation per chart. Daughter [name] explained pt was at [facility] before coming to the hospital, and his wife is at [place] as well. CSW explained her role in discharge back to [facility] and will call [name] when pt is ready for discharge.   Assessment/plan status:  Psychosocial Support/Ongoing Assessment of Needs Other assessment/ plan:   Information/referral to community resources:   SNF [facility]).    PATIENT'S/FAMILY'S RESPONSE TO PLAN OF CARE: Daughter [name] receptive to CSW phone call; very friendly and understanding of CSW role. Appreciative of offer to call when pt discharges from hospital.       Maryclare Labrador, MSW, [facility] Clinical Social Worker [phone]

## 2013-07-27 ENCOUNTER — Ambulatory Visit: Payer: Medicare Other | Admitting: Cardiology

## 2013-07-27 ENCOUNTER — Encounter (HOSPITAL_COMMUNITY): Payer: Self-pay | Admitting: Physician Assistant

## 2013-07-27 ENCOUNTER — Inpatient Hospital Stay (HOSPITAL_COMMUNITY): Payer: Medicare Other

## 2013-07-27 DIAGNOSIS — I5032 Chronic diastolic (congestive) heart failure: Secondary | ICD-10-CM

## 2013-07-27 DIAGNOSIS — I4891 Unspecified atrial fibrillation: Secondary | ICD-10-CM

## 2013-07-27 DIAGNOSIS — R079 Chest pain, unspecified: Secondary | ICD-10-CM

## 2013-07-27 LAB — BASIC METABOLIC PANEL
BUN: 75 mg/dL — ABNORMAL HIGH (ref 6–23)
Calcium: 9.3 mg/dL (ref 8.4–10.5)
Creatinine, Ser: 1.41 mg/dL — ABNORMAL HIGH (ref 0.50–1.35)
GFR calc Af Amer: 49 mL/min — ABNORMAL LOW (ref >90)
GFR calc non Af Amer: 42 mL/min — ABNORMAL LOW (ref >90)
Glucose, Bld: 138 mg/dL — ABNORMAL HIGH (ref 70–99)
Potassium: 2.9 mEq/L — ABNORMAL LOW (ref 3.5–5.1)
Sodium: 148 mEq/L — ABNORMAL HIGH (ref 135–145)

## 2013-07-27 LAB — CBC
Hemoglobin: 13.2 g/dL (ref 13.0–17.0)
MCH: 30.8 pg (ref 26.0–34.0)
MCHC: 33.5 g/dL (ref 30.0–36.0)
Platelets: 305 10*3/uL (ref 150–400)
RBC: 4.28 MIL/uL (ref 4.22–5.81)
RDW: 14.4 % (ref 11.5–15.5)

## 2013-07-27 LAB — OSMOLALITY, URINE: Osmolality, Ur: 524 mOsm/kg (ref 390–1090)

## 2013-07-27 LAB — TROPONIN I: Troponin I: <0.3 ng/mL (ref <0.30)

## 2013-07-27 MED ORDER — SODIUM CHLORIDE 0.9 % IJ SOLN: 10.0000 mL | Freq: Two times a day (BID) | INTRAMUSCULAR | Status: DC

## 2013-07-27 MED ORDER — ASPIRIN 325 MG PO TABS
325.0000 mg | ORAL_TABLET | Freq: Every day | ORAL | Status: DC
  Administered 2013-07-27 – 2013-07-28 (×2): 325 mg via ORAL
  Filled 2013-07-27 (×2): qty 1

## 2013-07-27 MED ORDER — POTASSIUM CHLORIDE CRYS ER 20 MEQ PO TBCR
40.0000 meq | EXTENDED_RELEASE_TABLET | Freq: Two times a day (BID) | ORAL | Status: AC
  Administered 2013-07-27 (×2): 40 meq via ORAL
  Filled 2013-07-27 (×2): qty 2

## 2013-07-27 MED ORDER — ONDANSETRON 4 MG PO TBDP
4.0000 mg | ORAL_TABLET | Freq: Three times a day (TID) | ORAL | Status: DC | PRN
  Administered 2013-07-27: 4 mg via ORAL
  Filled 2013-07-27 (×2): qty 1

## 2013-07-27 MED ORDER — SODIUM CHLORIDE 0.9 % IJ SOLN
10.0000 mL | INTRAMUSCULAR | Status: DC | PRN
  Administered 2013-07-28: 12:00:00 20 mL
  Administered 2013-07-29 – 2013-07-30 (×2): 10 mL

## 2013-07-27 MED ORDER — VANCOMYCIN HCL IN DEXTROSE 1-5 GM/200ML-% IV SOLN
1000.0000 mg | INTRAVENOUS | Status: DC
  Administered 2013-07-28 – 2013-07-29 (×2): 1000 mg via INTRAVENOUS
  Filled 2013-07-27 (×2): qty 200

## 2013-07-27 MED ORDER — PIPERACILLIN-TAZOBACTAM 3.375 G IVPB
3.3750 g | Freq: Three times a day (TID) | INTRAVENOUS | Status: DC
  Filled 2013-07-27 (×3): qty 50

## 2013-07-27 MED ORDER — NITROGLYCERIN 0.4 MG SL SUBL
0.4000 mg | SUBLINGUAL_TABLET | SUBLINGUAL | Status: DC | PRN
  Administered 2013-07-27: 09:00:00 0.4 mg via SUBLINGUAL
  Filled 2013-07-27: qty 25

## 2013-07-27 MED ORDER — PIPERACILLIN-TAZOBACTAM 3.375 G IVPB
3.3750 g | Freq: Three times a day (TID) | INTRAVENOUS | Status: DC
  Administered 2013-07-27 – 2013-07-29 (×7): 3.375 g via INTRAVENOUS
  Filled 2013-07-27 (×10): qty 50

## 2013-07-27 MED ORDER — VANCOMYCIN HCL 10 G IV SOLR
1500.0000 mg | Freq: Once | INTRAVENOUS | Status: AC
  Administered 2013-07-27: 1500 mg via INTRAVENOUS
  Filled 2013-07-27: qty 1500

## 2013-07-27 NOTE — Progress Notes (Signed)
IV vascular care RNs to room for PICC insertion.  Antibiotic therapy to begin after blood cultures obtained.

## 2013-07-27 NOTE — Consult Note (Signed)
CARDIOLOGY CONSULT NOTE   Patient ID: Marco Gregory MRN: [id number] DOB/AGE: 1921-05-27 79 y.o.  Admit date: 07/24/2013  Primary Physician   Elvina Sidle, MD Primary Cardiologist   Dr. Patty Sermons Reason for Consultation   Chest pain   HPI: Marco Gregory is a 79 y.o. male with a history of autonomic dysfunction with orthostatic hypotension, tachy-brady syndrome s/p pacemaker implantation, orthostatic hypotension, diastolic heart failure (EF: 65-70% 03/2012 ECHO), paroxysmal atrial fibrillation on digoxin and no anticoagulation, myasthenia gravis and history of falls who was admitted on 07/24/13 to [facility] with AKI secondary to post-renal obstruction. (Cr to 5.56 with apparent baseline of 1.2)  He has had good urine output with foley placement; however his hospital stay has been further complicated by UTI and acute respritory distress, was put on a NRB mask and transferred to step down. Patient was being evaluated for transfer to SNF when he developed chest pain and cardiology was consulted.  Patient had sharp, 9/10 left sided chest pain with associated SOB and diaphoresis. It was completely relieved by 1 SL nitroglycerine. However, after NTG blood pressure dropped with systolic in the 60s. He was given of fluids and now BP 92/31.He has never had anything like this before and has no previous MIs. Cardiac cath in 2003 showed minimal 3 vessel disease. Patient denies current chest pain or SOB. He has no abdominal pain, n/v, lightheadedness or dizziness. Troponin negative x 1. Renal function improving. They are waiting on blood cultures.     Past Medical History  Diagnosis Date  . Long-term (current) use of anticoagulants   . Glaucoma   . Gout   . Hyperthyroidism   . Orthostatic hypotension   . Redundant prepuce and phimosis   . Acute cystitis   . Hypertonicity of bladder   . HTN (hypertension)   . Atelectasis   . Dysautonomia   . Pacemaker- [facility] [employer]     DOI  2012  . CHF (congestive heart failure)     Previous preserved EF.  [name] Atrial fibrillation   . Sinus node dysfunction   . Pneumonia 08/2011    after hernia OR  . Hypothyroidism   . Sleep apnea     does not use cpap ; last used ~ 2011 (03/10/12)  . Spinal stenosis of lumbar region   . Chronic kidney disease [facility]     UTI  1 MO AGO TX MEDICALLY   . Osteomyelitis of toe of right foot ~ 2009    amputated 2nd toe  . Hypothyroid      Past Surgical History  Procedure Laterality Date  . Elbow bursa surgery  04/07/2006    Right elbow septic olecranon bursitis  . Toe amputation  ~ 2009    Osteomyelitis, right foot second toe /  Right foot second ray amputation  . Cardiac catheterization  07/03/2002    EF 60-70% /  Normal left ventricular function. / Very mild trivial three-vessel coronary atherosclerosis. / There is no mitral regurgitation noted  . Tonsillectomy and adenoidectomy      "when I was a youngster"  . Appendectomy      "when I was a youngster"  . Inguinal hernia repair  ~ 2003/13    right  . Cataract extraction w/ intraocular lens  implant, bilateral  01/2005    No Known Allergies  I have reviewed the patient's current medications . aspirin  325 mg Oral Daily  . digoxin  0.0625 mg Oral Daily  .  dorzolamide-timolol  1 drop Both Eyes Daily  . feeding supplement (ENSURE)  1 Container Oral BID BM  . feeding supplement (PRO-STAT SUGAR FREE 64)  30 mL Oral TID WC  . finasteride  5 mg Oral Daily  . heparin  5,000 Units Subcutaneous Q8H  . levothyroxine  150 mcg Oral QAC breakfast  . midodrine  10 mg Oral BID WC  . multivitamin with minerals  1 tablet Oral Daily  . piperacillin-tazobactam (ZOSYN)  IV  3.375 g Intravenous Q8H  . potassium chloride  40 mEq Oral BID  . pyridostigmine  60 mg Oral TID WC  . sodium chloride  10-40 mL Intracatheter Q12H  . sodium chloride  3 mL Intravenous Q12H  . traMADol  25 mg Oral Q12H  . vancomycin  1,500 mg Intravenous Once  .  [START ON 07/28/2013] vancomycin  1,000 mg Intravenous Q24H     sodium chloride, acetaminophen, albuterol, nitroGLYCERIN, ondansetron, sodium chloride, sodium chloride  Prior to Admission medications   Medication Sig Start Date End Date Taking? Authorizing Provider  Amino Acids-Protein Hydrolys (FEEDING SUPPLEMENT, PRO-STAT SUGAR FREE 64,) LIQD Take 30 mLs by mouth 3 (three) times daily with meals. 07/11/13  Yes Beverely Low, MD  collagenase (SANTYL) ointment Apply topically daily. 07/11/13  Yes Beverely Low, MD  digoxin (LANOXIN) 0.125 MG tablet Take 1 tablet (125 mcg total) by mouth daily. 11/20/12  Yes Cassell Clement, MD  dorzolamide-timolol (COSOPT) 22.3-6.8 MG/ML ophthalmic solution Place 1 drop into both eyes daily.    Yes Historical Provider, MD  feeding supplement, ENSURE, (ENSURE) PUDG Take 1 Container by mouth 2 (two) times daily between meals. 07/11/13  Yes Beverely Low, MD  finasteride (PROSCAR) 5 MG tablet Take 5 mg by mouth daily.    Yes Historical Provider, MD  furosemide (LASIX) 20 MG tablet Take 1 tablet (20 mg total) by mouth every other day. 07/11/13  Yes Beverely Low, MD  levothyroxine (SYNTHROID, LEVOTHROID) 150 MCG tablet Take 150 mcg by mouth daily before breakfast.   Yes Historical Provider, MD  meclizine (ANTIVERT) 25 MG tablet Take 25 mg by mouth 3 (three) times daily as needed for dizziness.   Yes Historical Provider, MD  midodrine (PROAMATINE) 10 MG tablet Take 10 mg by mouth 2 (two) times daily. 03/19/13  Yes Cassell Clement, MD  Multiple Vitamin (MULTIVITAMIN WITH MINERALS) TABS Take 1 tablet by mouth daily.   Yes Historical Provider, MD  pyridostigmine (MESTINON) 60 MG tablet Take 60 mg by mouth 3 (three) times daily. Breakfast lunch and dinner 04/06/11  Yes Duke Salvia, MD  traMADol (ULTRAM) 50 MG tablet Take 1 tablet (50 mg total) by mouth every 12 (twelve) hours as needed for moderate pain. 07/13/13  Yes Kermit Balo, DO     History   Social History  . Marital  Status: Married    Spouse Name: N/A    Number of Children: 2  . Years of Education: N/A   Occupational History  . Retired    Social History Main Topics  . Smoking status: Former Smoker -- 1.00 packs/day for 25 years    Types: Cigarettes    Quit date: 08/09/1965  . Smokeless tobacco: Never Used  . Alcohol Use: No     Comment: "last alcohol ~ 2008"  . Drug Use: No  . Sexual Activity: No   Other Topics Concern  . Not on file   Social History Narrative  . No narrative on file    Family Status  Relation  Status Death Age  . Mother Deceased   . Father Deceased    Family History  Problem Relation Age of Onset  . Cancer Mother 63    GYN     ROS:  Full 14 point review of systems complete and found to be negative unless listed above.  Physical Exam: Blood pressure 92/61, pulse 78, temperature 97 F (36.1 C), temperature source Axillary, resp. rate 18, height 6\' 3"  (1.905 m), weight 159 lb 2.8 oz (72.2 kg), SpO2 100.00%.  General: NAD, lying in bed, withdrawn  HEENT: NCAT, Dry MM, PERRL  Cardiovascular: rrr, +murmur, chest wall tenderness over area of implantable pacemaker  Respiratory: coarse rhonchorous breath sounds, breath sounds throughout, no evidence of increased work of breathing   Abdomen: soft, nt non-distended  Extremities: no edema noted in LE  Skin: right buttock with pressure ulcer, LLE with bandage in place  Neuro: oriented to self and place  Labs:   Lab Results  Component Value Date   WBC 15.5* 07/27/2013   HGB 13.2 07/27/2013   HCT 39.4 07/27/2013   MCV 92.1 07/27/2013   PLT 305 07/27/2013      Recent Labs Lab 07/27/13 0525  NA 148*  K 2.9*  CL 108  CO2 28  BUN 75*  CREATININE 1.41*  CALCIUM 9.3  GLUCOSE 138*     Recent Labs  07/24/13 1549 07/25/13 1245 07/25/13 1600 07/27/13 1301  TROPONINI <0.30 <0.30 <0.30 <0.30   No results found for this basename: TROPIPOC,  in the last 72 hours Pro B Natriuretic peptide (BNP)  Date/Time  Value Range Status  07/24/2013  3:49 PM 8586.0* 0 - 450 pg/mL Final  07/02/2013  4:12 PM 2389.0* 0 - 450 pg/mL Final     Echo: Study Date: 07/03/2013 ------ LV EF: 55% Study Conclusions - Left ventricle: The cavity size was normal. Wall thickness was increased in a pattern of mild LVH. The estimated ejection fraction was 55%. Wall motion was normal; there were no regional wall motion abnormalities. Indeterminant diastolic function (atrial fibrillation). - Aortic valve: Trileaflet; moderately calcified leaflets. There was mild stenosis. Trivial regurgitation. Mean gradient: 16mm Hg (S). Peak gradient: 27mm Hg (S). - Aorta: Mildly dilated aortic root. Aortic root dimension: 39mm (ED). - Mitral valve: Mildly calcified annulus. Normal thickness leaflets . No significant regurgitation. - Left atrium: The atrium was moderately dilated. - Right ventricle: The cavity size was mildly dilated. It is not well-visualized, but I think there is a pacemaker in the RV. Systolic function was normal. - Right atrium: The atrium was moderately dilated. - Tricuspid valve: Peak RV-RA gradient:53mm Hg (S). - Pulmonary arteries: PA peak pressure: 54mm Hg (S). - Systemic veins: IVC not visualized. Impressions: - The patient was in atrial fibrillation. Normal LV size with mild LV hypertrophy. EF 55%. Mildly dilated RV with normal systolic function. There was mild aortic stenosis.    ECG:  EKG with demand pacing, underling Afib and RBBB. Largely unchanged from a previous reading in November.  Radiology:  Dg Chest Port 1 View  07/27/2013   CLINICAL DATA:  Confirm line placement  EXAM: PORTABLE CHEST - 1 VIEW  COMPARISON:  07/1913 at 9:34 a.m.  FINDINGS: New right PICC has its tip projecting at or just below the caval atrial junction.  No change in the appearance of the lungs. No pneumothorax. Left anterior chest wall sequential pacemaker is stable and well positioned.  IMPRESSION: Right PICC tip lies at  or just below the caval atrial junction.  Dg Chest Port 1 View  07/27/2013   CLINICAL DATA:  Chest pain, shortness of breath  EXAM: PORTABLE CHEST - 1 VIEW  COMPARISON:  07/25/2013  FINDINGS: There is bilateral mild interstitial thickening. There is elevation of the left diaphragm. There are trace bilateral pleural effusions. There is no pneumothorax. Stable cardiomediastinal silhouette. Dual lead cardiac pacer. Unremarkable osseous structures.  IMPRESSION: Overall findings concerning for mild pulmonary edema.      ASSESSMENT AND PLAN:    Active Problems:   Atrial fibrillation   PPM-St.Jude   Orthostasis   Myasthenia gravis   Acute renal failure   Acute kidney injury   Chest pain  ALYJAH LOVINGOOD is a 79 y.o. male with a history of autonomic dysfunction with orthostatic hypotension, tachy-brady syndrome s/p pacemaker implantation, orthostatic hypotension, diastolic heart failure (EF: 65-70% 03/2012 ECHO), paroxysmal atrial fibrillation on digoxin and no anticoagulation, myasthenia gravis and history of falls who was admitted on 07/24/13 to [facility] with AKI secondary to post-renal obstruction. He is being evaluated today for chest pain.   Chest pain: patient has typical chest pain that was relieved by NTG. Troponin neg x 1 today. EKG with demand pacing, underling Afib and RBBB. Largely unchanged from a previous reading in November 2014. Cardiac enzymes negative x 1. Patient is currently pain free -- Current plan is to monitor and treat if symptomatic. He would be a poor cath candidate with recent AKI, frail state and altered mental status.   Atrial fibrillation -- Continue Digoxin -- No anticoagulation 2/2 history of frequent falls  Orthostatic hypotension -- Continue fluids and Midodrine  Chronic diastolic heart failure: patient with BNP to 8586 which is increased from baseline. This is not reliable given current renal status -- Not volume overloaded on exam -- Continue to  monitor volume status with daily weights  -- Strict I/O's -- Continue to hold home lasix at this time given Cr elevation  -- Continue gentle rehydration   Signed: Janeece Agee 07/27/2013 3:33 PM  Pager [phone]  I have personally seen and examined this patient with Thereasa Parkin, PA-C. I agree with the assessment and plan as outlined above. Mr. Willert has many chronic issues including recent acute renal failure, mental status changes on UTI. One episode of chest pain today. Unclear if this is c/w angina. No objective evidence of ischemia. EKG unchanged from November 2014. First troponin is negative. He not having active chest pain. Would not pursue ischemic evaluation at this time given his advanced age, dementia, recent renal failure. Would avoid NTG as he was profoundly hypotensive after receiving NTG this am. Cycle cardiac markers. We will follow with you.   MCALHANY,CHRISTOPHER 3:52 PM 07/27/2013

## 2013-07-27 NOTE — Progress Notes (Signed)
OT Cancellation Note  Patient Details Name: GADDIEL CULLENS MRN: [id number] DOB: May 06, 1921   Cancelled Treatment:    Reason Eval/Treat Not Completed: Other (comment) Will defer OT to SNF. Signing off. Thank you. [facility] Trason Shifflet, OTR/L  [phone] 07/27/2013 07/27/2013, 5:30 PM

## 2013-07-27 NOTE — Progress Notes (Signed)
Patient is currently active with [facility] Care Management for chronic disease management services.  Patient is a new [facility] referral from his last admission.  We have not been able to engage him due to his recent SNF transfer.  He is anticipated to discharge to SNF.  We will engage his care in the event that he discharges to home.  His daughter is aware of how to contact us for service engagement.  Made Inpatient Case Manager aware that [facility] Care Management following. Of note, [facility] Care Management services does not replace or interfere with any services that are arranged by inpatient case management or social work.  For additional questions or referrals please contact Anibal Henderson BSN RN [facility] Liaison at [phone].

## 2013-07-27 NOTE — Progress Notes (Signed)
ANTIBIOTIC CONSULT NOTE - INITIAL  Pharmacy Consult for vancomycin and Zosyn Indication: rule out pneumonia  No Known Allergies  Patient Measurements: Height: 6\' 3"  (190.5 cm) Weight: 159 lb 2.8 oz (72.2 kg) IBW/kg (Calculated) : 84.5 Adjusted Body Weight: n/a  Vital Signs: Temp: 97 F (36.1 C) (12/19 0538) Temp src: Axillary (12/19 0538) BP: 92/61 mmHg (12/19 1011) Pulse Rate: 78 (12/19 1011) Intake/Output from previous day: 12/18 0701 - 12/19 0700 In: 350 [P.O.:350] Out: 1100 [Urine:1100] Intake/Output from this shift: Total I/O In: 120 [P.O.:120] Out: 200 [Urine:200]  Labs:  Recent Labs  07/25/13 0520 07/26/13 0817 07/27/13 0525  WBC 13.4* 10.0 15.5*  HGB 13.0 13.0 13.2  PLT 291 308 305  CREATININE 4.36* 2.29* 1.41*   Estimated Creatinine Clearance: 35.6 ml/min (by C-G formula based on Cr of 1.41). No results found for this basename: Rolm Gala, VANCORANDOM, GENTTROUGH, GENTPEAK, GENTRANDOM, TOBRATROUGH, TOBRAPEAK, TOBRARND, AMIKACINPEAK, AMIKACINTROU, AMIKACIN,  in the last 72 hours   Microbiology: Recent Results (from the past 720 hour(s))  URINE CULTURE     Status: None   Collection Time    07/02/13  5:39 PM      Result Value Range Status   Specimen Description URINE, CATHETERIZED   Final   Special Requests NONE   Final   Culture  Setup Time     Final   Value: 07/02/2013 19:21     Performed at [facility] Count     Final   Value: NO GROWTH     Performed at [facility]   Culture     Final   Value: NO GROWTH     Performed at [facility]   Report Status 07/03/2013 FINAL   Final  MRSA PCR SCREENING     Status: Abnormal   Collection Time    07/02/13 11:57 PM      Result Value Range Status   MRSA by PCR POSITIVE (*) NEGATIVE Final   Comment:            The GeneXpert MRSA Assay (FDA     approved for NASAL specimens     only), is one component of a     comprehensive MRSA colonization     surveillance  program. It is not     intended to diagnose MRSA     infection nor to guide or     monitor treatment for     MRSA infections.     RESULT CALLED TO, READ BACK BY AND VERIFIED WITH:     HARAWAY,J RN [id number] 07/03/13 MITCHELL,L  URINE CULTURE     Status: None   Collection Time    07/24/13  4:05 PM      Result Value Range Status   Specimen Description URINE, CATHETERIZED   Final   Special Requests NONE   Final   Culture  Setup Time     Final   Value: 07/24/2013 20:26     Performed at [facility] Count     Final   Value: NO GROWTH     Performed at [facility]   Culture     Final   Value: NO GROWTH     Performed at [facility]   Report Status 07/25/2013 FINAL   Final  CULTURE, BLOOD (ROUTINE X 2)     Status: None   Collection Time    07/24/13  9:57 PM      Result Value  Range Status   Specimen Description BLOOD RIGHT FOREARM   Final   Special Requests BOTTLES DRAWN AEROBIC ONLY 10CC   Final   Culture  Setup Time     Final   Value: 07/25/2013 04:22     Performed at [facility]   Culture     Final   Value:        BLOOD CULTURE RECEIVED NO GROWTH TO DATE CULTURE WILL BE HELD FOR 5 DAYS BEFORE ISSUING A FINAL NEGATIVE REPORT     Performed at [facility]   Report Status PENDING   Incomplete  CULTURE, BLOOD (ROUTINE X 2)     Status: None   Collection Time    07/24/13 10:04 PM      Result Value Range Status   Specimen Description BLOOD RIGHT HAND   Final   Special Requests BOTTLES DRAWN AEROBIC AND ANAEROBIC 10CC   Final   Culture  Setup Time     Final   Value: 07/25/2013 04:21     Performed at [facility]   Culture     Final   Value:        BLOOD CULTURE RECEIVED NO GROWTH TO DATE CULTURE WILL BE HELD FOR 5 DAYS BEFORE ISSUING A FINAL NEGATIVE REPORT     Performed at [facility]   Report Status PENDING   Incomplete    Medical History: Past Medical History  Diagnosis Date  . Long-term (current) use of  anticoagulants   . Glaucoma   . Gout   . Hyperthyroidism   . Orthostatic hypotension   . Redundant prepuce and phimosis   . Acute cystitis   . Hypertonicity of bladder   . HTN (hypertension)   . Atelectasis   . Dysautonomia   . Pacemaker- [facility] [employer]     DOI 2012  . CHF (congestive heart failure)     Previous preserved EF.  [name] Atrial fibrillation   . Sinus node dysfunction   . Pneumonia 08/2011    after hernia OR  . Hypothyroidism   . Sleep apnea     does not use cpap ; last used ~ 2011 (03/10/12)  . Spinal stenosis of lumbar region   . Chronic kidney disease [facility]     UTI  1 MO AGO TX MEDICALLY   . Osteomyelitis of toe of right foot ~ 2009    amputated 2nd toe  . Hypothyroid     Medications:  Scheduled:  . aspirin  325 mg Oral Daily  . digoxin  0.0625 mg Oral Daily  . dorzolamide-timolol  1 drop Both Eyes Daily  . feeding supplement (ENSURE)  1 Container Oral BID BM  . feeding supplement (PRO-STAT SUGAR FREE 64)  30 mL Oral TID WC  . finasteride  5 mg Oral Daily  . heparin  5,000 Units Subcutaneous Q8H  . levothyroxine  150 mcg Oral QAC breakfast  . midodrine  10 mg Oral BID WC  . multivitamin with minerals  1 tablet Oral Daily  . potassium chloride  40 mEq Oral BID  . pyridostigmine  60 mg Oral TID WC  . sodium chloride  3 mL Intravenous Q12H  . traMADol  25 mg Oral Q12H   Assessment: 79 yo M originally presented with urinary retention and found to have AKI (SCr 4.36, baseline of ~1.2) d/t likely obstructive etiology relieved with foley cath that is still in place. Pharmacy consulted for suspected pulmonary infection.  Other pertinent issues include  a LE ulcer and sacral decubitus (appears not infected per note) for which wound care is consulted.  Patient is afebrile, but WBC has risen since yesterday from 10 to 15.5. SCr has significantly improved from 4.36 upon admission to 1.41 this AM, with estimated CrCl ~36. Blood cultures from 12/17 are pending and new  cultures have been ordered to be drawn today.  Vanc 12/19>> Zosyn 12/19>>  Goal of Therapy:  Vancomycin trough level 15-20 mcg/ml Resolution of possible infection  Plan: - start Zosyn 3.375g q8h, extended infusion - Give Vanc IV 1500mg  x1 dose, followed by 1g q24h, plan to draw trough at [facility] - monitor kidney function for changes - monitor fever curve, WBC, and clinical progression  Harrold Donath E. Achilles Dunk, PharmD Clinical Pharmacist - Resident Pager: [phone] Pharmacy: [phone] 07/27/2013 11:26 AM

## 2013-07-27 NOTE — Evaluation (Signed)
Physical Therapy Evaluation Patient Details Name: Marco Gregory MRN: [id number] DOB: 1921/01/30 Today's Date: 07/27/2013 Time: 7846-9629 PT Time Calculation (min): 25 min  PT Assessment / Plan / Recommendation History of Present Illness  Marco Gregory is a 79 y.o. male with a history of autonomic dysfunction with orthostatic hypotension, tachy-brady syndrome s/p pacemaker implantation, orthostatic hypotension, diastolic heart failure (EF: 65-70% 03/2012 ECHO), paroxysmal atrial fibrillation on digoxin and no anticoagulation, myasthenia gravis and history of falls who was admitted on 07/24/13 to [facility] with AKI secondary to post-renal obstruction  Clinical Impression  Patient presents with limited tolerance to upright activity and may benefit from skilled PT in the acute setting to address issues and help decrease burden of care at next venue.      PT Assessment  Patient needs continued PT services    Follow Up Recommendations  SNF    Does the patient have the potential to tolerate intense rehabilitation    N/A  Barriers to Discharge  None      Equipment Recommendations  None recommended by PT    Recommendations for Other Services   None  Frequency Min 3X/week    Precautions / Restrictions Precautions Precautions: Fall   Pertinent Vitals/Pain No pain complaints; HR 65, SpO2 100% on 2L O2, BP 78/51 supine after sitting 30 seconds      Mobility  Bed Mobility Bed Mobility: Scooting to HOB Supine to Sit: 2: Max assist Sitting - Scoot to [place] of Bed: 3: Mod assist Sit to Supine: 3: Mod assist Scooting to HOB: 1: +2 Total assist Scooting to [facility]: Patient Percentage: 20% Details for Bed Mobility Assistance: sat up with assist for legs and trunk, then c/o not feeling well and laid back down, BP 78/51 (RN aware) Transfers Transfers: Not assessed    Exercises     PT Diagnosis: Generalized weakness  PT Problem List: Decreased strength;Decreased activity  tolerance;Decreased mobility;Cardiopulmonary status limiting activity;Decreased safety awareness PT Treatment Interventions: DME instruction;Balance training;Gait training;Functional mobility training;Patient/family education;Therapeutic activities;Therapeutic exercise     PT Goals(Current goals can be found in the care plan section) Acute Rehab PT Goals Patient Stated Goal: to feel better Time For Goal Achievement: 08/10/13 Potential to Achieve Goals: Fair  Visit Information  Last PT Received On: 07/27/13 Assistance Needed: +1 History of Present Illness: Marco Gregory is a 79 y.o. male with a history of autonomic dysfunction with orthostatic hypotension, tachy-brady syndrome s/p pacemaker implantation, orthostatic hypotension, diastolic heart failure (EF: 65-70% 03/2012 ECHO), paroxysmal atrial fibrillation on digoxin and no anticoagulation, myasthenia gravis and history of falls who was admitted on 07/24/13 to [facility] with AKI secondary to post-renal obstruction       Prior Functioning  Home Living Family/patient expects to be discharged to:: Skilled nursing facility Living Arrangements: Alone    Cognition  Cognition Arousal/Alertness: Awake/alert Behavior During Therapy: [facility] for tasks assessed/performed Overall Cognitive Status: Within Functional Limits for tasks assessed    Extremity/Trunk Assessment Lower Extremity Assessment Lower Extremity Assessment: Generalized weakness   Balance Balance Balance Assessed: No  End of Session PT - End of Session Activity Tolerance: Treatment limited secondary to medical complications (Comment) (orthostasis) Patient left: in bed;with call bell/phone within reach  GP     [facility] 07/27/2013, 5:27 PM [place], [place] [phone] 07/27/2013

## 2013-07-27 NOTE — Progress Notes (Signed)
FMTS Attending Note Patient seen and examined by me, discussed with resident team and I agree with Dr Nyra Capes assessment and plan for today.  Events of this morning reviewed with team, I saw patient shortly after his chest pain, hypotension and resolution with IVF bolus.  He is smiling and giving tangential answers to my questions. Agree with cycling Troponins, obtaining Cardiology consult as well; CXR done this morning shows a poorly defined R heart border and concern for RLL pneumonia.  Will treat for HCAP and obtain blood cultures. Follow WBC count, which is also up today. Eventual dispo is to return to [facility]. Paula Compton, MD

## 2013-07-27 NOTE — Progress Notes (Signed)
Family Medicine Teaching Service Daily Progress Note Intern Pager: [phone]  Patient name: Marco Gregory Medical record number: [id number] Date of birth: 1921/02/08 Age: 79 y.o. Gender: male  Primary Care Provider: Elvina Sidle, MD Consultants: none Code Status: DNR  Pt Overview and Major Events to Date:   Assessment and Plan: Marco Gregory is a 79 y.o. male presenting with urinary retention and AKI . PMH is significant for myasthenia gravis, sacral decub, OSA, afib, HFpEF.   #Chest pain: Sudden onset this morning described as left sided assc with some SOB? Pain on palpation to anterior chest wall but also feels that this is a separate pain. Question angina vs demand ischemia vs pacemaker dysfxn vs pulmonary pathology vs MSK. EKG with questionable RBBB? S/p nitro BP 60s/30s, O2 in place, asa ordered; CXR with questionable infiltrate -Cycle trops - ASA, O2, would hold nitro for low BPs -fluid resus -place PICC, Bcx then abx coverage as below -Pt follows with Dr. Patty Sermons, cards consult today  #New Infiltrate on CXR, questionable desats (in light of inconsistent pulse ox readings): pt continues to be afebrile but had bump in WBC count unexplained by etiology other than pulmonary -Bcx then placement of PICC then start vanc zosyn per pharmacy for coverage of possible HCAP -trend fever curve and CBCs for clinical response -plan to d/c within 48 hrs if neg bcxs  # AKI on CKD: patient with Cr to 5.56 on admission. Patient with likely obstruction as a cause given palpable bladder Hx of BPH, improved to1.41 today- suspect relief of bladder outlet obstruction will improve AKI  -will trend Cr on daily BMET  -will hold home lasix given elevated in Cr, monitor for fluid overload- questionable pulm edema on CXR -will continue home finasteride for BPH, urology would like to keep foley in place and send to outpt apptmt in 2 weeks  # Altered mental status: S/p ativan in the ED,  mental status continuing to improve how pt still confused per daughter report; has only taken minimal PO over the last few days and still uremic -continue to monitor -if acutely worsens will obtain CT head, UDS, alcohol level -nutrition following, ordered ensure pudding BID and prostat liquid protein TID -encourage nursing to feed pt if assistance required   # CHF: patient with BNP to 8586, increased from 2389 3 weeks ago. Appears Euvolemic this am   -will continue to monitor volume status with daily weights and strict I/O's- net negative 3.5L since admission -will hold home lasix at this time given Cr elevation - suspect he will diurese well on his own with the foley in place   -gentle rehydration  # UTI: UA turbid with positive Nitrites and large leukocytes  -s/p one dose of CTX, clinically stable (did have bump in WBCs to 15.5) -Urine CX- no growth -obtained blood cultures prior to antibiotics given elevation in WBC from previously - neg X48hrs, but will send again in light of HCAP  # Hypokalemia/hypernatremia: 5.5 on admission. Suspected this wass related to acute renal failure. 3.5 yesterday, 2.9 today accompanied by hypernatremia; could be 2/2 poor PO intake -will monitor on am BMET  -encourage PO intake -gentle hydration -Serum/urine osms  # Paroxysmal atrial fibrillation  -Continuing home Digoxin, level 1.2 -cut dose in half yesterday -would recheck in 1 week  # LE ulcer and Sacral Decubitus ulcer (Stage 3): sacral ulcer does not appear infected at this time  -Wound care consulted  -continue to monitor for infection   # Dysautonomia and  hypotension  - Continuing home Midodrine   # Hypothyroidism  - Continuing home synthroid   # Myasthenia  - Will continue [place].   FEN/GI: HHD,  PPx: HSQ  Disposition: pending resolution of AKI with rehydration, cardiology recommendation, tx of HCAP and transition to PO abx  Subjective: Was endorsing CP that began 20 min prior  to my arrival per pt report. However this was the first time he had mentioned it to anyone. Feels SOB (but states that he feels this way every morning) No diaphoresis. Some nausea with thought of eating. Denies palpitations   Objective: Temp:  [97 F (36.1 C)-97.8 F (36.6 C)] 97 F (36.1 C) (12/19 0538) Pulse Rate:  [42-79] 72 (12/19 0538) Resp:  [15-24] 18 (12/19 0538) BP: (102-135)/(45-99) 102/65 mmHg (12/19 0538) SpO2:  [90 %-100 %] 90 % (12/19 0538) Weight:  [158 lb 11.7 oz (72 kg)-159 lb 2.8 oz (72.2 kg)] 159 lb 2.8 oz (72.2 kg) (12/19 0538)  Intake/Output Summary (Last 24 hours) at 07/27/13 0714 Last data filed at 07/27/13 0644  Gross per 24 hour  Intake    350 ml  Output   1100 ml  Net   -750 ml    Physical Exam: General: NAD, sitting up in bed  HEENT: NCAT, Dry MM, PERRL  Cardiovascular: rrr, no murmurs appreciated, chest wall tenderness over area of  implantable pacemaker  Respiratory: coarse rhonchorous breath sounds, transmitted UAS, breath sounds throughout, no evidence of increased WOB  Abdomen: soft, nt non-distended  Extremities: no edema noted in LE  Skin: right buttock stage 3 decubitus ulcer non-infected appearing, LLE with bandage in place  Neuro: oriented to self and place  Laboratory:  Recent Labs Lab 07/25/13 0520 07/26/13 0817 07/27/13 0525  WBC 13.4* 10.0 15.5*  HGB 13.0 13.0 13.2  HCT 38.3* 40.9 39.4  PLT 291 308 305    Recent Labs Lab 07/24/13 1549 07/25/13 0520 07/26/13 0817  NA 135 139 144  K 5.5* 5.4* 3.5  CL 96 100 103  CO2 22 22 28   BUN 93* 97* 86*  CREATININE 5.56* 4.36* 2.29*  CALCIUM 9.3 9.3 9.4  GLUCOSE 138* 147* 162*    U/A- WBC TNTC, many bacteria  Ucx- neg Urine Legionella neg Strep Pneumo Antigen neg  Imaging/Diagnostic Tests: Dg Chest Port 1 View  07/24/2013 IMPRESSION: Unchanged cardiomegaly and mild pulmonary vascular congestion. No evidence of pneumonia or overt pulmonary edema   Marco Lis,  MD 07/27/2013, 7:14 AM PGY-1, [facility] Family Medicine FPTS Intern pager: [phone], text pages welcome

## 2013-07-27 NOTE — Progress Notes (Signed)
Called to assist with patient with CP and BP drop after NTG.  RRT RN delayed getting here - was in transport of patient to ICU on Bipap  - suggested to staff via phone to give NS bolus if no overt pulmonary edema present - MD had already been called.   On my arrival MD present in room - patient now pain free and BP responding to fluid bolus.  Patient alert and at baseline.  No changes on 12 lead - cardiology to see patient.  RN staff to call as needed.

## 2013-07-27 NOTE — Consult Note (Signed)
WOC attempted to reevaluate patient's sacral wound today, he is currently experiencing active chest pain and the bedside staff is in the process of obtaining EKG.  I spoke with patient and daughter and I will try to see this patient later today if possible.  No new updates or decline in the sacral ulcer per the nursing staff.   Shondrika Hoque Covelo RN,CWOCN [phone]

## 2013-07-27 NOTE — Progress Notes (Signed)
1610:  Pt c/o left-sided chest pain.  Unable to rate pain.  Can point to left side of chest at location.  B/p = 103/60, P = 85, R = 18 and )2 sat = 70% on room air (pulse ox to finger).  Dr. Michail Jewels notified and nitroglycerin protocol initiated and 12-Lead EKG obtained.  Pt placed on 02 at 6L per nasal cannula  and 0@ sat up to 88%.  B/P dropped to 61/37 and pulse 73, 10 minutes after 1st SL ntg given.  Hypotension protocol per cardiac standing orders initiated and 250 cc bolus of NS initiated to infuse over 30 minutes.  12 Lead EKG showed underlying rhythm of A Fib with BBB.  Dr. Michail Jewels informed of all above and to bedside to examine.  Portable CXR ordered and obtained.  Pt's B/P up to 113/51 and pulse up to 96 after 250 cc bolus. 02 decreased to 4L per [place] and sats staying in high 90s.  02 probe placed on pt's left ear lobe.  Rapid response nurse to room to assess and no further recommendations.

## 2013-07-27 NOTE — Progress Notes (Signed)
Peripherally Inserted Central Catheter/Midline Placement  The IV Nurse has discussed with the patient and/or persons authorized to consent for the patient, the purpose of this procedure and the potential benefits and risks involved with this procedure.  The benefits include less needle sticks, lab draws from the catheter and patient may be discharged home with the catheter.  Risks include, but not limited to, infection, bleeding, blood clot (thrombus formation), and puncture of an artery; nerve damage and irregular heat beat.  Alternatives to this procedure were also discussed.  PICC/Midline Placement Documentation  PICC / Midline Single Lumen 07/27/13 PICC Right Basilic 47 cm 1 cm (Active)  Indication for Insertion or Continuance of Line Poor Vasculature-patient has had multiple peripheral attempts or PIVs lasting less than 24 hours 07/27/2013  1:06 PM  Exposed Catheter (cm) 1 cm 07/27/2013  1:06 PM  Site Assessment Clean;Dry;Intact 07/27/2013  1:06 PM  Line Status Flushed;Saline locked;Blood return noted 07/27/2013  1:06 PM  Dressing Type Transparent 07/27/2013  1:06 PM  Dressing Status Clean;Dry;Intact 07/27/2013  1:06 PM  Dressing Intervention New dressing 07/27/2013  1:06 PM  Dressing Change Due 08/03/13 07/27/2013  1:06 PM       Ethelda Chick 07/27/2013, 1:08 PM

## 2013-07-28 LAB — BASIC METABOLIC PANEL
BUN: 67 mg/dL — ABNORMAL HIGH (ref 6–23)
CO2: 29 mEq/L (ref 19–32)
Calcium: 9 mg/dL (ref 8.4–10.5)
Chloride: 110 mEq/L (ref 96–112)
Creatinine, Ser: 1.18 mg/dL (ref 0.50–1.35)
GFR calc non Af Amer: 52 mL/min — ABNORMAL LOW (ref >90)
Glucose, Bld: 179 mg/dL — ABNORMAL HIGH (ref 70–99)

## 2013-07-28 LAB — CBC
HCT: 37.9 % — ABNORMAL LOW (ref 39.0–52.0)
Hemoglobin: 12.4 g/dL — ABNORMAL LOW (ref 13.0–17.0)
MCH: 30.5 pg (ref 26.0–34.0)
MCHC: 32.7 g/dL (ref 30.0–36.0)
MCV: 93.3 fL (ref 78.0–100.0)
Platelets: 265 10*3/uL (ref 150–400)
RDW: 14.6 % (ref 11.5–15.5)
WBC: 14.1 10*3/uL — ABNORMAL HIGH (ref 4.0–10.5)

## 2013-07-28 MED ORDER — ASPIRIN EC 81 MG PO TBEC
81.0000 mg | DELAYED_RELEASE_TABLET | Freq: Every day | ORAL | Status: DC
  Administered 2013-07-29 – 2013-07-30 (×2): 81 mg via ORAL
  Filled 2013-07-28 (×2): qty 1

## 2013-07-28 NOTE — Progress Notes (Signed)
Family Medicine Teaching Service Daily Progress Note Intern Pager: [phone]  Patient name: Marco Gregory Medical record number: [id number] Date of birth: 1921/07/23 Age: 79 y.o. Gender: male  Primary Care Provider: Elvina Sidle, MD Consultants: none Code Status: DNR  Pt Overview and Major Events to Date:   Assessment and Plan: Marco Gregory is a 79 y.o. male presenting with urinary retention and AKI . PMH is significant for myasthenia gravis, sacral decub, OSA, afib, HFpEF.   #Chest pain: Sudden onset yesterday, resolved with 1 of NG but dropped BP so would hold; EKG unchanged, trops neg; prior echo 07/03/13 with EF of 55%; not endorsing pain this morning  -cards consulted and is following - ASA, would hold nitro for low BPs -no acute interventions  #New Infiltrate on CXR, questionable desats (in light of inconsistent pulse ox readings): pt continues to be afebrile but had bump in WBC count unexplained by etiology other than pulmonary -Bcx obtained yesterday, cont vanc zosyn per pharmacy for coverage of possible HCAP -trend fever curve and CBCs for clinical response -plan to d/c within 48 hrs if neg bcxs  # AKI on CKD: patient with Cr to 5.56 on admission. Patient with likely obstruction as a cause given palpable bladder Hx of BPH, improved to1.41- suspect relief of bladder outlet obstruction will improve AKI  -will trend Cr on daily BMET  -will hold home lasix given elevated in Cr, monitor for fluid overload- questionable pulm edema on CXR -will continue home finasteride for BPH, urology would like to keep foley in place and send to outpt apptmt in 2 weeks  # Altered mental status: S/p ativan in the ED, MS continuing to improve  -continue to monitor -if acutely worsens will obtain CT head, UDS, alcohol level -nutrition following, ordered ensure pudding BID and prostat liquid protein TID -encourage nursing to feed pt if assistance required   # CHF: patient with  BNP to 8586, increased from 2389 3 weeks ago. Appears Euvolemic this am   -will continue to monitor volume status with daily weights and strict I/O's- net negative 3.5L since admission -will hold home lasix at this time given Cr elevation - suspect he will diurese well on his own with the foley in place   -gentle rehydration  # UTI: UA turbid with positive Nitrites and large leukocytes  -s/p one dose of CTX, clinically stable (did have bump in WBCs to 15.5) -Urine CX- no growth -obtained blood cultures prior to antibiotics given elevation in WBC from previously - neg X48hrs, but will send again in light of HCAP  # Hypokalemia/hypernatremia: 5.5 on admission. Suspected this wass related to acute renal failure. 3.5 yesterday, 2.9 yesterday; serum osm indicating dehydration likely 2/2 poor PO intake -will monitor on am BMET  -encourage PO intake -gentle hydration  # Paroxysmal atrial fibrillation  -Continuing home Digoxin, level 1.2 -would recheck in 1 week  # LE ulcer and Sacral Decubitus ulcer (Stage 3): sacral ulcer does not appear infected at this time  -Wound care consulted  -continue to monitor for infection   # Dysautonomia and hypotension  - Continuing home Midodrine   # Hypothyroidism  - Continuing home synthroid   # Myasthenia  - Will continue Mesthinon.   FEN/GI: HHD,  PPx: HSQ  Disposition: pending resolution of AKI with rehydration, cardiology recommendation, tx of HCAP and transition to PO abx  Subjective: Feeling better this morning; was not endorsing any chest pain, getting ready to eat breakfast  Objective: Temp:  Regin.Schirmer  F (36.1 C)-98.3 F (36.8 C)] 97.6 F (36.4 C) (12/20 0459) Pulse Rate:  [64-85] 66 (12/20 0459) Resp:  [18] 18 (12/20 0459) BP: (61-144)/(37-68) 92/64 mmHg (12/20 0459) SpO2:  [74 %-100 %] 96 % (12/20 0459) Weight:  [148 lb 2.4 oz (67.2 kg)] 148 lb 2.4 oz (67.2 kg) (12/20 0459)  Intake/Output Summary (Last 24 hours) at 07/28/13 0740 Last  data filed at 07/28/13 0617  Gross per 24 hour  Intake   1220 ml  Output    810 ml  Net    410 ml    Physical Exam: General: NAD, sitting up in bed  HEENT: NCAT, Dry MM, PERRL  Cardiovascular: rrr, no murmurs appreciated, Respiratory: coarse rhonchorous breath sounds,rales on L>R lung field no evidence of increased WOB  Abdomen: soft, nt non-distended  Extremities: no edema noted in LE  Skin: right buttock stage 3 decubitus ulcer non-infected appearing, LLE with bandage in place  Neuro: oriented to self and place  Laboratory:  Recent Labs Lab 07/25/13 0520 07/26/13 0817 07/27/13 0525  WBC 13.4* 10.0 15.5*  HGB 13.0 13.0 13.2  HCT 38.3* 40.9 39.4  PLT 291 308 305    Recent Labs Lab 07/25/13 0520 07/26/13 0817 07/27/13 0525  NA 139 144 148*  K 5.4* 3.5 2.9*  CL 100 103 108  CO2 22 28 28   BUN 97* 86* 75*  CREATININE 4.36* 2.29* 1.41*  CALCIUM 9.3 9.4 9.3  GLUCOSE 147* 162* 138*    U/A- WBC TNTC, many bacteria  Ucx- neg Urine Legionella neg Strep Pneumo Antigen neg  Imaging/Diagnostic Tests: Dg Chest Port 1 View  07/24/2013 IMPRESSION: Unchanged cardiomegaly and mild pulmonary vascular congestion. No evidence of pneumonia or overt pulmonary edema   Anselm Lis, MD 07/28/2013, 7:40 AM PGY-1, [facility] Family Medicine FPTS Intern pager: [phone], text pages welcome

## 2013-07-28 NOTE — Progress Notes (Signed)
FMTS Attending Note PAtient seen and examined by me today, discussed with Dr Michail Jewels and I agree with her assessment and plan for today.  [name] appears comfortable, not endorsing any pain today.  He does have rales audible more on the L base than on the R; agree with continuation of treatment for suspected HCAP and follow WBC count, blood cultures.  If negative cultures tomorrow and continued clinical improvement, then change to oral abx (levaquin). Paula Compton, MD

## 2013-07-28 NOTE — Progress Notes (Signed)
Patient ID: Marco Gregory, male   DOB: 1920-11-28, 79 y.o.   MRN: [id number]    Subjective:    Patient denies any chest pain this morning.   Objective:   Temp:  [97 F (36.1 C)-98.3 F (36.8 C)] 97.6 F (36.4 C) (12/20 0459) Pulse Rate:  [64-78] 66 (12/20 0459) Resp:  [18] 18 (12/20 0459) BP: (78-144)/(51-68) 92/64 mmHg (12/20 0459) SpO2:  [96 %-100 %] 96 % (12/20 0459) Weight:  [148 lb 2.4 oz (67.2 kg)] 148 lb 2.4 oz (67.2 kg) (12/20 0459) Last BM Date: 07/27/13  Filed Weights   07/26/13 2215 07/27/13 0538 07/28/13 0459  Weight: 158 lb 11.7 oz (72 kg) 159 lb 2.8 oz (72.2 kg) 148 lb 2.4 oz (67.2 kg)    Intake/Output Summary (Last 24 hours) at 07/28/13 0953 Last data filed at 07/28/13 0617  Gross per 24 hour  Intake   1100 ml  Output    610 ml  Net    490 ml    Telemetry: V-paced, afib at times  Exam:  General:NAD  Resp:clear anteriorly  Cardiac: irreg, no JVD  MW:UXLK, NT, ND  GMW:NUUVOZDGUYQ warm, no edema  Neuro: no focal deficits    Lab Results:  Basic Metabolic Panel:  Recent Labs Lab 07/25/13 0520 07/26/13 0817 07/27/13 0525  NA 139 144 148*  K 5.4* 3.5 2.9*  CL 100 103 108  CO2 22 28 28   GLUCOSE 147* 162* 138*  BUN 97* 86* 75*  CREATININE 4.36* 2.29* 1.41*  CALCIUM 9.3 9.4 9.3    Liver Function Tests: No results found for this basename: AST, ALT, ALKPHOS, BILITOT, PROT, ALBUMIN,  in the last 168 hours  CBC:  Recent Labs Lab 07/25/13 0520 07/26/13 0817 07/27/13 0525  WBC 13.4* 10.0 15.5*  HGB 13.0 13.0 13.2  HCT 38.3* 40.9 39.4  MCV 90.5 90.9 92.1  PLT 291 308 305    Cardiac Enzymes:  Recent Labs Lab 07/25/13 1600 07/27/13 1301 07/28/13 0040  TROPONINI <0.30 <0.30 <0.30    BNP:  Recent Labs  07/02/13 1612 07/24/13 1549  PROBNP 2389.0* 8586.0*    Coagulation: No results found for this basename: INR,  in the last 168 hours  ECG:   Medications:   Scheduled Medications: . aspirin  325 mg Oral  Daily  . digoxin  0.0625 mg Oral Daily  . dorzolamide-timolol  1 drop Both Eyes Daily  . feeding supplement (ENSURE)  1 Container Oral BID BM  . feeding supplement (PRO-STAT SUGAR FREE 64)  30 mL Oral TID WC  . finasteride  5 mg Oral Daily  . heparin  5,000 Units Subcutaneous Q8H  . levothyroxine  150 mcg Oral QAC breakfast  . midodrine  10 mg Oral BID WC  . multivitamin with minerals  1 tablet Oral Daily  . piperacillin-tazobactam (ZOSYN)  IV  3.375 g Intravenous Q8H  . pyridostigmine  60 mg Oral TID WC  . sodium chloride  10-40 mL Intracatheter Q12H  . sodium chloride  3 mL Intravenous Q12H  . traMADol  25 mg Oral Q12H  . vancomycin  1,000 mg Intravenous Q24H     Infusions:     PRN Medications:  sodium chloride, acetaminophen, albuterol, nitroGLYCERIN, ondansetron, sodium chloride, sodium chloride     Assessment/Plan   79 yo male history of autonomic dysfunction with orthostatic hypotension, myasthenia gravis, tachy-brady syndrome with pacemaker, diastolic heart falure, DNR status and parox afib admitted with AKI with postrenal obstruction. Cardiology consulted during admission after patient  developed chest pain.  1. Chest pain - sharp 9/10 pain with associ SOB and diaphoresis. Resolved with NG x 1, which dropped his blood pressures to systolics in the 60s. BP responsed to IVF.  - EKG from 12/17 shows V-paced rhythm, troponin negative x 2 - prior echo 07/03/13 LVEF 55%, indeterminant diastolic function.  - no recurrent episodes of chest pain, no evidence of ACS - patient would be poor candidate for any form of invasive procedures given multiple comorbidities, age, functional status, infection, and kidney injury. Continue conservative management, pending recovery from this admission could potenially consider non-invasive testing as outpatient  2. Afib - on digoxin, he has not been anticoagulated chronically because of recurrent falls - rates controlled, has intermittend  pacing  3. Chronic diastolic heart failure - elevated BNP likely due to urinary retention, currently does not appear significant volume overloaded - continue to follow volume status.    Dina Rich, M.D., F.A.C.C.

## 2013-07-29 LAB — CBC WITH DIFFERENTIAL/PLATELET
Basophils Absolute: 0 10*3/uL (ref 0.0–0.1)
Basophils Relative: 0 % (ref 0–1)
HCT: 37.8 % — ABNORMAL LOW (ref 39.0–52.0)
Hemoglobin: 12 g/dL — ABNORMAL LOW (ref 13.0–17.0)
Lymphs Abs: 1.3 10*3/uL (ref 0.7–4.0)
MCHC: 31.7 g/dL (ref 30.0–36.0)
Monocytes Absolute: 0.8 10*3/uL (ref 0.1–1.0)
Neutro Abs: 10.8 10*3/uL — ABNORMAL HIGH (ref 1.7–7.7)
Neutrophils Relative %: 84 % — ABNORMAL HIGH (ref 43–77)
Platelets: 258 10*3/uL (ref 150–400)
RDW: 15 % (ref 11.5–15.5)

## 2013-07-29 LAB — BASIC METABOLIC PANEL
BUN: 62 mg/dL — ABNORMAL HIGH (ref 6–23)
Calcium: 8.8 mg/dL (ref 8.4–10.5)
Chloride: 109 mEq/L (ref 96–112)
Creatinine, Ser: 1.16 mg/dL (ref 0.50–1.35)
GFR calc Af Amer: 62 mL/min — ABNORMAL LOW (ref >90)
GFR calc non Af Amer: 54 mL/min — ABNORMAL LOW (ref >90)

## 2013-07-29 MED ORDER — LEVOFLOXACIN 750 MG PO TABS
750.0000 mg | ORAL_TABLET | ORAL | Status: DC
  Administered 2013-07-29: 750 mg via ORAL
  Filled 2013-07-29: qty 1

## 2013-07-29 NOTE — Progress Notes (Signed)
Patient ID: Marco Gregory, male   DOB: 05/29/1921, 79 y.o.   MRN: [id number]    Primary cardiologist:  Subjective:   No chest pain. No complaints this morning.    Objective:   Temp:  [97.3 F (36.3 C)-97.4 F (36.3 C)] 97.3 F (36.3 C) (12/21 0543) Pulse Rate:  [63-82] 70 (12/21 0543) Resp:  [16-18] 18 (12/21 0543) BP: (103-126)/(57-91) 123/64 mmHg (12/21 0543) SpO2:  [95 %-100 %] 97 % (12/21 0543) Weight:  [144 lb 10 oz (65.6 kg)] 144 lb 10 oz (65.6 kg) (12/21 0543) Last BM Date: 07/27/13  Filed Weights   07/27/13 0538 07/28/13 0459 07/29/13 0543  Weight: 159 lb 2.8 oz (72.2 kg) 148 lb 2.4 oz (67.2 kg) 144 lb 10 oz (65.6 kg)    Intake/Output Summary (Last 24 hours) at 07/29/13 0917 Last data filed at 07/29/13 0801  Gross per 24 hour  Intake    880 ml  Output   1225 ml  Net   -345 ml    Telemetry:no events  Exam:  General:NAD  Resp: clear anteriorly  Cardiac:irreg, rate 85, 2/6 systolic murmur RUSB  WU:JWJX, NT, ND  MSK: extremities are warm, no edema  Neuro: no focal deficits    Lab Results:  Basic Metabolic Panel:  Recent Labs Lab 07/27/13 0525 07/28/13 1130 07/29/13 0441  NA 148* 147* 146*  K 2.9* 4.3 4.3  CL 108 110 109  CO2 28 29 30   GLUCOSE 138* 179* 151*  BUN 75* 67* 62*  CREATININE 1.41* 1.18 1.16  CALCIUM 9.3 9.0 8.8    Liver Function Tests: No results found for this basename: AST, ALT, ALKPHOS, BILITOT, PROT, ALBUMIN,  in the last 168 hours  CBC:  Recent Labs Lab 07/27/13 0525 07/28/13 1130 07/29/13 0441  WBC 15.5* 14.1* 13.0*  HGB 13.2 12.4* 12.0*  HCT 39.4 37.9* 37.8*  MCV 92.1 93.3 95.2  PLT 305 265 258    Cardiac Enzymes:  Recent Labs Lab 07/25/13 1600 07/27/13 1301 07/28/13 0040  TROPONINI <0.30 <0.30 <0.30    BNP:  Recent Labs  07/02/13 1612 07/24/13 1549  PROBNP 2389.0* 8586.0*    Coagulation: No results found for this basename: INR,  in the last 168 hours  ECG:   Medications:     Scheduled Medications: . aspirin EC  81 mg Oral Daily  . digoxin  0.0625 mg Oral Daily  . dorzolamide-timolol  1 drop Both Eyes Daily  . feeding supplement (ENSURE)  1 Container Oral BID BM  . feeding supplement (PRO-STAT SUGAR FREE 64)  30 mL Oral TID WC  . finasteride  5 mg Oral Daily  . heparin  5,000 Units Subcutaneous Q8H  . levothyroxine  150 mcg Oral QAC breakfast  . midodrine  10 mg Oral BID WC  . multivitamin with minerals  1 tablet Oral Daily  . piperacillin-tazobactam (ZOSYN)  IV  3.375 g Intravenous Q8H  . pyridostigmine  60 mg Oral TID WC  . sodium chloride  10-40 mL Intracatheter Q12H  . sodium chloride  3 mL Intravenous Q12H  . traMADol  25 mg Oral Q12H  . vancomycin  1,000 mg Intravenous Q24H     Infusions:     PRN Medications:  sodium chloride, acetaminophen, albuterol, nitroGLYCERIN, ondansetron, sodium chloride, sodium chloride     Assessment/Plan    79 yo male history of autonomic dysfunction with orthostatic hypotension, myasthenia gravis, tachy-brady syndrome with pacemaker, diastolic heart falure, DNR status and parox afib admitted with AKI with  postrenal obstruction. Cardiology consulted during admission after patient developed chest pain.   1. Chest pain  - sharp 9/10 pain with associ SOB and diaphoresis. Resolved with NG x 1, which dropped his blood pressures to systolics in the 60s. BP responsed to IVF.  - EKG from 12/17 shows V-paced rhythm, troponin negative x 2  - prior echo 07/03/13 LVEF 55%, indeterminant diastolic function.  - no recurrent episodes of chest pain, no evidence of ACS  - patient would be poor candidate for any form of invasive procedures given multiple comorbidities, age, functional status, infection, and kidney injury. Continue conservative management, pending recovery from this admission could potenially consider non-invasive testing as outpatient   2. Afib  - on digoxin, he has not been anticoagulated chronically because  of recurrent falls  - rates controlled, has intermittent pacing   3. Chronic diastolic heart failure  - elevated BNP likely due to urinary retention, currently does not appear significant volume overloaded  - continue to follow volume status.    Cardiology will sign off of inpatient care. Please contact us again if needed. Patient may follow up with Dr Patty Sermons as an outpatient.      Dina Rich, M.D., F.A.C.C.

## 2013-07-29 NOTE — Progress Notes (Signed)
FMTS Attending Note Patient seen and examined by me, discussed with resident team and I agree with Dr Purvis Sheffield assessment and plan.  Patient appears more alert this morning, albeit pleasantly confused.  Continues with basilar rales that are more noticeable on the L than R.  Agree with plan to transition to oral abx (Levaquin) for presumed HCAP that was thought to be the reason for his apparent respiratory distress and oxygen desats; responded well once on abx.  Urinary retention-- to be discharged with foley in place, for outpatient Urology follow up. Patient resides at [facility] and can likely return there tomorrow.  Paula Compton, MD

## 2013-07-29 NOTE — Plan of Care (Signed)
Problem: Phase I Progression Outcomes Goal: OOB as tolerated unless otherwise ordered Outcome: Not Met (add Reason) Pt extremely weak.

## 2013-07-29 NOTE — Clinical Social Work Note (Signed)
Per CSW handoff patient is from [facility]. CSW has faxed patient's information to [facility]. CSW will continue to follow.  Roddie Mc, [place], [place], [phone]

## 2013-07-29 NOTE — Discharge Summary (Signed)
Family Medicine Teaching [facility] Discharge Summary  Patient name: Marco Gregory Medical record number: [id number] Date of birth: 1920/08/23 Age: 79 y.o. Gender: male Date of Admission: 07/24/2013  Date of Discharge:07/30/13 Admitting Physician: Uvaldo Rising, MD  Primary Care Provider: Elvina Sidle, MD Consultants: none  Indication for Hospitalization: SOB, general malaise  Discharge Diagnoses/Problem List:  Patient Active Problem List   Diagnosis Date Noted  . Chest pain   . Acute kidney injury 07/25/2013  . Acute renal failure 07/24/2013  . Hypothyroid   . Decubitus ulcer of sacral region, stage 3 07/03/2013  . Chronic diastolic heart failure 04/19/2012  . Myasthenia gravis 04/19/2012  . COPD exacerbation 03/10/2012  . Overactive bladder 04/05/2011  . DYSAUTONOMIA 12/03/2008  . OBSTRUCTIVE SLEEP APNEA 11/24/2007    Disposition: SNF, [facility]  Discharge Condition: improved  Discharge Exam: BP 124/81  Pulse 64  Temp(Src) 97.6 F (36.4 C) (Axillary)  Resp 18  Ht 6\' 3"  (1.905 m)  Wt 143 lb (64.864 kg)  BMI 17.87 kg/m2  SpO2 96% General: NAD, sitting up in bed  HEENT: NCAT  Cardiovascular: rrr, no murmurs appreciated,  Respiratory: improved breath sounds with apparently fewer rhonchi, no evidence of increased WOB  Abdomen: soft, nt non-distended  Extremities: no edema noted in LE  Neuro: alert, no focal deficits  Brief Hospital Course:  Marco Gregory is a 79 y.o. male presenting with urinary retention and AKI . PMH is significant for myasthenia gravis, sacral decub, OSA, afib, HFpEF.   #Chest pain: Sudden onset left sided but no n/v//diaphoresis during admission, resolved with 1 of NG but dropped BP 60s/30s so was not given additional nitros. Ekg obtained at that time unchanged, trops neg; prior echo 07/03/13 with EF of 55%; Cards consulted and felt that there was nothing else to do. Pt cont'd on ASA for prevention.   #New Infiltrate on  CXR, questionable desats Pt was presenting with "rattly" breathing on route to pacemaker check outpatient. However pt was sating well on RA on admission with no evidence of SOB. But subsequently had an episode of questionable desating (in light of inconsistent pulse ox readings, does not trace from patient's finger). Was placed on non rebreather and transferred to step down for closer monitoring. O2 then transitioned over to [place] and on repeat check pt was found to be sating well on RA. CXR obtained at that time showed questionable HCAP and pt was noted to have mild bump in WBC unexplained from any other etiology. BCx drawn, started on vanc/zosyn. Found to be neg for 48hrs. Transitioned to PO levaquin. Tolerated well on day of d/c with no fevers or worsening resp status. D/c'd to complete additional 5days of abx.  # AKI on CKD: Patient with Cr to 5.56 on admission. Patient with likely obstruction as a cause given palpable bladder Hx of BPH, improved to1.41 with relief of bladder outlet obstruction. Held home lasix given elevated Cr, monitored for fluid overload, appeared euvolemic. No evidence of pulm edema of CXR. Cont'd home finasteride for BPH. Consulted urology on phone, will keep foley in place and sent to an outpt apptmt in 2 weeks.   # Altered mental status: S/p ativan in the ED, MS continued to improved throughout admission with improvement of uremia and increased PO intake as well as tx for HCAP.   # CHF: Patient with BNP to 8586, increased from 2389 3 weeks ago. Appeared euvolemic during admission. Home lasix held given elevated Cr. Was net negative i.os during admission.  Will cont to diurese well with foley in place.   # UTI: UA turbid with positive Nitrites and large leukocytes. S/p one dose of CTX, clinically stable (did have bump in WBCs to 15.5 but likely related to respiratory process) Urine CX- no growth. Likely related to retention.   # Electrolyte abnormalities: Potassium 5.5 on admission.  Suspected this was related to acute renal failure. Continued to improve on admission. Serum osm indicating dehydration likely 2/2 poor PO intake. Pt will d/c on home supplements. Will require continuous assistance with feeding  # Paroxysmal atrial fibrillation - Continued on home Dig. Level checked in light of mental status changes. Found to be 1.2. Dose was halved given poor renal function. Would recheck level in 1 week.   # LE ulcer and Sacral Decubitus ulcer (Stage 3): Sacral ulcer present on admission, did not appear infected at that time. And thus not believed to be etiology for MS changes. Wound care consulted and followed daily monitoring for acute infection. Stable on d/c.   # Dysautonomia and hypotension- Continued home Midodrine   # Hypothyroidism- Continued home synthroid   # Myasthenia - Continued Mesthinon, stable   Issues for Follow Up:  1. Outpt urology change of foley in 2 weeks 2. Recheck dig level in 1 week  Significant Procedures: None  Significant Labs and Imaging:   Recent Labs Lab 07/28/13 1130 07/29/13 0441 07/30/13 0640  WBC 14.1* 13.0* 11.9*  HGB 12.4* 12.0* 11.9*  HCT 37.9* 37.8* 37.7*  PLT 265 258 218    Recent Labs Lab 07/26/13 0817 07/27/13 0525 07/28/13 1130 07/29/13 0441 07/30/13 0640  NA 144 148* 147* 146* 143  K 3.5 2.9* 4.3 4.3 4.4  CL 103 108 110 109 107  CO2 28 28 29 30 31   GLUCOSE 162* 138* 179* 151* 117*  BUN 86* 75* 67* 62* 49*  CREATININE 2.29* 1.41* 1.18 1.16 1.20  CALCIUM 9.4 9.3 9.0 8.8 9.0   U/A- WBC TNTC, many bacteria  Ucx- neg  Urine Legionella neg  Strep Pneumo Antigen neg  Dg Chest Port 1 View  07/24/2013 IMPRESSION: Unchanged cardiomegaly and mild pulmonary vascular congestion. No evidence of pneumonia or overt pulmonary edema   Results/Tests Pending at Time of Discharge: None  Discharge Medications:    Medication List         collagenase ointment  Commonly known as:  SANTYL  Apply topically daily.      digoxin 0.125 MG tablet  Commonly known as:  LANOXIN  Take 1 tablet (125 mcg total) by mouth daily.     dorzolamide-timolol 22.3-6.8 MG/ML ophthalmic solution  Commonly known as:  COSOPT  Place 1 drop into both eyes daily.     feeding supplement (ENSURE) Pudg  Take 1 Container by mouth 2 (two) times daily between meals.     feeding supplement (PRO-STAT SUGAR FREE 64) Liqd  Take 30 mLs by mouth 3 (three) times daily with meals.     finasteride 5 MG tablet  Commonly known as:  PROSCAR  Take 5 mg by mouth daily.     furosemide 20 MG tablet  Commonly known as:  LASIX  Take 1 tablet (20 mg total) by mouth every other day.     levothyroxine 150 MCG tablet  Commonly known as:  SYNTHROID, LEVOTHROID  Take 150 mcg by mouth daily before breakfast.     meclizine 25 MG tablet  Commonly known as:  ANTIVERT  Take 25 mg by mouth 3 (three) times daily as  needed for dizziness.     midodrine 10 MG tablet  Commonly known as:  PROAMATINE  Take 10 mg by mouth 2 (two) times daily.     multivitamin with minerals Tabs tablet  Take 1 tablet by mouth daily.     pyridostigmine 60 MG tablet  Commonly known as:  MESTINON  Take 60 mg by mouth 3 (three) times daily. Breakfast lunch and dinner     traMADol 50 MG tablet  Commonly known as:  ULTRAM  Take 1 tablet (50 mg total) by mouth every 12 (twelve) hours as needed for moderate pain.        Discharge Instructions: Please refer to Patient Instructions section of EMR for full details.  Patient was counseled important signs and symptoms that should prompt return to medical care, changes in medications, dietary instructions, activity restrictions, and follow up appointments.   Follow-Up Appointments: Follow-up Information   Follow up with Elvina Sidle, MD. (as needed)    Specialty:  Family Medicine   Contact information:   [address] [place] [address] [phone]       Follow up with [facility]. (in 2 weeks  for foley check)    Contact information:   [address] [place] [address] [place] [address] [phone]      Anselm Lis, MD 07/30/2013, 12:55 PM PGY-1, [facility] Family Medicine

## 2013-07-29 NOTE — Plan of Care (Signed)
Problem: Phase I Progression Outcomes Goal: OOB as tolerated unless otherwise ordered Outcome: Not Met (add Reason) Pt very unsteady and weak unable to get OOB.

## 2013-07-29 NOTE — Progress Notes (Signed)
Family Medicine Teaching Service Daily Progress Note Intern Pager: [phone]  Patient name: CASSANDRA MCMANAMAN Medical record number: [id number] Date of birth: [birth year] Age: 79 y.o. Gender: male  Primary Care Provider: Elvina Sidle, MD Consultants: none Code Status: DNR  Pt Overview and Major Events to Date:   Assessment and Plan: KENSON GROH is a 79 y.o. male presenting with urinary retention and AKI . PMH is significant for myasthenia gravis, sacral decub, OSA, afib, HFpEF.   #Chest pain: Sudden onset yesterday, resolved with 1 of NG but dropped BP so would hold; EKG unchanged, trops neg; prior echo 07/03/13 with EF of 55%; not endorsing pain this morning  -cards consulted and is following -ASA, would hold nitro for low BPs -no acute interventions  #New Infiltrate on CXR, questionable desats (in light of inconsistent pulse ox readings): pt continues to be afebrile but had bump in WBC count unexplained by etiology other than pulmonary -Bcx obtained yesterday, cont vanc zosyn per pharmacy for coverage of possible HCAP -trend fever curve and CBCs for clinical response -will transition to PO levaquin if BCx negative at 48 hr  # AKI on CKD: patient with Cr to 5.56 on admission. Patient with likely obstruction as a cause given palpable bladder Hx of BPH, improved to1.41- suspect relief of bladder outlet obstruction will improve AKI  -will trend Cr on daily BMET  -will hold home lasix given elevated in Cr, monitor for fluid overload- questionable pulm edema on CXR -will continue home finasteride for BPH, urology would like to keep foley in place and send to outpt apptmt in 2 weeks  # Altered mental status: S/p ativan in the ED, MS continuing to improve  -continue to monitor -nutrition following, ordered ensure pudding BID and prostat liquid protein TID -encourage nursing to feed pt if assistance required   # CHF: patient with BNP to 8586, increased from 2389 3 weeks ago.  Appears Euvolemic this am   -will continue to monitor volume status with daily weights and strict I/O's- net negative 3.5L since admission -will hold home lasix at this time given Cr elevation - suspect he will diurese well on his own with the foley in place   -gentle rehydration  # UTI: UA turbid with positive Nitrites and large leukocytes  -s/p one dose of CTX, clinically stable (did have bump in WBCs to 15.5) -Urine CX- no growth -obtained blood cultures prior to antibiotics given elevation in WBC from previously - neg X48hrs, but will send again in light of HCAP  # Hypokalemia/hypernatremia: 5.5 on admission. Suspected this wass related to acute renal failure. 3.5 yesterday, 2.9 yesterday; serum osm indicating dehydration likely 2/2 poor PO intake -will monitor on am BMET  -encourage PO intake -gentle hydration  # Paroxysmal atrial fibrillation  -Continuing home Digoxin, level 1.2 -would recheck in 1 week  # LE ulcer and Sacral Decubitus ulcer (Stage 3): sacral ulcer does not appear infected at this time  -Wound care consulted  -continue to monitor for infection   # Dysautonomia and hypotension  - Continuing home Midodrine   # Hypothyroidism  - Continuing home synthroid   # Myasthenia  - Will continue [place].   FEN/GI: HHD,  PPx: HSQ  Disposition: likely d/c tomorrow to SNF if tolerated transition to PO levaquin  Subjective: notes doing well today. States wants to get up and out of bed.   Objective: Temp:  [97.3 F (36.3 C)-98.3 F (36.8 C)] 98.3 F (36.8 C) (12/21 1049) Pulse Rate:  [  68-82] 69 (12/21 1049) Resp:  [16-18] 18 (12/21 0543) BP: (110-126)/(57-64) 113/59 mmHg (12/21 1049) SpO2:  [97 %-100 %] 100 % (12/21 1049) Weight:  [144 lb 10 oz (65.6 kg)] 144 lb 10 oz (65.6 kg) (12/21 0543)  Intake/Output Summary (Last 24 hours) at 07/29/13 1422 Last data filed at 07/29/13 1219  Gross per 24 hour  Intake    880 ml  Output   1075 ml  Net   -195 ml     Physical Exam: General: NAD, sitting up in bed  HEENT: NCAT Cardiovascular: rrr, no murmurs appreciated, Respiratory: improved breath sounds with apparently fewer rhonchi, no evidence of increased WOB  Abdomen: soft, nt non-distended  Extremities: no edema noted in LE  Neuro: alert, no focal deficits  Laboratory:  Recent Labs Lab 07/27/13 0525 07/28/13 1130 07/29/13 0441  WBC 15.5* 14.1* 13.0*  HGB 13.2 12.4* 12.0*  HCT 39.4 37.9* 37.8*  PLT 305 265 258    Recent Labs Lab 07/27/13 0525 07/28/13 1130 07/29/13 0441  NA 148* 147* 146*  K 2.9* 4.3 4.3  CL 108 110 109  CO2 28 29 30   BUN 75* 67* 62*  CREATININE 1.41* 1.18 1.16  CALCIUM 9.3 9.0 8.8  GLUCOSE 138* 179* 151*    U/A- WBC TNTC, many bacteria  Ucx- neg Urine Legionella neg Strep Pneumo Antigen neg  Imaging/Diagnostic Tests: Dg Chest Port 1 View  07/24/2013 IMPRESSION: Unchanged cardiomegaly and mild pulmonary vascular congestion. No evidence of pneumonia or overt pulmonary edema   Glori Luis, MD 07/29/2013, 2:22 PM PGY-2, [facility] Family Medicine FPTS Intern pager: [phone], text pages welcome

## 2013-07-30 DIAGNOSIS — J189 Pneumonia, unspecified organism: Secondary | ICD-10-CM

## 2013-07-30 LAB — CBC
Platelets: 218 10*3/uL (ref 150–400)
RBC: 3.94 MIL/uL — ABNORMAL LOW (ref 4.22–5.81)
RDW: 14.9 % (ref 11.5–15.5)
WBC: 11.9 10*3/uL — ABNORMAL HIGH (ref 4.0–10.5)

## 2013-07-30 LAB — BASIC METABOLIC PANEL
Calcium: 9 mg/dL (ref 8.4–10.5)
Chloride: 107 mEq/L (ref 96–112)
Creatinine, Ser: 1.2 mg/dL (ref 0.50–1.35)
GFR calc Af Amer: 60 mL/min — ABNORMAL LOW (ref >90)
Glucose, Bld: 117 mg/dL — ABNORMAL HIGH (ref 70–99)
Potassium: 4.4 mEq/L (ref 3.5–5.1)
Sodium: 143 mEq/L (ref 135–145)

## 2013-07-30 MED ORDER — LEVOFLOXACIN 750 MG PO TABS: 750.0000 mg | ORAL_TABLET | ORAL | Status: DC

## 2013-07-30 NOTE — Progress Notes (Signed)
DC PICC per IV Team, DC Tele, DC [place]. Discharge instructions and home medications prepared by social worker and discussed with patient's daughter. Patient's daughter denied any questions or concerns at this time. Patient leaving unit via EMS with O2 and appears in no acute distress.

## 2013-07-30 NOTE — Discharge Summary (Signed)
FMTS Attending Note  I personally saw and evaluated the patient. The plan of care was discussed with the resident team. I agree with the assessment and plan as documented by the resident.   Marco Gregory continues to have altered mental status as a result of his multiple medical issues. This may be his new baseline however may improved with continued treatment of uremia/urinary retention/pneumonia.   Donnella Sham MD

## 2013-07-30 NOTE — Progress Notes (Signed)
Physical Therapy Treatment Patient Details Name: Marco Gregory MRN: [id number] DOB: 1920-12-12 Today's Date: 07/30/2013 Time: 7829-5621 PT Time Calculation (min): 23 min  PT Assessment / Plan / Recommendation  History of Present Illness Marco Gregory is a 79 y.o. male with a history of autonomic dysfunction with orthostatic hypotension, tachy-brady syndrome s/p pacemaker implantation, orthostatic hypotension, diastolic heart failure (EF: 65-70% 03/2012 ECHO), paroxysmal atrial fibrillation on digoxin and no anticoagulation, myasthenia gravis and history of falls who was admitted on 07/24/13 to [facility] with AKI secondary to post-renal obstruction   PT Comments   Pt agreeable to participate in therapy.  C/o back pain & dizziness when sitting EOB however BP WNL (see vitals section).  Pt's daughter present & states pt has not ambulated for the past month & has not been OOB since he's been in the hospital.     Follow Up Recommendations  SNF     Does the patient have the potential to tolerate intense rehabilitation     Barriers to Discharge        Equipment Recommendations  None recommended by PT    Recommendations for Other Services    Frequency Min 3X/week   Progress towards PT Goals Progress towards PT goals: Progressing toward goals  Plan Current plan remains appropriate    Precautions / Restrictions Precautions Precautions: Fall Restrictions Weight Bearing Restrictions: No   Pertinent Vitals/Pain C/o back pain & dizziness.  BP:  123/99 sitting EOB & 103/56 after transfering to recliner.      Mobility  Bed Mobility Bed Mobility: Supine to Sit;Sitting - Scoot to Edge of Bed Supine to Sit: 3: Mod assist;HOB elevated;With rails Sitting - Scoot to Edge of Bed: 3: Mod assist Details for Bed Mobility Assistance: Cues for technique.  (A) for LE's, to bring shoulders/trunk to sitting upright, & use of draw pad to bring hips closer to EOB.   Transfers Transfers: Sit  to Stand;Stand to Sit;Stand Pivot Transfers Sit to Stand: 1: +2 Total assist;From bed;With upper extremity assist Sit to Stand: Patient Percentage: 60% Stand to Sit: 1: +2 Total assist;With upper extremity assist;To chair/3-in-1;With armrests Stand to Sit: Patient Percentage: 60% Stand Pivot Transfers: 1: +2 Total assist Stand Pivot Transfers: Patient Percentage: 60% Details for Transfer Assistance: cues for hand placement & technique.  Pivoted without use of RW.  (A) to achieve standing, balance, rotation of hips into recliner, & controlled descent.  Pt remained flexed at hips but able to take small pivotal steps around to recliner.   Ambulation/Gait Ambulation/Gait Assistance: Not tested (comment)      PT Goals (current goals can now be found in the care plan section) Acute Rehab PT Goals PT Goal Formulation: With patient Time For Goal Achievement: 08/10/13 Potential to Achieve Goals: Fair  Visit Information  Last PT Received On: 07/30/13 Assistance Needed: +2 (OOB mobility) History of Present Illness: Marco Gregory is a 79 y.o. male with a history of autonomic dysfunction with orthostatic hypotension, tachy-brady syndrome s/p pacemaker implantation, orthostatic hypotension, diastolic heart failure (EF: 65-70% 03/2012 ECHO), paroxysmal atrial fibrillation on digoxin and no anticoagulation, myasthenia gravis and history of falls who was admitted on 07/24/13 to [facility] with AKI secondary to post-renal obstruction    Subjective Data      Cognition  Cognition Arousal/Alertness: Awake/alert Behavior During Therapy: [facility] for tasks assessed/performed Overall Cognitive Status: Within Functional Limits for tasks assessed    Balance  Static Sitting Balance Static Sitting - Level of Assistance: 5: Stand  by assistance Static Sitting - Comment/# of Minutes: Pt sat EOB with SBA, leaning fowards with elbows on knees due to back pain & dizziness.    End of Session PT - End of  Session Equipment Utilized During Treatment: Gait belt Activity Tolerance: Patient limited by fatigue;Patient limited by pain Patient left: in chair;with call bell/phone within reach;with family/visitor present Nurse Communication: Mobility status   GP     Lara Mulch 07/30/2013, 12:35 PM  Verdell Face, PTA [phone] 07/30/2013

## 2013-07-31 ENCOUNTER — Non-Acute Institutional Stay (SKILLED_NURSING_FACILITY): Payer: Medicare Other | Admitting: Nurse Practitioner

## 2013-07-31 DIAGNOSIS — E039 Hypothyroidism, unspecified: Secondary | ICD-10-CM

## 2013-07-31 DIAGNOSIS — N390 Urinary tract infection, site not specified: Secondary | ICD-10-CM

## 2013-07-31 DIAGNOSIS — L8993 Pressure ulcer of unspecified site, stage 3: Secondary | ICD-10-CM

## 2013-07-31 DIAGNOSIS — N179 Acute kidney failure, unspecified: Secondary | ICD-10-CM

## 2013-07-31 DIAGNOSIS — I4891 Unspecified atrial fibrillation: Secondary | ICD-10-CM

## 2013-07-31 DIAGNOSIS — J441 Chronic obstructive pulmonary disease with (acute) exacerbation: Secondary | ICD-10-CM

## 2013-07-31 DIAGNOSIS — I5032 Chronic diastolic (congestive) heart failure: Secondary | ICD-10-CM

## 2013-07-31 DIAGNOSIS — L89153 Pressure ulcer of sacral region, stage 3: Secondary | ICD-10-CM

## 2013-07-31 DIAGNOSIS — L89109 Pressure ulcer of unspecified part of back, unspecified stage: Secondary | ICD-10-CM

## 2013-07-31 LAB — CULTURE, BLOOD (ROUTINE X 2): Culture: NO GROWTH

## 2013-07-31 NOTE — Progress Notes (Signed)
Patient ID: Marco Gregory, male   DOB: 09-03-1920, 79 y.o.   MRN: [id number]    Nursing Home Location:  [facility]   Place of Service: SNF (31)  PCP: Elvina Sidle, MD  No Known Allergies  Chief Complaint  Patient presents with  . Hospital follow up    HPI:  Marco Gregory is a 79 y.o. male went to the hospital with urinary retention and AKI . PMH is significant for myasthenia gravis, sacral decub, OSA, afib, HFpEF.  Patient with Cr to 5.56 on admission to hospital. Hx of BPH, improved to1.41 with relief of bladder outlet obstruction; conts to have foley catheter; pt was also found to have new infiltrates on CXR with increase in WBC and was discharged on PO Levaquin; pt now at [facility] for ongoing rehab Today pt conts to have increased congestion Daughter is concerned due to decreased PO intake and lack of appetite; still taking POs with encouragement.  Review of Systems:  Review of Systems  Constitutional: Positive for weight loss. Negative for fever, chills and malaise/fatigue.  Respiratory: Positive for cough and sputum production. Negative for shortness of breath and wheezing.   Cardiovascular: Negative for chest pain and leg swelling.  Gastrointestinal: Negative for heartburn, abdominal pain, diarrhea and constipation.  Genitourinary: Negative for hematuria.       Foley catheter   Musculoskeletal: Negative for joint pain and myalgias.  Skin:       Wound on sacrum  Neurological: Positive for weakness. Negative for headaches. Dizziness: with changing positions.  Psychiatric/Behavioral: Negative for depression. The patient is not nervous/anxious.      Past Medical History  Diagnosis Date  . Long-term (current) use of anticoagulants   . Glaucoma   . Gout   . Hyperthyroidism   . Orthostatic hypotension   . Redundant prepuce and phimosis   . Acute cystitis   . Hypertonicity of bladder   . HTN (hypertension)   . Atelectasis   .  Dysautonomia   . Pacemaker- [facility] [employer]     DOI 2012  . CHF (congestive heart failure)     Previous preserved EF.  [name] Atrial fibrillation   . Sinus node dysfunction   . Pneumonia 08/2011    after hernia OR  . Hypothyroidism   . Sleep apnea     does not use cpap ; last used ~ 2011 (03/10/12)  . Spinal stenosis of lumbar region   . Chronic kidney disease [facility]     UTI  1 MO AGO TX MEDICALLY   . Osteomyelitis of toe of right foot ~ 2009    amputated 2nd toe  . Hypothyroid    Past Surgical History  Procedure Laterality Date  . Elbow bursa surgery  04/07/2006    Right elbow septic olecranon bursitis  . Toe amputation  ~ 2009    Osteomyelitis, right foot second toe /  Right foot second ray amputation  . Cardiac catheterization  07/03/2002    EF 60-70% /  Normal left ventricular function. / Very mild trivial three-vessel coronary atherosclerosis. / There is no mitral regurgitation noted  . Tonsillectomy and adenoidectomy      "when I was a youngster"  . Appendectomy      "when I was a youngster"  . Inguinal hernia repair  ~ 2003/13    right  . Cataract extraction w/ intraocular lens  implant, bilateral  01/2005   Social History:   reports that he quit smoking about 48 years  ago. His smoking use included Cigarettes. He has a 25 pack-year smoking history. He has never used smokeless tobacco. He reports that he does not drink alcohol or use illicit drugs.  Family History  Problem Relation Age of Onset  . Cancer Mother 43    GYN    Medications: Patient's Medications  New Prescriptions   No medications on file  Previous Medications   AMINO ACIDS-PROTEIN HYDROLYS (FEEDING SUPPLEMENT, PRO-STAT SUGAR FREE 64,) LIQD    Take 30 mLs by mouth 3 (three) times daily with meals.   COLLAGENASE (SANTYL) OINTMENT    Apply topically daily.   DIGOXIN (LANOXIN) 0.125 MG TABLET    Take 1 tablet (125 mcg total) by mouth daily.   DORZOLAMIDE-TIMOLOL (COSOPT) 22.3-6.8 MG/ML OPHTHALMIC SOLUTION     Place 1 drop into both eyes daily.    FEEDING SUPPLEMENT, ENSURE, (ENSURE) PUDG    Take 1 Container by mouth 2 (two) times daily between meals.   FINASTERIDE (PROSCAR) 5 MG TABLET    Take 5 mg by mouth daily.    FUROSEMIDE (LASIX) 20 MG TABLET    Take 1 tablet (20 mg total) by mouth every other day.   LEVOFLOXACIN (LEVAQUIN) 750 MG TABLET    Take 1 tablet (750 mg total) by mouth every other day.   LEVOTHYROXINE (SYNTHROID, LEVOTHROID) 150 MCG TABLET    Take 150 mcg by mouth daily before breakfast.   MECLIZINE (ANTIVERT) 25 MG TABLET    Take 25 mg by mouth 3 (three) times daily as needed for dizziness.   MIDODRINE (PROAMATINE) 10 MG TABLET    Take 10 mg by mouth 2 (two) times daily.   MULTIPLE VITAMIN (MULTIVITAMIN WITH MINERALS) TABS    Take 1 tablet by mouth daily.   PYRIDOSTIGMINE (MESTINON) 60 MG TABLET    Take 60 mg by mouth 3 (three) times daily. Breakfast lunch and dinner   TRAMADOL (ULTRAM) 50 MG TABLET    Take 1 tablet (50 mg total) by mouth every 12 (twelve) hours as needed for moderate pain.  Modified Medications   No medications on file  Discontinued Medications   No medications on file     Physical Exam:  Filed Vitals:   07/31/13 1621  BP: 114/60  Pulse: 64  Temp: 97.5 F (36.4 C)  Resp: 20    Physical Exam  Constitutional: No distress.  Thin male in NAD  HENT:  Mouth/Throat: Oropharynx is clear and moist. No oropharyngeal exudate.  Eyes: Conjunctivae and EOM are normal. Pupils are equal, round, and reactive to light.  Neck: Normal range of motion. Neck supple.  Cardiovascular: Normal rate, regular rhythm and normal heart sounds.   Pulmonary/Chest: Effort normal. No respiratory distress. He has decreased breath sounds. He has rhonchi (thoughtout).  Diminished   Abdominal: Soft. Bowel sounds are normal. He exhibits no distension.  Musculoskeletal: Normal range of motion. He exhibits no edema and no tenderness.  Lymphadenopathy:    He has no cervical adenopathy.   Neurological: He is alert.  Skin: Skin is warm and dry. He is not diaphoretic.     Labs reviewed: Basic Metabolic Panel:  Recent Labs  81/19/14 1130 07/29/13 0441 07/30/13 0640  NA 147* 146* 143  K 4.3 4.3 4.4  CL 110 109 107  CO2 29 30 31   GLUCOSE 179* 151* 117*  BUN 67* 62* 49*  CREATININE 1.18 1.16 1.20  CALCIUM 9.0 8.8 9.0   Liver Function Tests:  Recent Labs  07/03/13 0615  AST 22  ALT 14  ALKPHOS 84  BILITOT 0.9  PROT 6.4  ALBUMIN 2.7*   No results found for this basename: LIPASE, AMYLASE,  in the last 8760 hours No results found for this basename: AMMONIA,  in the last 8760 hours CBC:  Recent Labs  07/02/13 1611  07/24/13 1549  07/28/13 1130 07/29/13 0441 07/30/13 0640  WBC 7.8  < > 13.5*  < > 14.1* 13.0* 11.9*  NEUTROABS 5.7  --  10.9*  --   --  10.8*  --   HGB 11.4*  < > 13.1  < > 12.4* 12.0* 11.9*  HCT 33.4*  < > 38.6*  < > 37.9* 37.8* 37.7*  MCV 92.0  < > 91.0  < > 93.3 95.2 95.7  PLT 206  < > 322  < > 265 258 218  < > = values in this interval not displayed. Cardiac Enzymes:  Recent Labs  07/25/13 1600 07/27/13 1301 07/28/13 0040  TROPONINI <0.30 <0.30 <0.30     Assessment/Plan 1. Atrial fibrillation -rate controlled; will follow up dig level   2. Chronic diastolic heart failure -stable; no signs of worsening CHF -will have staff to do M and th weights  3. Hypothyroid -conts levothyroxine   4. Acute renal failure - encouraged hyrdation -follow up bmp in 1 week   5. UTI (urinary tract infection) -conts on levaquin, has foley  6. Decubitus ulcer of sacral region, stage 3 -stable following with wound care and treatment nurse   7. COPD exacerbation -conts on Levaquin -will schedule duonebs due to cough and congestion q 6 hours for 5 days -mucinex DM for BID 7 days    8 Weight loss -decrease appetite -encouraged family and staff to encourage pt to eat and drink -will start Remeron 15 mg qhs at this time

## 2013-08-02 LAB — CULTURE, BLOOD (ROUTINE X 2)
Culture: NO GROWTH
Culture: NO GROWTH

## 2013-08-02 NOTE — Progress Notes (Signed)
OK per MD for d/c today back to [facility] where he resides with his wife. Patient and daughter [name] were pleased with d/c arrangement to go back to SNF. Notified Rhonda at [facility] admissions- she stated that bed was ready for patient.  Nursing called report.  No further CSW needs identified.  CSW signing off.  Lorri Frederick. [place]  [phone]

## 2013-08-03 ENCOUNTER — Non-Acute Institutional Stay (SKILLED_NURSING_FACILITY): Payer: Medicare Other | Admitting: Internal Medicine

## 2013-08-03 ENCOUNTER — Encounter: Payer: Self-pay | Admitting: Internal Medicine

## 2013-08-03 DIAGNOSIS — Z978 Presence of other specified devices: Secondary | ICD-10-CM

## 2013-08-03 DIAGNOSIS — L8993 Pressure ulcer of unspecified site, stage 3: Secondary | ICD-10-CM

## 2013-08-03 DIAGNOSIS — I5032 Chronic diastolic (congestive) heart failure: Secondary | ICD-10-CM

## 2013-08-03 DIAGNOSIS — N179 Acute kidney failure, unspecified: Secondary | ICD-10-CM

## 2013-08-03 DIAGNOSIS — J189 Pneumonia, unspecified organism: Secondary | ICD-10-CM

## 2013-08-03 DIAGNOSIS — R079 Chest pain, unspecified: Secondary | ICD-10-CM

## 2013-08-03 DIAGNOSIS — L89109 Pressure ulcer of unspecified part of back, unspecified stage: Secondary | ICD-10-CM

## 2013-08-03 DIAGNOSIS — Q078 Other specified congenital malformations of nervous system: Secondary | ICD-10-CM

## 2013-08-03 DIAGNOSIS — L89153 Pressure ulcer of sacral region, stage 3: Secondary | ICD-10-CM

## 2013-08-03 DIAGNOSIS — E039 Hypothyroidism, unspecified: Secondary | ICD-10-CM

## 2013-08-03 DIAGNOSIS — Z9889 Other specified postprocedural states: Secondary | ICD-10-CM

## 2013-08-03 DIAGNOSIS — G7000 Myasthenia gravis without (acute) exacerbation: Secondary | ICD-10-CM

## 2013-08-03 DIAGNOSIS — I4891 Unspecified atrial fibrillation: Secondary | ICD-10-CM

## 2013-08-03 DIAGNOSIS — I480 Paroxysmal atrial fibrillation: Secondary | ICD-10-CM

## 2013-08-03 DIAGNOSIS — Z950 Presence of cardiac pacemaker: Secondary | ICD-10-CM

## 2013-08-03 NOTE — Assessment & Plan Note (Addendum)
CXR SHOWED QUESTIONABLE NEW INFILTRATE AND HAD A QUESTIONABLE EPISODE OF DESATING - PT WAS TXED FOR PNA WITH IV THEN 5 Days levaquin, last dose 12/27

## 2013-08-03 NOTE — Assessment & Plan Note (Signed)
Pt's BNP has elevated from 3 weeks prior despite being euvolemic so fliorinef is probably not a good idea; because he gets dizzy sitting and standing I think we need to do PT while pt reclining;same with OT;increase midodrine to TID

## 2013-08-03 NOTE — Assessment & Plan Note (Signed)
Cardiac cause ruled out during hospital stay;OF NOTE PT'S BP DROPPED 30 POINTS WITH NTG 1/150

## 2013-08-03 NOTE — Progress Notes (Signed)
MRN: [id number] Name: Marco Gregory  Sex: male Age: 79 y.o. DOB: October 28, 1920  PSC #: [facility] Facility/Room: 305B Level Of Care: SNF Provider: Merrilee Seashore D Emergency Contacts: Extended Emergency Contact Information Primary Emergency Contact: Whiten-Edmonds,Joyce Address: [address]           [place], [place] [address] [place] Home Phone: [phone] Mobile Phone: [phone] Relation: Daughter Secondary Emergency Contact: [name] Address: [address]          [place], [place] [address] [place] Home Phone: [phone] Relation: Spouse  Code Status: DNR  Allergies: Review of patient's allergies indicates no known allergies.  Chief Complaint  Patient presents with  . nursing home admission    HPI: Patient is 79 y.o. male who was admitted to hospital for UOO causing rise in Cr and electrolyte disturbance and CP which ruled out for CAD problem.  Past Medical History  Diagnosis Date  . Long-term (current) use of anticoagulants   . Glaucoma   . Gout   . Hyperthyroidism   . Orthostatic hypotension   . Redundant prepuce and phimosis   . Acute cystitis   . Hypertonicity of bladder   . HTN (hypertension)   . Atelectasis   . Dysautonomia   . Pacemaker- st [employer]     DOI 2012  . CHF (congestive heart failure)     Previous preserved EF.  [name] Atrial fibrillation   . Sinus node dysfunction   . Pneumonia 08/2011    after hernia OR  . Hypothyroidism   . Sleep apnea     does not use cpap ; last used ~ 2011 (03/10/12)  . Spinal stenosis of lumbar region   . Chronic kidney disease [facility]     UTI  1 MO AGO TX MEDICALLY   . Osteomyelitis of toe of right foot ~ 2009    amputated 2nd toe  . Hypothyroid     Past Surgical History  Procedure Laterality Date  . Elbow bursa surgery  04/07/2006    Right elbow septic olecranon bursitis  . Toe amputation  ~ 2009    Osteomyelitis, right foot second toe /  Right foot  second ray amputation  . Cardiac catheterization  07/03/2002    EF 60-70% /  Normal left ventricular function. / Very mild trivial three-vessel coronary atherosclerosis. / There is no mitral regurgitation noted  . Tonsillectomy and adenoidectomy      "when I was a youngster"  . Appendectomy      "when I was a youngster"  . Inguinal hernia repair  ~ 2003/13    right  . Cataract extraction w/ intraocular lens  implant, bilateral  01/2005      Medication List       This list is accurate as of: 08/03/13 11:59 PM.  Always use your most recent med list.               collagenase ointment  Commonly known as:  SANTYL  Apply topically daily.     digoxin 0.125 MG tablet  Commonly known as:  LANOXIN  Take 1 tablet (125 mcg total) by mouth daily.     dorzolamide-timolol 22.3-6.8 MG/ML ophthalmic solution  Commonly known as:  COSOPT  Place 1 drop into both eyes daily.     feeding supplement (ENSURE) Pudg  Take 1 Container by mouth 2 (two) times daily between meals.     feeding supplement (PRO-STAT SUGAR FREE 64) Liqd  Take 30  mLs by mouth 3 (three) times daily with meals.     finasteride 5 MG tablet  Commonly known as:  PROSCAR  Take 5 mg by mouth daily.     furosemide 20 MG tablet  Commonly known as:  LASIX  Take 1 tablet (20 mg total) by mouth every other day.     levofloxacin 750 MG tablet  Commonly known as:  LEVAQUIN  Take 1 tablet (750 mg total) by mouth every other day.     levothyroxine 150 MCG tablet  Commonly known as:  SYNTHROID, LEVOTHROID  Take 150 mcg by mouth daily before breakfast.     meclizine 25 MG tablet  Commonly known as:  ANTIVERT  Take 25 mg by mouth 3 (three) times daily as needed for dizziness.     midodrine 10 MG tablet  Commonly known as:  PROAMATINE  Take 10 mg by mouth 2 (two) times daily.     multivitamin with minerals Tabs tablet  Take 1 tablet by mouth daily.     pyridostigmine 60 MG tablet  Commonly known as:  MESTINON  Take 60  mg by mouth 3 (three) times daily. Breakfast lunch and dinner     traMADol 50 MG tablet  Commonly known as:  ULTRAM  Take 1 tablet (50 mg total) by mouth every 12 (twelve) hours as needed for moderate pain.        No orders of the defined types were placed in this encounter.    Immunization History  Administered Date(s) Administered  . Influenza,inj,Quad PF,36+ Mos 07/04/2013  . Influenza-Unspecified 07/04/2013    History  Substance Use Topics  . Smoking status: Former Smoker -- 1.00 packs/day for 25 years    Types: Cigarettes    Quit date: 08/09/1965  . Smokeless tobacco: Never Used  . Alcohol Use: No     Comment: "last alcohol ~ 2008"    Family history is noncontributory    Review of Systems  DATA OBTAINED: from patient,  family member GENERAL: Feels well no fevers, fatigue, appetite still decreased SKIN: No itching, rash  EYES: No eye pain, redness, discharge EARS: No earache, tinnitus, change in hearing NOSE: No congestion, drainage or bleeding  MOUTH/THROAT: No mouth or tooth pain, No sore throat, No difficulty chewing or swallowing  RESPIRATORY: No cough, wheezing, SOB CARDIAC: No chest pain, palpitations, lower extremity edema  GI: No abdominal pain, No N/V/D or constipation, No heartburn or reflux  GU: No dysuria, frequency or urgency, or incontinence  MUSCULOSKELETAL: No unrelieved bone/joint pain NEUROLOGIC: No headache, dizziness or focal weakness PSYCHIATRIC: No overt anxiety or sadness. Sleeps well. No behavior issue.   Filed Vitals:   08/03/13 1510  BP: 90/52  Pulse: 93  Temp: 97.1 F (36.2 C)  Resp: 20    Physical Exam  GENERAL APPEARANCE: Alert, conversant. Appropriately groomed. No acute distress.  SKIN: No diaphoresis rash; new wound R great toe, not infected HEAD: Normocephalic, atraumatic  EYES: Conjunctiva/lids clear. Pupils round, reactive. EOMs intact.  EARS: External exam WNL, canals clear. Hearing grossly normal.  NOSE: No  deformity or discharge.  MOUTH/THROAT: Lips w/o lesions.  RESPIRATORY: Breathing is even, unlabored. Lung sounds are clear   CARDIOVASCULAR: Heart RRR no murmurs, rubs or gallops. No peripheral edema.   GASTROINTESTINAL: Abdomen is soft, non-tender, not distended w/ normal bowel sounds. GENITOURINARY: Bladder non tender, not distended ; foley in place MUSCULOSKELETAL: No abnormal joints or musculature NEUROLOGIC: Oriented X2. Cranial nerves 2-12 grossly intact. Moves all extremities no  tremor. PSYCHIATRIC: Mood and affect appropriate to situation, no behavioral issues  Patient Active Problem List   Diagnosis Date Noted  . Unspecified hypothyroidism 08/04/2013  . Foley catheter in place 08/04/2013  . HCAP (healthcare-associated pneumonia) 08/03/2013  . Chest pain   . Acute kidney injury 07/25/2013  . Acute renal failure 07/24/2013  . Decubitus ulcer of sacral region, stage 3 07/03/2013  . Acute exacerbation of CHF (congestive heart failure) 07/03/2013  . Fall at home 07/03/2013  . Skin ulcer 06/26/2013  . UTI (urinary tract infection) 06/26/2013  . PAF (paroxysmal atrial fibrillation) 08/07/2012  . Long-term (current) use of anticoagulants   . Chronic diastolic heart failure 04/19/2012  . Orthostasis 04/19/2012  . Myasthenia gravis 04/19/2012  . Pancreatic mass 04/19/2012  . COPD exacerbation 03/10/2012  . Overactive bladder 04/05/2011  . Atrial fibrillation 12/03/2008  . DYSAUTONOMIA 12/03/2008  . 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 OBSTRUCTIVE SLEEP APNEA 11/24/2007    CBC  12/24  WBC 10.8  11.8/36.3  PLT 208    Component Value Date/Time   WBC 11.9* 07/30/2013 0640   WBC 7.5 06/07/2012 1607   RBC 3.94* 07/30/2013 0640   RBC 4.52* 06/07/2012 1607   HGB 11.9* 07/30/2013 0640   HGB 13.7* 06/07/2012 1607   HCT 37.7* 07/30/2013 0640   HCT 45.2 06/07/2012 1607   PLT 218 07/30/2013 0640   MCV 95.7 07/30/2013 0640   MCV 99.9* 06/07/2012 1607   LYMPHSABS 1.3 07/29/2013 0441    MONOABS 0.8 07/29/2013 0441   EOSABS 0.1 07/29/2013 0441   BASOSABS 0.0 07/29/2013 0441    CMP 12/24  139, 4.1, 105, 28, 114, 35/1.02   Ca 8.4    Component Value Date/Time   NA 143 07/30/2013 0640   K 4.4 07/30/2013 0640   CL 107 07/30/2013 0640   CO2 31 07/30/2013 0640   GLUCOSE 117* 07/30/2013 0640   BUN 49* 07/30/2013 0640   CREATININE 1.20 07/30/2013 0640   CREATININE 1.06 06/07/2012 1603   CALCIUM 9.0 07/30/2013 0640   PROT 6.4 07/03/2013 0615   ALBUMIN 2.7* 07/03/2013 0615   AST 22 07/03/2013 0615   ALT 14 07/03/2013 0615   ALKPHOS 84 07/03/2013 0615   BILITOT 0.9 07/03/2013 0615   GFRNONAA 51* 07/30/2013 0640   GFRAA 60* 07/30/2013 0640    Assessment and Plan  Chest pain Cardiac cause ruled out during hospital stay;OF NOTE PT'S BP DROPPED 30 POINTS WITH NTG 1/150  HCAP (healthcare-associated pneumonia) CXR SHOWED QUESTIONABLE NEW INFILTRATE AND HAD A QUESTIONABLE EPISODE OF DESATING - PT WAS TXED FOR PNA WITH IV THEN 5 Days levaquin, last dose 12/27  DYSAUTONOMIA Pt's BNP has elevated from 3 weeks prior despite being euvolemic so fliorinef is probably not a good idea; because he gets dizzy sitting and standing I think we need to do PT while pt reclining;same with OT;increase midodrine to TID  Acute renal failure Pt's Cr on admission was 5.56; felt to be secondary to obstruction given hx BPH and extremey full bladder that resolved with foley;Cr improved to 1.41; foley in place until follow up with urology in 2 weeks  Chronic diastolic heart failure Even though BNP had increased in 3 weeks from 2389 to 8586 it was felt that he was euvolemic; Pt returns on QOD lasix 20 mg and digoxin 0.125 which dose was halved in hospital;level on 12/24 was 1.2  PAF (paroxysmal atrial fibrillation) Pt has refused anti-coagulation; he has had multiple falls  Hypothyroid Pt's TSH  Unspecified hypothyroidism Pt's  TSH on 12/24 4.655, unchanged from a month ago;because pt is so weak  and because his BP is so low i am going to increase thyroid replacement to  Decubitus ulcer of sacral region, stage 3 Was not infected in hospital-wound care will continue as well as wound care for his R great toe ordered  Myasthenia gravis Will continue mestinon  Foley catheter in place For UOO;f/u u\with urology 2 weeks to re-assess    Margit Hanks, MD

## 2013-08-04 DIAGNOSIS — E039 Hypothyroidism, unspecified: Secondary | ICD-10-CM | POA: Insufficient documentation

## 2013-08-04 NOTE — Assessment & Plan Note (Signed)
Will continue mestinon

## 2013-08-04 NOTE — Assessment & Plan Note (Signed)
Even though BNP had increased in 3 weeks from 2389 to 8586 it was felt that he was euvolemic; Pt returns on QOD lasix 20 mg and digoxin 0.125 which dose was halved in hospital;level on 12/24 was 1.2

## 2013-08-04 NOTE — Assessment & Plan Note (Signed)
Pt has refused anti-coagulation; he has had multiple falls

## 2013-08-04 NOTE — Assessment & Plan Note (Signed)
Pt's Cr on admission was 5.56; felt to be secondary to obstruction given hx BPH and extremey full bladder that resolved with foley;Cr improved to 1.41; foley in place until follow up with urology in 2 weeks

## 2013-08-04 NOTE — Assessment & Plan Note (Signed)
For UOO;f/u u\with urology 2 weeks to re-assess

## 2013-08-04 NOTE — Assessment & Plan Note (Signed)
Pt's TSH on 12/24 4.655, unchanged from a month ago;because pt is so weak and because his BP is so low i am going to increase thyroid replacement to 

## 2013-08-04 NOTE — Assessment & Plan Note (Signed)
Was not infected in hospital-wound care will continue as well as wound care for his R great toe ordered

## 2013-08-04 NOTE — Assessment & Plan Note (Signed)
Pt's TSH

## 2013-08-06 ENCOUNTER — Other Ambulatory Visit: Payer: Self-pay

## 2013-08-06 MED ORDER — TRAMADOL HCL 50 MG PO TABS
50.0000 mg | ORAL_TABLET | Freq: Two times a day (BID) | ORAL | Status: DC | PRN
Start: 2013-08-06 — End: 2013-08-29

## 2013-08-13 ENCOUNTER — Encounter: Payer: Self-pay | Admitting: Internal Medicine

## 2013-08-13 ENCOUNTER — Non-Acute Institutional Stay (SKILLED_NURSING_FACILITY): Payer: Medicare Other | Admitting: Internal Medicine

## 2013-08-13 DIAGNOSIS — L97509 Non-pressure chronic ulcer of other part of unspecified foot with unspecified severity: Secondary | ICD-10-CM

## 2013-08-13 DIAGNOSIS — R062 Wheezing: Secondary | ICD-10-CM

## 2013-08-13 DIAGNOSIS — Z710 Person encountering health services to consult on behalf of another person: Secondary | ICD-10-CM

## 2013-08-13 DIAGNOSIS — L97519 Non-pressure chronic ulcer of other part of right foot with unspecified severity: Secondary | ICD-10-CM | POA: Insufficient documentation

## 2013-08-13 DIAGNOSIS — R059 Cough, unspecified: Secondary | ICD-10-CM

## 2013-08-13 DIAGNOSIS — R638 Other symptoms and signs concerning food and fluid intake: Secondary | ICD-10-CM

## 2013-08-13 DIAGNOSIS — Z7189 Other specified counseling: Secondary | ICD-10-CM

## 2013-08-13 DIAGNOSIS — Q078 Other specified congenital malformations of nervous system: Secondary | ICD-10-CM

## 2013-08-13 DIAGNOSIS — R05 Cough: Secondary | ICD-10-CM

## 2013-08-13 NOTE — Assessment & Plan Note (Signed)
Pt's BP readings last few days-130/60, 128/78,128/64,101/62, 134/71, 89/50; midodrine was inc to TID and synthroid increased,lasix QOD so maybe these are having an effect on his BP

## 2013-08-13 NOTE — Progress Notes (Signed)
MRN: [id number] Name: Marco Gregory  Sex: male Age: 80 y.o. DOB: 1920-09-08  PSC #: [name] Facility/Room: 305B Level Of Care: SNF Provider: Merrilee Seashore D Emergency Contacts: Extended Emergency Contact Information Primary Emergency Contact: Pelster-Edmonds,Joyce Address: [address]           [place], [place] [address] [place] Home Phone: [phone] Mobile Phone: [phone] Relation: Daughter Secondary Emergency Contact: [name] Address: [address]          [place], [place] [address] [place] Home Phone: [phone] Relation: Spouse  Code Status: DNR  Allergies: Review of patient's allergies indicates no known allergies.  Chief Complaint  Patient presents with  . Medical Managment of Chronic Issues    HPI: Patient is 80 y.o. male who is being seen for   Past Medical History  Diagnosis Date  . Long-term (current) use of anticoagulants   . Glaucoma   . Gout   . Hyperthyroidism   . Orthostatic hypotension   . Redundant prepuce and phimosis   . Acute cystitis   . Hypertonicity of bladder   . HTN (hypertension)   . Atelectasis   . Dysautonomia   . Pacemaker- st [employer]     DOI 2012  . CHF (congestive heart failure)     Previous preserved EF.  [name] Atrial fibrillation   . Sinus node dysfunction   . Pneumonia 08/2011    after hernia OR  . Hypothyroidism   . Sleep apnea     does not use cpap ; last used ~ 2011 (03/10/12)  . Spinal stenosis of lumbar region   . Chronic kidney disease [facility]     UTI  1 MO AGO TX MEDICALLY   . Osteomyelitis of toe of right foot ~ 2009    amputated 2nd toe  . Hypothyroid     Past Surgical History  Procedure Laterality Date  . Elbow bursa surgery  04/07/2006    Right elbow septic olecranon bursitis  . Toe amputation  ~ 2009    Osteomyelitis, right foot second toe /  Right foot second ray amputation  . Cardiac catheterization  07/03/2002    EF 60-70% /  Normal  left ventricular function. / Very mild trivial three-vessel coronary atherosclerosis. / There is no mitral regurgitation noted  . Tonsillectomy and adenoidectomy      "when I was a youngster"  . Appendectomy      "when I was a youngster"  . Inguinal hernia repair  ~ 2003/13    right  . Cataract extraction w/ intraocular lens  implant, bilateral  01/2005      Medication List       This list is accurate as of: 08/13/13  8:05 PM.  Always use your most recent med list.               collagenase ointment  Commonly known as:  SANTYL  Apply topically daily.     digoxin 0.125 MG tablet  Commonly known as:  LANOXIN  Take 1 tablet (125 mcg total) by mouth daily.     dorzolamide-timolol 22.3-6.8 MG/ML ophthalmic solution  Commonly known as:  COSOPT  Place 1 drop into both eyes daily.     feeding supplement (ENSURE) Pudg  Take 1 Container by mouth 2 (two) times daily between meals.     feeding supplement (PRO-STAT SUGAR FREE 64) Liqd  Take 30 mLs by mouth 3 (three) times daily with meals.  finasteride 5 MG tablet  Commonly known as:  PROSCAR  Take 5 mg by mouth daily.     furosemide 20 MG tablet  Commonly known as:  LASIX  Take 1 tablet (20 mg total) by mouth every other day.     levofloxacin 750 MG tablet  Commonly known as:  LEVAQUIN  Take 1 tablet (750 mg total) by mouth every other day.     levothyroxine 150 MCG tablet  Commonly known as:  SYNTHROID, LEVOTHROID  Take 150 mcg by mouth daily before breakfast.     meclizine 25 MG tablet  Commonly known as:  ANTIVERT  Take 25 mg by mouth 3 (three) times daily as needed for dizziness.     midodrine 10 MG tablet  Commonly known as:  PROAMATINE  Take 10 mg by mouth 3 (three) times daily.     multivitamin with minerals Tabs tablet  Take 1 tablet by mouth daily.     pyridostigmine 60 MG tablet  Commonly known as:  MESTINON  Take 60 mg by mouth 3 (three) times daily. Breakfast lunch and dinner     traMADol 50 MG  tablet  Commonly known as:  ULTRAM  Take 1 tablet (50 mg total) by mouth every 12 (twelve) hours as needed for moderate pain.        No orders of the defined types were placed in this encounter.    Immunization History  Administered Date(s) Administered  . Influenza,inj,Quad PF,36+ Mos 07/04/2013  . Influenza-Unspecified 07/04/2013    History  Substance Use Topics  . Smoking status: Former Smoker -- 1.00 packs/day for 25 years    Types: Cigarettes    Quit date: 08/09/1965  . Smokeless tobacco: Never Used  . Alcohol Use: No     Comment: "last alcohol ~ 2008"    Review of Systems  DATA OBTAINED: from patient, nurses, daughter GENERAL:no fevers; sleeps a lot, poor appetite SKIN: No itching, rash HEENT: No complaint RESPIRATORY: + cough, no wheezing, no SOB CARDIAC: No chest pain, palpitations, lower extremity edema  GI: No abdominal pain, No N/V/D or constipation, No heartburn or reflux  GU: No dysuria, frequency or urgency, or incontinence  MUSCULOSKELETAL: No unrelieved bone/joint pain NEUROLOGIC: No headache, dizziness or focal weakness PSYCHIATRIC: No overt anxiety or sadness. Sleeps well.   Filed Vitals:   08/13/13 1252  BP: 97/54  Pulse: 63  Temp: 99.1 F (37.3 C)  Resp: 22    Physical Exam  GENERAL APPEARANCE: Alert but sleepy,mod  conversant. Appropriately groomed. No acute distress  SKIN: No diaphoresis rash; all wounds are wrapped-R great toe, B legs, sacrum HEENT: Unremarkable RESPIRATORY: Breathing is even, unlabored. Lung sounds are full but wheezing ; bedside O2 sat per ADAMD on RA 96% with pulse of 59 CARDIOVASCULAR: Heart RRR no murmurs, rubs or gallops. No peripheral edema  GASTROINTESTINAL: Abdomen is soft, non-tender, not distended w/ normal bowel sounds.  GENITOURINARY: Bladder non tender, not distended  MUSCULOSKELETAL: No abnormal joints or musculature NEUROLOGIC: Cranial nerves 2-12 grossly intact. Moves all extremities no  tremor. PSYCHIATRIC: Mood and affect appropriate to situation, no behavioral issues  Patient Active Problem List   Diagnosis Date Noted  . Ulcer of toe of right foot 08/13/2013  . Unspecified hypothyroidism 08/04/2013  . Foley catheter in place 08/04/2013  . HCAP (healthcare-associated pneumonia) 08/03/2013  . Chest pain   . Acute kidney injury 07/25/2013  . Acute renal failure 07/24/2013  . Decubitus ulcer of sacral region, stage 3 07/03/2013  .  Acute exacerbation of CHF (congestive heart failure) 07/03/2013  . Fall at home 07/03/2013  . Skin ulcer 06/26/2013  . UTI (urinary tract infection) 06/26/2013  . PAF (paroxysmal atrial fibrillation) 08/07/2012  . Long-term (current) use of anticoagulants   . Chronic diastolic heart failure 04/19/2012  . Orthostasis 04/19/2012  . Myasthenia gravis 04/19/2012  . Pancreatic mass 04/19/2012  . COPD exacerbation 03/10/2012  . Overactive bladder 04/05/2011  . Atrial fibrillation 12/03/2008  . DYSAUTONOMIA 12/03/2008  . 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 OBSTRUCTIVE SLEEP APNEA 11/24/2007    CBC    Component Value Date/Time   WBC 11.9* 07/30/2013 0640   WBC 7.5 06/07/2012 1607   RBC 3.94* 07/30/2013 0640   RBC 4.52* 06/07/2012 1607   HGB 11.9* 07/30/2013 0640   HGB 13.7* 06/07/2012 1607   HCT 37.7* 07/30/2013 0640   HCT 45.2 06/07/2012 1607   PLT 218 07/30/2013 0640   MCV 95.7 07/30/2013 0640   MCV 99.9* 06/07/2012 1607   LYMPHSABS 1.3 07/29/2013 0441   MONOABS 0.8 07/29/2013 0441   EOSABS 0.1 07/29/2013 0441   BASOSABS 0.0 07/29/2013 0441    CMP     Component Value Date/Time   NA 143 07/30/2013 0640   K 4.4 07/30/2013 0640   CL 107 07/30/2013 0640   CO2 31 07/30/2013 0640   GLUCOSE 117* 07/30/2013 0640   BUN 49* 07/30/2013 0640   CREATININE 1.20 07/30/2013 0640   CREATININE 1.06 06/07/2012 1603   CALCIUM 9.0 07/30/2013 0640   PROT 6.4 07/03/2013 0615   ALBUMIN 2.7* 07/03/2013 0615   AST 22 07/03/2013 0615   ALT 14  07/03/2013 0615   ALKPHOS 84 07/03/2013 0615   BILITOT 0.9 07/03/2013 0615   GFRNONAA 51* 07/30/2013 0640   GFRAA 60* 07/30/2013 0640    Assessment and Plan  DYSAUTONOMIA Pt's BP readings last few days-130/60, 128/78,128/64,101/62, 134/71, 89/50; midodrine was inc to TID and synthroid increased,lasix QOD so maybe these are having an effect on his BP  Ulcer of toe of right foot Wound care did see pr, debreided area, small pocket pus returned and pt was put on Doxy for 10 days and toe looks better;;cx did grow out  MRSA   WHEEZING- PPT HAS HAD cough for several days-he is on doxy and has been left on levaquin until today; O2 sat 96% RA; alb nebs TID  DECREASED PO INTAKE - both food and liquids although his daughter says he likes ginger ale; we discussed what to do about it if it persists like feeding tube or IVF; no decisions were made  FAMILY CONFERENCE/END OF LIFE -we spoke of future plans, like Dad dying at home, both Mom and Dad at [employer] spoke with Tommie, hospice nurse who said both need to be on Hospice and Wynona Canes had some ideas too. Daughter is not quite ready for hospice but almost  Margit Hanks, MD

## 2013-08-13 NOTE — Assessment & Plan Note (Signed)
Wound care did see pr, debreided area, small pocket pus returned and pt was put on Doxy for 10 days and toe looks better;;cx did grow out  MRSA

## 2013-08-29 ENCOUNTER — Other Ambulatory Visit: Payer: Self-pay | Admitting: *Deleted

## 2013-08-29 ENCOUNTER — Encounter: Payer: Self-pay | Admitting: Family Medicine

## 2013-08-29 MED ORDER — TRAMADOL HCL 50 MG PO TABS: ORAL_TABLET | ORAL | Status: DC

## 2013-09-03 ENCOUNTER — Encounter: Payer: Self-pay | Admitting: Internal Medicine

## 2013-09-03 ENCOUNTER — Non-Acute Institutional Stay (SKILLED_NURSING_FACILITY): Payer: Medicare Other | Admitting: Internal Medicine

## 2013-09-03 DIAGNOSIS — R635 Abnormal weight gain: Secondary | ICD-10-CM

## 2013-09-03 DIAGNOSIS — I951 Orthostatic hypotension: Secondary | ICD-10-CM

## 2013-09-03 DIAGNOSIS — R627 Adult failure to thrive: Secondary | ICD-10-CM

## 2013-09-03 NOTE — Progress Notes (Signed)
MRN: [id number] Name: Marco Gregory  Sex: male Age: 80 y.o. DOB: 04/19/1921  PSC #: [name] Facility/Room: 305B Level Of Care: SNF Provider: Merrilee Seashore D Emergency Contacts: Extended Emergency Contact Information Primary Emergency Contact: Mandel-Edmonds,Joyce Address: [address]           [place], [place] [address] [place] Home Phone: [phone] Mobile Phone: [phone] Relation: Daughter Secondary Emergency Contact: [name] Address: [address]          [place], [place] [address] [place] Home Phone: [phone] Relation: Spouse  Code Status:   Allergies: Review of patient's allergies indicates no known allergies.  Chief Complaint  Patient presents with  . Medical Managment of Chronic Issues    HPI: Patient is 80 y.o. male who has gained 4.5 pounds in a week. He is very fragile. He has had PNA in the past month and has in the past days lost his wife.  Past Medical History  Diagnosis Date  . Long-term (current) use of anticoagulants   . Glaucoma   . Gout   . Hyperthyroidism   . Orthostatic hypotension   . Redundant prepuce and phimosis   . Acute cystitis   . Hypertonicity of bladder   . HTN (hypertension)   . Atelectasis   . Dysautonomia   . Pacemaker- st [employer]     DOI 2012  . CHF (congestive heart failure)     Previous preserved EF.  [name] Atrial fibrillation   . Sinus node dysfunction   . Pneumonia 08/2011    after hernia OR  . Hypothyroidism   . Sleep apnea     does not use cpap ; last used ~ 2011 (03/10/12)  . Spinal stenosis of lumbar region   . Chronic kidney disease [facility]     UTI  1 MO AGO TX MEDICALLY   . Osteomyelitis of toe of right foot ~ 2009    amputated 2nd toe  . Hypothyroid     Past Surgical History  Procedure Laterality Date  . Elbow bursa surgery  04/07/2006    Right elbow septic olecranon bursitis  . Toe amputation  ~ 2009    Osteomyelitis, right foot second  toe /  Right foot second ray amputation  . Cardiac catheterization  07/03/2002    EF 60-70% /  Normal left ventricular function. / Very mild trivial three-vessel coronary atherosclerosis. / There is no mitral regurgitation noted  . Tonsillectomy and adenoidectomy      "when I was a youngster"  . Appendectomy      "when I was a youngster"  . Inguinal hernia repair  ~ 2003/13    right  . Cataract extraction w/ intraocular lens  implant, bilateral  01/2005      Medication List       This list is accurate as of: 09/03/13  8:46 PM.  Always use your most recent med list.               collagenase ointment  Commonly known as:  SANTYL  Apply topically daily.     digoxin 0.125 MG tablet  Commonly known as:  LANOXIN  Take 1 tablet (125 mcg total) by mouth daily.     dorzolamide-timolol 22.3-6.8 MG/ML ophthalmic solution  Commonly known as:  COSOPT  Place 1 drop into both eyes daily.     feeding supplement (ENSURE) Pudg  Take 1 Container by mouth 2 (two) times daily between meals.  feeding supplement (PRO-STAT SUGAR FREE 64) Liqd  Take 30 mLs by mouth 3 (three) times daily with meals.     finasteride 5 MG tablet  Commonly known as:  PROSCAR  Take 5 mg by mouth daily.     furosemide 20 MG tablet  Commonly known as:  LASIX  Take 1 tablet (20 mg total) by mouth every other day.     levofloxacin 750 MG tablet  Commonly known as:  LEVAQUIN  Take 1 tablet (750 mg total) by mouth every other day.     levothyroxine 150 MCG tablet  Commonly known as:  SYNTHROID, LEVOTHROID  Take 150 mcg by mouth daily before breakfast.     meclizine 25 MG tablet  Commonly known as:  ANTIVERT  Take 25 mg by mouth 3 (three) times daily as needed for dizziness.     midodrine 10 MG tablet  Commonly known as:  PROAMATINE  Take 10 mg by mouth 3 (three) times daily.     multivitamin with minerals Tabs tablet  Take 1 tablet by mouth daily.     pyridostigmine 60 MG tablet  Commonly known as:   MESTINON  Take 60 mg by mouth 3 (three) times daily. Breakfast lunch and dinner     traMADol 50 MG tablet  Commonly known as:  ULTRAM  Take one tablet by mouth every 12 hours as needed for moderate pain        No orders of the defined types were placed in this encounter.    Immunization History  Administered Date(s) Administered  . Influenza,inj,Quad PF,36+ Mos 07/04/2013  . Influenza-Unspecified 07/04/2013    History  Substance Use Topics  . Smoking status: Former Smoker -- 1.00 packs/day for 25 years    Types: Cigarettes    Quit date: 08/09/1965  . Smokeless tobacco: Never Used  . Alcohol Use: No     Comment: "last alcohol ~ 2008"    Review of Systems  DATA OBTAINED: from patient and daughters to [place]: pt says all of a sudden food tastes good-he wants to eat ! Daughters say he is eating everything from SNF AND eating food brought in , milk shakes, ice cream GENERAL: Feels well no fevers, fatigue, appetite improved SKIN: No itching, rash HEENT: No complaint RESPIRATORY: + cough, wheezing, SOB CARDIAC: No chest pain, palpitations, lower extremity edema  GI: No abdominal pain, No N/V/D or constipation, No heartburn or reflux  GU: No dysuria, frequency or urgency, or incontinence  MUSCULOSKELETAL: No unrelieved bone/joint pain NEUROLOGIC: No headache, dizziness or focal weakness PSYCHIATRIC: No overt anxiety . Sleeps OK; " I miss her."  Filed Vitals:   09/03/13 2044  BP: 104/58  Pulse: 64  Temp: 97.8 F (36.6 C)  Resp: 20    Physical Exam  GENERAL APPEARANCE: Alert, conversant. Appropriately groomed. No acute distress; this pt has come to life; so improved  SKIN: No diaphoresis rash; has a sacral wound;did not observe it HEENT: Unremarkable RESPIRATORY: Breathing is even, unlabored. Lung sounds are clear rhonchorus but full, no wheeze ;wet cough but a good cough CARDIOVASCULAR: Heart RRR no murmurs, rubs or gallops. No peripheral edema  GASTROINTESTINAL:  Abdomen is soft, non-tender, not distended w/ normal bowel sounds.  GENITOURINARY: Bladder non tender, not distended  MUSCULOSKELETAL: No abnormal joints or musculature NEUROLOGIC: Cranial nerves 2-12 grossly intact. Moves all extremities no tremor. PSYCHIATRIC: Mood and affect appropriate to situation  Patient Active Problem List   Diagnosis Date Noted  . Ulcer of toe of  right foot 08/13/2013  . Unspecified hypothyroidism 08/04/2013  . Foley catheter in place 08/04/2013  . HCAP (healthcare-associated pneumonia) 08/03/2013  . Chest pain   . Acute kidney injury 07/25/2013  . Acute renal failure 07/24/2013  . Decubitus ulcer of sacral region, stage 3 07/03/2013  . Acute exacerbation of CHF (congestive heart failure) 07/03/2013  . Fall at home 07/03/2013  . Skin ulcer 06/26/2013  . UTI (urinary tract infection) 06/26/2013  . PAF (paroxysmal atrial fibrillation) 08/07/2012  . Long-term (current) use of anticoagulants   . Chronic diastolic heart failure 04/19/2012  . Orthostasis 04/19/2012  . Myasthenia gravis 04/19/2012  . Pancreatic mass 04/19/2012  . COPD exacerbation 03/10/2012  . Overactive bladder 04/05/2011  . Atrial fibrillation 12/03/2008  . DYSAUTONOMIA 12/03/2008  . 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 OBSTRUCTIVE SLEEP APNEA 11/24/2007    CBC    Component Value Date/Time   WBC 11.9* 07/30/2013 0640   WBC 7.5 06/07/2012 1607   RBC 3.94* 07/30/2013 0640   RBC 4.52* 06/07/2012 1607   HGB 11.9* 07/30/2013 0640   HGB 13.7* 06/07/2012 1607   HCT 37.7* 07/30/2013 0640   HCT 45.2 06/07/2012 1607   PLT 218 07/30/2013 0640   MCV 95.7 07/30/2013 0640   MCV 99.9* 06/07/2012 1607   LYMPHSABS 1.3 07/29/2013 0441   MONOABS 0.8 07/29/2013 0441   EOSABS 0.1 07/29/2013 0441   BASOSABS 0.0 07/29/2013 0441    CMP     Component Value Date/Time   NA 143 07/30/2013 0640   K 4.4 07/30/2013 0640   CL 107 07/30/2013 0640   CO2 31 07/30/2013 0640   GLUCOSE 117* 07/30/2013 0640   BUN  49* 07/30/2013 0640   CREATININE 1.20 07/30/2013 0640   CREATININE 1.06 06/07/2012 1603   CALCIUM 9.0 07/30/2013 0640   PROT 6.4 07/03/2013 0615   ALBUMIN 2.7* 07/03/2013 0615   AST 22 07/03/2013 0615   ALT 14 07/03/2013 0615   ALKPHOS 84 07/03/2013 0615   BILITOT 0.9 07/03/2013 0615   GFRNONAA 51* 07/30/2013 0640   GFRAA 60* 07/30/2013 0640    Assessment and Plan  WEIGHT GAIN - I think the majority of weight gain is real, not fluid; will give an extra dose of lasix (he gets it QOD); I much more afraid of his getting dry  FAILURE TO THRIVE- turning around before our eyes;may be real, may be the last rally but it is satisfying;PT has evaluated his change in status and he will start working with him  DISAUTONIA - a big problem and impedement to rehab;there is a new drug on the market I need to research  Margit Hanks, MD

## 2013-09-06 ENCOUNTER — Ambulatory Visit: Payer: Medicare Other | Admitting: Cardiology

## 2013-09-19 ENCOUNTER — Ambulatory Visit (INDEPENDENT_AMBULATORY_CARE_PROVIDER_SITE_OTHER): Payer: Medicare Other | Admitting: Cardiology

## 2013-09-19 ENCOUNTER — Encounter: Payer: Self-pay | Admitting: Internal Medicine

## 2013-09-19 ENCOUNTER — Encounter: Payer: Self-pay | Admitting: Cardiology

## 2013-09-19 VITALS — BP 114/58 | HR 60 | Ht 75.0 in

## 2013-09-19 DIAGNOSIS — Z9181 History of falling: Secondary | ICD-10-CM

## 2013-09-19 DIAGNOSIS — I951 Orthostatic hypotension: Secondary | ICD-10-CM

## 2013-09-19 DIAGNOSIS — R296 Repeated falls: Secondary | ICD-10-CM

## 2013-09-19 DIAGNOSIS — Z950 Presence of cardiac pacemaker: Secondary | ICD-10-CM

## 2013-09-19 DIAGNOSIS — I495 Sick sinus syndrome: Secondary | ICD-10-CM

## 2013-09-19 DIAGNOSIS — I4891 Unspecified atrial fibrillation: Secondary | ICD-10-CM

## 2013-09-19 NOTE — Patient Instructions (Signed)
Your physician recommends that you schedule a follow-up appointment in: 4 WEEKS WITH DR. Patty SermonsBRACKBILL (DIGOXIN F/U)  Your physician has recommended you make the following change in your medication:   DISCONTINUE YOUR DIGOXIN

## 2013-09-20 ENCOUNTER — Other Ambulatory Visit: Payer: Self-pay | Admitting: Orthopaedic Surgery

## 2013-09-20 DIAGNOSIS — M545 Low back pain, unspecified: Secondary | ICD-10-CM

## 2013-09-20 LAB — MDC_IDC_ENUM_SESS_TYPE_INCLINIC
Brady Statistic RA Percent Paced: 0 %
Brady Statistic RV Percent Paced: 76 %
Date Time Interrogation Session: 20150211203522
Implantable Pulse Generator Model: 2210
Implantable Pulse Generator Serial Number: 7268137
Lead Channel Impedance Value: 440 Ohm
Lead Channel Pacing Threshold Amplitude: 1 V
Lead Channel Pacing Threshold Pulse Width: 0.4 ms
Lead Channel Sensing Intrinsic Amplitude: 10.1 mV
Lead Channel Setting Pacing Amplitude: 2.5 V
Lead Channel Setting Sensing Sensitivity: 2 mV
MDC IDC MSMT BATTERY REMAINING LONGEVITY: 61.2 mo
MDC IDC MSMT BATTERY VOLTAGE: 2.92 V
MDC IDC MSMT LEADCHNL RA IMPEDANCE VALUE: 350 Ohm
MDC IDC MSMT LEADCHNL RA SENSING INTR AMPL: 0.5 mV
MDC IDC SET LEADCHNL RA PACING AMPLITUDE: 3.5 V
MDC IDC SET LEADCHNL RV PACING PULSEWIDTH: 0.4 ms

## 2013-09-20 NOTE — Progress Notes (Signed)
Patient ID: Marco Gregory MRN: [id number], DOB/AGE: 78-Aug-1922   Date of Visit: 09/19/2013  Primary Physician: Elvina Sidle, MD Primary Cardiologist: Patty Sermons, MD Primary EP: Graciela Husbands, MD Neurology: Avie Echevaria, MD Neurosurgery: Barnett Abu, MD Reason for Visit: EP/device follow-up  History of Present Illness  Marco Gregory is a 80 y.o. male with tachy-brady syndrome s/p PPM implant, atrial fibrillation, mild aortic stenosis, myasthenia gravis, orthostatic hypotension and frequent falls (not on anticoagulation due to frequent falls and significant epistaxis; was prescribed Eliquis but refused to take due to cost) who presents today for routine electrophysiology followup. He was last seen by Dr. Graciela Husbands in Nov 2013. He has been followed routinely by Dr. Patty Sermons. He resides at [facility] and today is accompanied by his daughter who assists with history.    Since last being seen in our clinic, he reports he is doing about the same. He is recovering from pneumonia and ARF secondary to obstructive uropathy with BPH and urinary retention. Sadly, his wife recently passed away. He continues to have significant hypotension with falls. It interferes with his ability to participate in rehab. He denies chest pain or shortness of breath. He denies palpitations, dizziness, near syncope or syncope. He denies LE swelling, orthopnea or PND.   Past Medical History Past Medical History  Diagnosis Date  . Long-term (current) use of anticoagulants   . Glaucoma   . Gout   . Hyperthyroidism   . Orthostatic hypotension   . Redundant prepuce and phimosis   . Acute cystitis   . Hypertonicity of bladder   . HTN (hypertension)   . Atelectasis   . Dysautonomia   . Pacemaker- st [employer]     DOI 2012  . CHF (congestive heart failure)     Previous preserved EF.  [name] Atrial fibrillation   . Sinus node dysfunction   . Pneumonia 08/2011    after hernia OR  . Hypothyroidism   . Sleep apnea     does  not use cpap ; last used ~ 2011 (03/10/12)  . Spinal stenosis of lumbar region   . Chronic kidney disease [facility]     UTI  1 MO AGO TX MEDICALLY   . Osteomyelitis of toe of right foot ~ 2009    amputated 2nd toe  . Hypothyroid     Past Surgical History Past Surgical History  Procedure Laterality Date  . Elbow bursa surgery  04/07/2006    Right elbow septic olecranon bursitis  . Toe amputation  ~ 2009    Osteomyelitis, right foot second toe /  Right foot second ray amputation  . Cardiac catheterization  07/03/2002    EF 60-70% /  Normal left ventricular function. / Very mild trivial three-vessel coronary atherosclerosis. / There is no mitral regurgitation noted  . Tonsillectomy and adenoidectomy      "when I was a youngster"  . Appendectomy      "when I was a youngster"  . Inguinal hernia repair  ~ 2003/13    right  . Cataract extraction w/ intraocular lens  implant, bilateral  01/2005    Allergies/Intolerances No Known Allergies  Current Home Medications Current Outpatient Prescriptions  Medication Sig Dispense Refill  . acetaminophen (TYLENOL EX ST ARTHRITIS PAIN) 500 MG tablet Take 500 mg by mouth 3 (three) times daily.      . Amino Acids-Protein Hydrolys (FEEDING SUPPLEMENT, PRO-STAT SUGAR FREE 64,) LIQD Take 30 mLs by mouth 3 (three) times daily with meals.  900 mL  0  .  collagenase (SANTYL) ointment Apply topically daily.  15 g  0  . dorzolamide-timolol (COSOPT) 22.3-6.8 MG/ML ophthalmic solution Place 1 drop into both eyes daily.       . feeding supplement, ENSURE, (ENSURE) PUDG Take 1 Container by mouth 2 (two) times daily between meals.  60 Container  0  . finasteride (PROSCAR) 5 MG tablet Take 5 mg by mouth daily.       . furosemide (LASIX) 20 MG tablet Take 1 tablet (20 mg total) by mouth every other day.  15 tablet  0  . levothyroxine (SYNTHROID, LEVOTHROID) 150 MCG tablet Take 150 mcg by mouth daily before breakfast.      . Magnesium Hydroxide (MILK OF  MAGNESIA PO) Take by mouth as needed.      . meclizine (ANTIVERT) 25 MG tablet Take 25 mg by mouth 3 (three) times daily as needed for dizziness.      . midodrine (PROAMATINE) 10 MG tablet Take 10 mg by mouth 3 (three) times daily.       . mirtazapine (REMERON) 15 MG tablet Take 15 mg by mouth at bedtime.      . Multiple Vitamin (MULTIVITAMIN WITH MINERALS) TABS Take 1 tablet by mouth daily.      [name] pyridostigmine (MESTINON) 60 MG tablet Take 60 mg by mouth 3 (three) times daily. Breakfast lunch and dinner      . saccharomyces boulardii (FLORASTOR) 250 MG capsule Take 250 mg by mouth 2 (two) times daily.      . traMADol (ULTRAM) 50 MG tablet Take one tablet by mouth every 12 hours as needed for moderate pain  60 tablet  5   No current facility-administered medications for this visit.    Social History History   Social History  . Marital Status: Married    Spouse Name: N/A    Number of Children: 2  . Years of Education: N/A   Occupational History  . Retired    Social History Main Topics  . Smoking status: Former Smoker -- 1.00 packs/day for 25 years    Types: Cigarettes    Quit date: 08/09/1965  . Smokeless tobacco: Never Used  . Alcohol Use: No     Comment: "last alcohol ~ 2008"  . Drug Use: No  . Sexual Activity: No   Other Topics Concern  . Not on file   Social History Narrative  . No narrative on file     Review of Systems General: No chills, fever, night sweats or weight changes Cardiovascular: No chest pain, dyspnea, edema, orthopnea, palpitations, paroxysmal nocturnal dyspnea Respiratory: No cough, dyspnea Abdominal: No nausea, vomiting, diarrhea, bright red blood per rectum, melena, or hematemesis Neurologic: No visual changes, changes in mental status All other systems reviewed and are otherwise negative except as noted above.  Physical Exam Vitals: Blood pressure 114/58, pulse 60, height 6\' 3"  (1.905 m), weight 0 lb (0 kg), SpO2 91.00%.  General: Well  developed, chronically ill appearing 80 y.o. male in no acute distress. Examined on EMS stretcher. HEENT: Normocephalic, atraumatic. EOMs intact. Sclera nonicteric. Oropharynx clear.  Neck: Supple. No JVD. Lungs: Respirations regular and unlabored, CTA bilaterally anteriorly. No wheezes, rales or rhonchi. Heart: Irregular. S1, S2 present. No murmurs, rub, S3 or S4. Abdomen: Soft, non-distended. BS present x 4 quadrants. Extremities: No clubbing, cyanosis or edema.  Psych: Normal affect. Neuro: Alert and oriented X 3. Moves all extremities spontaneously.   Diagnostics Recent labs BMET    Component Value Date/Time   NA  143 07/30/2013 0640   K 4.4 07/30/2013 0640   CL 107 07/30/2013 0640   CO2 31 07/30/2013 0640   GLUCOSE 117* 07/30/2013 0640   BUN 49* 07/30/2013 0640   CREATININE 1.20 07/30/2013 0640   CREATININE 1.06 06/07/2012 1603   CALCIUM 9.0 07/30/2013 0640   GFRNONAA 51* 07/30/2013 0640   GFRAA 60* 07/30/2013 0640  Digoxin level 1.2  Device interrogation today -  Normal device function. Thresholds, sensing, impedances consistent with previous measurements. In AFib 100% of time since Nov 2013. Not anticoagulated due to frequent falls. No high ventricular rates noted. Device programmed at appropriate safety margins. Histogram distribution appropriate for patient activity level. Device programmed to optimize intrinsic conduction. Device programmed to maximize longevity. RV auto capture out of range due to AFib so RV auto capture turned off. Otherwise no programming changes made. Estimated longevity 3.8 - 5.1 years.   Assessment and Plan  1. Tachy-brady syndrome s/p PPM implant - normal device function - return for follow-up with Dr. Graciela HusbandsKlein in 6 months  2. Atrial fibrillation - this appears to be permanent as his device interrogation today shows AF 100% of time since Nov 2013 - rate controlled - digoxin level too high at 1.2 in Dec 2014, dose decreased at that time; now with  advanced age and renal dysfunction, will discontinue altogether (discussed today with Dr. Patty SermonsBrackbill) - not anticoagulated due to falls and prior bleeding   3. Orthostatic hypotension - continue current regimen as per Dr. Patty SermonsBrackbill - return for follow-up with Dr. Patty SermonsBrackbill as scheduled in March 2015  Signed, Rick DuffDMISTEN, Bryson Gavia, PA-C 09/20/2013, 4:25 PM

## 2013-09-28 ENCOUNTER — Ambulatory Visit
Admission: RE | Admit: 2013-09-28 | Discharge: 2013-09-28 | Disposition: A | Discharge status: Home / Self Care | Payer: Medicare Other | Source: Ambulatory Visit | Attending: Orthopaedic Surgery | Admitting: Orthopaedic Surgery

## 2013-09-28 DIAGNOSIS — M545 Low back pain, unspecified: Secondary | ICD-10-CM

## 2013-10-04 ENCOUNTER — Non-Acute Institutional Stay (SKILLED_NURSING_FACILITY): Payer: Medicare Other | Admitting: Internal Medicine

## 2013-10-04 DIAGNOSIS — M48061 Spinal stenosis, lumbar region without neurogenic claudication: Secondary | ICD-10-CM

## 2013-10-04 DIAGNOSIS — L8993 Pressure ulcer of unspecified site, stage 3: Secondary | ICD-10-CM

## 2013-10-04 DIAGNOSIS — L89153 Pressure ulcer of sacral region, stage 3: Secondary | ICD-10-CM

## 2013-10-04 DIAGNOSIS — H109 Unspecified conjunctivitis: Secondary | ICD-10-CM

## 2013-10-04 DIAGNOSIS — L89109 Pressure ulcer of unspecified part of back, unspecified stage: Secondary | ICD-10-CM

## 2013-10-07 ENCOUNTER — Encounter: Payer: Self-pay | Admitting: Internal Medicine

## 2013-10-07 DIAGNOSIS — M48061 Spinal stenosis, lumbar region without neurogenic claudication: Secondary | ICD-10-CM | POA: Insufficient documentation

## 2013-10-07 NOTE — Progress Notes (Signed)
MRN: [id number] Name: Marco Gregory  Sex: male Age: 80 y.o. DOB: 27-Nov-1920  PSC #: [name] Facility/Room: 309 Level Of Care: SNF Provider: Merrilee Seashore D Emergency Contacts: Extended Emergency Contact Information Primary Emergency Contact: Jerde-Edmonds,Joyce Address: [address]           [place], [place] [address] [place] Home Phone: [phone] Mobile Phone: [phone] Relation: Daughter Secondary Emergency Contact: Phineas Douglas [place] Home Phone: [phone] Relation: Daughter  Code Status: DNR  Allergies: Review of patient's allergies indicates no known allergies.  Chief Complaint  Patient presents with  . Medical Managment of Chronic Issues    HPI: Patient is 80 y.o. male who is being seen for routine problems.  Past Medical History  Diagnosis Date  . Long-term (current) use of anticoagulants   . Glaucoma   . Gout   . Hyperthyroidism   . Orthostatic hypotension   . Redundant prepuce and phimosis   . Acute cystitis   . Hypertonicity of bladder   . HTN (hypertension)   . Atelectasis   . Dysautonomia   . Pacemaker- [place]     DOI 2012  . CHF (congestive heart failure)     Previous preserved EF.  [name] Atrial fibrillation   . Sinus node dysfunction   . Pneumonia 08/2011    after hernia OR  . Hypothyroidism   . Sleep apnea     does not use cpap ; last used ~ 2011 (03/10/12)  . Spinal stenosis of lumbar region   . Chronic kidney disease [facility]     UTI  1 MO AGO TX MEDICALLY   . Osteomyelitis of toe of right foot ~ 2009    amputated 2nd toe  . Hypothyroid     Past Surgical History  Procedure Laterality Date  . Elbow bursa surgery  04/07/2006    Right elbow septic olecranon bursitis  . Toe amputation  ~ 2009    Osteomyelitis, right foot second toe /  Right foot second ray amputation  . Cardiac catheterization  07/03/2002    EF 60-70% /  Normal left ventricular function. / Very mild trivial  three-vessel coronary atherosclerosis. / There is no mitral regurgitation noted  . Tonsillectomy and adenoidectomy      "when I was a youngster"  . Appendectomy      "when I was a youngster"  . Inguinal hernia repair  ~ 2003/13    right  . Cataract extraction w/ intraocular lens  implant, bilateral  01/2005      Medication List       This list is accurate as of: 10/04/13 11:59 PM.  Always use your most recent med list.               collagenase ointment  Commonly known as:  SANTYL  Apply topically daily.     dorzolamide-timolol 22.3-6.8 MG/ML ophthalmic solution  Commonly known as:  COSOPT  Place 1 drop into both eyes daily.     feeding supplement (ENSURE) Pudg  Take 1 Container by mouth 2 (two) times daily between meals.     feeding supplement (PRO-STAT SUGAR FREE 64) Liqd  Take 30 mLs by mouth 3 (three) times daily with meals.     finasteride 5 MG tablet  Commonly known as:  PROSCAR  Take 5 mg by mouth daily.     furosemide 20 MG tablet  Commonly known as:  LASIX  Take 1 tablet (20 mg total) by mouth every  other day.     levothyroxine 150 MCG tablet  Commonly known as:  SYNTHROID, LEVOTHROID  Take 150 mcg by mouth daily before breakfast.     meclizine 25 MG tablet  Commonly known as:  ANTIVERT  Take 25 mg by mouth 3 (three) times daily as needed for dizziness.     midodrine 10 MG tablet  Commonly known as:  PROAMATINE  Take 10 mg by mouth 3 (three) times daily.     MILK OF MAGNESIA PO  Take by mouth as needed.     mirtazapine 15 MG tablet  Commonly known as:  REMERON  Take 15 mg by mouth at bedtime.     multivitamin with minerals Tabs tablet  Take 1 tablet by mouth daily.     pyridostigmine 60 MG tablet  Commonly known as:  MESTINON  Take 60 mg by mouth 3 (three) times daily. Breakfast lunch and dinner     saccharomyces boulardii 250 MG capsule  Commonly known as:  FLORASTOR  Take 250 mg by mouth 2 (two) times daily.     traMADol 50 MG tablet   Commonly known as:  ULTRAM  Take one tablet by mouth every 12 hours as needed for moderate pain     TYLENOL EX ST ARTHRITIS PAIN 500 MG tablet  Generic drug:  acetaminophen  Take 500 mg by mouth 3 (three) times daily.        No orders of the defined types were placed in this encounter.    Immunization History  Administered Date(s) Administered  . Influenza,inj,Quad PF,36+ Mos 07/04/2013  . Influenza-Unspecified 07/04/2013    History  Substance Use Topics  . Smoking status: Former Smoker -- 1.00 packs/day for 25 years    Types: Cigarettes    Quit date: 08/09/1965  . Smokeless tobacco: Never Used  . Alcohol Use: No     Comment: "last alcohol ~ 2008"    Review of Systems  DATA OBTAINED: from patient, family member GENERAL: Feels well no fevers, fatigue, appetite changes SKIN: No itching, rash HEENT: No complaint RESPIRATORY: No cough, wheezing, SOB CARDIAC: No chest pain, palpitations, lower extremity edema  GI: No abdominal pain, No N/V/D or constipation, No heartburn or reflux  GU: No dysuria, frequency or urgency, or incontinence  MUSCULOSKELETAL: continued low back pain and pain in sacrum  NEUROLOGIC: No headache, dizziness or focal weakness PSYCHIATRIC: No overt anxiety or sadness. Sleeps well.   Filed Vitals:   10/03/13 1239  BP: 101/64  Pulse: 83  Temp: 97 F (36.1 C)  Resp: 20    Physical Exam  GENERAL APPEARANCE: Alert, conversant. Appropriately groomed. No acute distress ; pt looks really good SKIN: No diaphoresis rash;sacral wound dressed and not examined HEENT: L eye with mod red conjunctiva, mild swell lower lid RESPIRATORY: Breathing is even, unlabored. Lung sounds are clear   CARDIOVASCULAR: Heart RRR no murmurs, rubs or gallops. No peripheral edema  GASTROINTESTINAL: Abdomen is soft, non-tender, not distended w/ normal bowel sounds.  GENITOURINARY: Bladder non tender, not distended  MUSCULOSKELETAL: No abnormal joints or  musculature NEUROLOGIC: Cranial nerves 2-12 grossly intact. Moves all extremities no tremor. PSYCHIATRIC: Mood and affect appropriate to situation, no behavioral issues  Patient Active Problem List   Diagnosis Date Noted  . Spinal stenosis of lumbar region   . Ulcer of toe of right foot 08/13/2013  . Unspecified hypothyroidism 08/04/2013  . Foley catheter in place 08/04/2013  . HCAP (healthcare-associated pneumonia) 08/03/2013  . Chest pain   .  Acute kidney injury 07/25/2013  . Acute renal failure 07/24/2013  . Decubitus ulcer of sacral region, stage 3 07/03/2013  . Acute exacerbation of CHF (congestive heart failure) 07/03/2013  . Fall at home 07/03/2013  . Skin ulcer 06/26/2013  . UTI (urinary tract infection) 06/26/2013  . PAF (paroxysmal atrial fibrillation) 08/07/2012  . Long-term (current) use of anticoagulants   . Chronic diastolic heart failure 04/19/2012  . Orthostasis 04/19/2012  . Myasthenia gravis 04/19/2012  . Pancreatic mass 04/19/2012  . COPD exacerbation 03/10/2012  . Overactive bladder 04/05/2011  . Atrial fibrillation 12/03/2008  . DYSAUTONOMIA 12/03/2008  . 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 OBSTRUCTIVE SLEEP APNEA 11/24/2007    CBC    Component Value Date/Time   WBC 11.9* 07/30/2013 0640   WBC 7.5 06/07/2012 1607   RBC 3.94* 07/30/2013 0640   RBC 4.52* 06/07/2012 1607   HGB 11.9* 07/30/2013 0640   HGB 13.7* 06/07/2012 1607   HCT 37.7* 07/30/2013 0640   HCT 45.2 06/07/2012 1607   PLT 218 07/30/2013 0640   MCV 95.7 07/30/2013 0640   MCV 99.9* 06/07/2012 1607   LYMPHSABS 1.3 07/29/2013 0441   MONOABS 0.8 07/29/2013 0441   EOSABS 0.1 07/29/2013 0441   BASOSABS 0.0 07/29/2013 0441    CMP     Component Value Date/Time   NA 143 07/30/2013 0640   K 4.4 07/30/2013 0640   CL 107 07/30/2013 0640   CO2 31 07/30/2013 0640   GLUCOSE 117* 07/30/2013 0640   BUN 49* 07/30/2013 0640   CREATININE 1.20 07/30/2013 0640   CREATININE 1.06 06/07/2012 1603   CALCIUM  9.0 07/30/2013 0640   PROT 6.4 07/03/2013 0615   ALBUMIN 2.7* 07/03/2013 0615   AST 22 07/03/2013 0615   ALT 14 07/03/2013 0615   ALKPHOS 84 07/03/2013 0615   BILITOT 0.9 07/03/2013 0615   GFRNONAA 51* 07/30/2013 0640   GFRAA 60* 07/30/2013 0640    Assessment and Plan  Spinal stenosis of lumbar region Pt has had low back pain for years and recent CT showed some progression of that dx;for pain suggest that we TRY lidocaine patches to see if they offer relief; CT  also showed some inflammatory changes in the peri-rectal area that I think are due to pt's stage 4 sacral decubitus ulcer and pt's daughter was very happy to hear that  Decubitus ulcer of sacral region, stage 3 Grew out MRSA; wound care is following;source of inflammatory changes on CT; pt has pain when he sits up;daughter request d/c tramadol because it's making Dad act funny   CONJUNCTIVITIS- pt admits some itching and d/c in am;garamycin drops QID for 7 days  Margit HanksALEXANDER, Adrena Nakamura D, MD

## 2013-10-07 NOTE — Assessment & Plan Note (Addendum)
Pt has had low back pain for years and recent CT showed some progression of that dx;for pain suggest that we TRY lidocaine patches to see if they offer relief; CT  also showed some inflammatory changes in the peri-rectal area that I think are due to pt's stage 4 sacral decubitus ulcer and pt's daughter was very happy to hear that

## 2013-10-07 NOTE — Assessment & Plan Note (Signed)
Grew out MRSA; wound care is following;source of inflammatory changes on CT; pt has pain when he sits up;daughter request d/c tramadol because it's making Dad act funny

## 2013-10-17 ENCOUNTER — Ambulatory Visit: Payer: Medicare Other | Admitting: Cardiology

## 2013-10-26 ENCOUNTER — Non-Acute Institutional Stay (SKILLED_NURSING_FACILITY): Payer: Medicare Other | Admitting: Nurse Practitioner

## 2013-10-26 ENCOUNTER — Encounter: Payer: Self-pay | Admitting: Nurse Practitioner

## 2013-10-26 DIAGNOSIS — L8993 Pressure ulcer of unspecified site, stage 3: Secondary | ICD-10-CM

## 2013-10-26 DIAGNOSIS — H109 Unspecified conjunctivitis: Secondary | ICD-10-CM

## 2013-10-26 DIAGNOSIS — L89153 Pressure ulcer of sacral region, stage 3: Secondary | ICD-10-CM

## 2013-10-26 DIAGNOSIS — N400 Enlarged prostate without lower urinary tract symptoms: Secondary | ICD-10-CM

## 2013-10-26 DIAGNOSIS — R627 Adult failure to thrive: Secondary | ICD-10-CM

## 2013-10-26 DIAGNOSIS — I951 Orthostatic hypotension: Secondary | ICD-10-CM

## 2013-10-26 DIAGNOSIS — I5032 Chronic diastolic (congestive) heart failure: Secondary | ICD-10-CM

## 2013-10-26 DIAGNOSIS — I4891 Unspecified atrial fibrillation: Secondary | ICD-10-CM

## 2013-10-26 DIAGNOSIS — M48061 Spinal stenosis, lumbar region without neurogenic claudication: Secondary | ICD-10-CM

## 2013-10-26 DIAGNOSIS — Z978 Presence of other specified devices: Secondary | ICD-10-CM

## 2013-10-26 DIAGNOSIS — Z9889 Other specified postprocedural states: Secondary | ICD-10-CM

## 2013-10-26 DIAGNOSIS — Z960 Presence of urogenital implants: Secondary | ICD-10-CM

## 2013-10-26 DIAGNOSIS — G7000 Myasthenia gravis without (acute) exacerbation: Secondary | ICD-10-CM

## 2013-10-26 DIAGNOSIS — L89109 Pressure ulcer of unspecified part of back, unspecified stage: Secondary | ICD-10-CM

## 2013-10-26 NOTE — Progress Notes (Signed)
Patient ID: Marco Gregory, male   DOB: 1921/08/08, 80 y.o.   MRN: [id number]    Nursing Home Location:  [facility]   Place of Service: SNF (24)  PCP: Robyn Haber, MD  No Known Allergies  Chief Complaint  Patient presents with  . Medical Managment of Chronic Issues    HPI:  Marco Gregory is a 80 y.o. male with tachy-brady syndrome s/p PPM implant, atrial fibrillation, mild aortic stenosis, myasthenia gravis, orthostatic hypotension and frequent falls (not on anticoagulation due to frequent falls and significant epistaxis) who is at [facility] after significant debility from  pneumonia and ARF secondary to obstructive uropathy with BPH and urinary retention.who is now on palliative care services; pt continued to have significant hypotension with instability and lack of progress in therapy therefore was dc'd from rehab services but still involved in respite care. He denies palpitations, dizziness, near syncope or syncope. He denies LE swelling, orthopnea or PND but reports back pain and ongoing weakness. Has had an increase in PO intake with weight gain and reports he is trying to get himself stronger and is not satisfied with not being on therapy.  Staff without concerns at this time. Pt alert and oriented and seems motivated at this time.   Review of Systems:  Review of Systems  Constitutional: Negative for fever, chills, weight loss and malaise/fatigue.  HENT: Negative for congestion and sore throat.   Respiratory: Positive for cough. Negative for sputum production, shortness of breath and wheezing.   Cardiovascular: Negative for chest pain and leg swelling.  Gastrointestinal: Negative for heartburn, abdominal pain, diarrhea and constipation.  Genitourinary: Negative for hematuria.       Foley catheter   Musculoskeletal: Negative for joint pain and myalgias.  Skin:       Wound on sacrum  Neurological: Positive for weakness. Negative for headaches.  Dizziness: with changing positions.  Psychiatric/Behavioral: Negative for depression. The patient is not nervous/anxious.      Past Medical History  Diagnosis Date  . Long-term (current) use of anticoagulants   . Glaucoma   . Gout   . Hyperthyroidism   . Orthostatic hypotension   . Redundant prepuce and phimosis   . Acute cystitis   . Hypertonicity of bladder   . HTN (hypertension)   . Atelectasis   . Dysautonomia   . Pacemaker- [place] 2012  . CHF (congestive heart failure)     Previous preserved EF.  [name] Atrial fibrillation   . Sinus node dysfunction   . Pneumonia 08/2011    after hernia OR  . Hypothyroidism   . Sleep apnea     does not use cpap ; last used ~ 2011 (03/10/12)  . Spinal stenosis of lumbar region   . Chronic kidney disease [facility]     UTI  1 MO AGO TX MEDICALLY   . Osteomyelitis of toe of right foot ~ 2009    amputated 2nd toe  . Hypothyroid    Past Surgical History  Procedure Laterality Date  . Elbow bursa surgery  04/07/2006    Right elbow septic olecranon bursitis  . Toe amputation  ~ 2009    Osteomyelitis, right foot second toe /  Right foot second ray amputation  . Cardiac catheterization  07/03/2002    EF 60-70% /  Normal left ventricular function. / Very mild trivial three-vessel coronary atherosclerosis. / There is no mitral regurgitation noted  . Tonsillectomy and adenoidectomy      "  when I was a youngster"  . Appendectomy      "when I was a youngster"  . Inguinal hernia repair  ~ 2003/13    right  . Cataract extraction w/ intraocular lens  implant, bilateral  01/2005   Social History:   reports that he quit smoking about 48 years ago. His smoking use included Cigarettes. He has a 25 pack-year smoking history. He has never used smokeless tobacco. He reports that he does not drink alcohol or use illicit drugs.  Family History  Problem Relation Age of Onset  . Cancer Mother 44    GYN    Medications: Patient's Medications   New Prescriptions   No medications on file  Previous Medications   ACETAMINOPHEN (TYLENOL EX ST ARTHRITIS PAIN) 500 MG TABLET    Take 500 mg by mouth 3 (three) times daily.   AMINO ACIDS-PROTEIN HYDROLYS (FEEDING SUPPLEMENT, PRO-STAT SUGAR FREE 64,) LIQD    Take 30 mLs by mouth 3 (three) times daily with meals.   COLLAGENASE (SANTYL) OINTMENT    Apply topically daily.   DORZOLAMIDE-TIMOLOL (COSOPT) 22.3-6.8 MG/ML OPHTHALMIC SOLUTION    Place 1 drop into both eyes daily.    FEEDING SUPPLEMENT, ENSURE, (ENSURE) PUDG    Take 1 Container by mouth 2 (two) times daily between meals.   FINASTERIDE (PROSCAR) 5 MG TABLET    Take 5 mg by mouth daily.    FUROSEMIDE (LASIX) 20 MG TABLET    Take 1 tablet (20 mg total) by mouth every other day.   LEVOTHYROXINE (SYNTHROID, LEVOTHROID) 150 MCG TABLET    Take 150 mcg by mouth daily before breakfast.   LIDOCAINE (LIDODERM) 5 %    Place 1 patch onto the skin daily. Remove & Discard patch within 12 hours or as directed by MD   MAGNESIUM HYDROXIDE (MILK OF MAGNESIA PO)    Take by mouth as needed.   MECLIZINE (ANTIVERT) 25 MG TABLET    Take 25 mg by mouth 3 (three) times daily as needed for dizziness.   MIDODRINE (PROAMATINE) 10 MG TABLET    Take 10 mg by mouth 3 (three) times daily.    MIRTAZAPINE (REMERON) 15 MG TABLET    Take 15 mg by mouth at bedtime.   MULTIPLE VITAMIN (MULTIVITAMIN WITH MINERALS) TABS    Take 1 tablet by mouth daily.   PYRIDOSTIGMINE (MESTINON) 60 MG TABLET    Take 60 mg by mouth 3 (three) times daily. Breakfast lunch and dinner   SACCHAROMYCES BOULARDII (FLORASTOR) 250 MG CAPSULE    Take 250 mg by mouth 2 (two) times daily.   TRAMADOL (ULTRAM) 50 MG TABLET    Take one tablet by mouth every 12 hours as needed for moderate pain  Modified Medications   No medications on file  Discontinued Medications   No medications on file     Physical Exam:  Filed Vitals:   10/26/13 1533  BP: 101/58  Pulse: 74  Temp: 97.1 F (36.2 C)  Resp: 20    Weight: 172 lb (78.019 kg)    Physical Exam  Constitutional: No distress.  Thin male in NAD  HENT:  Mouth/Throat: Oropharynx is clear and moist. No oropharyngeal exudate.  Eyes: Conjunctivae and EOM are normal. Pupils are equal, round, and reactive to light.  Neck: Normal range of motion. Neck supple.  Cardiovascular: Normal rate, regular rhythm and normal heart sounds.   Pulmonary/Chest: Effort normal. No respiratory distress. He has decreased breath sounds. He has rhonchi (thoughtout).  Diminished -  requring O2  Abdominal: Soft. Bowel sounds are normal. He exhibits no distension.  Musculoskeletal: He exhibits no edema and no tenderness.  Up in geri chair  Lymphadenopathy:    He has no cervical adenopathy.  Neurological: He is alert.  Skin: Skin is warm and dry. He is not diaphoretic.  Psychiatric: Affect normal.     Labs reviewed: Basic Metabolic Panel:  Recent Labs  07/28/13 1130 07/29/13 0441 07/30/13 0640  NA 147* 146* 143  K 4.3 4.3 4.4  CL 110 109 107  CO2 $Re'29 30 31  'rCE$ GLUCOSE 179* 151* 117*  BUN 67* 62* 49*  CREATININE 1.18 1.16 1.20  CALCIUM 9.0 8.8 9.0   Liver Function Tests:  Recent Labs  07/03/13 0615  AST 22  ALT 14  ALKPHOS 84  BILITOT 0.9  PROT 6.4  ALBUMIN 2.7*   No results found for this basename: LIPASE, AMYLASE,  in the last 8760 hours No results found for this basename: AMMONIA,  in the last 8760 hours CBC:  Recent Labs  07/02/13 1611  07/24/13 1549  07/28/13 1130 07/29/13 0441 07/30/13 0640  WBC 7.8  < > 13.5*  < > 14.1* 13.0* 11.9*  NEUTROABS 5.7  --  10.9*  --   --  10.8*  --   HGB 11.4*  < > 13.1  < > 12.4* 12.0* 11.9*  HCT 33.4*  < > 38.6*  < > 37.9* 37.8* 37.7*  MCV 92.0  < > 91.0  < > 93.3 95.2 95.7  PLT 206  < > 322  < > 265 258 218  < > = values in this interval not displayed. Cardiac Enzymes:  Recent Labs  07/25/13 1600 07/27/13 1301 07/28/13 0040  TROPONINI <0.30 <0.30 <0.30   CBC with Diff    Result:  09/16/2013 3:17 PM   ( Status: F )       WBC 10.3     4.0-10.5 K/uL SLN   RBC 3.08   L 4.22-5.81 MIL/uL SLN   Hemoglobin 8.6   L 13.0-17.0 g/dL SLN   Hematocrit 26.7   L 39.0-52.0 % SLN   MCV 86.7     78.0-100.0 fL SLN   MCH 27.9     26.0-34.0 pg SLN   MCHC 32.2     30.0-36.0 g/dL SLN   RDW 16.6   H 11.5-15.5 % SLN   Platelet Count 249     150-400 K/uL SLN   Granulocyte % 55     43-77 % SLN   Absolute Gran 5.7     1.7-7.7 K/uL SLN   Lymph % 32     12-46 % SLN   Absolute Lymph 3.3     0.7-4.0 K/uL SLN   Mono % 10     3-12 % SLN   Absolute Mono 1.0     0.1-1.0 K/uL SLN   Eos % 3     0-5 % SLN   Absolute Eos 0.3     0.0-0.7 K/uL SLN   Baso % 0     0-1 % SLN   Absolute Baso 0.0     0.0-0.1 K/uL SLN   Smear Review Criteria for review not met  SLN   Comprehensive Metabolic Panel    Result: 09/16/2013 3:00 PM   ( Status: F )       Sodium 137     135-145 mEq/L SLN   Potassium 3.8     3.5-5.3 mEq/L SLN   Chloride 104  96-112 mEq/L SLN   CO2 27     19-32 mEq/L SLN   Glucose 127   H 70-99 mg/dL SLN   BUN 15     9-81 mg/dL SLN   Creatinine 0.25     0.50-1.35 mg/dL SLN   Bilirubin, Total 0.4     0.2-1.2 mg/dL SLN C Alkaline Phosphatase 102     39-117 U/L SLN   AST/SGOT 16     0-37 U/L SLN   ALT/SGPT 8     0-53 U/L SLN   Total Protein 5.3   L 6.0-8.3 g/dL SLN   Albumin 1.9   L 4.8-6.2 g/dL SLN   Calcium 8.0   L 8.2-41.7 mg/dL SLN   CMP with Estimated GFR    Result: 10/16/2013 3:48 PM   ( Status: F )     C Sodium 136     135-145 mEq/L SLN   Potassium 4.3     3.5-5.3 mEq/L SLN   Chloride 102     96-112 mEq/L SLN   CO2 30     19-32 mEq/L SLN   Glucose 106   H 70-99 mg/dL SLN   BUN 24   H 5-30 mg/dL SLN   Creatinine 1.04     0.50-1.35 mg/dL SLN   Bilirubin, Total 0.4     0.2-1.2 mg/dL SLN   Alkaline Phosphatase 135   H 39-117 U/L SLN   AST/SGOT 32     0-37 U/L SLN   ALT/SGPT 33     0-53 U/L SLN   Total Protein 6.0     6.0-8.3 g/dL SLN   Albumin 2.6   L 0.4-5.9 g/dL SLN    Calcium 8.6     8.4-10.5 mg/dL SLN   Est GFR, African American >89      mL/min SLN   Est GFR, NonAfrican American 78      mL/min SLN C CBC NO Diff (Complete Blood Count)    Result: 10/16/2013 3:23 PM   ( Status: F )       WBC 7.8     4.0-10.5 K/uL SLN   RBC 3.07   L 4.22-5.81 MIL/uL SLN   Hemoglobin 8.9   L 13.0-17.0 g/dL SLN   Hematocrit 13.6   L 39.0-52.0 % SLN   MCV 91.9     78.0-100.0 fL SLN   MCH 29.0     26.0-34.0 pg SLN   MCHC 31.6     30.0-36.0 g/dL SLN   RDW 85.9   H 92.3-41.4 % SLN   Platelet Count 268     150-400 K/uL SLN   Assessment/Plan 1. Atrial fibrillation -rate controlled without medications at this time, dig dc'd by cards, pt doing well, not on anticoagulation due to falls and bleeding risk  2. Chronic diastolic heart failure -remains on O2, lasix and potassium supplements, no exacerbations or worsening of symptoms  3. Decubitus ulcer of sacral region, stage 3 -followed by wound care and treatment nurse; wound vac in place with changes every 3 days   4. Spinal stenosis of lumbar region -still complaining of pain during visit; however staff reports there is not much complaints to them; currently on lidocaine patch and tylenol scheduled with avoiding medication stronger due to pts sensitives and sedating effects.   5. Foley catheter in place -due to unstagable wound with wound vac  6. Failure to thrive in adult -ongoing FTT with debility and weakness,  however pt has had steady weight gain and getting OOB with  assistance, will cont supplements, Remeron and supportive care -pt currently under palliative care and receiving respite services since dc'd from therapy   7. Conjunctivitis Remains with left eye redness despite treatment; referral has been ordered to ophthamology   8. Orthostasis -overall with less complaints; taking midodrine 10 mg TID  9. Myasthenia gravis Continues on mestinon 60 mg TID  10. BPH (benign prostatic hyperplasia) -with frequent  urination on proscar; currently with catheter due to unstable wound   11. Hypothyroid -borderline low TSH; conts on synthroid  will follow up TSH

## 2013-10-29 ENCOUNTER — Non-Acute Institutional Stay (SKILLED_NURSING_FACILITY): Payer: Medicare Other | Admitting: Internal Medicine

## 2013-10-29 ENCOUNTER — Encounter: Payer: Self-pay | Admitting: Internal Medicine

## 2013-10-29 DIAGNOSIS — Z9889 Other specified postprocedural states: Secondary | ICD-10-CM

## 2013-10-29 DIAGNOSIS — N4889 Other specified disorders of penis: Secondary | ICD-10-CM

## 2013-10-29 DIAGNOSIS — Z950 Presence of cardiac pacemaker: Secondary | ICD-10-CM

## 2013-10-29 DIAGNOSIS — Z978 Presence of other specified devices: Secondary | ICD-10-CM

## 2013-10-29 DIAGNOSIS — Z960 Presence of urogenital implants: Secondary | ICD-10-CM

## 2013-10-29 NOTE — Progress Notes (Signed)
MRN: [id number] Name: Marco Gregory  Sex: male Age: 80 y.o. DOB: [birth year]  [facility] Facility/Room: 322A Level Of Care: SNF Provider: Merrilee SeashoreALEXANDER, Schyler Butikofer D Emergency Contacts: Extended Emergency Contact Information Primary Emergency Contact: Vanes-Edmonds,Joyce Address: [address]           [place], [place] [address] [place] Home Phone: [phone] Mobile Phone: [phone] Relation: Daughter Secondary Emergency Contact: Phineas DouglasBrantley,June [place] Home Phone: [phone] Relation: Daughter  Code Status:DNR  Allergies: Review of patient's allergies indicates no known allergies.  Chief Complaint  Patient presents with  . Acute Visit    HPI: Patient is 80 y.o. male who c/o swelling to penis and the nurses have asked me to see him.  Past Medical History  Diagnosis Date  . Long-term (current) use of anticoagulants   . Glaucoma   . Gout   . Hyperthyroidism   . Orthostatic hypotension   . Redundant prepuce and phimosis   . Acute cystitis   . Hypertonicity of bladder   . HTN (hypertension)   . Atelectasis   . Dysautonomia   . Pacemaker- [facility]     DOI 2012  . CHF (congestive heart failure)     Previous preserved EF.  [name]. Atrial fibrillation   . Sinus node dysfunction   . Pneumonia 08/2011    after hernia OR  . Hypothyroidism   . Sleep apnea     does not use cpap ; last used ~ 2011 (03/10/12)  . Spinal stenosis of lumbar region   . Chronic kidney disease [facility]     UTI  1 MO AGO TX MEDICALLY   . Osteomyelitis of toe of right foot ~ 2009    amputated 2nd toe  . Hypothyroid     Past Surgical History  Procedure Laterality Date  . Elbow bursa surgery  04/07/2006    Right elbow septic olecranon bursitis  . Toe amputation  ~ 2009    Osteomyelitis, right foot second toe /  Right foot second ray amputation  . Cardiac catheterization  07/03/2002    EF 60-70% /  Normal left ventricular function. / Very mild  trivial three-vessel coronary atherosclerosis. / There is no mitral regurgitation noted  . Tonsillectomy and adenoidectomy      "when I was a youngster"  . Appendectomy      "when I was a youngster"  . Inguinal hernia repair  ~ 2003/13    right  . Cataract extraction w/ intraocular lens  implant, bilateral  01/2005      Medication List       This list is accurate as of: 10/29/13  1:20 PM.  Always use your most recent med list.               collagenase ointment  Commonly known as:  SANTYL  Apply topically daily.     dorzolamide-timolol 22.3-6.8 MG/ML ophthalmic solution  Commonly known as:  COSOPT  Place 1 drop into both eyes daily.     feeding supplement (ENSURE) Pudg  Take 1 Container by mouth 2 (two) times daily between meals.     feeding supplement (PRO-STAT SUGAR FREE 64) Liqd  Take 30 mLs by mouth 3 (three) times daily with meals.     finasteride 5 MG tablet  Commonly known as:  PROSCAR  Take 5 mg by mouth daily.     furosemide 20 MG tablet  Commonly known as:  LASIX  Take 1 tablet (20 mg  total) by mouth every other day.     levothyroxine 150 MCG tablet  Commonly known as:  SYNTHROID, LEVOTHROID  Take 150 mcg by mouth daily before breakfast.     lidocaine 5 %  Commonly known as:  LIDODERM  Place 1 patch onto the skin daily. Remove & Discard patch within 12 hours or as directed by MD     meclizine 25 MG tablet  Commonly known as:  ANTIVERT  Take 25 mg by mouth 3 (three) times daily as needed for dizziness.     midodrine 10 MG tablet  Commonly known as:  PROAMATINE  Take 10 mg by mouth 3 (three) times daily.     MILK OF MAGNESIA PO  Take by mouth as needed.     mirtazapine 15 MG tablet  Commonly known as:  REMERON  Take 15 mg by mouth at bedtime.     multivitamin with minerals Tabs tablet  Take 1 tablet by mouth daily.     pyridostigmine 60 MG tablet  Commonly known as:  MESTINON  Take 60 mg by mouth 3 (three) times daily. Breakfast lunch and  dinner     saccharomyces boulardii 250 MG capsule  Commonly known as:  FLORASTOR  Take 250 mg by mouth 2 (two) times daily.     traMADol 50 MG tablet  Commonly known as:  ULTRAM  Take one tablet by mouth every 12 hours as needed for moderate pain     TYLENOL EX ST ARTHRITIS PAIN 500 MG tablet  Generic drug:  acetaminophen  Take 500 mg by mouth 3 (three) times daily.        No orders of the defined types were placed in this encounter.    Immunization History  Administered Date(s) Administered  . Influenza,inj,Quad PF,36+ Mos 07/04/2013  . Influenza-Unspecified 07/04/2013    History  Substance Use Topics  . Smoking status: Former Smoker -- 1.00 packs/day for 25 years    Types: Cigarettes    Quit date: 08/09/1965  . Smokeless tobacco: Never Used  . Alcohol Use: No     Comment: "last alcohol ~ 2008"    Review of Systems  DATA OBTAINED: from patient GENERAL: Feels well no fevers, fatigue, appetite changes SKIN: No itching, rash HEENT: No complaint RESPIRATORY: No cough, wheezing, SOB CARDIAC: No chest pain, palpitations, lower extremity edema  GI: No abdominal pain, No N/V/D or constipation, No heartburn or reflux  GU: pt's foley just replaced;pt thinks maybe truama? Says it doesn't hurt or itch MUSCULOSKELETAL: No unrelieved bone/joint pain NEUROLOGIC: No headache, dizziness or focal weakness PSYCHIATRIC: No overt anxiety or sadness. Sleeps well.   Filed Vitals:   10/29/13 1318  BP: 109/67  Pulse: 68  Temp: 97.3 F (36.3 C)  Resp: 18    Physical Exam  GENERAL APPEARANCE: Alert, conversant. Appropriately groomed. No acute distress  SKIN: No diaphoresis rash HEENT: Unremarkable RESPIRATORY: Breathing is even, unlabored. Lung sounds are clear   CARDIOVASCULAR: Heart RRR no murmurs, rubs or gallops. No peripheral edema  GASTROINTESTINAL: Abdomen is soft, non-tender, not distended w/ normal bowel sounds.  GENITOURINARY: mild swelling ant glans at raphe; firm,  no redness or heat MUSCULOSKELETAL: No abnormal joints or musculature NEUROLOGIC: Cranial nerves 2-12 grossly intact. Moves all extremities no tremor. PSYCHIATRIC: Mood and affect appropriate to situation, no behavioral issues  Patient Active Problem List   Diagnosis Date Noted  . Spinal stenosis of lumbar region   . Ulcer of toe of right foot 08/13/2013  .  Unspecified hypothyroidism 08/04/2013  . Foley catheter in place 08/04/2013  . HCAP (healthcare-associated pneumonia) 08/03/2013  . Chest pain   . Acute kidney injury 07/25/2013  . Acute renal failure 07/24/2013  . Decubitus ulcer of sacral region, stage 3 07/03/2013  . Acute exacerbation of CHF (congestive heart failure) 07/03/2013  . Fall at home 07/03/2013  . Skin ulcer 06/26/2013  . UTI (urinary tract infection) 06/26/2013  . PAF (paroxysmal atrial fibrillation) 08/07/2012  . Long-term (current) use of anticoagulants   . Chronic diastolic heart failure 04/19/2012  . Orthostasis 04/19/2012  . Myasthenia gravis 04/19/2012  . Pancreatic mass 04/19/2012  . COPD exacerbation 03/10/2012  . Overactive bladder 04/05/2011  . Atrial fibrillation 12/03/2008  . DYSAUTONOMIA 12/03/2008  . 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 OBSTRUCTIVE SLEEP APNEA 11/24/2007    CBC    Component Value Date/Time   WBC 11.9* 07/30/2013 0640   WBC 7.5 06/07/2012 1607   RBC 3.94* 07/30/2013 0640   RBC 4.52* 06/07/2012 1607   HGB 11.9* 07/30/2013 0640   HGB 13.7* 06/07/2012 1607   HCT 37.7* 07/30/2013 0640   HCT 45.2 06/07/2012 1607   PLT 218 07/30/2013 0640   MCV 95.7 07/30/2013 0640   MCV 99.9* 06/07/2012 1607   LYMPHSABS 1.3 07/29/2013 0441   MONOABS 0.8 07/29/2013 0441   EOSABS 0.1 07/29/2013 0441   BASOSABS 0.0 07/29/2013 0441    CMP     Component Value Date/Time   NA 143 07/30/2013 0640   K 4.4 07/30/2013 0640   CL 107 07/30/2013 0640   CO2 31 07/30/2013 0640   GLUCOSE 117* 07/30/2013 0640   BUN 49* 07/30/2013 0640   CREATININE 1.20  07/30/2013 0640   CREATININE 1.06 06/07/2012 1603   CALCIUM 9.0 07/30/2013 0640   PROT 6.4 07/03/2013 0615   ALBUMIN 2.7* 07/03/2013 0615   AST 22 07/03/2013 0615   ALT 14 07/03/2013 0615   ALKPHOS 84 07/03/2013 0615   BILITOT 0.9 07/03/2013 0615   GFRNONAA 51* 07/30/2013 0640   GFRAA 60* 07/30/2013 0640    Assessment and Plan  PENILE SWELLING- nothing characteristic; lotrisone cream BID for 2 weeks  Margit Hanks, MD

## 2013-11-05 ENCOUNTER — Non-Acute Institutional Stay (SKILLED_NURSING_FACILITY): Payer: Medicare Other | Admitting: Internal Medicine

## 2013-11-05 ENCOUNTER — Encounter: Payer: Self-pay | Admitting: Internal Medicine

## 2013-11-05 DIAGNOSIS — R600 Localized edema: Secondary | ICD-10-CM

## 2013-11-05 DIAGNOSIS — H409 Unspecified glaucoma: Secondary | ICD-10-CM | POA: Insufficient documentation

## 2013-11-05 DIAGNOSIS — R635 Abnormal weight gain: Secondary | ICD-10-CM

## 2013-11-05 DIAGNOSIS — R609 Edema, unspecified: Secondary | ICD-10-CM

## 2013-11-05 NOTE — Progress Notes (Signed)
MRN: [id number] Name: Marco Gregory  Sex: male Age: 80 y.o. DOB: 29-Jul-1921  [name] Facility/Room: 322 Level Of Care: SNF Provider: Merrilee Seashore D Emergency Contacts: Extended Emergency Contact Information Primary Emergency Contact: Tinnell-Edmonds,Joyce Address: [address]           [place], [place] [address] [place] Home Phone: [phone] Mobile Phone: [phone] Relation: Daughter Secondary Emergency Contact: Phineas Douglas [place] Home Phone: [phone] Relation: Daughter  Code Status: DNR  Allergies: Review of patient's allergies indicates no known allergies.  Chief Complaint  Patient presents with  . Medical Managment of Chronic Issues    HPI: Patient is 80 y.o. male who nursing has asked me to see for weight gain. Pt has a few concerns also.  Past Medical History  Diagnosis Date  . Long-term (current) use of anticoagulants   . Glaucoma   . Gout   . Hyperthyroidism   . Orthostatic hypotension   . Redundant prepuce and phimosis   . Acute cystitis   . Hypertonicity of bladder   . HTN (hypertension)   . Atelectasis   . Dysautonomia   . Pacemaker- [facility]     DOI 2012  . CHF (congestive heart failure)     Previous preserved EF.  [name] Atrial fibrillation   . Sinus node dysfunction   . Pneumonia 08/2011    after hernia OR  . Hypothyroidism   . Sleep apnea     does not use cpap ; last used ~ 2011 (03/10/12)  . Spinal stenosis of lumbar region   . Chronic kidney disease [facility]     UTI  1 MO AGO TX MEDICALLY   . Osteomyelitis of toe of right foot ~ 2009    amputated 2nd toe  . Hypothyroid   . Glaucoma     Past Surgical History  Procedure Laterality Date  . Elbow bursa surgery  04/07/2006    Right elbow septic olecranon bursitis  . Toe amputation  ~ 2009    Osteomyelitis, right foot second toe /  Right foot second ray amputation  . Cardiac catheterization  07/03/2002    EF 60-70% /   Normal left ventricular function. / Very mild trivial three-vessel coronary atherosclerosis. / There is no mitral regurgitation noted  . Tonsillectomy and adenoidectomy      "when I was a youngster"  . Appendectomy      "when I was a youngster"  . Inguinal hernia repair  ~ 2003/13    right  . Cataract extraction w/ intraocular lens  implant, bilateral  01/2005      Medication List       This list is accurate as of: 11/05/13  9:04 PM.  Always use your most recent med list.               collagenase ointment  Commonly known as:  SANTYL  Apply topically daily.     dorzolamide-timolol 22.3-6.8 MG/ML ophthalmic solution  Commonly known as:  COSOPT  Place 1 drop into both eyes daily.     feeding supplement (ENSURE) Pudg  Take 1 Container by mouth 2 (two) times daily between meals.     feeding supplement (PRO-STAT SUGAR FREE 64) Liqd  Take 30 mLs by mouth 3 (three) times daily with meals.     finasteride 5 MG tablet  Commonly known as:  PROSCAR  Take 5 mg by mouth daily.     furosemide 20 MG tablet  Commonly  known as:  LASIX  Take 1 tablet (20 mg total) by mouth every other day.     levothyroxine 150 MCG tablet  Commonly known as:  SYNTHROID, LEVOTHROID  Take 150 mcg by mouth daily before breakfast.     lidocaine 5 %  Commonly known as:  LIDODERM  Place 1 patch onto the skin daily. Remove & Discard patch within 12 hours or as directed by MD     meclizine 25 MG tablet  Commonly known as:  ANTIVERT  Take 25 mg by mouth 3 (three) times daily as needed for dizziness.     midodrine 10 MG tablet  Commonly known as:  PROAMATINE  Take 10 mg by mouth 3 (three) times daily.     MILK OF MAGNESIA PO  Take by mouth as needed.     mirtazapine 15 MG tablet  Commonly known as:  REMERON  Take 15 mg by mouth at bedtime.     multivitamin with minerals Tabs tablet  Take 1 tablet by mouth daily.     pyridostigmine 60 MG tablet  Commonly known as:  MESTINON  Take 60 mg by mouth  3 (three) times daily. Breakfast lunch and dinner     saccharomyces boulardii 250 MG capsule  Commonly known as:  FLORASTOR  Take 250 mg by mouth 2 (two) times daily.     traMADol 50 MG tablet  Commonly known as:  ULTRAM  Take one tablet by mouth every 12 hours as needed for moderate pain     TYLENOL EX ST ARTHRITIS PAIN 500 MG tablet  Generic drug:  acetaminophen  Take 500 mg by mouth 3 (three) times daily.        No orders of the defined types were placed in this encounter.    Immunization History  Administered Date(s) Administered  . Influenza,inj,Quad PF,36+ Mos 07/04/2013  . Influenza-Unspecified 07/04/2013    History  Substance Use Topics  . Smoking status: Former Smoker -- 1.00 packs/day for 25 years    Types: Cigarettes    Quit date: 08/09/1965  . Smokeless tobacco: Never Used  . Alcohol Use: No     Comment: "last alcohol ~ 2008"    Review of Systems  DATA OBTAINED: from patient,  family member GENERAL: Feels well no fevers, fatigue, appetite changes SKIN: No itching, rash HEENT: No complaint RESPIRATORY: Occ cough, no wheezing, no SOB CARDIAC: No chest pain, palpitations,no  lower extremity edema ; L foot is diffusely swollen without heat or redness but ankle and above totally without edema GI: No abdominal pain, No N/V/D or constipation, No heartburn or reflux  GU: No dysuria, frequency or urgency, or incontinence  MUSCULOSKELETAL: No unrelieved bone/joint pain NEUROLOGIC: No headache, dizziness or focal weakness; pt says he can sit up on the side of the bed by himself now; he would really like to go back into PT PSYCHIATRIC: No overt anxiety or sadness. Sleeps well.   There were no vitals filed for this visit.  Physical Exam  GENERAL APPEARANCE: Alert, conversant. Appropriately groomed. No acute distress  SKIN: No diaphoresis rash, or wounds HEENT: Unremarkable RESPIRATORY: Breathing is even, unlabored. Lung sounds are clear   CARDIOVASCULAR: Heart  RRR no murmurs, rubs or gallops. No peripheral edema  GASTROINTESTINAL: Abdomen is soft, non-tender, not distended w/ normal bowel sounds.  GENITOURINARY: Bladder non tender, not distended  MUSCULOSKELETAL: No abnormal joints or musculature NEUROLOGIC: Cranial nerves 2-12 grossly intact. Moves all extremities no tremor. PSYCHIATRIC: Mood and affect appropriate to  situation, no behavioral issues  Patient Active Problem List   Diagnosis Date Noted  . Glaucoma   . Spinal stenosis of lumbar region   . Ulcer of toe of right foot 08/13/2013  . Unspecified hypothyroidism 08/04/2013  . Foley catheter in place 08/04/2013  . HCAP (healthcare-associated pneumonia) 08/03/2013  . Chest pain   . Acute kidney injury 07/25/2013  . Acute renal failure 07/24/2013  . Decubitus ulcer of sacral region, stage 3 07/03/2013  . Acute exacerbation of CHF (congestive heart failure) 07/03/2013  . Fall at home 07/03/2013  . Skin ulcer 06/26/2013  . UTI (urinary tract infection) 06/26/2013  . PAF (paroxysmal atrial fibrillation) 08/07/2012  . Long-term (current) use of anticoagulants   . Chronic diastolic heart failure 04/19/2012  . Orthostasis 04/19/2012  . Myasthenia gravis 04/19/2012  . Pancreatic mass 04/19/2012  . COPD exacerbation 03/10/2012  . Overactive bladder 04/05/2011  . Atrial fibrillation 12/03/2008  . DYSAUTONOMIA 12/03/2008  . 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 OBSTRUCTIVE SLEEP APNEA 11/24/2007    CBC    Component Value Date/Time   WBC 11.9* 07/30/2013 0640   WBC 7.5 06/07/2012 1607   RBC 3.94* 07/30/2013 0640   RBC 4.52* 06/07/2012 1607   HGB 11.9* 07/30/2013 0640   HGB 13.7* 06/07/2012 1607   HCT 37.7* 07/30/2013 0640   HCT 45.2 06/07/2012 1607   PLT 218 07/30/2013 0640   MCV 95.7 07/30/2013 0640   MCV 99.9* 06/07/2012 1607   LYMPHSABS 1.3 07/29/2013 0441   MONOABS 0.8 07/29/2013 0441   EOSABS 0.1 07/29/2013 0441   BASOSABS 0.0 07/29/2013 0441    CMP     Component Value  Date/Time   NA 143 07/30/2013 0640   K 4.4 07/30/2013 0640   CL 107 07/30/2013 0640   CO2 31 07/30/2013 0640   GLUCOSE 117* 07/30/2013 0640   BUN 49* 07/30/2013 0640   CREATININE 1.20 07/30/2013 0640   CREATININE 1.06 06/07/2012 1603   CALCIUM 9.0 07/30/2013 0640   PROT 6.4 07/03/2013 0615   ALBUMIN 2.7* 07/03/2013 0615   AST 22 07/03/2013 0615   ALT 14 07/03/2013 0615   ALKPHOS 84 07/03/2013 0615   BILITOT 0.9 07/03/2013 0615   GFRNONAA 51* 07/30/2013 0640   GFRAA 60* 07/30/2013 0640    Assessment and Plan  WEIGHT GAIN - pt has gained weight again this week. The question is-is it fluid or could it possibly be real weight gain from eating more?  I visited pt during dinner and he was sitting up in bed eating with gusto. It turns out that his daughter's cousin visits him EVERY night while he eats, spending social time with him and encouraging him to eat. She said he seems to enjoy his food more. She believes, herself, he is gaining weight from eating. Lack of signs or sx of fluid retention.  Glaucoma Pt asked ifhe could get his eye drops daily instead of prn.I assured him he is supposed to have them daily. I have written an order for him to get them in the am in hopes the day shift will give them daily.   L FOOT EDEMA - appears to be a local event;positional?; will [employer] spent with pt and family  >  Lyn Hollingshead, Randon Goldsmith, MD

## 2013-11-05 NOTE — Assessment & Plan Note (Signed)
Pt asked ifhe could get his eye drops daily instead of prn.I assured him he is supposed to have them daily. I have written an order for him to get them in the am in hopes the day shift will give them daily.

## 2013-11-09 ENCOUNTER — Telehealth: Payer: Self-pay | Admitting: Internal Medicine

## 2013-11-09 NOTE — Telephone Encounter (Signed)
New Message  Pt daughter called states that the pt is in a nursing home// requests a call back to discuss his BP dropping as he sits up and that causes dizziness. She states that the pt has to continually lay down to keep from being dizzy. Requests a call back to discuss.

## 2013-11-09 NOTE — Telephone Encounter (Signed)
We have not seen him since November. [facility].  The daughter should have [facility], if she wants him seen there.  They have been seeing him in the nursing home. He is so complex that he would probably need an inpatient workup rather than just an outpatient visit. If daughter wants us to see him in the office that will be fine.  In the past his orthostatic hypotension has responded to midodrine and florinef combinations.

## 2013-11-09 NOTE — Telephone Encounter (Signed)
Daughter wants patient to be [facility] for [facility] hypot[facility] and dizziness. Will forward to  Dr. Patty SermonsBrackbill for recommendations.

## 2013-11-09 NOTE — Telephone Encounter (Signed)
Spoke with daughter and patient actually diagnosed with pneumonia. She is going to wait until this resolves and call back for an appointment

## 2013-11-19 ENCOUNTER — Inpatient Hospital Stay (HOSPITAL_COMMUNITY)
Admission: EM | Admit: 2013-11-19 | Discharge: 2013-11-23 | DRG: 291 | Disposition: A | Discharge status: Hospice | Payer: Medicare Other | Attending: Internal Medicine | Admitting: Internal Medicine

## 2013-11-19 ENCOUNTER — Encounter: Payer: Self-pay | Admitting: Internal Medicine

## 2013-11-19 ENCOUNTER — Emergency Department (HOSPITAL_COMMUNITY): Payer: Medicare Other

## 2013-11-19 ENCOUNTER — Encounter (HOSPITAL_COMMUNITY): Payer: Self-pay | Admitting: Emergency Medicine

## 2013-11-19 ENCOUNTER — Non-Acute Institutional Stay (SKILLED_NURSING_FACILITY): Payer: Medicare Other | Admitting: Internal Medicine

## 2013-11-19 DIAGNOSIS — L8993 Pressure ulcer of unspecified site, stage 3: Secondary | ICD-10-CM

## 2013-11-19 DIAGNOSIS — I480 Paroxysmal atrial fibrillation: Secondary | ICD-10-CM

## 2013-11-19 DIAGNOSIS — J441 Chronic obstructive pulmonary disease with (acute) exacerbation: Secondary | ICD-10-CM

## 2013-11-19 DIAGNOSIS — Z7901 Long term (current) use of anticoagulants: Secondary | ICD-10-CM

## 2013-11-19 DIAGNOSIS — J9600 Acute respiratory failure, unspecified whether with hypoxia or hypercapnia: Secondary | ICD-10-CM | POA: Diagnosis present

## 2013-11-19 DIAGNOSIS — J189 Pneumonia, unspecified organism: Secondary | ICD-10-CM

## 2013-11-19 DIAGNOSIS — L89153 Pressure ulcer of sacral region, stage 3: Secondary | ICD-10-CM

## 2013-11-19 DIAGNOSIS — Q078 Other specified congenital malformations of nervous system: Secondary | ICD-10-CM

## 2013-11-19 DIAGNOSIS — I4891 Unspecified atrial fibrillation: Secondary | ICD-10-CM

## 2013-11-19 DIAGNOSIS — Z960 Presence of urogenital implants: Secondary | ICD-10-CM

## 2013-11-19 DIAGNOSIS — M48061 Spinal stenosis, lumbar region without neurogenic claudication: Secondary | ICD-10-CM

## 2013-11-19 DIAGNOSIS — R079 Chest pain, unspecified: Secondary | ICD-10-CM

## 2013-11-19 DIAGNOSIS — I509 Heart failure, unspecified: Secondary | ICD-10-CM

## 2013-11-19 DIAGNOSIS — W19XXXA Unspecified fall, initial encounter: Secondary | ICD-10-CM

## 2013-11-19 DIAGNOSIS — I10 Essential (primary) hypertension: Secondary | ICD-10-CM | POA: Diagnosis present

## 2013-11-19 DIAGNOSIS — Z9849 Cataract extraction status, unspecified eye: Secondary | ICD-10-CM

## 2013-11-19 DIAGNOSIS — J40 Bronchitis, not specified as acute or chronic: Secondary | ICD-10-CM | POA: Diagnosis present

## 2013-11-19 DIAGNOSIS — I951 Orthostatic hypotension: Secondary | ICD-10-CM

## 2013-11-19 DIAGNOSIS — I5032 Chronic diastolic (congestive) heart failure: Secondary | ICD-10-CM

## 2013-11-19 DIAGNOSIS — Z9889 Other specified postprocedural states: Secondary | ICD-10-CM

## 2013-11-19 DIAGNOSIS — N318 Other neuromuscular dysfunction of bladder: Secondary | ICD-10-CM | POA: Diagnosis present

## 2013-11-19 DIAGNOSIS — E43 Unspecified severe protein-calorie malnutrition: Secondary | ICD-10-CM | POA: Diagnosis present

## 2013-11-19 DIAGNOSIS — Z9181 History of falling: Secondary | ICD-10-CM

## 2013-11-19 DIAGNOSIS — N179 Acute kidney failure, unspecified: Secondary | ICD-10-CM

## 2013-11-19 DIAGNOSIS — G7000 Myasthenia gravis without (acute) exacerbation: Secondary | ICD-10-CM

## 2013-11-19 DIAGNOSIS — Z978 Presence of other specified devices: Secondary | ICD-10-CM

## 2013-11-19 DIAGNOSIS — IMO0002 Reserved for concepts with insufficient information to code with codable children: Secondary | ICD-10-CM

## 2013-11-19 DIAGNOSIS — K8689 Other specified diseases of pancreas: Secondary | ICD-10-CM

## 2013-11-19 DIAGNOSIS — Z515 Encounter for palliative care: Secondary | ICD-10-CM

## 2013-11-19 DIAGNOSIS — I5033 Acute on chronic diastolic (congestive) heart failure: Principal | ICD-10-CM

## 2013-11-19 DIAGNOSIS — L89109 Pressure ulcer of unspecified part of back, unspecified stage: Secondary | ICD-10-CM

## 2013-11-19 DIAGNOSIS — M109 Gout, unspecified: Secondary | ICD-10-CM | POA: Diagnosis present

## 2013-11-19 DIAGNOSIS — E039 Hypothyroidism, unspecified: Secondary | ICD-10-CM

## 2013-11-19 DIAGNOSIS — M869 Osteomyelitis, unspecified: Secondary | ICD-10-CM | POA: Diagnosis present

## 2013-11-19 DIAGNOSIS — J9690 Respiratory failure, unspecified, unspecified whether with hypoxia or hypercapnia: Secondary | ICD-10-CM

## 2013-11-19 DIAGNOSIS — N3281 Overactive bladder: Secondary | ICD-10-CM

## 2013-11-19 DIAGNOSIS — I495 Sick sinus syndrome: Secondary | ICD-10-CM

## 2013-11-19 DIAGNOSIS — G4730 Sleep apnea, unspecified: Secondary | ICD-10-CM | POA: Diagnosis present

## 2013-11-19 DIAGNOSIS — Y92009 Unspecified place in unspecified non-institutional (private) residence as the place of occurrence of the external cause: Secondary | ICD-10-CM

## 2013-11-19 DIAGNOSIS — R296 Repeated falls: Secondary | ICD-10-CM

## 2013-11-19 DIAGNOSIS — S98139A Complete traumatic amputation of one unspecified lesser toe, initial encounter: Secondary | ICD-10-CM

## 2013-11-19 DIAGNOSIS — L98499 Non-pressure chronic ulcer of skin of other sites with unspecified severity: Secondary | ICD-10-CM

## 2013-11-19 DIAGNOSIS — Z87891 Personal history of nicotine dependence: Secondary | ICD-10-CM

## 2013-11-19 DIAGNOSIS — H409 Unspecified glaucoma: Secondary | ICD-10-CM

## 2013-11-19 DIAGNOSIS — G4733 Obstructive sleep apnea (adult) (pediatric): Secondary | ICD-10-CM

## 2013-11-19 DIAGNOSIS — N390 Urinary tract infection, site not specified: Secondary | ICD-10-CM

## 2013-11-19 DIAGNOSIS — Z7401 Bed confinement status: Secondary | ICD-10-CM

## 2013-11-19 DIAGNOSIS — Z79899 Other long term (current) drug therapy: Secondary | ICD-10-CM

## 2013-11-19 DIAGNOSIS — Z66 Do not resuscitate: Secondary | ICD-10-CM | POA: Diagnosis present

## 2013-11-19 DIAGNOSIS — L97519 Non-pressure chronic ulcer of other part of right foot with unspecified severity: Secondary | ICD-10-CM

## 2013-11-19 DIAGNOSIS — Z950 Presence of cardiac pacemaker: Secondary | ICD-10-CM

## 2013-11-19 LAB — CBC WITH DIFFERENTIAL/PLATELET
BASOS ABS: 0 10*3/uL (ref 0.0–0.1)
BASOS PCT: 0 % (ref 0–1)
EOS PCT: 3 % (ref 0–5)
Eosinophils Absolute: 0.2 10*3/uL (ref 0.0–0.7)
HCT: 34.2 % — ABNORMAL LOW (ref 39.0–52.0)
Hemoglobin: 10.3 g/dL — ABNORMAL LOW (ref 13.0–17.0)
Lymphocytes Relative: 20 % (ref 12–46)
Lymphs Abs: 1.6 10*3/uL (ref 0.7–4.0)
MCH: 30.8 pg (ref 26.0–34.0)
MCHC: 30.1 g/dL (ref 30.0–36.0)
MCV: 102.4 fL — AB (ref 78.0–100.0)
MONO ABS: 1 10*3/uL (ref 0.1–1.0)
Monocytes Relative: 13 % — ABNORMAL HIGH (ref 3–12)
NEUTROS ABS: 4.8 10*3/uL (ref 1.7–7.7)
Neutrophils Relative %: 64 % (ref 43–77)
Platelets: 181 10*3/uL (ref 150–400)
RBC: 3.34 MIL/uL — ABNORMAL LOW (ref 4.22–5.81)
RDW: 15.6 % — AB (ref 11.5–15.5)
WBC: 7.6 10*3/uL (ref 4.0–10.5)

## 2013-11-19 LAB — BASIC METABOLIC PANEL
BUN: 23 mg/dL (ref 6–23)
CALCIUM: 9.3 mg/dL (ref 8.4–10.5)
CO2: 33 meq/L — AB (ref 19–32)
CREATININE: 0.72 mg/dL (ref 0.50–1.35)
Chloride: 101 mEq/L (ref 96–112)
GFR, EST NON AFRICAN AMERICAN: 80 mL/min — AB (ref >90)
Glucose, Bld: 109 mg/dL — ABNORMAL HIGH (ref 70–99)
Potassium: 5.1 mEq/L (ref 3.7–5.3)
SODIUM: 142 meq/L (ref 137–147)

## 2013-11-19 LAB — TSH: TSH: 0.245 u[IU]/mL — ABNORMAL LOW (ref 0.350–4.500)

## 2013-11-19 LAB — TROPONIN I: Troponin I: <0.3 ng/mL (ref <0.30)

## 2013-11-19 LAB — PRO B NATRIURETIC PEPTIDE: Pro B Natriuretic peptide (BNP): 2774 pg/mL — ABNORMAL HIGH (ref 0–450)

## 2013-11-19 MED ORDER — NITROGLYCERIN 0.4 MG SL SUBL: 0.4000 mg | SUBLINGUAL_TABLET | SUBLINGUAL | Status: DC | PRN

## 2013-11-19 MED ORDER — PANTOPRAZOLE SODIUM 40 MG PO TBEC
40.0000 mg | DELAYED_RELEASE_TABLET | Freq: Every day | ORAL | Status: DC
  Administered 2013-11-19 – 2013-11-23 (×5): 40 mg via ORAL
  Filled 2013-11-19 (×5): qty 1

## 2013-11-19 MED ORDER — SODIUM CHLORIDE 0.9 % IJ SOLN
3.0000 mL | Freq: Two times a day (BID) | INTRAMUSCULAR | Status: DC
  Administered 2013-11-19 – 2013-11-22 (×6): 3 mL via INTRAVENOUS

## 2013-11-19 MED ORDER — PRO-STAT SUGAR FREE PO LIQD
30.0000 mL | Freq: Three times a day (TID) | ORAL | Status: DC
  Administered 2013-11-20 – 2013-11-22 (×8): 30 mL via ORAL
  Filled 2013-11-19 (×10): qty 30

## 2013-11-19 MED ORDER — ENSURE PUDDING PO PUDG
1.0000 | Freq: Two times a day (BID) | ORAL | Status: DC
  Administered 2013-11-20 – 2013-11-22 (×5): 1 via ORAL

## 2013-11-19 MED ORDER — MIRTAZAPINE 15 MG PO TABS
15.0000 mg | ORAL_TABLET | Freq: Every day | ORAL | Status: DC
  Filled 2013-11-19: qty 1

## 2013-11-19 MED ORDER — FUROSEMIDE 10 MG/ML IJ SOLN: 40.0000 mg | Freq: Once | INTRAMUSCULAR | Status: DC

## 2013-11-19 MED ORDER — SODIUM CHLORIDE 0.9 % IJ SOLN: 3.0000 mL | INTRAMUSCULAR | Status: DC | PRN

## 2013-11-19 MED ORDER — LEVOTHYROXINE SODIUM 175 MCG PO TABS
175.0000 ug | ORAL_TABLET | Freq: Every day | ORAL | Status: DC
  Administered 2013-11-20 – 2013-11-23 (×4): 175 ug via ORAL
  Filled 2013-11-19 (×5): qty 1

## 2013-11-19 MED ORDER — ALBUTEROL SULFATE (2.5 MG/3ML) 0.083% IN NEBU
2.5000 mg | INHALATION_SOLUTION | Freq: Four times a day (QID) | RESPIRATORY_TRACT | Status: DC | PRN
  Administered 2013-11-20 – 2013-11-23 (×7): 2.5 mg via RESPIRATORY_TRACT
  Filled 2013-11-19 (×7): qty 3

## 2013-11-19 MED ORDER — BISACODYL 10 MG RE SUPP
10.0000 mg | Freq: Every day | RECTAL | Status: DC | PRN
  Filled 2013-11-19: qty 1

## 2013-11-19 MED ORDER — PYRIDOSTIGMINE BROMIDE 60 MG PO TABS
60.0000 mg | ORAL_TABLET | Freq: Three times a day (TID) | ORAL | Status: DC
  Administered 2013-11-20 – 2013-11-23 (×12): 60 mg via ORAL
  Filled 2013-11-19 (×13): qty 1

## 2013-11-19 MED ORDER — MIRTAZAPINE 15 MG PO TABS
15.0000 mg | ORAL_TABLET | Freq: Every day | ORAL | Status: DC
  Administered 2013-11-20 – 2013-11-22 (×3): 15 mg via ORAL
  Filled 2013-11-19 (×4): qty 1

## 2013-11-19 MED ORDER — ASPIRIN 81 MG PO CHEW
81.0000 mg | CHEWABLE_TABLET | Freq: Every day | ORAL | Status: DC
  Administered 2013-11-19 – 2013-11-23 (×5): 81 mg via ORAL
  Filled 2013-11-19 (×5): qty 1

## 2013-11-19 MED ORDER — SODIUM CHLORIDE 0.9 % IV SOLN: 250.0000 mL | INTRAVENOUS | Status: DC | PRN

## 2013-11-19 MED ORDER — ALBUTEROL SULFATE 0.63 MG/3ML IN NEBU: 1.0000 | INHALATION_SOLUTION | Freq: Four times a day (QID) | RESPIRATORY_TRACT | Status: DC | PRN

## 2013-11-19 MED ORDER — FUROSEMIDE 10 MG/ML IJ SOLN
40.0000 mg | Freq: Two times a day (BID) | INTRAMUSCULAR | Status: DC
  Administered 2013-11-20 (×2): 40 mg via INTRAVENOUS
  Filled 2013-11-19 (×4): qty 4

## 2013-11-19 MED ORDER — FINASTERIDE 5 MG PO TABS
5.0000 mg | ORAL_TABLET | Freq: Every day | ORAL | Status: DC
  Administered 2013-11-20 – 2013-11-23 (×4): 5 mg via ORAL
  Filled 2013-11-19 (×4): qty 1

## 2013-11-19 MED ORDER — DORZOLAMIDE HCL-TIMOLOL MAL 2-0.5 % OP SOLN
1.0000 [drp] | Freq: Every day | OPHTHALMIC | Status: DC
  Administered 2013-11-20 – 2013-11-23 (×4): 1 [drp] via OPHTHALMIC
  Filled 2013-11-19: qty 10

## 2013-11-19 MED ORDER — SACCHAROMYCES BOULARDII 250 MG PO CAPS
250.0000 mg | ORAL_CAPSULE | Freq: Two times a day (BID) | ORAL | Status: DC
  Administered 2013-11-19 – 2013-11-23 (×8): 250 mg via ORAL
  Filled 2013-11-19 (×9): qty 1

## 2013-11-19 MED ORDER — ENOXAPARIN SODIUM 40 MG/0.4ML ~~LOC~~ SOLN
40.0000 mg | SUBCUTANEOUS | Status: DC
  Administered 2013-11-19 – 2013-11-22 (×4): 40 mg via SUBCUTANEOUS
  Filled 2013-11-19 (×5): qty 0.4

## 2013-11-19 NOTE — H&P (Signed)
[facility] History and Physical  Marco PilgrimCharles G Gregory NWG:[id number] DOB: May 14, 1921 DOA: 11/19/2013  Referring physician:  PCP: Marco SidleLAUENSTEIN,KURT, MD   Chief Complaint: Shortness of breath  HPI: Marco Gregory is a 80 y.o. male with multiple comorbidities including poor functional status, bedbound, history of tachybradycardia syndrome status post pacemaker implant, atrial fibrillation presently not on anticoagulation, chronic diastolic congestive heart failure, myasthenia gravis, history of frequent falls, stage 3/4 decubitus ulcer, indwelling Foley catheter who is currently a nursing home resident, transferred to the emergency department with complaints of increasing shortness of breath. Patient was diagnosed with upper respiratory tract infection at a skilled nursing facility for which he was treated with Avelox therapy. Despite this intervention he continued to have worsening shortness of breath with symptoms acutely worsening over the past 2-3 days. He has also experienced increasing cough, bilateral extremity pitting edema, orthopnea, brown sputum production and increase in oxygen demand. He denies chest pain, fevers, chills, nausea, vomiting, abdominal pain, recent falls, focal neurological deficits. In the emergency room he was found to have a BNP of 2774 with a chest x-ray showing findings suggestive of congestive heart failure. He was administered 40 mg of IV Lasix.                                                                                                                                                                                                                           Review of Systems:  Constitutional:  No weight loss, night sweats, Fevers, chills, fatigue.  HEENT:  No headaches, Difficulty swallowing,Tooth/dental problems,Sore throat,  No sneezing, itching, ear ache, nasal congestion, post nasal drip,  Cardio-vascular:  No chest pain, Orthopnea, PND, swelling in  lower extremities, anasarca, dizziness, palpitations  GI:  No heartburn, indigestion, abdominal pain, nausea, vomiting, diarrhea, change in bowel habits, loss of appetite  Resp:  Positive for shortness of breath with exertion or at rest. Positive for excess mucus, no productive cough, No non-productive cough, No coughing up of blood.No change in color of mucus.No wheezing.No chest wall deformity  Skin:  no rash or lesions.  GU:  no dysuria, change in color of urine, no urgency or frequency. No flank pain.  Musculoskeletal:  Positive for increased bilateral lower extremity edema. No decreased range of motion. No back pain.  Psych:  No change in mood or affect. No depression or anxiety. No memory loss.   Past Medical History  Diagnosis Date  . Long-term (current) use of anticoagulants   .  Glaucoma   . Gout   . Hyperthyroidism   . Orthostatic hypotension   . Redundant prepuce and phimosis   . Acute cystitis   . Hypertonicity of bladder   . HTN (hypertension)   . Atelectasis   . Dysautonomia   . Pacemaker- [facility] [employer]     DOI 2012  . CHF (congestive heart failure)     Previous preserved EF.  [name] Atrial fibrillation   . Sinus node dysfunction   . Pneumonia 08/2011    after hernia OR  . Hypothyroidism   . Sleep apnea     does not use cpap ; last used ~ 2011 (03/10/12)  . Spinal stenosis of lumbar region   . Chronic kidney disease [facility]     UTI  1 MO AGO TX MEDICALLY   . Osteomyelitis of toe of right foot ~ 2009    amputated 2nd toe  . Hypothyroid   . Glaucoma    Past Surgical History  Procedure Laterality Date  . Elbow bursa surgery  04/07/2006    Right elbow septic olecranon bursitis  . Toe amputation  ~ 2009    Osteomyelitis, right foot second toe /  Right foot second ray amputation  . Cardiac catheterization  07/03/2002    EF 60-70% /  Normal left ventricular function. / Very mild trivial three-vessel coronary atherosclerosis. / There is no mitral regurgitation  noted  . Tonsillectomy and adenoidectomy      "when I was a youngster"  . Appendectomy      "when I was a youngster"  . Inguinal hernia repair  ~ 2003/13    right  . Cataract extraction w/ intraocular lens  implant, bilateral  01/2005   Social History:  reports that he quit smoking about 48 years ago. His smoking use included Cigarettes. He has a 25 pack-year smoking history. He has never used smokeless tobacco. He reports that he does not drink alcohol or use illicit drugs.  No Known Allergies  Family History  Problem Relation Age of Onset  . Cancer Mother 35    GYN     Prior to Admission medications   Medication Sig Start Date End Date Taking? Authorizing Provider  acetaminophen (TYLENOL EX ST ARTHRITIS PAIN) 500 MG tablet Take 500 mg by mouth 3 (three) times daily.   Yes Historical Provider, MD  albuterol (ACCUNEB) 0.63 MG/3ML nebulizer solution Take 1 ampule by nebulization every 6 (six) hours as needed for wheezing.   Yes Historical Provider, MD  bisacodyl (DULCOLAX) 10 MG suppository Place 10 mg rectally daily as needed for moderate constipation.   Yes Historical Provider, MD  dorzolamide-timolol (COSOPT) 22.3-6.8 MG/ML ophthalmic solution Place 1 drop into both eyes daily.    Yes Historical Provider, MD  feeding supplement, ENSURE, (ENSURE) PUDG Take 1 Container by mouth 2 (two) times daily between meals. 07/11/13  Yes Beverely Low, MD  finasteride (PROSCAR) 5 MG tablet Take 5 mg by mouth daily.    Yes Historical Provider, MD  furosemide (LASIX) 20 MG tablet Take 1 tablet (20 mg total) by mouth every other day. 07/11/13  Yes Beverely Low, MD  levothyroxine (SYNTHROID, LEVOTHROID) 175 MCG tablet Take 175 mcg by mouth daily before breakfast.   Yes Historical Provider, MD  lidocaine (LIDODERM) 5 % Place 1 patch onto the skin daily. Remove & Discard patch within 12 hours or as directed by MD   Yes Historical Provider, MD  meclizine (ANTIVERT) 25 MG tablet Take 25 mg by mouth  3 (three)  times daily as needed for dizziness.   Yes Historical Provider, MD  midodrine (PROAMATINE) 10 MG tablet Take 10 mg by mouth 3 (three) times daily.  03/19/13  Yes Cassell Clementhomas Brackbill, MD  mirtazapine (REMERON) 15 MG tablet Take 15 mg by mouth at bedtime.   Yes Historical Provider, MD  moxifloxacin (AVELOX) 400 MG tablet Take 400 mg by mouth daily at 8 pm.   Yes Historical Provider, MD  Multiple Vitamin (MULTIVITAMIN WITH MINERALS) TABS Take 1 tablet by mouth daily.   Yes Historical Provider, MD  pyridostigmine (MESTINON) 60 MG tablet Take 60 mg by mouth 3 (three) times daily. Breakfast lunch and dinner 04/06/11  Yes Duke SalviaSteven C Klein, MD  saccharomyces boulardii (FLORASTOR) 250 MG capsule Take 250 mg by mouth 2 (two) times daily.   Yes Historical Provider, MD  Amino Acids-Protein Hydrolys (FEEDING SUPPLEMENT, PRO-STAT SUGAR FREE 64,) LIQD Take 30 mLs by mouth 3 (three) times daily with meals. 07/11/13   Beverely LowElena Adamo, MD   Physical Exam: Filed Vitals:   11/19/13 1716  BP: 134/85  Pulse: 83  Temp:   Resp: 23    BP 134/85  Pulse 83  Temp(Src) 98.2 F (36.8 C) (Oral)  Resp 23  Ht 6\' 3"  (1.905 m)  Wt 82.555 kg (182 lb)  BMI 22.75 kg/m2  SpO2 100%  General:  Chronically ill-appearing. He appears to be in mild respiratory distress, although awake alert oriented Eyes: PERRL, normal lids, irises & conjunctiva ENT: grossly normal hearing, lips & tongue Neck: Symmetrical, positive JVD Cardiovascular: RRR, 2-6 systolic ejection murmur. Has 1+ bilateral pedal edema Telemetry: SR, no arrhythmias  Respiratory: Bibasilar crackles are present, positive rales, no wheezing or rhonchi. On supplemental oxygen. Abdomen: soft, ntnd Skin: There is a stage III, possibly stage IV decubitus ulcer, wound is packed Musculoskeletal: Multiple anatomical abnormalities bilaterally 2 joints Psychiatric: grossly normal mood and affect, speech fluent and appropriate Neurologic: grossly non-focal.          Labs on  Admission:  Basic Metabolic Panel:  Recent Labs Lab 11/19/13 1535  NA 142  K 5.1  CL 101  CO2 33*  GLUCOSE 109*  BUN 23  CREATININE 0.72  CALCIUM 9.3   Liver Function Tests: No results found for this basename: AST, ALT, ALKPHOS, BILITOT, PROT, ALBUMIN,  in the last 168 hours No results found for this basename: LIPASE, AMYLASE,  in the last 168 hours No results found for this basename: AMMONIA,  in the last 168 hours CBC:  Recent Labs Lab 11/19/13 1535  WBC 7.6  NEUTROABS 4.8  HGB 10.3*  HCT 34.2*  MCV 102.4*  PLT 181   Cardiac Enzymes:  Recent Labs Lab 11/19/13 1535  TROPONINI <0.30    BNP (last 3 results)  Recent Labs  07/02/13 1612 07/24/13 1549 11/19/13 1535  PROBNP 2389.0* 8586.0* 2774.0*   CBG: No results found for this basename: GLUCAP,  in the last 168 hours  Radiological Exams on Admission: Dg Chest 2 View  11/19/2013   CLINICAL DATA:  Cough and shortness of Breath  EXAM: CHEST  2 VIEW  COMPARISON:  07/27/2013  FINDINGS: There is a left chest wall pacer device with lead in the right atrial appendage and right ventricle. Moderate cardiac enlargement is identified. There are moderate volume bilateral pleural effusions and interstitial edema consistent with CHF. Atelectasis is noted in the lung bases.  IMPRESSION: 1. Cardiac enlargement and congestive heart failure.   Electronically Signed   By: Ladona Ridgelaylor  Bradly Chris M.D.   On: 11/19/2013 16:54    EKG: Independently reviewed. EKG showed a paced rhythm  Assessment/Plan Principal Problem:   Acute on chronic diastolic CHF (congestive heart failure), NYHA class 1 Active Problems:   Respiratory failure   Atrial fibrillation   PPM-St.Jude   Myasthenia gravis   Decubitus ulcer of sacral region, stage 3   Foley catheter in place   1. Acute on chronic diastolic congestive heart failure. Patient presenting with clinical signs and symptoms suggestive of congestive heart failure, with chest x-ray showing  interstitial edema consistent with CHF. Lab work revealing an elevated BNP of 2774. He denies chest pain. He had been on Lasix 20 mg every other day at his skilled nursing. Will admit patient to telemetry, start IV Lasix at 40 mg IV twice a day, monitor ins and outs and follow daily weights. It appears his last transthoracic echocardiogram was performed on 07/03/2013 which revealed an ejection fraction of 55%, indeterminate diastolic dysfunction given the presence of atrial fibrillation.  2. Acute hypoxemic respiratory failure, evidence by increased oxygen requirement of 4 L, oxygen saturation of 88% in the emergency room, likely secondary to acute decompensated CHF. Provide supplemental oxygen, IV Lasix, place patient on telemetry, repeat chest x-ray in a.m. 3. History of upper respiratory tract infection. I think her symptoms likely secondary to acute CHF. He recently completed antibiotic course of Avelox at a skilled nursing facility. He is presently afebrile, having a normal white count. Will hold off on broad-spectrum IV antimicrobial therapy for now.  4. Stage III/IV decubitus ulcer. Will consult wound care 5. Atrial fibrillation. Patient presently not on anticoagulation, ventricular rates in the 70s to 80s in the emergency room. We'll continue to monitor. 6. History of myasthenia gravis. Stable, continue Mestinon 7. Hypothyroidism. Patient on Synthroid 175 mcg by mouth daily, will check a TSH 8. Chronic Foley catheter placement. He is afebrile, with normal white count. Urinalysis would likely revealed the presence of bacteria.  9. DVT prophylaxis. Lovenox subcutaneous    Code Status: DO NOT RESUSCITATE Family Communication: Spoke to patient's daughter at bedside, she confirmed patient is a DO NOT RESUSCITATE Disposition Plan: Anticipate patient will require greater than 2 night hospitalization  Time spent: 70 minutes  Jeralyn Bennett [facility] Pager [phone]

## 2013-11-19 NOTE — ED Notes (Signed)
Report called to WaunaLatisha, Charity fundraiserN.

## 2013-11-19 NOTE — Progress Notes (Signed)
MRN: [id number] Name: Marco Gregory  Sex: male Age: [age] y.o. DOB: 05-11-1921  [facility] Facility[facility]: 322 Level Of Care: SNF Provider: Margit HanksAnne D Kallum Jorgensen Emergency Contacts: Extended Emergency Contact Information Primary Emergency Contact: Taha-Edmonds,Joyce Address: [address]           [place], [place] [address] [place] Home Phone: [phone] Mobile Phone: [phone] Relation: Daughter Secondary Emergency Contact: Phineas DouglasBrantley,June [place] Home Phone: [phone] Relation: Daughter  Code Status: DNR  Allergies: Review of patient's allergies indicates no known allergies.  Chief Complaint  Patient presents with  . Acute Visit    HPI: Patient is 80 y.o. male whose daughter and who nursing asked me to see for worsening SOB.  Past Medical History  Diagnosis Date  . Long-term (current) use of anticoagulants   . Glaucoma   . Gout   . Hyperthyroidism   . Orthostatic hypotension   . Redundant prepuce and phimosis   . Acute cystitis   . Hypertonicity of bladder   . HTN (hypertension)   . Atelectasis   . Dysautonomia   . Pacemaker- st [employer]     DOI 2012  . CHF (congestive heart failure)     Previous preserved EF.  [name]. Atrial fibrillation   . Sinus node dysfunction   . Pneumonia 08/2011    after hernia OR  . Hypothyroidism   . Sleep apnea     does not use cpap ; last used ~ 2011 (03/10/12)  . Spinal stenosis of lumbar region   . Chronic kidney disease [facility]     UTI  1 MO AGO TX MEDICALLY   . Osteomyelitis of toe of right foot ~ 2009    amputated 2nd toe  . Hypothyroid   . Glaucoma     Past Surgical History  Procedure Laterality Date  . Elbow bursa surgery  04/07/2006    Right elbow septic olecranon bursitis  . Toe amputation  ~ 2009    Osteomyelitis, right foot second toe /  Right foot second ray amputation  . Cardiac catheterization  07/03/2002    EF 60-70% /  Normal left ventricular function. /  Very mild trivial three-vessel coronary atherosclerosis. / There is no mitral regurgitation noted  . Tonsillectomy and adenoidectomy      "when I was a youngster"  . Appendectomy      "when I was a youngster"  . Inguinal hernia repair  ~ 2003/13    right  . Cataract extraction w/ intraocular lens  implant, bilateral  01/2005      Medication List       This list is accurate as of: 11/19/13  3:02 PM.  Always use your most recent med list.               collagenase ointment  Commonly known as:  SANTYL  Apply topically daily.     dorzolamide-timolol 22.3-6.8 MG/ML ophthalmic solution  Commonly known as:  COSOPT  Place 1 drop into both eyes daily.     feeding supplement (ENSURE) Pudg  Take 1 Container by mouth 2 (two) times daily between meals.     feeding supplement (PRO-STAT SUGAR FREE 64) Liqd  Take 30 mLs by mouth 3 (three) times daily with meals.     finasteride 5 MG tablet  Commonly known as:  PROSCAR  Take 5 mg by mouth daily.     furosemide 20 MG tablet  Commonly known as:  LASIX  Take 1  tablet (20 mg total) by mouth every other day.     levothyroxine 150 MCG tablet  Commonly known as:  SYNTHROID, LEVOTHROID  Take 150 mcg by mouth daily before breakfast.     lidocaine 5 %  Commonly known as:  LIDODERM  Place 1 patch onto the skin daily. Remove & Discard patch within 12 hours or as directed by MD     meclizine 25 MG tablet  Commonly known as:  ANTIVERT  Take 25 mg by mouth 3 (three) times daily as needed for dizziness.     midodrine 10 MG tablet  Commonly known as:  PROAMATINE  Take 10 mg by mouth 3 (three) times daily.     MILK OF MAGNESIA PO  Take by mouth as needed.     mirtazapine 15 MG tablet  Commonly known as:  REMERON  Take 15 mg by mouth at bedtime.     multivitamin with minerals Tabs tablet  Take 1 tablet by mouth daily.     pyridostigmine 60 MG tablet  Commonly known as:  MESTINON  Take 60 mg by mouth 3 (three) times daily. Breakfast  lunch and dinner     saccharomyces boulardii 250 MG capsule  Commonly known as:  FLORASTOR  Take 250 mg by mouth 2 (two) times daily.     traMADol 50 MG tablet  Commonly known as:  ULTRAM  Take one tablet by mouth every 12 hours as needed for moderate pain     TYLENOL EX ST ARTHRITIS PAIN 500 MG tablet  Generic drug:  acetaminophen  Take 500 mg by mouth 3 (three) times daily.        No orders of the defined types were placed in this encounter.    Immunization History  Administered Date(s) Administered  . Influenza,inj,Quad PF,36+ Mos 07/04/2013  . Influenza-Unspecified 07/04/2013    History  Substance Use Topics  . Smoking status: Former Smoker -- 1.00 packs/day for 25 years    Types: Cigarettes    Quit date: 08/09/1965  . Smokeless tobacco: Never Used  . Alcohol Use: No     Comment: "last alcohol ~ 2008"    Review of Systems  DATA OBTAINED: from patient, nurse, family member GENERAL:Not Feel well, no fevers, +fatigue,  SKIN: No itching, rash; new ulcer L hip HEENT: No complaint RESPIRATORY: No cough,+ wheezing + SOB CARDIAC: No chest pain, palpitations, lower leg with some swelling along with foot that has been swollen for2 weeks; no pain GI: No abdominal pain, No N/V/D or constipation, No heartburn or reflux  GU: No dysuria, frequency or urgency, or incontinence; foley is pulling  MUSCULOSKELETAL: No unrelieved bone/joint pain NEUROLOGIC: No headache, dizziness or focal weakness PSYCHIATRIC: + anxiety or no sadness.    Filed Vitals:   11/19/13 1458  BP: 127/58  Pulse: 70  Temp: 97.6 F (36.4 C)  Resp: 20    Physical Exam  GENERAL APPEARANCE: Alert, conversant. Appropriately groomed SKIN: No diaphoresis rash; sacral wound, not evaled; L leg shallow ulcer with dressing, L ant hip new shallow ulcer without infection HEENT: Unremarkable RESPIRATORY: Breathing is mild-mod labored with intercostal retractions ; diffuse dec BS, diffuse wheezing ; good wet  cough     O2 sat per ADAMD at bedside was 91% 2L CARDIOVASCULAR: Heart RRR no murmurs, rubs or gallops. Trace peripheral edema B feet and L ankle GASTROINTESTINAL: Abdomen is soft, non-tender, not distended w/ normal bowel sounds.     GENITOURINARY: Bladder non tender, not distended; foley in  place  MUSCULOSKELETAL: No abnormal joints or musculature NEUROLOGIC: Cranial nerves 2-12 grossly intact. Moves all extremities no tremor. PSYCHIATRIC: Mood and affect appropriate to situation, no behavioral issues  Patient Active Problem List   Diagnosis Date Noted  . Glaucoma   . Spinal stenosis of lumbar region   . Ulcer of toe of right foot 08/13/2013  . Unspecified hypothyroidism 08/04/2013  . Foley catheter in place 08/04/2013  . HCAP (healthcare-associated pneumonia) 08/03/2013  . Chest pain   . Acute kidney injury 07/25/2013  . Acute renal failure 07/24/2013  . Decubitus ulcer of sacral region, stage 3 07/03/2013  . Acute exacerbation of CHF (congestive heart failure) 07/03/2013  . Fall at home 07/03/2013  . Skin ulcer 06/26/2013  . UTI (urinary tract infection) 06/26/2013  . PAF (paroxysmal atrial fibrillation) 08/07/2012  . Long-term (current) use of anticoagulants   . Chronic diastolic heart failure 04/19/2012  . Orthostasis 04/19/2012  . Myasthenia gravis 04/19/2012  . Pancreatic mass 04/19/2012  . COPD exacerbation 03/10/2012  . Overactive bladder 04/05/2011  . Atrial fibrillation 12/03/2008  . DYSAUTONOMIA 12/03/2008  . 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 OBSTRUCTIVE SLEEP APNEA 11/24/2007    CBC    Component Value Date/Time   WBC 11.9* 07/30/2013 0640   WBC 7.5 06/07/2012 1607   RBC 3.94* 07/30/2013 0640   RBC 4.52* 06/07/2012 1607   HGB 11.9* 07/30/2013 0640   HGB 13.7* 06/07/2012 1607   HCT 37.7* 07/30/2013 0640   HCT 45.2 06/07/2012 1607   PLT 218 07/30/2013 0640   MCV 95.7 07/30/2013 0640   MCV 99.9* 06/07/2012 1607   LYMPHSABS 1.3 07/29/2013 0441   MONOABS 0.8  07/29/2013 0441   EOSABS 0.1 07/29/2013 0441   BASOSABS 0.0 07/29/2013 0441    CMP     Component Value Date/Time   NA 143 07/30/2013 0640   K 4.4 07/30/2013 0640   CL 107 07/30/2013 0640   CO2 31 07/30/2013 0640   GLUCOSE 117* 07/30/2013 0640   BUN 49* 07/30/2013 0640   CREATININE 1.20 07/30/2013 0640   CREATININE 1.06 06/07/2012 1603   CALCIUM 9.0 07/30/2013 0640   PROT 6.4 07/03/2013 0615   ALBUMIN 2.7* 07/03/2013 0615   AST 22 07/03/2013 0615   ALT 14 07/03/2013 0615   ALKPHOS 84 07/03/2013 0615   BILITOT 0.9 07/03/2013 0615   GFRNONAA 51* 07/30/2013 0640   GFRAA 60* 07/30/2013 0640    Assessment and Plan  SOB/WHEEZING/ RECENT PNA  - pt just finished 7 days Avelox for PNA per CXR at SNF; pt c/o not better and now wheezing and is very concerned as is his daughter; he is delicate does go down quickly, and SNF  Is not quick; he doesn't sound wet but wheezing could be cardiac or COPD or both; if he's going to need steroids then lasix will need to be increased; then Cr will need to be watched; Hb may have dropped as well;pt also has dysautonia complicating picture as well; pt and daughter desire pt to go to hospital  SACRAL DECUBITUS AND OTHER SKIN LESIONS- continue current care  FOLEY CATHETER - would be nice to d/c but if pt going to be on increased lasix, not yet; was placed initially for urinary retention and it has been helpful for protection of sacral decubitus  Margit HanksAnne D Salar Molden, MD

## 2013-11-19 NOTE — ED Notes (Signed)
Family at bedside. 

## 2013-11-19 NOTE — ED Notes (Signed)
[facility], has had pmn X10 days with no improvement, sats 88% on RA, denies pain, c/o SOB, is A/O on arrival, VSS, NAD

## 2013-11-19 NOTE — ED Provider Notes (Addendum)
CSN: [id number]     Arrival date & time 11/19/13  1519 History   First MD Initiated Contact with Patient 11/19/13 1521     Chief Complaint  Patient presents with  . Shortness of Breath     (Consider location/radiation/quality/duration/timing/severity/associated sxs/prior Treatment) HPI Comments: Patient brought to the ER by ambulance from a nursing home. Patient has been having episodes of increasing shortness of breath associated with paroxysms of coughing. Patient was diagnosed with pneumonia, has been on Avelox for 10 days and has not improved. Probably the ER, patient is feeling better on increased oxygen. He is not drinking any chest pain. Reports that he is short of breath all the time, but when he has coughing fits he gets very anxious and has panic attacks.  Patient is a 80 y.o. male presenting with shortness of breath.  Shortness of Breath Associated symptoms: cough     Past Medical History  Diagnosis Date  . Long-term (current) use of anticoagulants   . Glaucoma   . Gout   . Hyperthyroidism   . Orthostatic hypotension   . Redundant prepuce and phimosis   . Acute cystitis   . Hypertonicity of bladder   . HTN (hypertension)   . Atelectasis   . Dysautonomia   . Pacemaker- st [employer]     DOI 2012  . CHF (congestive heart failure)     Previous preserved EF.  [name]. Atrial fibrillation   . Sinus node dysfunction   . Pneumonia 08/2011    after hernia OR  . Hypothyroidism   . Sleep apnea     does not use cpap ; last used ~ 2011 (03/10/12)  . Spinal stenosis of lumbar region   . Chronic kidney disease [facility]     UTI  1 MO AGO TX MEDICALLY   . Osteomyelitis of toe of right foot ~ 2009    amputated 2nd toe  . Hypothyroid   . Glaucoma    Past Surgical History  Procedure Laterality Date  . Elbow bursa surgery  04/07/2006    Right elbow septic olecranon bursitis  . Toe amputation  ~ 2009    Osteomyelitis, right foot second toe /  Right foot second ray amputation  .  Cardiac catheterization  07/03/2002    EF 60-70% /  Normal left ventricular function. / Very mild trivial three-vessel coronary atherosclerosis. / There is no mitral regurgitation noted  . Tonsillectomy and adenoidectomy      "when I was a youngster"  . Appendectomy      "when I was a youngster"  . Inguinal hernia repair  ~ 2003/13    right  . Cataract extraction w/ intraocular lens  implant, bilateral  01/2005   Family History  Problem Relation Age of Onset  . Cancer Mother 4152    GYN   History  Substance Use Topics  . Smoking status: Former Smoker -- 1.00 packs/day for 25 years    Types: Cigarettes    Quit date: 08/09/1965  . Smokeless tobacco: Never Used  . Alcohol Use: No     Comment: "last alcohol ~ 2008"    Review of Systems  Respiratory: Positive for cough and shortness of breath.   Psychiatric/Behavioral: The patient is nervous/anxious.   All other systems reviewed and are negative.     Allergies  Review of patient's allergies indicates no known allergies.  Home Medications   Current Outpatient Rx  Name  Route  Sig  Dispense  Refill  . acetaminophen (  TYLENOL EX ST ARTHRITIS PAIN) 500 MG tablet   Oral   Take 500 mg by mouth 3 (three) times daily.         [name]. albuterol (ACCUNEB) 0.63 MG/3ML nebulizer solution   Nebulization   Take 1 ampule by nebulization every 6 (six) hours as needed for wheezing.         . bisacodyl (DULCOLAX) 10 MG suppository   Rectal   Place 10 mg rectally daily as needed for moderate constipation.         . dorzolamide-timolol (COSOPT) 22.3-6.8 MG/ML ophthalmic solution   Both Eyes   Place 1 drop into both eyes daily.          . feeding supplement, ENSURE, (ENSURE) PUDG   Oral   Take 1 Container by mouth 2 (two) times daily between meals.   60 Container   0   . finasteride (PROSCAR) 5 MG tablet   Oral   Take 5 mg by mouth daily.          . furosemide (LASIX) 20 MG tablet   Oral   Take 1 tablet (20 mg total) by  mouth every other day.   15 tablet   0   . levothyroxine (SYNTHROID, LEVOTHROID) 175 MCG tablet   Oral   Take 175 mcg by mouth daily before breakfast.         . lidocaine (LIDODERM) 5 %   Transdermal   Place 1 patch onto the skin daily. Remove & Discard patch within 12 hours or as directed by MD         . meclizine (ANTIVERT) 25 MG tablet   Oral   Take 25 mg by mouth 3 (three) times daily as needed for dizziness.         . midodrine (PROAMATINE) 10 MG tablet   Oral   Take 10 mg by mouth 3 (three) times daily.          . mirtazapine (REMERON) 15 MG tablet   Oral   Take 15 mg by mouth at bedtime.         . moxifloxacin (AVELOX) 400 MG tablet   Oral   Take 400 mg by mouth daily at 8 pm.         . Multiple Vitamin (MULTIVITAMIN WITH MINERALS) TABS   Oral   Take 1 tablet by mouth daily.         [name]. pyridostigmine (MESTINON) 60 MG tablet   Oral   Take 60 mg by mouth 3 (three) times daily. Breakfast lunch and dinner         . saccharomyces boulardii (FLORASTOR) 250 MG capsule   Oral   Take 250 mg by mouth 2 (two) times daily.         . Amino Acids-Protein Hydrolys (FEEDING SUPPLEMENT, PRO-STAT SUGAR FREE 64,) LIQD   Oral   Take 30 mLs by mouth 3 (three) times daily with meals.   900 mL   0    BP 132/69  Pulse 71  Temp(Src) 98.2 F (36.8 C) (Oral)  Resp 22  Ht 6\' 3"  (1.905 m)  Wt 182 lb (82.555 kg)  BMI 22.75 kg/m2  SpO2 100% Physical Exam  Constitutional: He is oriented to person, place, and time. He appears well-developed and well-nourished. No distress.  HENT:  Head: Normocephalic and atraumatic.  Right Ear: Hearing normal.  Left Ear: Hearing normal.  Nose: Nose normal.  Mouth/Throat: Oropharynx is clear and moist and mucous membranes are normal.  Eyes: Conjunctivae and EOM are normal. Pupils are equal, round, and reactive to light.  Neck: Normal range of motion. Neck supple.  Cardiovascular: Regular rhythm, S1 normal and S2 normal.  Exam  reveals no gallop and no friction rub.   No murmur heard. Pulmonary/Chest: Effort normal. No respiratory distress. He has decreased breath sounds. He has rhonchi. He has rales. He exhibits no tenderness.  Abdominal: Soft. Normal appearance and bowel sounds are normal. There is no hepatosplenomegaly. There is no tenderness. There is no rebound, no guarding, no tenderness at McBurney's point and negative Murphy's sign. No hernia.  Musculoskeletal: Normal range of motion.  Neurological: He is alert and oriented to person, place, and time. He has normal strength. No cranial nerve deficit or sensory deficit. Coordination normal. GCS eye subscore is 4. GCS verbal subscore is 5. GCS motor subscore is 6.  Skin: Skin is warm and dry. No rash noted. No cyanosis.     Psychiatric: He has a normal mood and affect. His speech is normal and behavior is normal. Thought content normal.    ED Course  Procedures (including critical care time) Labs Review Labs Reviewed  CBC WITH DIFFERENTIAL - Abnormal; Notable for the following:    RBC 3.34 (*)    Hemoglobin 10.3 (*)    HCT 34.2 (*)    MCV 102.4 (*)    RDW 15.6 (*)    Monocytes Relative 13 (*)    All other components within normal limits  BASIC METABOLIC PANEL - Abnormal; Notable for the following:    CO2 33 (*)    Glucose, Bld 109 (*)    GFR calc non Af Amer 80 (*)    All other components within normal limits  PRO B NATRIURETIC PEPTIDE - Abnormal; Notable for the following:    Pro B Natriuretic peptide (BNP) 2774.0 (*)    All other components within normal limits  CULTURE, BLOOD (ROUTINE X 2)  CULTURE, BLOOD (ROUTINE X 2)  TROPONIN I   Imaging Review Dg Chest 2 View  11/19/2013   CLINICAL DATA:  Cough and shortness of Breath  EXAM: CHEST  2 VIEW  COMPARISON:  07/27/2013  FINDINGS: There is a left chest wall pacer device with lead in the right atrial appendage and right ventricle. Moderate cardiac enlargement is identified. There are moderate  volume bilateral pleural effusions and interstitial edema consistent with CHF. Atelectasis is noted in the lung bases.  IMPRESSION: 1. Cardiac enlargement and congestive heart failure.   Electronically Signed   By: Signa Kell M.D.   On: 11/19/2013 16:54     EKG Interpretation   Date/Time:  Monday November 19 2013 17:14:47 EDT Ventricular Rate:  127 PR Interval:  63 QRS Duration: 190 QT Interval:  459 QTC Calculation: 667 R Axis:   -70 Text Interpretation:  Sinus tachycardia Multiform ventricular premature  complexes RBBB and LAFB Artifact in lead(s) I II III aVR aVL aVF V1 V2      MDM   Final diagnoses:  CHF (congestive heart failure)    Patient brought to the ER for evaluation of chest pain. Patient has been treated with Avelox for 10 days after being diagnosed with pneumonia at the nursing home. He has been having increasing chest congestion, cough and shortness of breath. Patient reports intermittent episodes of severe worsening of his breathing and he thinks he might be having panic attacks. Upon arrival, patient is frail and short of breath. He does have some congestion and upper airway resonance,  but also is short of breath with some increased respiratory rate and work of breathing. He is on oxygen normally.  Chest x-ray is more consistent with congestive heart failure. He does have a history of congestive heart failure. He is on very small dose of Lasix currently. Renal function is normal. Patient administered Lasix 40 mg IV.  In addition to difficulty breathing, patient does have a chronic wound on his left shin and sacral area. The sacral decubitus has been present since November of last year has been managed previously with a wound VAC.   Gilda Crease, MD 11/19/13 1710  Gilda Crease, MD 11/19/13 [phone]

## 2013-11-20 ENCOUNTER — Inpatient Hospital Stay (HOSPITAL_COMMUNITY): Payer: Medicare Other

## 2013-11-20 DIAGNOSIS — N179 Acute kidney failure, unspecified: Secondary | ICD-10-CM

## 2013-11-20 DIAGNOSIS — N390 Urinary tract infection, site not specified: Secondary | ICD-10-CM

## 2013-11-20 DIAGNOSIS — E039 Hypothyroidism, unspecified: Secondary | ICD-10-CM

## 2013-11-20 DIAGNOSIS — I5033 Acute on chronic diastolic (congestive) heart failure: Principal | ICD-10-CM

## 2013-11-20 LAB — CBC
HCT: 33.4 % — ABNORMAL LOW (ref 39.0–52.0)
Hemoglobin: 9.8 g/dL — ABNORMAL LOW (ref 13.0–17.0)
MCH: 29.9 pg (ref 26.0–34.0)
MCHC: 29.3 g/dL — ABNORMAL LOW (ref 30.0–36.0)
MCV: 101.8 fL — ABNORMAL HIGH (ref 78.0–100.0)
Platelets: 162 10*3/uL (ref 150–400)
RBC: 3.28 MIL/uL — AB (ref 4.22–5.81)
RDW: 15.5 % (ref 11.5–15.5)
WBC: 6 10*3/uL (ref 4.0–10.5)

## 2013-11-20 LAB — MRSA PCR SCREENING: MRSA BY PCR: POSITIVE — AB

## 2013-11-20 LAB — BASIC METABOLIC PANEL
BUN: 20 mg/dL (ref 6–23)
CO2: 33 mEq/L — ABNORMAL HIGH (ref 19–32)
Calcium: 9.5 mg/dL (ref 8.4–10.5)
Chloride: 102 mEq/L (ref 96–112)
Creatinine, Ser: 0.61 mg/dL (ref 0.50–1.35)
GFR calc non Af Amer: 85 mL/min — ABNORMAL LOW (ref >90)
GLUCOSE: 99 mg/dL (ref 70–99)
POTASSIUM: 4.6 meq/L (ref 3.7–5.3)
Sodium: 142 mEq/L (ref 137–147)

## 2013-11-20 LAB — LIPID PANEL
CHOL/HDL RATIO: 2.3 ratio
CHOLESTEROL: 115 mg/dL (ref 0–200)
HDL: 49 mg/dL (ref >39)
LDL Cholesterol: 55 mg/dL (ref 0–99)
Triglycerides: 54 mg/dL (ref <150)
VLDL: 11 mg/dL (ref 0–40)

## 2013-11-20 LAB — URINALYSIS, ROUTINE W REFLEX MICROSCOPIC
Bilirubin Urine: NEGATIVE
GLUCOSE, UA: NEGATIVE mg/dL
KETONES UR: NEGATIVE mg/dL
Nitrite: POSITIVE — AB
PH: 6 (ref 5.0–8.0)
Protein, ur: 30 mg/dL — AB
Specific Gravity, Urine: 1.022 (ref 1.005–1.030)
Urobilinogen, UA: 0.2 mg/dL (ref 0.0–1.0)

## 2013-11-20 LAB — URINE MICROSCOPIC-ADD ON

## 2013-11-20 MED ORDER — BUDESONIDE 0.25 MG/2ML IN SUSP
0.2500 mg | Freq: Two times a day (BID) | RESPIRATORY_TRACT | Status: DC
  Administered 2013-11-20 – 2013-11-23 (×7): 0.25 mg via RESPIRATORY_TRACT
  Filled 2013-11-20 (×9): qty 2

## 2013-11-20 MED ORDER — CEFTRIAXONE SODIUM 1 G IJ SOLR
1.0000 g | Freq: Two times a day (BID) | INTRAMUSCULAR | Status: DC
  Administered 2013-11-20: 1 g via INTRAVENOUS
  Filled 2013-11-20 (×3): qty 10

## 2013-11-20 MED ORDER — DIPHENHYDRAMINE HCL 12.5 MG/5ML PO ELIX
12.5000 mg | ORAL_SOLUTION | ORAL | Status: AC | PRN
  Administered 2013-11-20 (×2): 12.5 mg via ORAL
  Filled 2013-11-20 (×3): qty 5

## 2013-11-20 MED ORDER — CHLORHEXIDINE GLUCONATE CLOTH 2 % EX PADS
6.0000 | MEDICATED_PAD | Freq: Every day | CUTANEOUS | Status: DC
  Administered 2013-11-21 – 2013-11-22 (×2): 6 via TOPICAL

## 2013-11-20 MED ORDER — MIDODRINE HCL 5 MG PO TABS
10.0000 mg | ORAL_TABLET | Freq: Three times a day (TID) | ORAL | Status: DC
  Administered 2013-11-20 – 2013-11-23 (×11): 10 mg via ORAL
  Filled 2013-11-20 (×12): qty 2

## 2013-11-20 MED ORDER — LEVOFLOXACIN 750 MG PO TABS
750.0000 mg | ORAL_TABLET | Freq: Every day | ORAL | Status: DC
  Administered 2013-11-20 – 2013-11-23 (×4): 750 mg via ORAL
  Filled 2013-11-20 (×4): qty 1

## 2013-11-20 MED ORDER — GUAIFENESIN ER 600 MG PO TB12
600.0000 mg | ORAL_TABLET | Freq: Two times a day (BID) | ORAL | Status: DC
  Administered 2013-11-20 – 2013-11-23 (×7): 600 mg via ORAL
  Filled 2013-11-20 (×8): qty 1

## 2013-11-20 MED ORDER — MIDODRINE HCL 5 MG PO TABS
10.0000 mg | ORAL_TABLET | Freq: Three times a day (TID) | ORAL | Status: DC
  Filled 2013-11-20 (×3): qty 2

## 2013-11-20 MED ORDER — MUPIROCIN 2 % EX OINT
TOPICAL_OINTMENT | Freq: Two times a day (BID) | CUTANEOUS | Status: DC
  Administered 2013-11-20: 1 via NASAL
  Administered 2013-11-21: 09:00:00 via NASAL
  Administered 2013-11-21: 1 via NASAL
  Administered 2013-11-22 – 2013-11-23 (×3): via NASAL
  Filled 2013-11-20: qty 22

## 2013-11-20 NOTE — Consult Note (Signed)
WOC wound consult note Reason for Consult: evaluation of sacral pressure ulcer. Pt known to this WOC nurse from previous admissions.  His daughter is at the bedside with him today. He has had a pressure ulcer on the sacral area since Nov. 2014.  He has had it treated in the last few weeks with NPWT at SNF.  However he has severe epibole of the wound edges which lessens the likelihood of the wound healing.  There is exposed bone centrally.  No granulation tissue, and thick purulent drainage over the wound. Wound type:Stage IV pressure ulcer sacrum Then he has 4 additional pressure ulcers over the lower buttocks (3) Stage II and (1) Stage III. The daughter requested I look a another area also on the LLE, this is an area of trauma that he sustained with a fall.   Pressure Ulcer POA: Yes x 5 Measurement: Scattered areas on the lower buttock each around 1.5 x 1.5x 0.2, the largest in this area is 3cm x 1.5cm x 0.5.  The sacrum has a significant opening that is 4cm x 3cm x 3.5cm with 4cm circumferential undermining   Wound bed:  Sacrum: non granular, bone exposed centrally, some darkening of the tissues LLE: pink, moist, clean, partial thickness The other areas over the buttock are clean, pink, moist, partial thickness Drainage (amount, consistency, odor) thick yellow drainage over the sacrum  Periwound: intact Dressing procedure/placement/frequency: Conservative tx with normal saline moist gauze packing to fill the wound bed, cover with foam to also include the other areas, change daily. Add air mattress for pressure redistribution. Silicone foam dressing for the LLE to protect and insulate, change every 3 days and PRN soilage.  Discussed the presence of the epibole and the chronicity of the wound with the patient/family and MD. Will ask plastic surgery to evaluate this patient to open the wound edges and any other potential tx to assist in healing this wound.  Discussed POC with patient and bedside  nurse.  Re consult if needed, will not follow at this time. Thanks  Maxden Naji [employer] RN, CWOCN [phone])

## 2013-11-20 NOTE — Clinical Social Work Psychosocial (Signed)
I have read and reviewed SW Intern's Brief Psychosocial Assessment and agree with it's content. CSW will need to follow up with family and complete new bed search for possible change in facility.  Lorri Frederickonna T. West PughCrowder, LCSWA  [phone]

## 2013-11-20 NOTE — Clinical Social Work Psychosocial (Signed)
Clinical Social Work Department BRIEF PSYCHOSOCIAL ASSESSMENT 11/20/2013  Patient:  Marco Gregory, Marco Gregory     Account Number:  [id number]     Admit date:  11/19/2013  Clinical Social Worker:  Donnella Sham, CLINICAL SOCIAL WORKER  Date/Time:  11/20/2013 02:52 PM  Referred by:  Physician  Date Referred:  11/20/2013 Referred for  SNF Placement   Other Referral:   Interview type:  Family Other interview type:    PSYCHOSOCIAL DATA Living Status:  FACILITY Admitted from facility:  [facility] Level of care:  [place] Primary support name:  Rush Farmer, June Brantley Primary support relationship to patient:  CHILD, ADULT Degree of support available:   Jonna Coup- ([phone]  June Brantley- (c) [phone]    CURRENT CONCERNS Current Concerns  Post-Acute Placement   Other Concerns:    SOCIAL WORK ASSESSMENT / PLAN Sw student met with pt and pts two daughters at bedside. Pt was asleep during the assessment. June and [name] (pts daughters) stated that pt has been a resident of [facility] in the SNF unit. Although the daughters and pt are happy with the care at [facility], they stated it was time to look for something new. SW student asked daughters if they were familiar with the bed search process and they both said yes. Both daughters stated that they would prefer [facility] because they have friends who have had a positive experience. Daughters stated when doing the bed search to exclude [facility], [facility], [place], [facility] and [facility]. Daughers also stated that if worse comes to worse, they would send pt back to [facility], but would prefer not to return. Daughters had a questions about payment at the SNF facility and SW student was not sure of the answer. SW Student made supervisor aware. SW student left the daughters with a bed list and SW Supervisors number with any questions. Daughters were thankful for  Student SWs visit.   Assessment/plan status:  Psychosocial Support/Ongoing Assessment of Needs Other assessment/ plan:   Information/referral to community resources:   Bed list Left with both daughters.    PATIENT'S/FAMILY'S RESPONSE TO PLAN OF CARE: Sw student met with pt and pts two daughters at bedside. Pt was asleep during the assessment. Daughters stated pt has been a resident of [facility], but would like to look at other facilities. Daughters are very involved and very peasant

## 2013-11-20 NOTE — Progress Notes (Signed)
Report given to receiving RN. Patient in bed sleeping. No signs or symptoms of distress or discomfort. 

## 2013-11-20 NOTE — Progress Notes (Signed)
[facility] PROGRESS NOTE Interim History: 80 y.o. male with multiple comorbidities including poor functional status, bedbound, history of tachybradycardia syndrome status post pacemaker implant, atrial fibrillation presently not on anticoagulation, chronic diastolic congestive heart failure, myasthenia gravis, history of frequent falls, stage 3/4 decubitus ulcer, indwelling Foley catheter who is currently a nursing home resident, transferred to the emergency department with complaints of increasing shortness of breath. Patient was diagnosed with upper respiratory tract infection at a skilled nursing facility for which he was treated with Avelox therapy. Despite this intervention he continued to have worsening shortness of breath with symptoms acutely worsening over the past 2-3 days. He has also experienced increasing cough, bilateral extremity pitting edema, orthopnea, brown sputum production and increase in oxygen demand. He denies chest pain, fevers, chills, nausea, vomiting, abdominal pain, recent falls, focal neurological deficits. In the emergency room he was found to have a BNP of 2774 with a chest x-ray showing findings suggestive of congestive heart failure. He was administered 40 mg of IV Lasix.   Filed Weights   11/19/13 1526 11/19/13 2026  Weight: 82.555 kg (182 lb) 84.7 kg (186 lb 11.7 oz)        Intake/Output Summary (Last 24 hours) at 11/20/13 2011 Last data filed at 11/20/13 1811  Gross per 24 hour  Intake   1126 ml  Output   1950 ml  Net   -824 ml     Assessment/Plan: 1-Acute resp failure with hypoxia: appears to be secondary to acute on chronic diastolic CHF (congestive heart failure), and bronchitis/PNA. -continue IV lasix -good diuresis overnight (almost 1L out) -will continue antibiotics (levaquin) for bronchitis/PNA -will start mucinex, pulmicort, flutter valve and PRN oxygen supplementation -daily weight, low sodium diet and strict I's and O's  2-chronic  Foley catheter use and UA suggesting UTI: significant purulent discharge into foley. -will switch to levaquin to cover urinary tract -follow urine cx's -exchange foley  3-Decubitus ulcer of sacral region, stage 4: wound care consulted. -overlay mattress and plastic surgery consultation  4-hypothyroidism: continue synthroid -TSH WNL  5-Atrial fibrillation: rate controlled and status post PPM-St.Jude placement. -not an anticoagulation candidate -cont ASA  6-orthostatic hypotension/AI: continue midodrine  7-myasthenia Gravis: continue mestinon. Stable.  8-GERD: continue PPI  9-severe protein calorie malnutrition: continue ensure and follow nutrition service rec's   Code Status: DNR Family Communication: daughter at bedside  Disposition Plan: SNF at discharge   Consultants:  Wound care and plastic surgery  Procedures: ECHO: last from 11/14 - Left ventricle: The cavity size was normal. Wall thickness was increased in a pattern of mild LVH. The estimated ejection fraction was 55%. Wall motion was normal; there were no regional wall motion abnormalities. Indeterminant diastolic function (atrial fibrillation). - Aortic valve: Trileaflet; moderately calcified leaflets. There was mild stenosis. Trivial regurgitation. Mean gradient: 16mm Hg (S). Peak gradient: 27mm Hg (S). - Aorta: Mildly dilated aortic root. Aortic root dimension: 39mm (ED). - Mitral valve: Mildly calcified annulus. Normal thickness leaflets . No significant regurgitation. - Left atrium: The atrium was moderately dilated. - Right ventricle: The cavity size was mildly dilated. It is not well-visualized, but I think there is a pacemaker in the RV. Systolic function was normal. - Right atrium: The atrium was moderately dilated. - Tricuspid valve: Peak RV-RA gradient:33mm Hg (S). - Pulmonary arteries: PA peak pressure: 54mm Hg (S). - Systemic veins: IVC not  visualized.  Antibiotics:  levaquin  HPI/Subjective: Afebrile, no complaining of CP, abd pain, nausea or vomiting. Patient is still  complaining of SOB (but endorses slight improvement); also with coughing spells and difficulty expectorating.   Objective: Filed Vitals:   11/20/13 1148 11/20/13 1441 11/20/13 1715 11/20/13 1827  BP: 140/90 88/49 106/69 132/68  Pulse: 95 65 98 71  Temp:  97.8 F (36.6 C)  98 F (36.7 C)  TempSrc:  Oral  Oral  Resp:  20  20  Height:      Weight:      SpO2:  100%  95%     Exam:  General: Alert, awake, oriented x3, hard of hearing; in mild acute distress, due to ongoing cough and SOB.  HEENT: No bruits, no goiter. Mild JVD Heart: Regular rate and rhythm, without murmurs, rubs, gallops.  Lungs: decrease BS at baseline, diffuse rhonchi Abdomen: Soft, nontender, nondistended, positive bowel sounds.  Skin: positive stage 4 decubitus ulcer, with thick yellowish drainage; otherwise clean and w/o periwound erythema. Neuro: Grossly intact, nonfocal.   Data Reviewed: Basic Metabolic Panel:  Recent Labs Lab 11/19/13 1535 11/20/13 0451  NA 142 142  K 5.1 4.6  CL 101 102  CO2 33* 33*  GLUCOSE 109* 99  BUN 23 20  CREATININE 0.72 0.61  CALCIUM 9.3 9.5   CBC:  Recent Labs Lab 11/19/13 1535 11/20/13 0451  WBC 7.6 6.0  NEUTROABS 4.8  --   HGB 10.3* 9.8*  HCT 34.2* 33.4*  MCV 102.4* 101.8*  PLT 181 162   Cardiac Enzymes:  Recent Labs Lab 11/19/13 1535  TROPONINI <0.30   BNP (last 3 results)  Recent Labs  07/02/13 1612 07/24/13 1549 11/19/13 1535  PROBNP 2389.0* 8586.0* 2774.0*    Recent Results (from the past 240 hour(s))  CULTURE, BLOOD (ROUTINE X 2)     Status: None   Collection Time    11/19/13  3:45 PM      Result Value Ref Range Status   Specimen Description BLOOD LEFT ARM   Final   Special Requests BOTTLES DRAWN AEROBIC ONLY 5CC   Final   Culture  Setup Time     Final   Value: 11/19/2013 19:08     Performed at  [facility]   Culture     Final   Value:        BLOOD CULTURE RECEIVED NO GROWTH TO DATE CULTURE WILL BE HELD FOR 5 DAYS BEFORE ISSUING A FINAL NEGATIVE REPORT     Performed at [facility]   Report Status PENDING   Incomplete  CULTURE, BLOOD (ROUTINE X 2)     Status: None   Collection Time    11/19/13  3:55 PM      Result Value Ref Range Status   Specimen Description BLOOD RIGHT FOREARM   Final   Special Requests BOTTLES DRAWN AEROBIC AND ANAEROBIC 5CC   Final   Culture  Setup Time     Final   Value: 11/19/2013 19:08     Performed at [facility]   Culture     Final   Value:        BLOOD CULTURE RECEIVED NO GROWTH TO DATE CULTURE WILL BE HELD FOR 5 DAYS BEFORE ISSUING A FINAL NEGATIVE REPORT     Performed at [facility]   Report Status PENDING   Incomplete  MRSA PCR SCREENING     Status: Abnormal   Collection Time    11/19/13 11:26 PM      Result Value Ref Range Status   MRSA by PCR POSITIVE (*) NEGATIVE Final  Comment:            The GeneXpert MRSA Assay (FDA     approved for NASAL specimens     only), is one component of a     comprehensive MRSA colonization     surveillance program. It is not     intended to diagnose MRSA     infection nor to guide or     monitor treatment for     MRSA infections.     RESULT CALLED TO, READ BACK BY AND VERIFIED WITH:     LEBRON,Y RN 0147 11/20/13 MITCHELL,L     Studies: Dg Chest 2 View  11/20/2013   CLINICAL DATA:  CHF  EXAM: CHEST  2 VIEW  COMPARISON:  11/19/2013  FINDINGS: Left subclavian transvenous pacemaker leads project at right atrium and right ventricle.  Enlargement of cardiac silhouette with pulmonary vascular congestion.  Persistent elevation left diaphragm with bibasilar atelectasis.  Mild pulmonary edema consistent with CHF, little changed.  No gross pleural effusion or pneumothorax.  IMPRESSION: Mild CHF with bibasilar atelectasis.   Electronically Signed   By: Ulyses SouthwardMark  Boles M.D.   On:  11/20/2013 08:15   Dg Chest 2 View  11/19/2013   CLINICAL DATA:  Cough and shortness of Breath  EXAM: CHEST  2 VIEW  COMPARISON:  07/27/2013  FINDINGS: There is a left chest wall pacer device with lead in the right atrial appendage and right ventricle. Moderate cardiac enlargement is identified. There are moderate volume bilateral pleural effusions and interstitial edema consistent with CHF. Atelectasis is noted in the lung bases.  IMPRESSION: 1. Cardiac enlargement and congestive heart failure.   Electronically Signed   By: Signa Kellaylor  Stroud M.D.   On: 11/19/2013 16:54    Scheduled Meds: . aspirin  81 mg Oral Daily  . budesonide (PULMICORT) nebulizer solution  0.25 mg Nebulization BID  . [START ON 11/21/2013] Chlorhexidine Gluconate Cloth  6 each Topical Q0600  . dorzolamide-timolol  1 drop Both Eyes Daily  . enoxaparin (LOVENOX) injection  40 mg Subcutaneous Q24H  . feeding supplement (ENSURE)  1 Container Oral BID BM  . feeding supplement (PRO-STAT SUGAR FREE 64)  30 mL Oral TID WC  . finasteride  5 mg Oral Daily  . furosemide  40 mg Intravenous BID  . guaiFENesin  600 mg Oral BID  . levofloxacin  750 mg Oral Daily  . levothyroxine  175 mcg Oral QAC breakfast  . midodrine  10 mg Oral TID WC  . mirtazapine  15 mg Oral QHS  . mupirocin ointment   Nasal BID  . pantoprazole  40 mg Oral Daily  . pyridostigmine  60 mg Oral TID WC  . saccharomyces boulardii  250 mg Oral BID  . sodium chloride  3 mL Intravenous Q12H   Continuous Infusions:   Time : > 30 minutes  Vassie Lollarlos Vollie Aaron  [facility] Pager [phone]. If 8PM-8AM, please contact night-coverage at [email or web address], password [email or web address] 11/20/2013, 8:11 PM  LOS: 1 day

## 2013-11-20 NOTE — Progress Notes (Signed)
INITIAL NUTRITION ASSESSMENT  DOCUMENTATION CODES Per approved criteria  -Not Applicable   INTERVENTION: - Ensure Complete po BID, each supplement provides 350 kcal and 13 grams of protein - Prostat 30 mL TID, each supplement provides 100 kcal and 15 g of protein.  NUTRITION DIAGNOSIS: Increased nutrient needs related to wound healing as evidenced by stage 3-4 pressure ulcer.  Goal: Pt to meet >/= 90% of their estimated nutrition needs   Monitor:  Wt, po intake, acceptance of supplements, wound healing  Reason for Assessment: Low Braden  80 y.o. male  Admitting Dx: Acute on chronic diastolic CHF (congestive heart failure), NYHA class 1  ASSESSMENT: 80 y.o. male with multiple comorbidities including poor functional status, bedbound, history of tachybradycardia syndrome status post pacemaker implant, atrial fibrillation presently not on anticoagulation, chronic diastolic congestive heart failure, myasthenia gravis, history of frequent falls, stage 3/4 decubitus ulcer, indwelling Foley catheter who is currently a nursing home resident, transferred to the emergency department with complaints of increasing shortness of breath.   Pt was ordering meal during RD visit. Per pt's daughter, pt has been eating well up until the last couple of days when he started feeling bad. He is eating better and has been feeling hungry since coming to the hospital. Pt's daughter reports that pt had lost from ~200 lbs down to 143 lbs, but has since gained back much of the weight. Pt says that he drinks Ensure drink at SNF.   Height: Ht Readings from Last 1 Encounters:  11/19/13 6\' 2"  (1.88 m)    Weight: Wt Readings from Last 1 Encounters:  11/19/13 186 lb 11.7 oz (84.7 kg)    Ideal Body Weight: 82.2 kg  % Ideal Body Weight: 103%  Wt Readings from Last 10 Encounters:  11/19/13 186 lb 11.7 oz (84.7 kg)  10/26/13 172 lb (78.019 kg)  07/30/13 143 lb (64.864 kg)  07/12/13 175 lb (79.379 kg)   06/26/13 202 lb 12.8 oz (91.989 kg)  06/08/13 207 lb (93.895 kg)  03/19/13 206 lb 12.8 oz (93.804 kg)  11/13/12 208 lb (94.348 kg)  10/06/12 214 lb (97.07 kg)  08/07/12 214 lb (97.07 kg)    Usual Body Weight: ~200 lbs  % Usual Body Weight: 93%  BMI:  Body mass index is 23.96 kg/(m^2).  Estimated Nutritional Needs: Kcal: 2000-2200 Protein: 100-110 Fluid: 2.0-2.2 L/day  Skin: Stage III-IV wound on buttocks  Diet Order: Cardiac  EDUCATION NEEDS: -Education needs addressed   Intake/Output Summary (Last 24 hours) at 11/20/13 1020 Last data filed at 11/20/13 0908  Gross per 24 hour  Intake    646 ml  Output    850 ml  Net   -204 ml    Last BM: none recorded   Labs:   Recent Labs Lab 11/19/13 1535 11/20/13 0451  NA 142 142  K 5.1 4.6  CL 101 102  CO2 33* 33*  BUN 23 20  CREATININE 0.72 0.61  CALCIUM 9.3 9.5  GLUCOSE 109* 99    CBG (last 3)  No results found for this basename: GLUCAP,  in the last 72 hours  Scheduled Meds: . aspirin  81 mg Oral Daily  . budesonide (PULMICORT) nebulizer solution  0.25 mg Nebulization BID  . dorzolamide-timolol  1 drop Both Eyes Daily  . enoxaparin (LOVENOX) injection  40 mg Subcutaneous Q24H  . feeding supplement (ENSURE)  1 Container Oral BID BM  . feeding supplement (PRO-STAT SUGAR FREE 64)  30 mL Oral TID WC  .  finasteride  5 mg Oral Daily  . furosemide  40 mg Intravenous BID  . guaiFENesin  600 mg Oral BID  . levofloxacin  750 mg Oral Daily  . levothyroxine  175 mcg Oral QAC breakfast  . midodrine  10 mg Oral TID  . mirtazapine  15 mg Oral QHS  . pantoprazole  40 mg Oral Daily  . pyridostigmine  60 mg Oral TID WC  . saccharomyces boulardii  250 mg Oral BID  . sodium chloride  3 mL Intravenous Q12H    Continuous Infusions:   Past Medical History  Diagnosis Date  . Long-term (current) use of anticoagulants   . Glaucoma   . Gout   . Hyperthyroidism   . Orthostatic hypotension   . Redundant prepuce and  phimosis   . Acute cystitis   . Hypertonicity of bladder   . HTN (hypertension)   . Atelectasis   . Dysautonomia   . Pacemaker- [facility] [employer]     DOI 2012  . CHF (congestive heart failure)     Previous preserved EF.  [name]. Atrial fibrillation   . Sinus node dysfunction   . Pneumonia 08/2011    after hernia OR  . Hypothyroidism   . Sleep apnea     does not use cpap ; last used ~ 2011 (03/10/12)  . Spinal stenosis of lumbar region   . Chronic kidney disease [facility]     UTI  1 MO AGO TX MEDICALLY   . Osteomyelitis of toe of right foot ~ 2009    amputated 2nd toe  . Hypothyroid   . Glaucoma     Past Surgical History  Procedure Laterality Date  . Elbow bursa surgery  04/07/2006    Right elbow septic olecranon bursitis  . Toe amputation  ~ 2009    Osteomyelitis, right foot second toe /  Right foot second ray amputation  . Cardiac catheterization  07/03/2002    EF 60-70% /  Normal left ventricular function. / Very mild trivial three-vessel coronary atherosclerosis. / There is no mitral regurgitation noted  . Tonsillectomy and adenoidectomy      "when I was a youngster"  . Appendectomy      "when I was a youngster"  . Inguinal hernia repair  ~ 2003/13    right  . Cataract extraction w/ intraocular lens  implant, bilateral  01/2005    Ebbie LatusHaley Hawkins RD, LDN

## 2013-11-20 NOTE — Progress Notes (Signed)
Utilization Review Completed Chenay Nesmith J. Jailynn Lavalais, RN, BSN, NCM [phone]  

## 2013-11-20 NOTE — Care Management Note (Addendum)
    Page 1 of 2   11/22/2013     3:17:14 PM   CARE MANAGEMENT NOTE 11/22/2013  Patient:  Marco Gregory,Marco Gregory   Account Number:  [id number]  Date Initiated:  11/20/2013  Documentation initiated by:  [facility]Nicolas Banh  Subjective/Objective Assessment:   80 yo male admitted with CHF, Acute hypoxemic respiratory failure, UTI and Stage III/IV decubitus ulcer///From [facility]     Action/Plan:   IV abx, adjust O2 as needed, adx, wound care consult.//HF screen   Anticipated DC Date:  11/23/2013   Anticipated DC Plan:  HOME W HOSPICE CARE  In-house referral  Clinical Social Worker      DC Associate Professorlanning Services  CM consult      PAC Choice  HOSPICE   Choice offered to / List presented to:     DME arranged  AIR OVERLAY MATTRESS  NEBULIZER MACHINE  NEBULIZER/MEDS  OXYGEN  SPECIALTY BED  OTHER - SEE COMMENT      DME agency  [facility]     HH arranged  HH[facility] RN      [facility]   Status of service:  In process, will continue to follow Medicare Important Message given?   (If response is "NO", the following Medicare IM given date fields will be blank) Date Medicare IM given:   Date Additional Medicare IM given:    Discharge Disposition:    Per UR Regulation:  Reviewed for med. necessity/level of care/duration of stay  If discussed at Long Length of Stay Meetings, dates discussed:    Comments:  11/22/13 1300 Oletta Cohnamellia Limmie Schoenberg, RN, BSN, [employer] [phone] Spoke with pt daughter, [name] regarding disposition from hospital.  Family and pt given list of Home Hospice agencies to choose from.  [facility]. Valente DavidMargie Lester of [facility] notified and will follow up with pt and daughter. DME requested: long bed, air mattress, overbed table, hoyer lift, O2 nebulizer and foley supplies.  Jermaine of [facility] notifed of DME list and will await directon from Valente DavidMargie Lester before  ordering.  11/21/2013 DTR/June provided with Private Duty Sitter List. Patient/Family Consult pending with Dr. Ladona Ridgelaylor to discuss SNF v Home Hospice v IP/[facility] Options. Crytal Hutchinson RN, BSN, MSHL, CCM 11/21/2013   11/20/13 1130 Atlas Kuc TrivoliWood, RNm BSNm NCM [phone] Contact: Owen-Edmonds,Joyce Daughter [phone]

## 2013-11-20 NOTE — Progress Notes (Signed)
Pt admitted to 3E30 from ER came, by stretcher, VSS, denies any pain or SOB at this time, [facility] a chronic foley catheter in place. Daughter refers, foley is been in place with out been change for more that 4 months, MD notified, order to not remove foley until nephrology consult gotten from Maren ReamerKaren Kirby, drain bag changed and UA sample collected as ordered. Pt having some pressure ulcer on admission, assessment completed and dressing changed as unit protocol. Medications given as scheduled. Pt oriented to the unit and to his room, bed alarm in place for fall precaution, HF pathway initiated, pt oriented about daily weight, fluid restriction and diet. We'll continue with POC.

## 2013-11-21 ENCOUNTER — Encounter (HOSPITAL_COMMUNITY): Payer: Self-pay | Admitting: Physician Assistant

## 2013-11-21 ENCOUNTER — Telehealth: Payer: Self-pay | Admitting: Cardiology

## 2013-11-21 DIAGNOSIS — Z515 Encounter for palliative care: Secondary | ICD-10-CM

## 2013-11-21 DIAGNOSIS — J441 Chronic obstructive pulmonary disease with (acute) exacerbation: Secondary | ICD-10-CM

## 2013-11-21 LAB — BASIC METABOLIC PANEL
BUN: 23 mg/dL (ref 6–23)
CO2: 33 mEq/L — ABNORMAL HIGH (ref 19–32)
Calcium: 9.3 mg/dL (ref 8.4–10.5)
Chloride: 98 mEq/L (ref 96–112)
Creatinine, Ser: 0.81 mg/dL (ref 0.50–1.35)
GFR, EST AFRICAN AMERICAN: 88 mL/min — AB (ref >90)
GFR, EST NON AFRICAN AMERICAN: 76 mL/min — AB (ref >90)
Glucose, Bld: 87 mg/dL (ref 70–99)
POTASSIUM: 4.2 meq/L (ref 3.7–5.3)
Sodium: 141 mEq/L (ref 137–147)

## 2013-11-21 LAB — FERRITIN: Ferritin: 169 ng/mL (ref 22–322)

## 2013-11-21 LAB — RETICULOCYTES
RBC.: 3.17 MIL/uL — ABNORMAL LOW (ref 4.22–5.81)
RETIC COUNT ABSOLUTE: 34.9 10*3/uL (ref 19.0–186.0)
Retic Ct Pct: 1.1 % (ref 0.4–3.1)

## 2013-11-21 LAB — URINE CULTURE: Colony Count: >=100000

## 2013-11-21 LAB — FOLATE: FOLATE: 15.8 ng/mL

## 2013-11-21 LAB — VITAMIN B12: Vitamin B-12: 552 pg/mL (ref 211–911)

## 2013-11-21 MED ORDER — FUROSEMIDE 20 MG PO TABS
20.0000 mg | ORAL_TABLET | ORAL | Status: DC
Start: 2013-11-22 — End: 2013-11-22
  Administered 2013-11-22: 20 mg via ORAL
  Filled 2013-11-21: qty 1

## 2013-11-21 MED ORDER — DAKINS (1/2 STRENGTH) 0.25 % EX SOLN
Freq: Two times a day (BID) | CUTANEOUS | Status: DC
  Administered 2013-11-21 – 2013-11-23 (×5)
  Filled 2013-11-21: qty 473

## 2013-11-21 MED ORDER — FUROSEMIDE 20 MG PO TABS: 20.0000 mg | ORAL_TABLET | ORAL | Status: DC

## 2013-11-21 NOTE — Progress Notes (Signed)
Report given to receiving RN. Patient in bed sleeping. No sings or symptoms of distress or discomfort.

## 2013-11-21 NOTE — Progress Notes (Addendum)
[facility] PROGRESS NOTE Interim History: 80 y.o. male with multiple comorbidities including poor functional status, bedbound, history of tachybradycardia syndrome status post pacemaker implant, atrial fibrillation presently not on anticoagulation, chronic diastolic congestive heart failure, myasthenia gravis, history of frequent falls, stage 3/4 decubitus ulcer, indwelling Foley catheter who is currently a nursing home resident, transferred to the emergency department with complaints of increasing shortness of breath. Patient was diagnosed with upper respiratory tract infection at a skilled nursing facility for which he was treated with Avelox therapy. Despite this intervention he continued to have worsening shortness of breath with symptoms acutely worsening over the past 2-3 days. He has also experienced increasing cough, bilateral extremity pitting edema, orthopnea, brown sputum production and increase in oxygen demand. He denies chest pain, fevers, chills, nausea, vomiting, abdominal pain, recent falls, focal neurological deficits. In the emergency room he was found to have a BNP of 2774 with a chest x-ray showing findings suggestive of congestive heart failure.   Assessment/Plan: Acute resp failure with hypoxia: appears to be secondary to acute on chronic diastolic CHF (congestive heart failure), and bronchitis: - Hold IV lasix  - good diuresis overnight. - Will continue antibiotics (levaquin) for bronchitis/PNA  - Will start mucinex, pulmicort, flutter valve and PRN oxygen supplementation  - Daily weight, low sodium diet and strict I's and O's   Chronic Foley catheter use and UA suggesting UTI:  - Significant purulent discharge into foley.  - Will switch to levaquin to cover urinary tract  - Follow urine cx's  - Exchange foley   Decubitus ulcer of sacral region, stage 4: wound care consulted.  - Overlay mattress and plastic surgery consultation rec poor surgical candidate at  this time given multiple medical problems. Would continue local wound care. Would recommend using Dakin's solution for a week to help with debridement of area and control of purulent drainage - pt is bed bound, low chances of ulcer healing at the age of 80 with protein malnutrition and other co-morbidities. - After explaining this to him he will like to meet with PMT.  Hypothyroidism:  - continue synthroid  -TSH WNL   Atrial fibrillation: rate controlled and status post PPM-St.Jude placement.  - not an anticoagulation candidate  - cont ASA   Orthostatic hypotension/AI:  - continue midodrine   myasthenia Gravis:  - continue mestinon. Stable.   Severe protein malnutrition:    Code Status: DNR  Family Communication: daughter at bedside  Disposition Plan: SNF at discharge    Consultants:  WOC  Plastic surgery  Procedures: ECHO: last from 11/14  - Left ventricle: The cavity size was normal. Wall thickness was increased in a pattern of mild LVH. The estimated ejection fraction was 55%. Wall motion was normal; there were no regional wall motion abnormalities. Indeterminant diastolic function (atrial fibrillation). - Aortic valve: Trileaflet; moderately calcified leaflets. There was mild stenosis. Trivial regurgitation. Mean gradient: 16mm Hg (S). Peak gradient: 27mm Hg (S). - Aorta: Mildly dilated aortic root. Aortic root dimension: 39mm (ED). - Mitral valve: Mildly calcified annulus. Normal thickness leaflets . No significant regurgitation. - Left atrium: The atrium was moderately dilated. - Right ventricle: The cavity size was mildly dilated. It is not well-visualized, but I think there is a pacemaker in the RV. Systolic function was normal. - Right atrium: The atrium was moderately dilated. - Tricuspid valve: Peak RV-RA gradient:5344mm Hg (S). - Pulmonary arteries: PA peak pressure: 54mm Hg (S). - Systemic veins: IVC not  visualized.   Antibiotics:  levaquin  HPI/Subjective: Productive cough  Objective: Filed Vitals:   11/21/13 0420 11/21/13 0855 11/21/13 0941 11/21/13 1230  BP: 92/64 99/32  93/56  Pulse: 76 82  74  Temp: 97.8 F (36.6 C)     TempSrc: Oral     Resp: 20     Height:      Weight: 79.1 kg (174 lb 6.1 oz)     SpO2: 100%  95%     Intake/Output Summary (Last 24 hours) at 11/21/13 1302 Last data filed at 11/21/13 0834  Gross per 24 hour  Intake    543 ml  Output   2475 ml  Net  -1932 ml   Filed Weights   11/19/13 1526 11/19/13 2026 11/21/13 0420  Weight: 82.555 kg (182 lb) 84.7 kg (186 lb 11.7 oz) 79.1 kg (174 lb 6.1 oz)    Exam:  General: Alert, awake, oriented x3, in no acute distress.  HEENT: No bruits, no goiter. -JVD Heart: Regular rate and rhythm, without murmurs, rubs, gallops.  Lungs: Good air movement, clear Abdomen: Soft, nontender, nondistended, positive bowel sounds.   Data Reviewed: Basic Metabolic Panel:  Recent Labs Lab 11/19/13 1535 11/20/13 0451 11/21/13 0303  NA 142 142 141  K 5.1 4.6 4.2  CL 101 102 98  CO2 33* 33* 33*  GLUCOSE 109* 99 87  BUN 23 20 23   CREATININE 0.72 0.61 0.81  CALCIUM 9.3 9.5 9.3   Liver Function Tests: No results found for this basename: AST, ALT, ALKPHOS, BILITOT, PROT, ALBUMIN,  in the last 168 hours No results found for this basename: LIPASE, AMYLASE,  in the last 168 hours No results found for this basename: AMMONIA,  in the last 168 hours CBC:  Recent Labs Lab 11/19/13 1535 11/20/13 0451  WBC 7.6 6.0  NEUTROABS 4.8  --   HGB 10.3* 9.8*  HCT 34.2* 33.4*  MCV 102.4* 101.8*  PLT 181 162   Cardiac Enzymes:  Recent Labs Lab 11/19/13 1535  TROPONINI <0.30   BNP (last 3 results)  Recent Labs  07/02/13 1612 07/24/13 1549 11/19/13 1535  PROBNP 2389.0* 8586.0* 2774.0*   CBG: No results found for this basename: GLUCAP,  in the last 168 hours  Recent Results (from the past 240 hour(s))  CULTURE,  BLOOD (ROUTINE X 2)     Status: None   Collection Time    11/19/13  3:45 PM      Result Value Ref Range Status   Specimen Description BLOOD LEFT ARM   Final   Special Requests BOTTLES DRAWN AEROBIC ONLY 5CC   Final   Culture  Setup Time     Final   Value: 11/19/2013 19:08     Performed at [facility]   Culture     Final   Value:        BLOOD CULTURE RECEIVED NO GROWTH TO DATE CULTURE WILL BE HELD FOR 5 DAYS BEFORE ISSUING A FINAL NEGATIVE REPORT     Performed at [facility]   Report Status PENDING   Incomplete  CULTURE, BLOOD (ROUTINE X 2)     Status: None   Collection Time    11/19/13  3:55 PM      Result Value Ref Range Status   Specimen Description BLOOD RIGHT FOREARM   Final   Special Requests BOTTLES DRAWN AEROBIC AND ANAEROBIC 5CC   Final   Culture  Setup Time     Final   Value: 11/19/2013 19:08  Performed at [facility]   Culture     Final   Value:        BLOOD CULTURE RECEIVED NO GROWTH TO DATE CULTURE WILL BE HELD FOR 5 DAYS BEFORE ISSUING A FINAL NEGATIVE REPORT     Performed at [facility]   Report Status PENDING   Incomplete  URINE CULTURE     Status: None   Collection Time    11/19/13 11:25 PM      Result Value Ref Range Status   Specimen Description URINE, RANDOM   Final   Special Requests NONE   Final   Culture  Setup Time     Final   Value: 11/20/2013 02:15     Performed at [facility]   Colony Count     Final   Value: >=100,000 COLONIES/ML     Performed at [facility]   Culture     Final   Value: Multiple bacterial morphotypes present, none predominant. Suggest appropriate recollection if clinically indicated.     Performed at [facility]   Report Status 11/21/2013 FINAL   Final  MRSA PCR SCREENING     Status: Abnormal   Collection Time    11/19/13 11:26 PM      Result Value Ref Range Status   MRSA by PCR POSITIVE (*) NEGATIVE Final   Comment:            The GeneXpert MRSA Assay (FDA      approved for NASAL specimens     only), is one component of a     comprehensive MRSA colonization     surveillance program. It is not     intended to diagnose MRSA     infection nor to guide or     monitor treatment for     MRSA infections.     RESULT CALLED TO, READ BACK BY AND VERIFIED WITH:     LEBRON,Y RN 0147 11/20/13 MITCHELL,L     Studies: Dg Chest 2 View  11/20/2013   CLINICAL DATA:  CHF  EXAM: CHEST  2 VIEW  COMPARISON:  11/19/2013  FINDINGS: Left subclavian transvenous pacemaker leads project at right atrium and right ventricle.  Enlargement of cardiac silhouette with pulmonary vascular congestion.  Persistent elevation left diaphragm with bibasilar atelectasis.  Mild pulmonary edema consistent with CHF, little changed.  No gross pleural effusion or pneumothorax.  IMPRESSION: Mild CHF with bibasilar atelectasis.   Electronically Signed   By: Ulyses SouthwardMark  Boles M.D.   On: 11/20/2013 08:15   Dg Chest 2 View  11/19/2013   CLINICAL DATA:  Cough and shortness of Breath  EXAM: CHEST  2 VIEW  COMPARISON:  07/27/2013  FINDINGS: There is a left chest wall pacer device with lead in the right atrial appendage and right ventricle. Moderate cardiac enlargement is identified. There are moderate volume bilateral pleural effusions and interstitial edema consistent with CHF. Atelectasis is noted in the lung bases.  IMPRESSION: 1. Cardiac enlargement and congestive heart failure.   Electronically Signed   By: Signa Kellaylor  Stroud M.D.   On: 11/19/2013 16:54    Scheduled Meds: . aspirin  81 mg Oral Daily  . budesonide (PULMICORT) nebulizer solution  0.25 mg Nebulization BID  . Chlorhexidine Gluconate Cloth  6 each Topical Q0600  . dorzolamide-timolol  1 drop Both Eyes Daily  . enoxaparin (LOVENOX) injection  40 mg Subcutaneous Q24H  . feeding supplement (ENSURE)  1 Container Oral BID BM  .  feeding supplement (PRO-STAT SUGAR FREE 64)  30 mL Oral TID WC  . finasteride  5 mg Oral Daily  . furosemide  40 mg  Intravenous BID  . guaiFENesin  600 mg Oral BID  . levofloxacin  750 mg Oral Daily  . levothyroxine  175 mcg Oral QAC breakfast  . midodrine  10 mg Oral TID WC  . mirtazapine  15 mg Oral QHS  . mupirocin ointment   Nasal BID  . pantoprazole  40 mg Oral Daily  . pyridostigmine  60 mg Oral TID WC  . saccharomyces boulardii  250 mg Oral BID  . sodium chloride  3 mL Intravenous Q12H  . sodium hypochlorite   Irrigation BID   Continuous Infusions:  Time spent with pt from 1:02pm-1:35pm, 3:00pm-4:15pm  Marinda Elk  [facility] Pager [phone]. If 8PM-8AM, please contact night-coverage at [email or web address], password [facility] 11/21/2013, 1:02 PM  LOS: 2 days

## 2013-11-21 NOTE — Consult Note (Signed)
Patient Marco Gregory      DOB: [birth year]      VPX:[id number]     Consult Note from the Palliative Medicine Team at [facility] Requested by: Dr. Aileen Gregory     PCP: Marco Haber, MD Reason for Consultation: Goals of c    Phone Number:[phone]  Assessment of patients Current state: 80 yr old white male who is known to our service from an admission several months ago.  Returns to the hospital with exacerbation of CHF and continued decline.  In November he was deemed a candidate for hospice care based on his CHF and wound but his daughter decided to try to rehabilate him at [facility] where he was with his wife.  His wife died earlier in the year and Marco Gregory has continued to decline.  He has not been eating.  He is confined to his bed because of orthostatic hypotension and general debility.  HIs wound is now likely infected.  I met with the patient and his two daughters.  The patient has clearly stated to his daughters " I have run out of gas".  The patient is intermittently confused but his daughter Marco Gregory patiently tries to include him in decision making.  We talked further about hospice care at home which is the direction that they are leaning .  They wanted to talk about it over night.  We agreed to meet in the am at 1030 for further conversation.  I told them I would speech with speech and plastics to make sure we clearly had all of our options in place.   Patient expressed that he remians unable to clear the mucous in his throat and at times feels that he can't swallow because of it. His relates that his pain is intermittent and controlled with pills or "the shot".   Goals of Care: 1.  Code Status: DNR   2. Scope of Treatment: Continue with treatment for chronic illnesses like CHF , and aggressive wound care with antibiotics.  4. Disposition: to be determined.  Marco Gregory is considering home to her house with 24/7 assistance and hospice care.   3. Symptom  Management:   1. Pain: prn oral opiates supplement with IV opiates seems to be controlling his pain. 2. Bowel Regimen: monitor. Trying to balance ease of stooling without getting wound contaminated. Senna with prn dulcolax. 3. Delirium:  High risk because of age and chronic illness 4. Continue aggressive local wound care  4. Psychosocial: patient recently lost his wife.  His two daughters are caring for him now.  5. Spiritual: always open to spiritual care visit  Patient Documents Completed or Given: Document Given Completed  Advanced Directives Pkt    MOST    DNR    Gone from My Sight    Hard Choices      Brief HPI: 80 yr old white male with known CHF, orthostatic hypotension, and large sacral decubitus.  He returns with CHF exacerbation and probably wound infection.We have been asked to assist with Goals of care.   ROS: hoarseness, intermittent trouble swallowing , mucous, lower back pain,intermittent dyspnea.  Bed bound    PMH:  Past Medical History  Diagnosis Date  . Long-term (current) use of anticoagulants   . Glaucoma   . Gout   . Hyperthyroidism   . Orthostatic hypotension   . Redundant prepuce and phimosis   . Acute cystitis   . Hypertonicity of bladder   . HTN (hypertension)   .  Atelectasis   . Dysautonomia   . Pacemaker- [place] 2012  . CHF (congestive heart failure)     Previous preserved EF.  [name] Atrial fibrillation   . Sinus node dysfunction   . Pneumonia 08/2011    after hernia OR  . Hypothyroidism   . Sleep apnea     does not use cpap ; last used ~ 2011 (03/10/12)  . Spinal stenosis of lumbar region   . Chronic kidney disease Marco Gregory     UTI  1 MO AGO TX MEDICALLY   . Osteomyelitis of toe of right foot ~ 2009    amputated 2nd toe  . Hypothyroid   . Glaucoma      PSH: Past Surgical History  Procedure Laterality Date  . Elbow bursa surgery  04/07/2006    Right elbow septic olecranon bursitis  . Toe amputation  ~ 2009     Osteomyelitis, right foot second toe /  Right foot second ray amputation  . Cardiac catheterization  07/03/2002    EF 60-70% /  Normal left ventricular function. / Very mild trivial three-vessel coronary atherosclerosis. / There is no mitral regurgitation noted  . Tonsillectomy and adenoidectomy      "when I was a youngster"  . Appendectomy      "when I was a youngster"  . Inguinal hernia repair  ~ 2003/13    right  . Cataract extraction w/ intraocular lens  implant, bilateral  01/2005   I have reviewed the FH and SH and  If appropriate update it with new information. No Known Allergies Scheduled Meds: . aspirin  81 mg Oral Daily  . budesonide (PULMICORT) nebulizer solution  0.25 mg Nebulization BID  . Chlorhexidine Gluconate Cloth  6 each Topical Q0600  . dorzolamide-timolol  1 drop Both Eyes Daily  . enoxaparin (LOVENOX) injection  40 mg Subcutaneous Q24H  . feeding supplement (ENSURE)  1 Container Oral BID BM  . feeding supplement (PRO-STAT SUGAR FREE 64)  30 mL Oral TID WC  . finasteride  5 mg Oral Daily  . [START ON 11/22/2013] furosemide  20 mg Oral QODAY  . guaiFENesin  600 mg Oral BID  . levofloxacin  750 mg Oral Daily  . levothyroxine  175 mcg Oral QAC breakfast  . midodrine  10 mg Oral TID WC  . mirtazapine  15 mg Oral QHS  . mupirocin ointment   Nasal BID  . pantoprazole  40 mg Oral Daily  . pyridostigmine  60 mg Oral TID WC  . saccharomyces boulardii  250 mg Oral BID  . sodium chloride  3 mL Intravenous Q12H  . sodium hypochlorite   Irrigation BID   Continuous Infusions:  PRN Meds:.sodium chloride, albuterol, bisacodyl, nitroGLYCERIN, sodium chloride    BP 126/75  Pulse 84  Temp(Src) 99 F (37.2 C) (Oral)  Resp 18  Ht 6' 2" (1.88 m)  Wt 79.1 kg (174 lb 6.1 oz)  BMI 22.38 kg/m2  SpO2 100%   PPS: 30%   Intake/Output Summary (Last 24 hours) at 11/21/13 1846 Last data filed at 11/21/13 1335  Gross per 24 hour  Intake    663 ml  Output   1375 ml  Net    -712 ml    Physical Exam:  General: awake ,alert but hard of hearing,  Intermittently confused HEENT:  PERRL, EOMI, anicteric, mmm, hoarse  Chest:   Decreased with some basilar rhonchi, no wheezing CVS: regular, S1,S2 Abdomen: schaphoid,  soft not tender Ext: pale, cool, no mottling, trace edema Neuro:awake , alert oriented to self and family,  Hard of dearing  Labs: CBC    Component Value Date/Time   WBC 6.0 11/20/2013 0451   WBC 7.5 06/07/2012 1607   RBC 3.17* 11/21/2013 1050   RBC 3.28* 11/20/2013 0451   RBC 4.52* 06/07/2012 1607   HGB 9.8* 11/20/2013 0451   HGB 13.7* 06/07/2012 1607   HCT 33.4* 11/20/2013 0451   HCT 45.2 06/07/2012 1607   PLT 162 11/20/2013 0451   MCV 101.8* 11/20/2013 0451   MCV 99.9* 06/07/2012 1607   MCH 29.9 11/20/2013 0451   MCH 30.3 06/07/2012 1607   MCHC 29.3* 11/20/2013 0451   MCHC 30.3* 06/07/2012 1607   RDW 15.5 11/20/2013 0451   LYMPHSABS 1.6 11/19/2013 1535   MONOABS 1.0 11/19/2013 1535   EOSABS 0.2 11/19/2013 1535   BASOSABS 0.0 11/19/2013 1535     CMP     Component Value Date/Time   NA 141 11/21/2013 0303   K 4.2 11/21/2013 0303   CL 98 11/21/2013 0303   CO2 33* 11/21/2013 0303   GLUCOSE 87 11/21/2013 0303   BUN 23 11/21/2013 0303   CREATININE 0.81 11/21/2013 0303   CREATININE 1.06 06/07/2012 1603   CALCIUM 9.3 11/21/2013 0303   PROT 6.4 07/03/2013 0615   ALBUMIN 2.7* 07/03/2013 0615   AST 22 07/03/2013 0615   ALT 14 07/03/2013 0615   ALKPHOS 84 07/03/2013 0615   BILITOT 0.9 07/03/2013 0615   GFRNONAA 76* 11/21/2013 0303   GFRAA 88* 11/21/2013 0303    Chest Xray Reviewed/Impressions:Mild CHF with bibasilar atelectasis.    Time In Time Out Total Time Spent with Patient Total Overall Time  500 pm 600  pm 60 min 60 min    Greater than 50%  of this time was spent counseling and coordinating care related to the above assessment and plan.  Briceyda Abdullah L. Lovena Le, MD MBA The Palliative Medicine Team at [facility] Phone: [phone] Pager:  [phone]

## 2013-11-21 NOTE — Telephone Encounter (Signed)
InBasket message to Dr Ethel RanaBrackbill--will forward.

## 2013-11-21 NOTE — Telephone Encounter (Signed)
Please close this encounter

## 2013-11-21 NOTE — Telephone Encounter (Signed)
New message          Pt daughter called to let dr Patty Sermonsbrackbill to know that her father is currently in the hospital ([facility]) for congestive heart failure. Pt is aware Juliette AlcideMelinda is not here so please call her when you return.

## 2013-11-21 NOTE — Consult Note (Signed)
Reason for Consult:Stage IV sacral ulcer Referring Physician: Dr. Aileen Fass  Marco Gregory is an 80 y.o. male.  HPI: Marco Gregory is a very pleasant, alert 80 y.o. male with multiple comorbidities including poor functional status, bedbound, protein calorie malnutrition, history of tachybradycardia syndrome status post pacemaker implant, atrial fibrillation presently not on anticoagulation, acute on chronic diastolic congestive heart failure, myasthenia gravis, history of frequent falls, stage IV sacral decubitus ulcer, indwelling Foley catheter who is currently a nursing home resident, transferred to the emergency department with complaints of increasing shortness of breath. He has been admitted with acute on chronic CHF.  We are asked to evaluate his sacral decubitus ulceration as the wound edges now have severe epibole following NPWT at the skilled nursing facility.  The wound measures 4 x 3 x 3.5 cm with 4 cm of circumferential undermining with exposed bone and poor granulation with severe epibole of the wound edges. There is purulent green drainage from the area today.  Past Medical History  Diagnosis Date  . Long-term (current) use of anticoagulants   . Glaucoma   . Gout   . Hyperthyroidism   . Orthostatic hypotension   . Redundant prepuce and phimosis   . Acute cystitis   . Hypertonicity of bladder   . HTN (hypertension)   . Atelectasis   . Dysautonomia   . Pacemaker- [place] 2012  . CHF (congestive heart failure)     Previous preserved EF.  [name] Atrial fibrillation   . Sinus node dysfunction   . Pneumonia 08/2011    after hernia OR  . Hypothyroidism   . Sleep apnea     does not use cpap ; last used ~ 2011 (03/10/12)  . Spinal stenosis of lumbar region   . Chronic kidney disease [facility]     UTI  1 MO AGO TX MEDICALLY   . Osteomyelitis of toe of right foot ~ 2009    amputated 2nd toe  . Hypothyroid   . Glaucoma     Past Surgical History   Procedure Laterality Date  . Elbow bursa surgery  04/07/2006    Right elbow septic olecranon bursitis  . Toe amputation  ~ 2009    Osteomyelitis, right foot second toe /  Right foot second ray amputation  . Cardiac catheterization  07/03/2002    EF 60-70% /  Normal left ventricular function. / Very mild trivial three-vessel coronary atherosclerosis. / There is no mitral regurgitation noted  . Tonsillectomy and adenoidectomy      "when I was a youngster"  . Appendectomy      "when I was a youngster"  . Inguinal hernia repair  ~ 2003/13    right  . Cataract extraction w/ intraocular lens  implant, bilateral  01/2005    Family History  Problem Relation Age of Onset  . Cancer Mother 74    GYN    Social History:  reports that he quit smoking about 48 years ago. His smoking use included Cigarettes. He has a 25 pack-year smoking history. He has quit using smokeless tobacco. He reports that he does not drink alcohol or use illicit drugs.  Allergies: No Known Allergies  Medications: I have reviewed the patient's current medications.  Results for orders placed during the hospital encounter of 11/19/13 (from the past 48 hour(s))  CBC WITH DIFFERENTIAL     Status: Abnormal   Collection Time    11/19/13  3:35 PM  Result Value Ref Range   WBC 7.6  4.0 - 10.5 K/uL   RBC 3.34 (*) 4.22 - 5.81 MIL/uL   Hemoglobin 10.3 (*) 13.0 - 17.0 g/dL   HCT 34.2 (*) 39.0 - 52.0 %   MCV 102.4 (*) 78.0 - 100.0 fL   MCH 30.8  26.0 - 34.0 pg   MCHC 30.1  30.0 - 36.0 g/dL   RDW 15.6 (*) 11.5 - 15.5 %   Platelets 181  150 - 400 K/uL   Neutrophils Relative % 64  43 - 77 %   Neutro Abs 4.8  1.7 - 7.7 K/uL   Lymphocytes Relative 20  12 - 46 %   Lymphs Abs 1.6  0.7 - 4.0 K/uL   Monocytes Relative 13 (*) 3 - 12 %   Monocytes Absolute 1.0  0.1 - 1.0 K/uL   Eosinophils Relative 3  0 - 5 %   Eosinophils Absolute 0.2  0.0 - 0.7 K/uL   Basophils Relative 0  0 - 1 %   Basophils Absolute 0.0  0.0 - 0.1 K/uL   BASIC METABOLIC PANEL     Status: Abnormal   Collection Time    11/19/13  3:35 PM      Result Value Ref Range   Sodium 142  137 - 147 mEq/L   Potassium 5.1  3.7 - 5.3 mEq/L   Chloride 101  96 - 112 mEq/L   CO2 33 (*) 19 - 32 mEq/L   Glucose, Bld 109 (*) 70 - 99 mg/dL   BUN 23  6 - 23 mg/dL   Creatinine, Ser 0.72  0.50 - 1.35 mg/dL   Calcium 9.3  8.4 - 10.5 mg/dL   GFR calc non Af Amer 80 (*) >90 mL/min   GFR calc Af Amer >90  >90 mL/min   Comment: (NOTE)     The eGFR has been calculated using the CKD EPI equation.     This calculation has not been validated in all clinical situations.     eGFR's persistently <90 mL/min signify possible Chronic Kidney     Disease.  PRO B NATRIURETIC PEPTIDE     Status: Abnormal   Collection Time    11/19/13  3:35 PM      Result Value Ref Range   Pro B Natriuretic peptide (BNP) 2774.0 (*) 0 - 450 pg/mL  TROPONIN I     Status: None   Collection Time    11/19/13  3:35 PM      Result Value Ref Range   Troponin I <0.30  <0.30 ng/mL   Comment:            Due to the release kinetics of cTnI,     a negative result within the first hours     of the onset of symptoms does not rule out     myocardial infarction with certainty.     If myocardial infarction is still suspected,     repeat the test at appropriate intervals.  CULTURE, BLOOD (ROUTINE X 2)     Status: None   Collection Time    11/19/13  3:45 PM      Result Value Ref Range   Specimen Description BLOOD LEFT ARM     Special Requests BOTTLES DRAWN AEROBIC ONLY 5CC     Culture  Setup Time       Value: 11/19/2013 19:08     Performed at [facility]  Value:        BLOOD CULTURE RECEIVED NO GROWTH TO DATE CULTURE WILL BE HELD FOR 5 DAYS BEFORE ISSUING A FINAL NEGATIVE REPORT     Performed at [facility]   Report Status PENDING    CULTURE, BLOOD (ROUTINE X 2)     Status: None   Collection Time    11/19/13  3:55 PM      Result Value Ref Range   Specimen  Description BLOOD RIGHT FOREARM     Special Requests BOTTLES DRAWN AEROBIC AND ANAEROBIC 5CC     Culture  Setup Time       Value: 11/19/2013 19:08     Performed at [facility]   Culture       Value:        BLOOD CULTURE RECEIVED NO GROWTH TO DATE CULTURE WILL BE HELD FOR 5 DAYS BEFORE ISSUING A FINAL NEGATIVE REPORT     Performed at [facility]   Report Status PENDING    TSH     Status: Abnormal   Collection Time    11/19/13 10:00 PM      Result Value Ref Range   TSH 0.245 (*) 0.350 - 4.500 uIU/mL   Comment: Please note change in reference range.  URINALYSIS, ROUTINE W REFLEX MICROSCOPIC     Status: Abnormal   Collection Time    11/19/13 11:25 PM      Result Value Ref Range   Color, Urine YELLOW  YELLOW   APPearance TURBID (*) CLEAR   Specific Gravity, Urine 1.022  1.005 - 1.030   pH 6.0  5.0 - 8.0   Glucose, UA NEGATIVE  NEGATIVE mg/dL   Hgb urine dipstick LARGE (*) NEGATIVE   Bilirubin Urine NEGATIVE  NEGATIVE   Ketones, ur NEGATIVE  NEGATIVE mg/dL   Protein, ur 30 (*) NEGATIVE mg/dL   Urobilinogen, UA 0.2  0.0 - 1.0 mg/dL   Nitrite POSITIVE (*) NEGATIVE   Leukocytes, UA LARGE (*) NEGATIVE  URINE MICROSCOPIC-ADD ON     Status: Abnormal   Collection Time    11/19/13 11:25 PM      Result Value Ref Range   WBC, UA TOO NUMEROUS TO COUNT  <3 WBC/hpf   RBC / HPF 11-20  <3 RBC/hpf   Bacteria, UA MANY (*) RARE   Urine-Other LESS THAN 10 mL OF URINE SUBMITTED    URINE CULTURE     Status: None   Collection Time    11/19/13 11:25 PM      Result Value Ref Range   Specimen Description URINE, RANDOM     Special Requests NONE     Culture  Setup Time       Value: 11/20/2013 02:15     Performed at [facility]       Value: >=100,000 COLONIES/ML     Performed at [facility]   Culture       Value: Multiple bacterial morphotypes present, none predominant. Suggest appropriate recollection if clinically indicated.     Performed at  [facility]   Report Status 11/21/2013 FINAL    MRSA PCR SCREENING     Status: Abnormal   Collection Time    11/19/13 11:26 PM      Result Value Ref Range   MRSA by PCR POSITIVE (*) NEGATIVE   Comment:            The GeneXpert MRSA Assay (FDA  approved for NASAL specimens     only), is one component of a     comprehensive MRSA colonization     surveillance program. It is not     intended to diagnose MRSA     infection nor to guide or     monitor treatment for     MRSA infections.     RESULT CALLED TO, READ BACK BY AND VERIFIED WITH:     LEBRON,Y RN 0147 11/20/13 MITCHELL,L  BASIC METABOLIC PANEL     Status: Abnormal   Collection Time    11/20/13  4:51 AM      Result Value Ref Range   Sodium 142  137 - 147 mEq/L   Potassium 4.6  3.7 - 5.3 mEq/L   Chloride 102  96 - 112 mEq/L   CO2 33 (*) 19 - 32 mEq/L   Glucose, Bld 99  70 - 99 mg/dL   BUN 20  6 - 23 mg/dL   Creatinine, Ser 0.61  0.50 - 1.35 mg/dL   Calcium 9.5  8.4 - 10.5 mg/dL   GFR calc non Af Amer 85 (*) >90 mL/min   GFR calc Af Amer >90  >90 mL/min   Comment: (NOTE)     The eGFR has been calculated using the CKD EPI equation.     This calculation has not been validated in all clinical situations.     eGFR's persistently <90 mL/min signify possible Chronic Kidney     Disease.  CBC     Status: Abnormal   Collection Time    11/20/13  4:51 AM      Result Value Ref Range   WBC 6.0  4.0 - 10.5 K/uL   RBC 3.28 (*) 4.22 - 5.81 MIL/uL   Hemoglobin 9.8 (*) 13.0 - 17.0 g/dL   HCT 33.4 (*) 39.0 - 52.0 %   MCV 101.8 (*) 78.0 - 100.0 fL   MCH 29.9  26.0 - 34.0 pg   MCHC 29.3 (*) 30.0 - 36.0 g/dL   RDW 15.5  11.5 - 15.5 %   Platelets 162  150 - 400 K/uL  LIPID PANEL     Status: None   Collection Time    11/20/13  4:51 AM      Result Value Ref Range   Cholesterol 115  0 - 200 mg/dL   Triglycerides 54  <150 mg/dL   HDL 49  >39 mg/dL   Total CHOL/HDL Ratio 2.3     VLDL 11  0 - 40 mg/dL   LDL Cholesterol 55  0  - 99 mg/dL   Comment:            Total Cholesterol/HDL:CHD Risk     Coronary Heart Disease Risk Table                         Men   Women      1/2 Average Risk   3.4   3.3      Average Risk       5.0   4.4      2 X Average Risk   9.6   7.1      3 X Average Risk  23.4   11.0                Use the calculated Patient Ratio     above and the CHD Risk Table     to determine the patient's CHD Risk.  ATP III CLASSIFICATION (LDL):      <100     mg/dL   Optimal      100-129  mg/dL   Near or Above                        Optimal      130-159  mg/dL   Borderline      160-189  mg/dL   High      >190     mg/dL   Very High  BASIC METABOLIC PANEL     Status: Abnormal   Collection Time    11/21/13  3:03 AM      Result Value Ref Range   Sodium 141  137 - 147 mEq/L   Potassium 4.2  3.7 - 5.3 mEq/L   Chloride 98  96 - 112 mEq/L   CO2 33 (*) 19 - 32 mEq/L   Glucose, Bld 87  70 - 99 mg/dL   BUN 23  6 - 23 mg/dL   Creatinine, Ser 0.81  0.50 - 1.35 mg/dL   Calcium 9.3  8.4 - 10.5 mg/dL   GFR calc non Af Amer 76 (*) >90 mL/min   GFR calc Af Amer 88 (*) >90 mL/min   Comment: (NOTE)     The eGFR has been calculated using the CKD EPI equation.     This calculation has not been validated in all clinical situations.     eGFR's persistently <90 mL/min signify possible Chronic Kidney     Disease.    Dg Chest 2 View  11/20/2013   CLINICAL DATA:  CHF  EXAM: CHEST  2 VIEW  COMPARISON:  11/19/2013  FINDINGS: Left subclavian transvenous pacemaker leads project at right atrium and right ventricle.  Enlargement of cardiac silhouette with pulmonary vascular congestion.  Persistent elevation left diaphragm with bibasilar atelectasis.  Mild pulmonary edema consistent with CHF, little changed.  No gross pleural effusion or pneumothorax.  IMPRESSION: Mild CHF with bibasilar atelectasis.   Electronically Signed   By: Lavonia Dana M.D.   On: 11/20/2013 08:15   Dg Chest 2 View  11/19/2013   CLINICAL  DATA:  Cough and shortness of Breath  EXAM: CHEST  2 VIEW  COMPARISON:  07/27/2013  FINDINGS: There is a left chest wall pacer device with lead in the right atrial appendage and right ventricle. Moderate cardiac enlargement is identified. There are moderate volume bilateral pleural effusions and interstitial edema consistent with CHF. Atelectasis is noted in the lung bases.  IMPRESSION: 1. Cardiac enlargement and congestive heart failure.   Electronically Signed   By: Kerby Moors M.D.   On: 11/19/2013 16:54    Review of Systems  Constitutional: Positive for weight loss and malaise/fatigue.  HENT: Negative.   Respiratory: Positive for shortness of breath.   Cardiovascular: Positive for orthopnea and leg swelling.  Neurological: Positive for weakness.   Blood pressure 99/32, pulse 82, temperature 97.8 F (36.6 C), temperature source Oral, resp. rate 20, height _0  (1.88 m), weight 79.1 kg (174 lb 6.1 oz), SpO2 100.00%. Physical Exam  Constitutional: He appears distressed (complaints of SOB at rest).  Very pleasant, elderly male who has been bed ridden since December per his report  HENT:  Head: Normocephalic and atraumatic.  Nose: Nose normal.  Cardiovascular: Normal rate.   Respiratory: He is in respiratory distress (mild to moderate SOB with conversation).  GI: Soft. Bowel sounds are normal. He exhibits no distension. There is  no tenderness.  Neurological: He is alert.  Skin: Skin is warm and dry.  The sacral decubitus ulcer is draining greenish purulent drainage. He does has epibole of the wound edges. Measurements are 4x3x3.5 cm with 4 cm of undermining through out    Assessment/Plan: Stage IV sacral decubitus ulceration- The patient is a poor surgical candidate at this time given multiple medical problems. Would continue local wound care. Excision of the epibole at the bedside would be difficult at the bedside as he is sensate . Would recommend using Dakin's solution for a week to  help with debridement of area and control of purulent drainage. Continue pressure relief surface and encourage protein intake. Could add MVI, Vitamin C and Zinc if no contraindications.   He has been seen in the [facility] in the past for lower extremity wounds and would recommend follow up in the [facility] on Mondays with Dr. Migdalia Dk Phone # [phone].   Zong Mcquarrie,PA-C Plastic Surgery [phone]

## 2013-11-21 NOTE — Progress Notes (Signed)
BP is 99/32. Patient is asymptomatic and is due for lasix. MD is aware of BP. Also made MD aware of lungs sounds upon morning assessment. Wanted him to listen and further assess. Will continue to monitor patient for further changes.

## 2013-11-22 DIAGNOSIS — Z7901 Long term (current) use of anticoagulants: Secondary | ICD-10-CM

## 2013-11-22 DIAGNOSIS — I4891 Unspecified atrial fibrillation: Secondary | ICD-10-CM

## 2013-11-22 DIAGNOSIS — N179 Acute kidney failure, unspecified: Secondary | ICD-10-CM

## 2013-11-22 DIAGNOSIS — I951 Orthostatic hypotension: Secondary | ICD-10-CM

## 2013-11-22 DIAGNOSIS — Z515 Encounter for palliative care: Secondary | ICD-10-CM

## 2013-11-22 DIAGNOSIS — L89109 Pressure ulcer of unspecified part of back, unspecified stage: Secondary | ICD-10-CM

## 2013-11-22 DIAGNOSIS — I509 Heart failure, unspecified: Secondary | ICD-10-CM

## 2013-11-22 LAB — BASIC METABOLIC PANEL
BUN: 23 mg/dL (ref 6–23)
CHLORIDE: 100 meq/L (ref 96–112)
CO2: 35 meq/L — AB (ref 19–32)
Calcium: 9.5 mg/dL (ref 8.4–10.5)
Creatinine, Ser: 0.74 mg/dL (ref 0.50–1.35)
GFR calc Af Amer: >90 mL/min (ref >90)
GFR calc non Af Amer: 79 mL/min — ABNORMAL LOW (ref >90)
Glucose, Bld: 101 mg/dL — ABNORMAL HIGH (ref 70–99)
Potassium: 4.2 mEq/L (ref 3.7–5.3)
SODIUM: 143 meq/L (ref 137–147)

## 2013-11-22 LAB — IRON AND TIBC
IRON: 19 ug/dL — AB (ref 42–135)
Saturation Ratios: 9 % — ABNORMAL LOW (ref 20–55)
TIBC: 222 ug/dL (ref 215–435)
UIBC: 203 ug/dL (ref 125–400)

## 2013-11-22 MED ORDER — FUROSEMIDE 20 MG PO TABS
20.0000 mg | ORAL_TABLET | ORAL | Status: DC
  Administered 2013-11-22: 20 mg via ORAL
  Filled 2013-11-22: qty 1

## 2013-11-22 MED ORDER — HYDROCODONE-ACETAMINOPHEN 5-325 MG PO TABS
1.0000 | ORAL_TABLET | ORAL | Status: DC | PRN
  Administered 2013-11-23 (×2): 1 via ORAL
  Filled 2013-11-22 (×2): qty 1

## 2013-11-22 MED ORDER — LIDOCAINE 5 % EX PTCH
1.0000 | MEDICATED_PATCH | CUTANEOUS | Status: DC
  Administered 2013-11-22 – 2013-11-23 (×2): 1 via TRANSDERMAL
  Filled 2013-11-22 (×2): qty 1

## 2013-11-22 MED ORDER — MORPHINE SULFATE (CONCENTRATE) 10 MG /0.5 ML PO SOLN: 10.0000 mg | ORAL | Status: DC | PRN

## 2013-11-22 NOTE — Progress Notes (Addendum)
Patient EB:RAXENMM NIEKO CLARIN      DOB: 17-Apr-1921      HWK:[id number]  Summary of Goals of care;  Full note to follow;  Met with patient and two daughters.  Family known to me from previous goals of care.  Daughter's intent is to pursue cure for as long as she can.  Her father has some capacity to make decisions for himself, but generally will not go against her wishes.  Daughter needs more information about what long term wound care would look like. States she was told that patient could still go to outpatient wound care and have antibiotics.  Presented these facts to the patient who states that he has "run out of gas".  Daughters starting to wrap their minds around possible end of life situation.  We talked about considering hospice care at home which is the option that they may want to pursue.  Daughter points out that patient has recently been noted to having choking spells which are related to "mucous".  Offered mucolytic but it appears that this was already started.  Family open to evaluation by speech for possible aspiration which may be causing exacerbation of his pulmonary issuses. Will revisit them in the am to update on wound care options after speaking with all parties involved.   Will be meeting with daughters again at 53 am tomorrow to see what they have decided.   Ellwood Steidle L. Lovena Le, MD MBA The Palliative Medicine Team at [facility] Phone: [phone] Pager: [phone]

## 2013-11-22 NOTE — Progress Notes (Signed)
[facility] PROGRESS NOTE Interim History: 80 y.o. male with multiple comorbidities including poor functional status, bedbound, history of tachybradycardia syndrome status post pacemaker implant, atrial fibrillation presently not on anticoagulation, chronic diastolic congestive heart failure, myasthenia gravis, history of frequent falls, stage 3/4 decubitus ulcer, indwelling Foley catheter who is currently a nursing home resident, transferred to the emergency department with complaints of increasing shortness of breath. Patient was diagnosed with upper respiratory tract infection at a skilled nursing facility for which he was treated with Avelox therapy. Despite this intervention he continued to have worsening shortness of breath with symptoms acutely worsening over the past 2-3 days. He has also experienced increasing cough, bilateral extremity pitting edema, orthopnea, brown sputum production and increase in oxygen demand. He denies chest pain, fevers, chills, nausea, vomiting, abdominal pain, recent falls, focal neurological deficits. In the emergency room he was found to have a BNP of 2774 with a chest x-ray showing findings suggestive of congestive heart failure.   Assessment/Plan: Acute resp failure with hypoxia: appears to be secondary to acute on chronic diastolic CHF (congestive heart failure), and bronchitis: - resume  lasix  - good diuresis overnight. - Will continue antibiotics (levaquin) for bronchitis/PNA  - Will start mucinex, pulmicort, flutter valve and PRN oxygen supplementation  - Daily weight, low sodium diet and strict I's and O's. - check a swallowing evaluation.  Chronic Foley catheter use and UA suggesting UTI:  - Significant purulent discharge into foley.  - Will switch to levaquin to cover urinary tract  - Follow urine cx's  - Exchange foley   Decubitus ulcer of sacral region, stage 4: wound care consulted.  - Overlay mattress and plastic surgery  consultation rec poor surgical candidate at this time given multiple medical problems. Would continue local wound care. Would recommend using Dakin's solution for a week to help with debridement of area and control of purulent drainage - pt is bed bound, low chances of ulcer healing at the age of 80 with protein malnutrition and other co-morbidities. - meet with PMT will like to go home on hospice. - Family would like to cont oral antibiotics.  Hypothyroidism:  - continue synthroid  -TSH WNL   Atrial fibrillation: rate controlled and status post PPM-St.Jude placement.  - not an anticoagulation candidate  - cont ASA   Orthostatic hypotension/AI:  - continue midodrine   myasthenia Gravis:  - continue mestinon. Stable.   Severe protein malnutrition:    Code Status: DNR  Family Communication: daughter at bedside  Disposition Plan: SNF at discharge    Consultants:  WOC  Plastic surgery  Procedures: ECHO: last from 11/14  - Left ventricle: The cavity size was normal. Wall thickness was increased in a pattern of mild LVH. The estimated ejection fraction was 55%. Wall motion was normal; there were no regional wall motion abnormalities. Indeterminant diastolic function (atrial fibrillation). - Aortic valve: Trileaflet; moderately calcified leaflets. There was mild stenosis. Trivial regurgitation. Mean gradient: 16mm Hg (S). Peak gradient: 27mm Hg (S). - Aorta: Mildly dilated aortic root. Aortic root dimension: 39mm (ED). - Mitral valve: Mildly calcified annulus. Normal thickness leaflets . No significant regurgitation. - Left atrium: The atrium was moderately dilated. - Right ventricle: The cavity size was mildly dilated. It is not well-visualized, but I think there is a pacemaker in the RV. Systolic function was normal. - Right atrium: The atrium was moderately dilated. - Tricuspid valve: Peak RV-RA gradient:6644mm Hg (S). - Pulmonary arteries: PA peak pressure: 54mm Hg  (  S). - Systemic veins: IVC not visualized.   Antibiotics:  levaquin  HPI/Subjective: Productive cough  Objective: Filed Vitals:   11/21/13 1604 11/21/13 2109 11/22/13 0656 11/22/13 0840  BP: 126/75 94/54 109/63 117/85  Pulse: 84 79 82 107  Temp:  98.7 F (37.1 C) 98 F (36.7 C) 98.5 F (36.9 C)  TempSrc:  Oral Oral Oral  Resp:  20 18 18   Height:      Weight:      SpO2:  98% 97% 91%    Intake/Output Summary (Last 24 hours) at 11/22/13 1244 Last data filed at 11/22/13 1031  Gross per 24 hour  Intake    760 ml  Output   1200 ml  Net   -440 ml   Filed Weights   11/19/13 1526 11/19/13 2026 11/21/13 0420  Weight: 82.555 kg (182 lb) 84.7 kg (186 lb 11.7 oz) 79.1 kg (174 lb 6.1 oz)    Exam:  General: Alert, awake, oriented x3, in no acute distress.  HEENT: No bruits, no goiter. -JVD Heart: Regular rate and rhythm, without murmurs, rubs, gallops.  Lungs: Good air movement, clear Abdomen: Soft, nontender, nondistended, positive bowel sounds.   Data Reviewed: Basic Metabolic Panel:  Recent Labs Lab 11/19/13 1535 11/20/13 0451 11/21/13 0303 11/22/13 0349  NA 142 142 141 143  K 5.1 4.6 4.2 4.2  CL 101 102 98 100  CO2 33* 33* 33* 35*  GLUCOSE 109* 99 87 101*  BUN 23 20 23 23   CREATININE 0.72 0.61 0.81 0.74  CALCIUM 9.3 9.5 9.3 9.5   Liver Function Tests: No results found for this basename: AST, ALT, ALKPHOS, BILITOT, PROT, ALBUMIN,  in the last 168 hours No results found for this basename: LIPASE, AMYLASE,  in the last 168 hours No results found for this basename: AMMONIA,  in the last 168 hours CBC:  Recent Labs Lab 11/19/13 1535 11/20/13 0451  WBC 7.6 6.0  NEUTROABS 4.8  --   HGB 10.3* 9.8*  HCT 34.2* 33.4*  MCV 102.4* 101.8*  PLT 181 162   Cardiac Enzymes:  Recent Labs Lab 11/19/13 1535  TROPONINI <0.30   BNP (last 3 results)  Recent Labs  07/02/13 1612 07/24/13 1549 11/19/13 1535  PROBNP 2389.0* 8586.0* 2774.0*   CBG: No results  found for this basename: GLUCAP,  in the last 168 hours  Recent Results (from the past 240 hour(s))  CULTURE, BLOOD (ROUTINE X 2)     Status: None   Collection Time    11/19/13  3:45 PM      Result Value Ref Range Status   Specimen Description BLOOD LEFT ARM   Final   Special Requests BOTTLES DRAWN AEROBIC ONLY 5CC   Final   Culture  Setup Time     Final   Value: 11/19/2013 19:08     Performed at [facility]   Culture     Final   Value:        BLOOD CULTURE RECEIVED NO GROWTH TO DATE CULTURE WILL BE HELD FOR 5 DAYS BEFORE ISSUING A FINAL NEGATIVE REPORT     Performed at [facility]   Report Status PENDING   Incomplete  CULTURE, BLOOD (ROUTINE X 2)     Status: None   Collection Time    11/19/13  3:55 PM      Result Value Ref Range Status   Specimen Description BLOOD RIGHT FOREARM   Final   Special Requests BOTTLES DRAWN AEROBIC AND ANAEROBIC 5CC  Final   Culture  Setup Time     Final   Value: 11/19/2013 19:08     Performed at [facility]   Culture     Final   Value:        BLOOD CULTURE RECEIVED NO GROWTH TO DATE CULTURE WILL BE HELD FOR 5 DAYS BEFORE ISSUING A FINAL NEGATIVE REPORT     Performed at [facility]   Report Status PENDING   Incomplete  URINE CULTURE     Status: None   Collection Time    11/19/13 11:25 PM      Result Value Ref Range Status   Specimen Description URINE, RANDOM   Final   Special Requests NONE   Final   Culture  Setup Time     Final   Value: 11/20/2013 02:15     Performed at [facility] Count     Final   Value: >=100,000 COLONIES/ML     Performed at [facility]   Culture     Final   Value: Multiple bacterial morphotypes present, none predominant. Suggest appropriate recollection if clinically indicated.     Performed at [facility]   Report Status 11/21/2013 FINAL   Final  MRSA PCR SCREENING     Status: Abnormal   Collection Time    11/19/13 11:26 PM      Result Value Ref  Range Status   MRSA by PCR POSITIVE (*) NEGATIVE Final   Comment:            The GeneXpert MRSA Assay (FDA     approved for NASAL specimens     only), is one component of a     comprehensive MRSA colonization     surveillance program. It is not     intended to diagnose MRSA     infection nor to guide or     monitor treatment for     MRSA infections.     RESULT CALLED TO, READ BACK BY AND VERIFIED WITH:     LEBRON,Y RN 1610 11/20/13 MITCHELL,L     Studies: No results found.  Scheduled Meds: . aspirin  81 mg Oral Daily  . budesonide (PULMICORT) nebulizer solution  0.25 mg Nebulization BID  . Chlorhexidine Gluconate Cloth  6 each Topical Q0600  . dorzolamide-timolol  1 drop Both Eyes Daily  . enoxaparin (LOVENOX) injection  40 mg Subcutaneous Q24H  . feeding supplement (ENSURE)  1 Container Oral BID BM  . feeding supplement (PRO-STAT SUGAR FREE 64)  30 mL Oral TID WC  . finasteride  5 mg Oral Daily  . furosemide  20 mg Oral QODAY  . guaiFENesin  600 mg Oral BID  . levofloxacin  750 mg Oral Daily  . levothyroxine  175 mcg Oral QAC breakfast  . midodrine  10 mg Oral TID WC  . mirtazapine  15 mg Oral QHS  . mupirocin ointment   Nasal BID  . pantoprazole  40 mg Oral Daily  . pyridostigmine  60 mg Oral TID WC  . saccharomyces boulardii  250 mg Oral BID  . sodium chloride  3 mL Intravenous Q12H  . sodium hypochlorite   Irrigation BID   Continuous Infusions:  Time spent with pt from 1:02pm-1:35pm, 3:00pm-4:15pm  Marinda Elk  [facility] Pager [phone]. If 8PM-8AM, please contact night-coverage at [email or web address], password [email or web address] 11/22/2013, 12:44 PM  LOS: 3 days

## 2013-11-22 NOTE — Progress Notes (Addendum)
Patient Marco Gregory      DOB: April 28, 1921      SEG:[id number]  Met with patient and two daughters. Family has decided to go home with hospice care.  Would like to continue a suppressive oral antibiotic regimen, have speech comment on swallowing ability and best safe diet. They will be meeting with personal care service company and we will contact care management via order for offering choice.   Addendum to follow.  Annabeth Tortora L. Lovena Le, MD MBA The Palliative Medicine Team at [facility] Phone: [phone] Pager: [phone]  Patient EV:Marco Gregory      DOB: 1920/10/16      GHW:[id number]   Palliative Medicine Team at [facility] Progress Note    Subjective:  Met with two daughters and patient.  Family now resigned to home with hospice.  Patient slightly confused this am.  [name] concerned about having leg pain, and states "aren't we going to use antibiotics".  Discussed hospice philosophy agreeable to suppressive oral antibiotics for comfort.  Family going to have Personal Care services added to hospice care.  Physical exam: General : brief distress stating legs below the knees hurt, went away by the end of the interview.  Patient able to mount excellent strength exam.  PERRL, EOMI, spider angiomata on face associated with area by the nasal canula, no broken skin Chest :  Decreased but clear, slightly hoarse CVS: irregular, S1, S2 Abd: soft, not tender or distended Ext: warm, trace not pitting edema, good strength Neuro: decreased hearing, slightly confused but usually related to not hearing   Assessment and plan: 80 yr old white male with advanced cardiac disease recurrent heart failure admitted for the same. Course complicated by large decubitus ulcer present on admission which was already treated with outpt wound vac.  Patient has significant autonomic dysfunction and has not been out of the bed since Nov.  1.  DNR  2.  Home with hospice when medically  cleared.  3.  Wound: local care per wound team, consider suppressive palliative oral antibiotics if indicated per family request  4.  Lower limb pain: patient has Roxanol, would decrease to 5 mg dosing however. Also, has norco.   5.  Bowel Movement Last on 4/14, monitor, add senna if no movement.    Total time: 1030 am - 11 15 am   Lauralynn Loeb L. Lovena Le, MD MBA The Palliative Medicine Team at [facility] Phone: [phone] Pager: [phone]

## 2013-11-22 NOTE — Telephone Encounter (Signed)
Left message for Marco Gregory to call back.

## 2013-11-22 NOTE — Progress Notes (Addendum)
Not[facility] by [facility], patient and family request services of [facility] after discharge.  -Spoke with daughters Marco Gregory [phone]) and Marco Gregory [phone])  Initially away from pt to initiate education related to hospice services, philosophy and team approach to care -they voiced good understanding of information provided.  -Spoke later with daughters and pt together- Pt shared concerns related to constipation (no BM in 2 days); back pain (agreeable to trying Lidoderm patch which was previously discussed by daughters with [name], and L eye irritation; Staff RN [name] aware of these concerns and was to follow up with attending as well.  -Per discussion, daughters are attempting to secure private hire caregivers to allow pt to return to his home; daughters were tearful during discussion, they shared their mother recently died in January and since then their dad has not been the same.  They voiced that they are aware their dad  continues to decline; his sacral wound is not going to heal and his heart issues complicate and further compromise his life expectancy-They are hopeful that being able to return to his home will be a source of enjoyment and that they will focus on comfort. -Dtrs discussed with [name] discharge on Saturday by non-emergent transport- hopeful to have secured caregivers and have needed DME in place by end of day Friday *pt/family request Foley catheter be left in place at d/c for comfort *Please send completed GOLD DNR form home with pt  DME needs discussed-  family request the following:  1)Complete pkg D: fully electric hospital bed with AP&P mattress pad, full rails and over-bed table; *add Bed Extender 2) Complete Oxygen Pkg B- pt currently on 4LNC continuous and Nebulizer treatments 3) Hoyer Lift  *Please contact daughter Marco Gregory [phone]) to arrange for DME delivery- home address correct on EPIC facesheet   *family is hopeful for delivery Friday in anticipation of discharge Saturday  [facility]  Please notify [facility] when patient is ready to leave unit at d/c call [phone] on weekend;  [facility] information and contact numbers also given to daughter [name] during visit.   Above information shared with [facility] Please call with any questions or concerns   [name], RN 11/22/2013, 7:52 PM [facility] RN Liaison [phone]

## 2013-11-23 ENCOUNTER — Telehealth: Payer: Self-pay | Admitting: *Deleted

## 2013-11-23 DIAGNOSIS — G7000 Myasthenia gravis without (acute) exacerbation: Secondary | ICD-10-CM

## 2013-11-23 DIAGNOSIS — I509 Heart failure, unspecified: Secondary | ICD-10-CM

## 2013-11-23 MED ORDER — MECLIZINE HCL 25 MG PO TABS: 25.0000 mg | ORAL_TABLET | Freq: Three times a day (TID) | ORAL | Status: AC | PRN

## 2013-11-23 MED ORDER — ALBUTEROL SULFATE 0.63 MG/3ML IN NEBU
1.0000 | INHALATION_SOLUTION | Freq: Four times a day (QID) | RESPIRATORY_TRACT | Status: AC | PRN
Start: 2013-11-23 — End: ?

## 2013-11-23 MED ORDER — LIDOCAINE 5 % EX PTCH: 1.0000 | MEDICATED_PATCH | CUTANEOUS | Status: AC

## 2013-11-23 MED ORDER — FINASTERIDE 5 MG PO TABS: 5.0000 mg | ORAL_TABLET | Freq: Every day | ORAL | Status: AC

## 2013-11-23 MED ORDER — MORPHINE SULFATE (CONCENTRATE) 10 MG /0.5 ML PO SOLN: 5.0000 mg | ORAL | Status: DC | PRN

## 2013-11-23 MED ORDER — BISACODYL 10 MG RE SUPP: 10.0000 mg | Freq: Every day | RECTAL | Status: AC | PRN

## 2013-11-23 MED ORDER — RESOURCE THICKENUP CLEAR PO POWD: 10.0000 g | ORAL | Status: AC | PRN

## 2013-11-23 MED ORDER — PYRIDOSTIGMINE BROMIDE 60 MG PO TABS: 60.0000 mg | ORAL_TABLET | Freq: Three times a day (TID) | ORAL | Status: DC

## 2013-11-23 MED ORDER — DORZOLAMIDE HCL-TIMOLOL MAL 2-0.5 % OP SOLN: 1.0000 [drp] | Freq: Every day | OPHTHALMIC | Status: AC

## 2013-11-23 MED ORDER — MORPHINE SULFATE (CONCENTRATE) 10 MG /0.5 ML PO SOLN: 5.0000 mg | ORAL | Status: AC | PRN

## 2013-11-23 MED ORDER — MIRTAZAPINE 15 MG PO TABS: 15.0000 mg | ORAL_TABLET | Freq: Every day | ORAL | Status: AC

## 2013-11-23 MED ORDER — FUROSEMIDE 20 MG PO TABS: 20.0000 mg | ORAL_TABLET | ORAL | Status: AC

## 2013-11-23 MED ORDER — LEVOTHYROXINE SODIUM 175 MCG PO TABS: 175.0000 ug | ORAL_TABLET | Freq: Every day | ORAL | Status: AC

## 2013-11-23 MED ORDER — ENSURE PUDDING PO PUDG: 1.0000 | Freq: Two times a day (BID) | ORAL | Status: AC

## 2013-11-23 MED ORDER — MIDODRINE HCL 10 MG PO TABS
10.0000 mg | ORAL_TABLET | Freq: Three times a day (TID) | ORAL | Status: AC
Start: 2013-11-23 — End: ?

## 2013-11-23 MED ORDER — LEVOFLOXACIN 750 MG PO TABS: 750.0000 mg | ORAL_TABLET | Freq: Every day | ORAL | Status: AC

## 2013-11-23 MED ORDER — SACCHAROMYCES BOULARDII 250 MG PO CAPS
250.0000 mg | ORAL_CAPSULE | Freq: Two times a day (BID) | ORAL | Status: AC
Start: 2013-11-23 — End: ?

## 2013-11-23 MED ORDER — HYDROCODONE-ACETAMINOPHEN 5-325 MG PO TABS
1.0000 | ORAL_TABLET | ORAL | Status: DC | PRN
Start: 2013-11-23 — End: 2013-11-23

## 2013-11-23 MED ORDER — RESOURCE THICKENUP CLEAR PO POWD
ORAL | Status: DC | PRN
  Filled 2013-11-23: qty 125

## 2013-11-23 NOTE — Discharge Summary (Addendum)
Physician Discharge Summary  Marco PilgrimCharles G Gregory ZOX:[id number] DOB: 10/28/1920 DOA: 11/19/2013  PCP: Elvina SidleLAUENSTEIN,KURT, MD  Admit date: 11/19/2013 Discharge date: 11/23/2013  Time spent: 35 minutes  Recommendations for Outpatient Follow-up:  1. Home with hospice.  Discharge Diagnoses:  Principal Problem:   Acute on chronic diastolic CHF (congestive heart failure), NYHA class 1 Active Problems:   Atrial fibrillation   PPM-St.Jude   Myasthenia gravis   Decubitus ulcer of sacral region, stage 3   Foley catheter in place   Respiratory failure   Discharge Condition: stable  Diet recommendation: regular  Filed Weights   11/19/13 1526 11/19/13 2026 11/21/13 0420  Weight: 82.555 kg (182 lb) 84.7 kg (186 lb 11.7 oz) 79.1 kg (174 lb 6.1 oz)    History of present illness:  80 y.o. male with multiple comorbidities including poor functional status, bedbound, history of tachybradycardia syndrome status post pacemaker implant, atrial fibrillation presently not on anticoagulation, chronic diastolic congestive heart failure, myasthenia gravis, history of frequent falls, stage 3/4 decubitus ulcer, indwelling Foley catheter who is currently a nursing home resident, transferred to the emergency department with complaints of increasing shortness of breath. Patient was diagnosed with upper respiratory tract infection at a skilled nursing facility for which he was treated with Avelox therapy. Despite this intervention he continued to have worsening shortness of breath with symptoms acutely worsening over the past 2-3 days. He has also experienced increasing cough, bilateral extremity pitting edema, orthopnea, brown sputum production and increase in oxygen demand. He denies chest pain, fevers, chills, nausea, vomiting, abdominal pain, recent falls, focal neurological deficits. In the emergency room he was found to have a BNP of 2774 with a chest x-ray showing findings suggestive of congestive heart failure.  He was administered 40 mg of IV Lasix.    Hospital Course:  Acute resp failure with hypoxia: appears to be secondary to acute on chronic diastolic CHF (congestive heart failure), and bronchitis:  - Started on IV lasix with good diuresis. Changed to orals. - Will continue antibiotics (levaquin) for bronchitis/PNA  - Will cont pulmicort, flutter valve and PRN oxygen supplementation  - Daily weight, low sodium diet and strict I's and O's.   Chronic Foley catheter use and UA suggesting UTI:  - Significant purulent discharge into foley.  - Will switch to levaquin to cover urinary tract  - Follow urine cx's  - Exchange foley   Decubitus ulcer of sacral region, stage 4: wound care consulted.  - Overlay mattress and plastic surgery consultation rec poor surgical candidate at this time given multiple medical problems. Would continue local wound care. Would recommend using Dakin's solution for a week to help with debridement of area and control of purulent drainage  - pt is bed bound, low chances of ulcer healing at the age of 80 with protein malnutrition and other co-morbidities.  - Pt and family will like to move toward hospice care focusing more on comfort. - Family would like to cont oral antibiotics. Cont levaquin as an outpatient.  Hypothyroidism:  - continue synthroid  -TSH WNL.  Atrial fibrillation: rate controlled and status post PPM-St.Jude placement.  - Not an anticoagulation candidate  - Cont ASA.   Orthostatic hypotension/AI:  - continue midodrine   Myasthenia Gravis:  - continue mestinon. Stable.   Severe protein malnutrition:    Procedures:  CXR  Consultations:  PMT.  Discharge Exam: Filed Vitals:   11/23/13 0900  BP:   Pulse: 75  Temp:   Resp: 18  General: A&o X3 Cardiovascular: RRR Respiratory: good air movement CTA B/L  Discharge Instructions You were cared for by a hospitalist during your hospital stay. If you have any questions about your  discharge medications or the care you received while you were in the hospital after you are discharged, you can call the unit and asked to speak with the hospitalist on call if the hospitalist that took care of you is not available. Once you are discharged, your primary care physician will handle any further medical issues. Please note that NO REFILLS for any discharge medications will be authorized once you are discharged, as it is imperative that you return to your primary care physician (or establish a relationship with a primary care physician if you do not have one) for your aftercare needs so that they can reassess your need for medications and monitor your lab values.      Discharge Orders   Future Orders Complete By Expires   Diet - low sodium heart healthy  As directed    Increase activity slowly  As directed        Medication List    STOP taking these medications       feeding supplement (PRO-STAT SUGAR FREE 64) Liqd     moxifloxacin 400 MG tablet  Commonly known as:  AVELOX     multivitamin with minerals Tabs tablet      TAKE these medications       albuterol 0.63 MG/3ML nebulizer solution  Commonly known as:  ACCUNEB  Take 1 ampule by nebulization every 6 (six) hours as needed for wheezing.     bisacodyl 10 MG suppository  Commonly known as:  DULCOLAX  Place 10 mg rectally daily as needed for moderate constipation.     dorzolamide-timolol 22.3-6.8 MG/ML ophthalmic solution  Commonly known as:  COSOPT  Place 1 drop into both eyes daily.     feeding supplement (ENSURE) Pudg  Take 1 Container by mouth 2 (two) times daily between meals.     finasteride 5 MG tablet  Commonly known as:  PROSCAR  Take 5 mg by mouth daily.     furosemide 20 MG tablet  Commonly known as:  LASIX  Take 1 tablet (20 mg total) by mouth every other day.     levofloxacin 750 MG tablet  Commonly known as:  LEVAQUIN  Take 1 tablet (750 mg total) by mouth daily.     levothyroxine 175 MCG  tablet  Commonly known as:  SYNTHROID, LEVOTHROID  Take 175 mcg by mouth daily before breakfast.     lidocaine 5 %  Commonly known as:  LIDODERM  Place 1 patch onto the skin daily. Remove & Discard patch within 12 hours or as directed by MD     meclizine 25 MG tablet  Commonly known as:  ANTIVERT  Take 25 mg by mouth 3 (three) times daily as needed for dizziness.     midodrine 10 MG tablet  Commonly known as:  PROAMATINE  Take 10 mg by mouth 3 (three) times daily.     mirtazapine 15 MG tablet  Commonly known as:  REMERON  Take 15 mg by mouth at bedtime.     morphine CONCENTRATE 10 mg / 0.5 ml concentrated solution  Take 0.25 mLs (5 mg total) by mouth every 2 (two) hours as needed for severe pain.     pyridostigmine 60 MG tablet  Commonly known as:  MESTINON  Take 60 mg by mouth 3 (three) times  daily. Breakfast lunch and dinner     saccharomyces boulardii 250 MG capsule  Commonly known as:  FLORASTOR  Take 250 mg by mouth 2 (two) times daily.     TYLENOL EX ST ARTHRITIS PAIN 500 MG tablet  Generic drug:  acetaminophen  Take 500 mg by mouth 3 (three) times daily.       No Known Allergies    The results of significant diagnostics from this hospitalization (including imaging, microbiology, ancillary and laboratory) are listed below for reference.    Significant Diagnostic Studies: Dg Chest 2 View  11/20/2013   CLINICAL DATA:  CHF  EXAM: CHEST  2 VIEW  COMPARISON:  11/19/2013  FINDINGS: Left subclavian transvenous pacemaker leads project at right atrium and right ventricle.  Enlargement of cardiac silhouette with pulmonary vascular congestion.  Persistent elevation left diaphragm with bibasilar atelectasis.  Mild pulmonary edema consistent with CHF, little changed.  No gross pleural effusion or pneumothorax.  IMPRESSION: Mild CHF with bibasilar atelectasis.   Electronically Signed   By: Ulyses Southward M.D.   On: 11/20/2013 08:15   Dg Chest 2 View  11/19/2013   CLINICAL DATA:   Cough and shortness of Breath  EXAM: CHEST  2 VIEW  COMPARISON:  07/27/2013  FINDINGS: There is a left chest wall pacer device with lead in the right atrial appendage and right ventricle. Moderate cardiac enlargement is identified. There are moderate volume bilateral pleural effusions and interstitial edema consistent with CHF. Atelectasis is noted in the lung bases.  IMPRESSION: 1. Cardiac enlargement and congestive heart failure.   Electronically Signed   By: Signa Kell M.D.   On: 11/19/2013 16:54    Microbiology: Recent Results (from the past 240 hour(s))  CULTURE, BLOOD (ROUTINE X 2)     Status: None   Collection Time    11/19/13  3:45 PM      Result Value Ref Range Status   Specimen Description BLOOD LEFT ARM   Final   Special Requests BOTTLES DRAWN AEROBIC ONLY 5CC   Final   Culture  Setup Time     Final   Value: 11/19/2013 19:08     Performed at [facility]   Culture     Final   Value:        BLOOD CULTURE RECEIVED NO GROWTH TO DATE CULTURE WILL BE HELD FOR 5 DAYS BEFORE ISSUING A FINAL NEGATIVE REPORT     Performed at [facility]   Report Status PENDING   Incomplete  CULTURE, BLOOD (ROUTINE X 2)     Status: None   Collection Time    11/19/13  3:55 PM      Result Value Ref Range Status   Specimen Description BLOOD RIGHT FOREARM   Final   Special Requests BOTTLES DRAWN AEROBIC AND ANAEROBIC 5CC   Final   Culture  Setup Time     Final   Value: 11/19/2013 19:08     Performed at [facility]   Culture     Final   Value:        BLOOD CULTURE RECEIVED NO GROWTH TO DATE CULTURE WILL BE HELD FOR 5 DAYS BEFORE ISSUING A FINAL NEGATIVE REPORT     Performed at [facility]   Report Status PENDING   Incomplete  URINE CULTURE     Status: None   Collection Time    11/19/13 11:25 PM      Result Value Ref Range Status   Specimen Description  URINE, RANDOM   Final   Special Requests NONE   Final   Culture  Setup Time     Final   Value: 11/20/2013 02:15      Performed at [facility] Count     Final   Value: >=100,000 COLONIES/ML     Performed at [facility]   Culture     Final   Value: Multiple bacterial morphotypes present, none predominant. Suggest appropriate recollection if clinically indicated.     Performed at [facility]   Report Status 11/21/2013 FINAL   Final  MRSA PCR SCREENING     Status: Abnormal   Collection Time    11/19/13 11:26 PM      Result Value Ref Range Status   MRSA by PCR POSITIVE (*) NEGATIVE Final   Comment:            The GeneXpert MRSA Assay (FDA     approved for NASAL specimens     only), is one component of a     comprehensive MRSA colonization     surveillance program. It is not     intended to diagnose MRSA     infection nor to guide or     monitor treatment for     MRSA infections.     RESULT CALLED TO, READ BACK BY AND VERIFIED WITH:     LEBRON,Y RN 0147 11/20/13 MITCHELL,L     Labs: Basic Metabolic Panel:  Recent Labs Lab 11/19/13 1535 11/20/13 0451 11/21/13 0303 11/22/13 0349  NA 142 142 141 143  K 5.1 4.6 4.2 4.2  CL 101 102 98 100  CO2 33* 33* 33* 35*  GLUCOSE 109* 99 87 101*  BUN 23 20 23 23   CREATININE 0.72 0.61 0.81 0.74  CALCIUM 9.3 9.5 9.3 9.5   Liver Function Tests: No results found for this basename: AST, ALT, ALKPHOS, BILITOT, PROT, ALBUMIN,  in the last 168 hours No results found for this basename: LIPASE, AMYLASE,  in the last 168 hours No results found for this basename: AMMONIA,  in the last 168 hours CBC:  Recent Labs Lab 11/19/13 1535 11/20/13 0451  WBC 7.6 6.0  NEUTROABS 4.8  --   HGB 10.3* 9.8*  HCT 34.2* 33.4*  MCV 102.4* 101.8*  PLT 181 162   Cardiac Enzymes:  Recent Labs Lab 11/19/13 1535  TROPONINI <0.30   BNP: BNP (last 3 results)  Recent Labs  07/02/13 1612 07/24/13 1549 11/19/13 1535  PROBNP 2389.0* 8586.0* 2774.0*   CBG: No results found for this basename: GLUCAP,  in the last 168  hours  Signed:  Marinda Elk  [facility] 11/23/2013, 10:45 AM

## 2013-11-23 NOTE — Telephone Encounter (Signed)
Hospice called back, per Dr L, he agrees to be attending temporarily, but he feels that pt's conditions are too complicated for our practice and asked me to put in a referral to [facility] to become PCP. Advised Hospice of plan and put in referral.

## 2013-11-23 NOTE — Evaluation (Signed)
Clinical/Bedside Swallow Evaluation Patient Details  Name: Marco Gregory MRN: [id number] Date of Birth: 1921/01/15  Today's Date: 11/23/2013 Time: [id number] SLP Time Calculation (min): 41 min  Past Medical History:  Past Medical History  Diagnosis Date  . Long-term (current) use of anticoagulants   . Glaucoma   . Gout   . Hyperthyroidism   . Orthostatic hypotension   . Redundant prepuce and phimosis   . Acute cystitis   . Hypertonicity of bladder   . HTN (hypertension)   . Atelectasis   . Dysautonomia   . Pacemaker- st [employer]     DOI 2012  . CHF (congestive heart failure)     Previous preserved EF.  [name]. Atrial fibrillation   . Sinus node dysfunction   . Pneumonia 08/2011    after hernia OR  . Hypothyroidism   . Sleep apnea     does not use cpap ; last used ~ 2011 (03/10/12)  . Spinal stenosis of lumbar region   . Chronic kidney disease [facility]     UTI  1 MO AGO TX MEDICALLY   . Osteomyelitis of toe of right foot ~ 2009    amputated 2nd toe  . Hypothyroid   . Glaucoma    Past Surgical History:  Past Surgical History  Procedure Laterality Date  . Elbow bursa surgery  04/07/2006    Right elbow septic olecranon bursitis  . Toe amputation  ~ 2009    Osteomyelitis, right foot second toe /  Right foot second ray amputation  . Cardiac catheterization  07/03/2002    EF 60-70% /  Normal left ventricular function. / Very mild trivial three-vessel coronary atherosclerosis. / There is no mitral regurgitation noted  . Tonsillectomy and adenoidectomy      "when I was a youngster"  . Appendectomy      "when I was a youngster"  . Inguinal hernia repair  ~ 2003/13    right  . Cataract extraction w/ intraocular lens  implant, bilateral  01/2005   HPI:  80 y.o. male with multiple comorbidities including poor functional status, bedbound, history of tachybradycardia syndrome status post pacemaker implant, atrial fibrillation presently not on anticoagulation, chronic  diastolic congestive heart failure, myasthenia gravis, history of frequent falls, stage 3/4 decubitus ulcer, indwelling Foley catheter who is currently a nursing home resident, transferred to the emergency department with complaints of increasing shortness of breath. Patient was diagnosed with upper respiratory tract infection at a skilled nursing facility for which he was treated with Avelox therapy. Despite this intervention he continued to have worsening shortness of breath with symptoms acutely worsening over the past 2-3 days. He has also experienced increasing cough, bilateral extremity pitting edema, orthopnea, brown sputum production and increase in oxygen demand. He denies chest pain, fevers, chills, nausea, vomiting, abdominal pain, recent falls, focal neurological deficits. In the emergency room he was found to have a BNP of 2774 with a chest x-ray showing findings suggestive of congestive heart failure   Assessment / Plan / Recommendation Clinical Impression  Patient reports he frequently chokes when drinking liquids.  Patient appears to have a delayed swallow as well as probable decreased laryngeal elevation during the swallow.  There was delayed throat clearing after sips of thin liquid from cup and spoon.  When thickened to nectar, there were no overt s/s of aspiration noted.  Pt. did not seem to mind the thickener, but if he decides he does not want to continue this at home, he is  a DNR, and is to be followed by hospice care.    Aspiration Risk  Moderate    Diet Recommendation Dysphagia 3 (Mechanical Soft);Nectar-thick liquid   Liquid Administration via: Cup;No straw Medication Administration: Whole meds with puree Supervision: Patient able to self feed;Intermittent supervision to cue for compensatory strategies Compensations: Slow rate;Small sips/bites Postural Changes and/or Swallow Maneuvers: Seated upright 90 degrees    Other  Recommendations Oral Care Recommendations: Oral care  BID Other Recommendations: Order thickener from pharmacy;Clarify dietary restrictions   Follow Up Recommendations  24 hour supervision/assistance (Hospice)    Frequency and Duration        Pertinent Vitals/Pain Afebrile; Bilateral rhonchi.         Swallow Study Prior Functional Status       General HPI: 80 y.o. male with multiple comorbidities including poor functional status, bedbound, history of tachybradycardia syndrome status post pacemaker implant, atrial fibrillation presently not on anticoagulation, chronic diastolic congestive heart failure, myasthenia gravis, history of frequent falls, stage 3/4 decubitus ulcer, indwelling Foley catheter who is currently a nursing home resident, transferred to the emergency department with complaints of increasing shortness of breath. Patient was diagnosed with upper respiratory tract infection at a skilled nursing facility for which he was treated with Avelox therapy. Despite this intervention he continued to have worsening shortness of breath with symptoms acutely worsening over the past 2-3 days. He has also experienced increasing cough, bilateral extremity pitting edema, orthopnea, brown sputum production and increase in oxygen demand. He denies chest pain, fevers, chills, nausea, vomiting, abdominal pain, recent falls, focal neurological deficits. In the emergency room he was found to have a BNP of 2774 with a chest x-ray showing findings suggestive of congestive heart failure Type of Study: Bedside swallow evaluation Previous Swallow Assessment: none Diet Prior to this Study: Regular;Thin liquids Temperature Spikes Noted: No Respiratory Status: Room air History of Recent Intubation: No Behavior/Cognition: Alert;Cooperative;Pleasant mood Oral Cavity - Dentition: Adequate natural dentition;Missing dentition (Front tooth recently came out while pt. eatting sandwich) Self-Feeding Abilities: Able to feed self;Needs set up Patient Positioning:  Upright in bed Baseline Vocal Quality: Clear Volitional Cough: Congested Volitional Swallow: Able to elicit    Oral/Motor/Sensory Function Overall Oral Motor/Sensory Function: Appears within functional limits for tasks assessed   Ice Chips Ice chips: Within functional limits Presentation: Spoon   Thin Liquid Thin Liquid: Impaired Presentation: Spoon;Cup;Straw Pharyngeal  Phase Impairments: Suspected delayed Swallow;Decreased hyoid-laryngeal movement;Throat Clearing - Delayed    Nectar Thick Nectar Thick Liquid: Within functional limits Presentation: Cup   Honey Thick Honey Thick Liquid: Not tested   Puree Puree: Within functional limits Presentation: Spoon   Solid   GO    Solid: Within functional limits Presentation: Self Fed       Lenor DerrickLori T Debera Sterba 11/23/2013,2:17 PM

## 2013-11-23 NOTE — Telephone Encounter (Signed)
Hospice calling- They would like to know if you would be the attending physican on this case, and they can treat symptoms as needed. They need an answer today.  [phone] Olegario MessierKathy

## 2013-11-23 NOTE — Progress Notes (Signed)
Follow-up  [facility] to follow aftr d/c -arrangements in place for Assessment RN to see pt at home Saturday 4/18 @ 3pm; if d/c on Sat pls arrange transport for noon or shortly after, so pt will be home for 3 pm visit;  *Pls send completed GOLD DNR form home w/pt; [facility] @ [phone] when finalized; pls contact [facility] @ [phone] when pt is ready to leave unit -Spoke with daughter [name] this morning [facility] and she is still trying to arrange private hire caregivers for the home. Daughter [name] aware of [facility] scheduled RN and will notify [facility] when pt is ready to leave unit at discharge.   Valente DavidMargie Briggette Najarian, [facility] Liaison [phone]

## 2013-11-23 NOTE — Plan of Care (Signed)
Problem: Phase I Progression Outcomes Goal: Voiding-avoid urinary catheter unless indicated Outcome: Not Met (add Reason) Pt. With chronic foley

## 2013-11-23 NOTE — Consult Note (Signed)
WOC reviewed records, plastic surgery has evaluated patient and no further surgical care due to comorbid conditions and current nutritional status.  Pt will discharge to home with Hospice care, will not change wound care orders at this time. 1x day Dakins will work well for hospice care to keep wound moist and control odor from exudate, as this wound will not heal due to current patient status and epibole of wound edges.  Air mattress for home, for comfort and pressure redistribution to hopefully prevent decline in ulcers.    Re consult if needed, will not follow at this time. Thanks  Ngoc Detjen [employer]in RN, CWOCN [phone])

## 2013-11-23 NOTE — Progress Notes (Signed)
Unable to obtain pts. Morning weight. Pts. Bed scale inaccurate.

## 2013-11-23 NOTE — Progress Notes (Signed)
Pt. Discharged from hospital to home via stretcher by [facility] in alert and stable condition. No s/s of distress or discomfort noted upon discharge.

## 2013-11-25 LAB — CULTURE, BLOOD (ROUTINE X 2)
CULTURE: NO GROWTH
Culture: NO GROWTH

## 2013-11-26 ENCOUNTER — Telehealth: Payer: Self-pay

## 2013-11-26 NOTE — Telephone Encounter (Signed)
Patient's nurse returns call.  Wants to know what is recommended for his wound care dressing.  Nurse states that she would recommend three times weekly but that patient's family is stating that it is supposed to be done daily.  Please advise.

## 2013-11-26 NOTE — Telephone Encounter (Signed)
MS ONYEJE IS A NURSE FROM HOSPICE AND SHE WOULD LIKE TO SPEAK WITH DR KURT'S ASST. REGARDING PT. PLEASE CALL [phone]

## 2013-11-26 NOTE — Telephone Encounter (Signed)
LMVM returning call.  Please call back.

## 2013-11-27 NOTE — Telephone Encounter (Signed)
Patient d/c from hospital, no call back from the daughter

## 2013-11-28 NOTE — Telephone Encounter (Signed)
Duplicate message. 

## 2013-11-28 NOTE — Telephone Encounter (Signed)
Onyeje CB stating that current orders on dressing changes are for 5-7 day dressing changes and packing w/calcium alginate. This has been unrealistic for pt's condition because dressings are becoming compromised before that. Esp dressing to sacrum comes off or is contaminated w/stool and needs QD or QOD changes. She reqs QD-QOD changes w/wet - dry dressing on this wound and to change other dressings Q 4 days unless compromised sooner. I authorized this change in orders and discussed w/nurse need for wound care specialist eval.   Spoke w/Joyce, pt's daughter, and advised wounds need to be evaluated. She stated it is very hard to get pt into see doctor. D/W her plan to have [facility] take over PCP care for pt so that they can visit in home if needed. She agreed and will call [facility] back tomorrow and set up appt for visit, and ask them if they will be able to eval the wounds also. Daughter will call me back if referral is needed for [facility]. Notified Onyeje of all of these updates.

## 2013-11-28 NOTE — Consult Note (Signed)
Agree with the above information 

## 2013-12-04 NOTE — Progress Notes (Signed)
After multiple extensive discussions with patient and his daughter- they have elected to go home with private duty care (through [place] care agency) and [place]. Patient and daughter met with Palliative care and options discussed. Patient did not want to return to [facility] and his daughter wants to honor his wishes.  CSW provided support and worked with [facility] to insure safe d/c home via EMS. No further CSW needs identified. CSW signing off. Lorie Phenix. [place], [place]

## 2013-12-11 ENCOUNTER — Telehealth: Payer: Self-pay | Admitting: *Deleted

## 2013-12-11 NOTE — Telephone Encounter (Signed)
FYI.  Patient family is switching attending physician with hospice to a Dr. Isidore Moosffice that provides a Nurse Practioner  to go to the home for care. Mr. Marco Gregory is unable to leave the home for visits anymore.

## 2013-12-12 ENCOUNTER — Other Ambulatory Visit: Payer: Self-pay | Admitting: Cardiology

## 2014-01-07 DEATH — deceased

## 2014-04-24 ENCOUNTER — Encounter: Payer: Self-pay | Admitting: Internal Medicine

## 2014-04-25 ENCOUNTER — Telehealth: Payer: Self-pay | Admitting: Internal Medicine

## 2014-04-25 NOTE — Telephone Encounter (Signed)
04-24-14 sent past due letter, pt needs to send remote/mt

## 2014-04-29 ENCOUNTER — Telehealth: Payer: Self-pay

## 2014-04-29 NOTE — Telephone Encounter (Signed)
Patient died @ Home per [facility] °

## 2014-08-16 IMAGING — CR DG CHEST 1V PORT
1 series · 1 of 1 positions shown · non-contrast
Comparison: 07/24/2013; 07/02/2013; 04/19/2012; chest CT -
04/19/2012

CLINICAL DATA: Decreased oxygen saturation with coarse breath
sounds, right greater than left, weakness

EXAM:
PORTABLE CHEST - 1 VIEW

[AP]
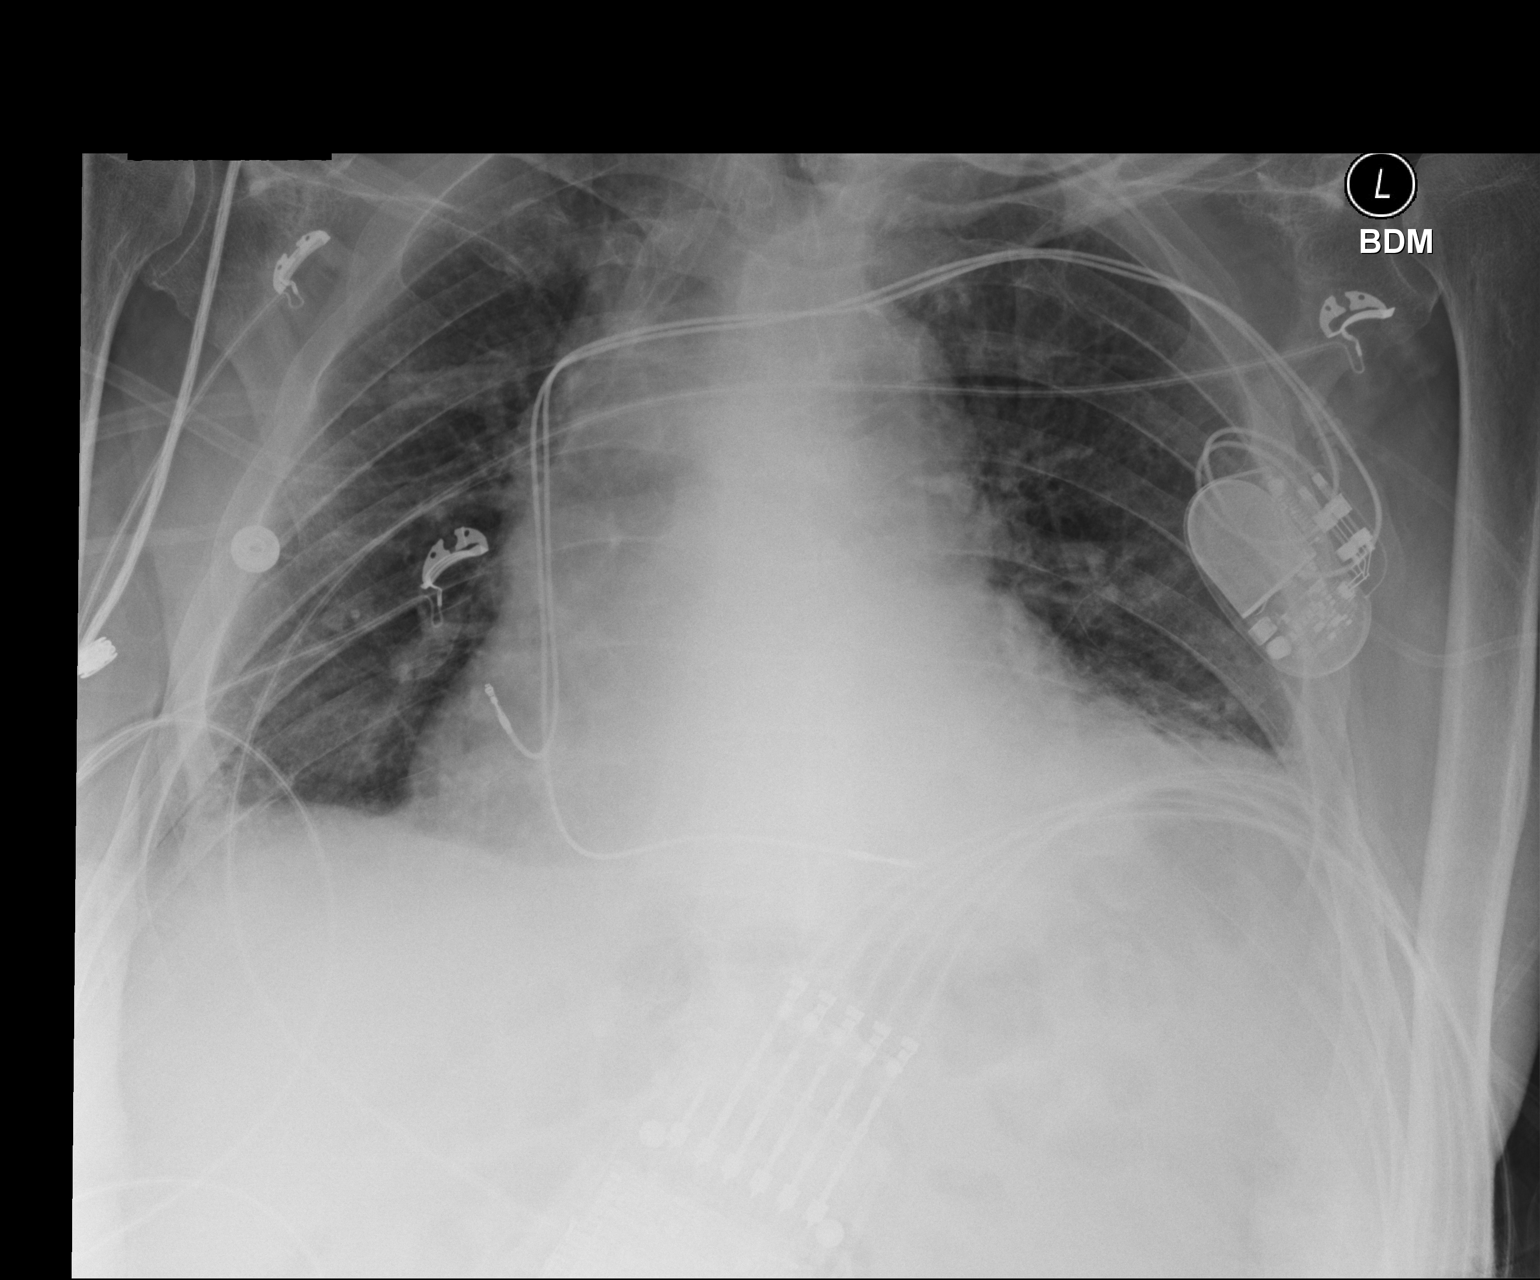

[1 of 1 positions shown; findings below may reference images not displayed]

FINDINGS: Grossly unchanged enlarged cardiac silhouette and mediastinal
contours given persistently reduced lung volumes and patient
rotation. Stable position of support apparatus. Chronic pulmonary
venous congestion without frank evidence of edema. Grossly unchanged
bibasilar heterogeneous opacities, left greater than right. Trace
bilateral effusions are not excluded. No pleural effusion. Grossly
unchanged bones.
IMPRESSION: 1. Persistent findings of hypoventilation and bibasilar opacities,
left greater than right, atelectasis versus infiltrate.
2. Unchanged findings of cardiomegaly and chronic pulmonary venous
congestion. No definite evidence of edema.

## 2014-08-18 IMAGING — CR DG CHEST 1V PORT
1 series · 1 of 1 positions shown · non-contrast
Comparison: 07/25/2013

CLINICAL DATA: Chest pain, shortness of breath

EXAM:
PORTABLE CHEST - 1 VIEW

[AP]
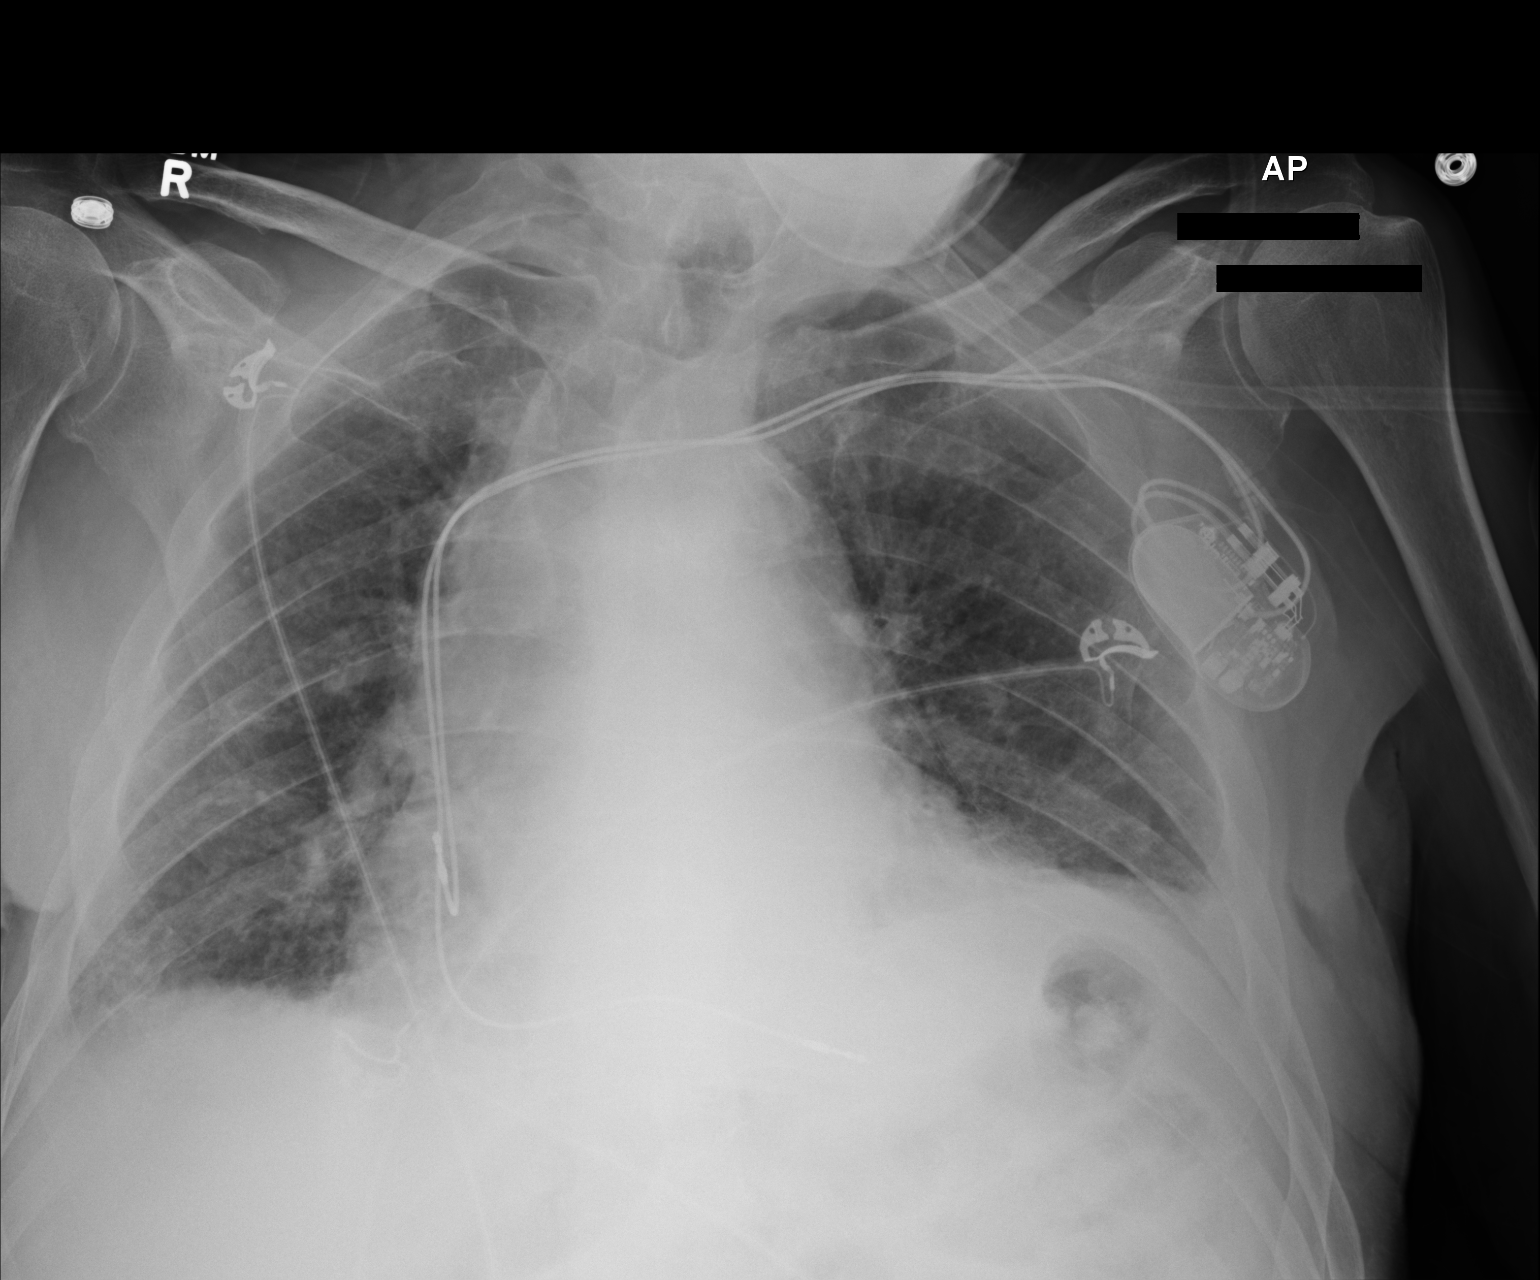

[1 of 1 positions shown; findings below may reference images not displayed]

FINDINGS: There is bilateral mild interstitial thickening. There is elevation
of the left diaphragm. There are trace bilateral pleural effusions.
There is no pneumothorax. Stable cardiomediastinal silhouette. Dual
lead cardiac pacer. Unremarkable osseous structures.
IMPRESSION: Overall findings concerning for mild pulmonary edema.
